# Patient Record
Sex: Male | Born: 1960
Health system: Southern US, Community
[De-identification: ages and names within clinical notes are randomized; demographics above are authoritative.]

## PROBLEM LIST (undated history)

## (undated) DIAGNOSIS — F32A Depression, unspecified: Secondary | ICD-10-CM

## (undated) DIAGNOSIS — K449 Diaphragmatic hernia without obstruction or gangrene: Secondary | ICD-10-CM

## (undated) DIAGNOSIS — N200 Calculus of kidney: Secondary | ICD-10-CM

## (undated) DIAGNOSIS — Z9989 Dependence on other enabling machines and devices: Secondary | ICD-10-CM

## (undated) DIAGNOSIS — K5790 Diverticulosis of intestine, part unspecified, without perforation or abscess without bleeding: Secondary | ICD-10-CM

## (undated) DIAGNOSIS — K219 Gastro-esophageal reflux disease without esophagitis: Secondary | ICD-10-CM

## (undated) DIAGNOSIS — D500 Iron deficiency anemia secondary to blood loss (chronic): Secondary | ICD-10-CM

## (undated) DIAGNOSIS — T753XXA Motion sickness, initial encounter: Secondary | ICD-10-CM

## (undated) DIAGNOSIS — Z973 Presence of spectacles and contact lenses: Secondary | ICD-10-CM

## (undated) DIAGNOSIS — Z860100 Personal history of colon polyps, unspecified: Secondary | ICD-10-CM

## (undated) DIAGNOSIS — K648 Other hemorrhoids: Secondary | ICD-10-CM

## (undated) DIAGNOSIS — G4733 Obstructive sleep apnea (adult) (pediatric): Secondary | ICD-10-CM

## (undated) DIAGNOSIS — F411 Generalized anxiety disorder: Secondary | ICD-10-CM

## (undated) DIAGNOSIS — I10 Essential (primary) hypertension: Secondary | ICD-10-CM

## (undated) DIAGNOSIS — I2510 Atherosclerotic heart disease of native coronary artery without angina pectoris: Secondary | ICD-10-CM

## (undated) DIAGNOSIS — IMO0002 Reserved for concepts with insufficient information to code with codable children: Secondary | ICD-10-CM

## (undated) DIAGNOSIS — N529 Male erectile dysfunction, unspecified: Secondary | ICD-10-CM

## (undated) DIAGNOSIS — E119 Type 2 diabetes mellitus without complications: Secondary | ICD-10-CM

## (undated) DIAGNOSIS — K2270 Barrett's esophagus without dysplasia: Secondary | ICD-10-CM

## (undated) DIAGNOSIS — E785 Hyperlipidemia, unspecified: Secondary | ICD-10-CM

## (undated) DIAGNOSIS — M1990 Unspecified osteoarthritis, unspecified site: Secondary | ICD-10-CM

## (undated) DIAGNOSIS — F329 Major depressive disorder, single episode, unspecified: Secondary | ICD-10-CM

## (undated) HISTORY — DX: Iron deficiency anemia secondary to blood loss (chronic): D50.0

## (undated) HISTORY — DX: Other hemorrhoids: K64.8

## (undated) HISTORY — DX: Depression, unspecified: F32.A

## (undated) HISTORY — DX: Barrett's esophagus without dysplasia: K22.70

## (undated) HISTORY — DX: Reserved for concepts with insufficient information to code with codable children: IMO0002

## (undated) HISTORY — DX: Essential (primary) hypertension: I10

## (undated) HISTORY — DX: Diverticulosis of intestine, part unspecified, without perforation or abscess without bleeding: K57.90

## (undated) HISTORY — PX: CARDIAC CATHETERIZATION: SHX172

## (undated) HISTORY — DX: Obstructive sleep apnea (adult) (pediatric): G47.33

## (undated) HISTORY — DX: Dependence on other enabling machines and devices: Z99.89

## (undated) HISTORY — PX: WISDOM TOOTH EXTRACTION: SHX21

## (undated) HISTORY — DX: Generalized anxiety disorder: F41.1

## (undated) HISTORY — DX: Diaphragmatic hernia without obstruction or gangrene: K44.9

## (undated) HISTORY — DX: Major depressive disorder, single episode, unspecified: F32.9

## (undated) HISTORY — DX: Personal history of colon polyps, unspecified: Z86.0100

## (undated) HISTORY — DX: Calculus of kidney: N20.0

## (undated) HISTORY — DX: Personal history of colonic polyps: Z86.010

## (undated) HISTORY — DX: Male erectile dysfunction, unspecified: N52.9

## (undated) HISTORY — DX: Unspecified osteoarthritis, unspecified site: M19.90

---

## 1995-09-16 HISTORY — PX: OTHER SURGICAL HISTORY: SHX169

## 2005-06-12 ENCOUNTER — Ambulatory Visit: Payer: Self-pay

## 2007-09-16 HISTORY — PX: COLONOSCOPY W/ POLYPECTOMY: SHX1380

## 2007-09-16 LAB — HM COLONOSCOPY

## 2008-10-05 ENCOUNTER — Ambulatory Visit: Payer: Self-pay | Admitting: Gastroenterology

## 2008-10-27 ENCOUNTER — Ambulatory Visit: Payer: Self-pay | Admitting: Family Medicine

## 2009-08-02 ENCOUNTER — Ambulatory Visit: Payer: Self-pay | Admitting: Family Medicine

## 2009-09-15 HISTORY — PX: OTHER SURGICAL HISTORY: SHX169

## 2009-11-20 ENCOUNTER — Ambulatory Visit: Payer: Self-pay | Admitting: Family Medicine

## 2010-05-06 ENCOUNTER — Ambulatory Visit: Payer: Self-pay

## 2010-05-17 ENCOUNTER — Ambulatory Visit: Payer: Self-pay | Admitting: Unknown Physician Specialty

## 2010-05-24 ENCOUNTER — Ambulatory Visit: Payer: Self-pay | Admitting: Unknown Physician Specialty

## 2010-08-05 ENCOUNTER — Ambulatory Visit: Payer: Self-pay

## 2011-09-16 LAB — HM DIABETES EYE EXAM

## 2011-12-30 ENCOUNTER — Other Ambulatory Visit: Payer: Self-pay | Admitting: Cardiovascular Disease

## 2012-01-01 ENCOUNTER — Other Ambulatory Visit: Payer: Self-pay | Admitting: Cardiovascular Disease

## 2012-01-01 ENCOUNTER — Encounter (HOSPITAL_COMMUNITY): Payer: Self-pay | Admitting: Pharmacy Technician

## 2012-01-02 ENCOUNTER — Inpatient Hospital Stay (HOSPITAL_COMMUNITY): Payer: BC Managed Care – PPO

## 2012-01-02 ENCOUNTER — Other Ambulatory Visit: Payer: Self-pay

## 2012-01-02 ENCOUNTER — Encounter (HOSPITAL_COMMUNITY): Payer: BC Managed Care – PPO

## 2012-01-02 ENCOUNTER — Encounter (HOSPITAL_COMMUNITY): Payer: Self-pay | Admitting: Cardiology

## 2012-01-02 ENCOUNTER — Encounter (HOSPITAL_COMMUNITY)
Admission: RE | Disposition: A | Discharge status: Home / Self Care | Payer: Self-pay | Source: Ambulatory Visit | Attending: Thoracic Surgery (Cardiothoracic Vascular Surgery)

## 2012-01-02 ENCOUNTER — Inpatient Hospital Stay (HOSPITAL_COMMUNITY)
Admission: RE | Admit: 2012-01-02 | Discharge: 2012-01-09 | DRG: 107 | Disposition: A | Discharge status: Home / Self Care | Payer: BC Managed Care – PPO | Source: Ambulatory Visit | Attending: Thoracic Surgery (Cardiothoracic Vascular Surgery) | Admitting: Thoracic Surgery (Cardiothoracic Vascular Surgery)

## 2012-01-02 DIAGNOSIS — Z87891 Personal history of nicotine dependence: Secondary | ICD-10-CM

## 2012-01-02 DIAGNOSIS — I200 Unstable angina: Secondary | ICD-10-CM | POA: Diagnosis present

## 2012-01-02 DIAGNOSIS — IMO0001 Reserved for inherently not codable concepts without codable children: Secondary | ICD-10-CM | POA: Diagnosis present

## 2012-01-02 DIAGNOSIS — I2510 Atherosclerotic heart disease of native coronary artery without angina pectoris: Secondary | ICD-10-CM

## 2012-01-02 DIAGNOSIS — D62 Acute posthemorrhagic anemia: Secondary | ICD-10-CM | POA: Diagnosis not present

## 2012-01-02 DIAGNOSIS — J9819 Other pulmonary collapse: Secondary | ICD-10-CM | POA: Diagnosis present

## 2012-01-02 DIAGNOSIS — E785 Hyperlipidemia, unspecified: Secondary | ICD-10-CM | POA: Diagnosis present

## 2012-01-02 DIAGNOSIS — Z01810 Encounter for preprocedural cardiovascular examination: Secondary | ICD-10-CM

## 2012-01-02 DIAGNOSIS — Z951 Presence of aortocoronary bypass graft: Secondary | ICD-10-CM

## 2012-01-02 DIAGNOSIS — E119 Type 2 diabetes mellitus without complications: Secondary | ICD-10-CM | POA: Diagnosis present

## 2012-01-02 DIAGNOSIS — R9439 Abnormal result of other cardiovascular function study: Secondary | ICD-10-CM | POA: Diagnosis present

## 2012-01-02 HISTORY — DX: Hyperlipidemia, unspecified: E78.5

## 2012-01-02 HISTORY — DX: Gastro-esophageal reflux disease without esophagitis: K21.9

## 2012-01-02 HISTORY — PX: LEFT HEART CATHETERIZATION WITH CORONARY ANGIOGRAM: SHX5451

## 2012-01-02 HISTORY — DX: Type 2 diabetes mellitus without complications: E11.9

## 2012-01-02 HISTORY — DX: Atherosclerotic heart disease of native coronary artery without angina pectoris: I25.10

## 2012-01-02 LAB — PULMONARY FUNCTION TEST

## 2012-01-02 LAB — HEPARIN LEVEL (UNFRACTIONATED): Heparin Unfractionated: 0.11 IU/mL — ABNORMAL LOW (ref 0.30–0.70)

## 2012-01-02 LAB — CBC
Hemoglobin: 11.7 g/dL — ABNORMAL LOW (ref 13.0–17.0)
RBC: 4.95 MIL/uL (ref 4.22–5.81)

## 2012-01-02 LAB — BASIC METABOLIC PANEL
CO2: 25 mEq/L (ref 19–32)
Chloride: 101 mEq/L (ref 96–112)
Glucose, Bld: 134 mg/dL — ABNORMAL HIGH (ref 70–99)
Potassium: 4.5 mEq/L (ref 3.5–5.1)
Sodium: 138 mEq/L (ref 135–145)

## 2012-01-02 LAB — GLUCOSE, CAPILLARY: Glucose-Capillary: 121 mg/dL — ABNORMAL HIGH (ref 70–99)

## 2012-01-02 LAB — PROTIME-INR: INR: 0.98 (ref 0.00–1.49)

## 2012-01-02 SURGERY — LEFT HEART CATHETERIZATION WITH CORONARY ANGIOGRAM
Anesthesia: LOCAL

## 2012-01-02 MED ORDER — PANTOPRAZOLE SODIUM 40 MG PO TBEC
40.0000 mg | DELAYED_RELEASE_TABLET | Freq: Every day | ORAL | Status: DC
  Administered 2012-01-02 – 2012-01-04 (×3): 40 mg via ORAL
  Filled 2012-01-02 (×2): qty 1

## 2012-01-02 MED ORDER — VENLAFAXINE HCL ER 150 MG PO CP24
150.0000 mg | ORAL_CAPSULE | Freq: Every day | ORAL | Status: DC
  Administered 2012-01-02 – 2012-01-04 (×3): 150 mg via ORAL
  Filled 2012-01-02 (×4): qty 1

## 2012-01-02 MED ORDER — DIAZEPAM 5 MG PO TABS
5.0000 mg | ORAL_TABLET | ORAL | Status: AC
  Administered 2012-01-02: 5 mg via ORAL
  Filled 2012-01-02: qty 1

## 2012-01-02 MED ORDER — ASPIRIN EC 325 MG PO TBEC
325.0000 mg | DELAYED_RELEASE_TABLET | Freq: Every day | ORAL | Status: DC
  Administered 2012-01-02 – 2012-01-04 (×3): 325 mg via ORAL
  Filled 2012-01-02 (×3): qty 1

## 2012-01-02 MED ORDER — FENTANYL CITRATE 0.05 MG/ML IJ SOLN
INTRAMUSCULAR | Status: AC
  Filled 2012-01-02: qty 2

## 2012-01-02 MED ORDER — ATORVASTATIN CALCIUM 40 MG PO TABS
40.0000 mg | ORAL_TABLET | Freq: Every day | ORAL | Status: DC
  Filled 2012-01-02: qty 1

## 2012-01-02 MED ORDER — ISOSORBIDE MONONITRATE ER 30 MG PO TB24
30.0000 mg | ORAL_TABLET | Freq: Every day | ORAL | Status: DC
  Administered 2012-01-02: 30 mg via ORAL
  Filled 2012-01-02 (×2): qty 1

## 2012-01-02 MED ORDER — HEPARIN (PORCINE) IN NACL 2-0.9 UNIT/ML-% IJ SOLN
INTRAMUSCULAR | Status: AC
  Filled 2012-01-02: qty 2000

## 2012-01-02 MED ORDER — CLONAZEPAM 1 MG PO TABS
1.0000 mg | ORAL_TABLET | Freq: Two times a day (BID) | ORAL | Status: DC
  Administered 2012-01-02 – 2012-01-04 (×6): 1 mg via ORAL
  Filled 2012-01-02 (×6): qty 1

## 2012-01-02 MED ORDER — LIDOCAINE HCL (PF) 1 % IJ SOLN
INTRAMUSCULAR | Status: AC
  Filled 2012-01-02: qty 30

## 2012-01-02 MED ORDER — ACETAMINOPHEN 325 MG PO TABS: 650.0000 mg | ORAL_TABLET | ORAL | Status: DC | PRN

## 2012-01-02 MED ORDER — METFORMIN HCL 500 MG PO TABS: 1000.0000 mg | ORAL_TABLET | Freq: Two times a day (BID) | ORAL | Status: DC

## 2012-01-02 MED ORDER — ROSUVASTATIN CALCIUM 20 MG PO TABS
20.0000 mg | ORAL_TABLET | Freq: Every day | ORAL | Status: DC
  Administered 2012-01-02: 20 mg via ORAL
  Filled 2012-01-02 (×2): qty 1

## 2012-01-02 MED ORDER — ALBUTEROL SULFATE (5 MG/ML) 0.5% IN NEBU
2.5000 mg | INHALATION_SOLUTION | Freq: Once | RESPIRATORY_TRACT | Status: AC
  Administered 2012-01-02: 2.5 mg via RESPIRATORY_TRACT

## 2012-01-02 MED ORDER — METOPROLOL SUCCINATE ER 25 MG PO TB24
25.0000 mg | ORAL_TABLET | Freq: Every day | ORAL | Status: DC
  Administered 2012-01-02: 25 mg via ORAL
  Filled 2012-01-02 (×2): qty 1

## 2012-01-02 MED ORDER — MIDAZOLAM HCL 2 MG/2ML IJ SOLN
INTRAMUSCULAR | Status: AC
  Filled 2012-01-02: qty 2

## 2012-01-02 MED ORDER — ASPIRIN 81 MG PO CHEW
324.0000 mg | CHEWABLE_TABLET | ORAL | Status: AC
  Administered 2012-01-02: 324 mg via ORAL
  Filled 2012-01-02: qty 4

## 2012-01-02 MED ORDER — INSULIN ASPART 100 UNIT/ML ~~LOC~~ SOLN
0.0000 [IU] | Freq: Three times a day (TID) | SUBCUTANEOUS | Status: DC
Start: 2012-01-02 — End: 2012-01-05
  Administered 2012-01-03: 1 [IU] via SUBCUTANEOUS

## 2012-01-02 MED ORDER — SODIUM CHLORIDE 0.9 % IV SOLN: INTRAVENOUS | Status: DC

## 2012-01-02 MED ORDER — ONDANSETRON HCL 4 MG/2ML IJ SOLN: 4.0000 mg | Freq: Four times a day (QID) | INTRAMUSCULAR | Status: DC | PRN

## 2012-01-02 MED ORDER — METOPROLOL TARTRATE 25 MG PO TABS: 25.0000 mg | ORAL_TABLET | Freq: Two times a day (BID) | ORAL | Status: DC

## 2012-01-02 MED ORDER — HEPARIN (PORCINE) IN NACL 100-0.45 UNIT/ML-% IJ SOLN
1400.0000 [IU]/h | INTRAMUSCULAR | Status: AC
  Administered 2012-01-02: 1000 [IU]/h via INTRAVENOUS
  Administered 2012-01-03: 1300 [IU]/h via INTRAVENOUS
  Administered 2012-01-03 – 2012-01-04 (×2): 1400 [IU]/h via INTRAVENOUS
  Filled 2012-01-02 (×4): qty 250

## 2012-01-02 MED ORDER — SODIUM CHLORIDE 0.9 % IV SOLN: 250.0000 mL | INTRAVENOUS | Status: DC | PRN

## 2012-01-02 MED ORDER — SODIUM CHLORIDE 0.9 % IJ SOLN: 3.0000 mL | INTRAMUSCULAR | Status: DC | PRN

## 2012-01-02 MED ORDER — SODIUM CHLORIDE 0.9 % IJ SOLN: 3.0000 mL | Freq: Two times a day (BID) | INTRAMUSCULAR | Status: DC

## 2012-01-02 MED ORDER — NITROGLYCERIN 0.2 MG/ML ON CALL CATH LAB
INTRAVENOUS | Status: AC
  Filled 2012-01-02: qty 1

## 2012-01-02 MED ORDER — SODIUM CHLORIDE 0.9 % IV SOLN
INTRAVENOUS | Status: DC
  Administered 2012-01-02: 08:00:00 via INTRAVENOUS

## 2012-01-02 MED ORDER — VITAMIN D3 25 MCG (1000 UNIT) PO TABS
1000.0000 [IU] | ORAL_TABLET | Freq: Every day | ORAL | Status: DC
  Administered 2012-01-02 – 2012-01-04 (×3): 1000 [IU] via ORAL
  Filled 2012-01-02 (×4): qty 1

## 2012-01-02 MED ORDER — GEMFIBROZIL 600 MG PO TABS
600.0000 mg | ORAL_TABLET | Freq: Two times a day (BID) | ORAL | Status: DC
  Administered 2012-01-02 – 2012-01-03 (×2): 600 mg via ORAL
  Filled 2012-01-02 (×4): qty 1

## 2012-01-02 NOTE — Cardiovascular Report (Signed)
NAMEMarland Kitchen  Joseph Terry, Joseph Terry NO.:  [id number]  MEDICAL RECORD NO.:  [id number]  LOCATION:  3708                         FACILITY:  [facility]  PHYSICIAN:  Nicki Guadalajara, M.D.     DATE OF BIRTH:  05/12/1961  DATE OF PROCEDURE: DATE OF DISCHARGE:                           CARDIAC CATHETERIZATION   INDICATIONS:  Joseph Terry is a 51 year old gentleman who has a long-standing tobacco history.  He had recently developed exertional neck discomfort.  He underwent a nuclear perfusion study, which was abnormal with the development of 3-4 mm of ST-segment depression in inferior ischemia.  He has been evaluated by Dr. Alanda Amass and scheduled for cardiac catheterization today.  PROCEDURE:  After premedication with Versed 2 mg plus fentanyl 50 mcg, the patient was prepped and draped in usual fashion.  His right femoral artery is punctured anteriorly and a 5-French sheath was inserted. Diagnostic cardiac catheterization was done utilizing 5-French Judkins 4 left and right coronary catheters.  The right catheter was used for selective angiography into the left subclavian internal mammary artery since the patient will need CABG surgery.  A 5-French pigtail catheter was used for RAO ventriculography as well as distal aortography. Hemostasis was obtained by direct manual pressure.  He tolerated the procedure well.  HEMODYNAMIC DATA:  Central aortic pressure 115/60, left ventricle pressure 115/6.  ANGIOGRAPHIC DATA:  The coronary arteries were extensively calcified.  The left main coronary artery had 30% distal narrowing and trifurcated into an LAD, a small intermediate vessel and left circumflex system.  The ostium of the LAD had tubular 70% stenosis arising from the left main.  After the small first diagonal vessel, there was 70% narrowing followed by an aneurysmal dilatation of the LAD proximal to the 2nd diagonal vessel.  The 2nd diagonal vessel had 95% proximal stenosis  and the LAD was aneurysmal just after this diagonal takeoff in the region of the septal perforating artery.  There was 60-70% narrowing in the LAD beyond the 2nd diagonal vessel as well as 60% narrowing after the 3rd diagonal vessel in the mid LAD.  The distal LAD wraps around the apex.  The intermediate vessel had 80% near ostial stenosis.  There was a small caliber.  The circumflex vessel had tubular 50-60% proximal stenosis.  There was 50% stenosis in the OM-1 vessel.  The right coronary artery was a large caliber dominant vessel that was calcified.  There was 95% proximal stenosis on the proximal bend.  There was then diffuse 80% stenosis in the proximal to mid segment followed 90% mid stenosis.  There was 80-90% acute marginal stenosis proximally followed by 50% distal RCA stenosis before the distal branches.  The left subclavian internal mammary artery were normal and suitable for CABG surgery.  Left ventriculography revealed left ventricular hypertrophy with normal systolic function without focal segmental wall motion abnormalities.  Distal aortography revealed patent renal arteries without significant aortoiliac disease.  IMPRESSION: 1. Mild left ventricular hypertrophy with normal systolic function     without focal segmental wall motion abnormality and ejection     fraction of 55%. 2. Significant coronary calcification with severe multivessel CAD with     30% distal left main  stenosis, 70% tubular ostial LAD stenosis with     aneurysmal dilatation of the LAD proximal and distal to the 2nd     diagonal vessel with 70% stenoses, the 2nd diagonal vessel had 95%     stenosis.  The mid LAD had 60-70% stenosis, and after the 3rd     diagonal branch, had another diffuse area of 60% stenosis.  Next,     80% ostial ramus intermedius stenosis.  Next, 50-60% proximal     circumflex stenosis with 50% OM-1 stenosis.  Next, 95% proximal     right coronary artery stenosis with  diffuse 80% proximal to mid     stenosis followed by 90% mid stenosis with 80-90% stenosis in the     origin of acute marginal PDA-like branch followed by 50% distal RCA     stenosis.  Next, normal aortoiliac system.  RECOMMENDATION:  CABG revascularization surgery.          ______________________________ Nicki Guadalajara, M.D.     TK/MEDQ  D:  01/02/2012  T:  01/02/2012  Job:  [id number]  cc:   Gerlene Burdock A. Alanda Amass, M.D. Nilda Simmer, M.D.

## 2012-01-02 NOTE — Consult Note (Signed)
CARDIOTHORACIC SURGERY CONSULTATION REPORT  PCP is SMITH,KRISTI, MD, MD Referring Provider is [name], MD Primary Cardiologist is Susa Griffins, MD   Reason for consultation:  Severe 3-vessel CAD  HPI:  Patient is a 51 year old divorced white male from [place] with no previous history of coronary artery disease but risk factors notable for history of type 2 diabetes mellitus, hyperlipidemia, and long-standing tobacco abuse. The patient states that over the last 2 or 3 years he's noticed some mild progression of fatigue and decreased exercise tolerance. Last summer during hot weather he noted that he would get some vague discomfort in the left side of his neck when he was out mowing the yard. He also has noted some very mild exertional shortness of breath. He's never had any significant chest pain. He discussed these findings with his primary care physician and was referred for a stress test and cardiology evaluation. He saw Dr. Alanda Amass in the office and underwent nuclear stress test that was felt to be high risk for ischemia. The patient was brought in for elective cardiac catheterization today by Dr. Tresa Endo. This reveals severe three-vessel coronary artery disease with normal left ventricular function. The patient was admitted to the hospital following catheterization and her thoracic surgical consultation was requested.  Past Medical History  Diagnosis Date  . Unstable angina 01/02/2012  . Cardiovascular stress test abnormal 01/02/2012  . CAD (coronary artery disease), severe multivessel CAD surgical consult for CABG 01/02/2012  . Type II diabetes mellitus   . Hyperlipidemia   . Tobacco abuse     Past Surgical History  Procedure Date  . Shoulder surgery     No family history on file.  Social History History  Substance Use Topics  . Smoking status: Former Smoker -- 1.5 packs/day for 35 years    Types: Cigarettes    Quit date: 11/14/2011  . Smokeless  tobacco: Not on file  . Alcohol Use: Not on file    Prior to Admission medications   Medication Sig Start Date End Date Taking? Authorizing Provider  cholecalciferol (VITAMIN D) 1000 UNITS tablet Take 1,000 Units by mouth daily.   Yes Historical Provider, MD  clonazePAM (KLONOPIN) 1 MG tablet Take 1 mg by mouth 2 (two) times daily.   Yes Historical Provider, MD  esomeprazole (NEXIUM) 40 MG capsule Take 40 mg by mouth daily before breakfast.   Yes Historical Provider, MD  gemfibrozil (LOPID) 600 MG tablet Take 600 mg by mouth 2 (two) times daily before a meal.   Yes Historical Provider, MD  metFORMIN (GLUCOPHAGE) 1000 MG tablet Take 1,000 mg by mouth 2 (two) times daily with a meal.   Yes Historical Provider, MD  pravastatin (PRAVACHOL) 40 MG tablet Take 40 mg by mouth daily.   Yes Historical Provider, MD  venlafaxine XR (EFFEXOR-XR) 150 MG 24 hr capsule Take 150 mg by mouth daily.   Yes Historical Provider, MD  metoprolol succinate (TOPROL-XL) 25 MG 24 hr tablet Take 25 mg by mouth daily.    Historical Provider, MD    Current Facility-Administered Medications  Medication Dose Route Frequency Provider Last Rate Last Dose  . 0.9 %  sodium chloride infusion   Intravenous Continuous [name], MD 150 mL/hr at 01/02/12 1045    . acetaminophen (TYLENOL) tablet 650 mg  650 mg Oral Q4H PRN [name], MD      . albuterol (PROVENTIL) (5 MG/ML) 0.5% nebulizer solution 2.5 mg  2.5  mg Nebulization Once Purcell Nails, MD   2.5 mg at 01/02/12 1538  . aspirin chewable tablet 324 mg  324 mg Oral Pre-Cath [name], MD   324 mg at 01/02/12 0824  . aspirin EC tablet 325 mg  325 mg Oral Daily [name], MD   325 mg at 01/02/12 1130  . cholecalciferol (VITAMIN D) tablet 1,000 Units  1,000 Units Oral Daily [name], MD   1,000 Units at 01/02/12 1130  . clonazePAM (KLONOPIN) tablet 1 mg  1 mg Oral BID [name], MD   1 mg at 01/02/12 1130  . diazepam (VALIUM) tablet 5 mg  5 mg Oral  On Call [name], MD   5 mg at 01/02/12 0824  . fentaNYL (SUBLIMAZE) 0.05 MG/ML injection           . gemfibrozil (LOPID) tablet 600 mg  600 mg Oral BID AC [name], MD   600 mg at 01/02/12 1704  . heparin 2-0.9 UNIT/ML-% infusion           . heparin ADULT infusion 100 units/mL (25000 units/250 mL)  1,000 Units/hr Intravenous Continuous Ann Held, PHARMD 10 mL/hr at 01/02/12 1704 1,000 Units/hr at 01/02/12 1704  . insulin aspart (novoLOG) injection 0-9 Units  0-9 Units Subcutaneous TID WC Nada Boozer, NP      . isosorbide mononitrate (IMDUR) 24 hr tablet 30 mg  30 mg Oral Daily [name], MD   30 mg at 01/02/12 1130  . lidocaine (XYLOCAINE) 1 % injection           . metoprolol succinate (TOPROL-XL) 24 hr tablet 25 mg  25 mg Oral Daily [name], MD   25 mg at 01/02/12 1130  . midazolam (VERSED) 2 MG/2ML injection           . nitroGLYCERIN (NTG ON-CALL) 0.2 mg/mL injection           . ondansetron (ZOFRAN) injection 4 mg  4 mg Intravenous Q6H PRN [name], MD      . pantoprazole (PROTONIX) EC tablet 40 mg  40 mg Oral Q1200 [name], MD   40 mg at 01/02/12 1130  . rosuvastatin (CRESTOR) tablet 20 mg  20 mg Oral q1800 [name], MD   20 mg at 01/02/12 1704  . venlafaxine XR (EFFEXOR-XR) 24 hr capsule 150 mg  150 mg Oral Daily [name], MD   150 mg at 01/02/12 1130  . DISCONTD: 0.9 %  sodium chloride infusion  250 mL Intravenous PRN [name], MD      . DISCONTD: 0.9 %  sodium chloride infusion   Intravenous Continuous [name], MD 75 mL/hr at 01/02/12 1610    . DISCONTD: atorvastatin (LIPITOR) tablet 40 mg  40 mg Oral q1800 [name], MD      . DISCONTD: metFORMIN (GLUCOPHAGE) tablet 1,000 mg  1,000 mg Oral BID WC [name], MD      . DISCONTD: metoprolol tartrate (LOPRESSOR) tablet 25 mg  25 mg Oral BID [name], MD      . DISCONTD: sodium chloride 0.9 % injection 3 mL  3 mL Intravenous Q12H [name], MD      .  DISCONTD: sodium chloride 0.9 % injection 3 mL  3 mL Intravenous PRN [name], MD        Allergies  Allergen Reactions  . Tramadol  Rash    Review of Systems:  General:  normal appetite, decreased energy   Respiratory:  no cough, no wheezing, no hemoptysis, no pain with inspiration or cough, no shortness of breath   Cardiac:   no chest pain or tightness, + mild exertional SOB, no resting SOB, no PND, no orthopnea, no LE edema, no palpitations, no syncope  GI:   no difficulty swallowing, chronic symptoms of reflux controlled with Nexium, no hematochezia, no hematemesis, no melena, no constipation, no diarrhea   GU:   no dysuria, no urgency, no frequency   Musculoskeletal: no arthritis, no arthralgia other than mild shoulder pain  Vascular:  no pain suggestive of claudication   Neuro:   no symptoms suggestive of TIA's, no seizures, no headaches, no peripheral neuropathy   Endocrine:  Negative, doesn't check CBG's at home  HEENT:  no loose teeth or painful teeth,  no recent vision changes  Psych:   no anxiety, no depression    Physical Exam:   BP 126/56  Pulse 61  Temp(Src) 96.9 F (36.1 C) (Oral)  Resp 20  Ht 5\' 11"  (1.803 m)  Wt 79.833 kg (176 lb)  BMI 24.55 kg/m2  SpO2 96%  General:  thin  well-appearing  HEENT:  Unremarkable   Neck:   no JVD, no bruits, no adenopathy   Chest:   clear to auscultation, symmetrical breath sounds, no wheezes, no rhonchi   CV:   RRR, no murmur   Abdomen:  soft, non-tender, no masses   Extremities:  warm, well-perfused, pulses palpable  Rectal/GU  Deferred  Neuro:   Grossly non-focal and symmetrical throughout  Skin:   Clean and dry, no rashes, no breakdown  Diagnostic Tests:  Cardiac catheterization performed today by Dr. Nicholaus Bloom is reviewed. There is severe three-vessel coronary artery disease with preserved left ventricular function. Specifically, there is diffuse coronary artery disease with visible plaque and calcification throughout  most of the epicardial coronary arteries. There is 30-40% distal stenosis of the left main coronary artery. There 60-70% ostial stenosis of left InterStim coronary artery with 70% proximal stenosis and 60-70% stenosis of the midportion of the vessel. There is 90% proximal stenosis of a medium to large size second diagonal branch. There is 50-60% tubular long segment stenosis of proximal left circumflex coronary artery with 50-70% proximal stenosis of a medium-sized bifurcating first obtuse marginal branch. There is 80% proximal stenosis of a medium-sized ramus intermediate branch. There is right dominant coronary circulation. There is diffuse 95% proximal stenosis of the right coronary artery with long segment 8% stenosis of the midportion of this vessel. Left ventricular function appears preserved with ejection fraction estimated 55% and no significant wall motion abnormalities.   Impression:  Severe three-vessel coronary artery disease with preserved left ventricular function. The patient has mild symptoms of exertional fatigue and shortness of breath. He has had some exercise-induced discomfort along the left side of his neck in the past. He reports that he had some mild chest discomfort yesterday when he was thinking about having the cath today but otherwise she's never had any chest pain. He has diffuse coronary artery disease throughout all of his epicardial coronary vessels. He has long-standing tobacco abuse and type 2 diabetes mellitus, but overall the patient appears to be a good candidate for surgical revascularization. I agree that he would best be treated with coronary artery bypass grafting.    Plan:  I've discussed the indications, risks, and potential benefits of coronary artery bypass grafting with  the patient and his family this evening. Alternative treatment strategies been discussed. The patient understands and accepts all potential associated risks of surgery including but not limited  to risk of death, stroke, myocardial infarction, congestive heart failure, respiratory failure, renal failure, bleeding requiring blood transfusion, arrhythmia, infection, and late recurrence of coronary artery disease. We discussed how important it will remain for him to continue to abstain from tobacco use. We also discussed how important will be for him to bring his diabetes and hyperlipidemia under tight long-term control. All of his questions been addressed. At this point the next open available slot for elective coronary artery bypass grafting in the operating room would be next Wednesday by one of my partners. We will continue to follow along and make an effort to schedule surgery as soon as practical. If the patient is to be discharged from the hospital between now and the time of surgery please contact us as soon as practical.    Salvatore Decent. Cornelius Moras, MD 01/02/2012 6:44 PM

## 2012-01-02 NOTE — H&P (Signed)
  Updated H&P: See complete dictated notes by Dr. Alanda Amass from 12/10/11 and 12/30/11.  Pt now admitted for cath following an abnormal nuclear perfusion scan suggesting inferior ischemia.  No change in PEx.  Labs reviewed. Cath and possible PCI discussed with pt and family.  Plan this am.

## 2012-01-02 NOTE — Progress Notes (Addendum)
Pre-op Cardiac Surgery  Carotid Findings:  Bilaterally no significant ICA stenosis with antegrade vertebral flow.   Upper Extremity Right Left  Brachial Pressures 105 102  Radial Waveforms Tri Tri  Ulnar Waveforms Tri Tri  Palmar Arch (Allen's Test) Normal Normal with radial compression, >50% with ulnar compression     Lower  Extremity Right Left  Dorsalis Pedis 92, Bi 117, Bi  Anterior Tibial    Posterior Tibial 122, Bi 123, Bi  Ankle/Brachial Indices 1.16 1.17    Findings:  Bilateral normal ABIs  Farrel Demark, RDMS

## 2012-01-02 NOTE — Progress Notes (Signed)
ANTICOAGULATION CONSULT NOTE - Initial Consult  Pharmacy Consult for Heparin Indication: Multivessel CAD awaiting CABG  Allergies  Allergen Reactions  . Tramadol Rash    Patient Measurements: Height: 5\' 11"  (180.3 cm) Weight: 176 lb (79.833 kg) IBW/kg (Calculated) : 75.3  Heparin Dosing Weight: 79.8 kg  Vital Signs: Temp: 96.9 F (36.1 C) (04/19 0723) Temp src: Oral (04/19 0723) BP: 135/79 mmHg (04/19 1110) Pulse Rate: 53  (04/19 1110)  Labs:  Basename 01/02/12 0740  HGB 11.7*  HCT 36.4*  PLT 407*  APTT --  LABPROT 13.2  INR 0.98  HEPARINUNFRC --  CREATININE 0.63  CKTOTAL --  CKMB --  TROPONINI --   Estimated Creatinine Clearance: 115 ml/min (by C-G formula based on Cr of 0.63).  Medical History: No past medical history on file.  Assessment: 51 y.o. M found to have severe multivessel CAD in cath procedure today and is now to start heparin 6 hours post sheath removal for anticoagulation while awaiting CABG plans. Sheath was removed at 1020 -- no bleeding or hematoma noted at that time. Heparin dosing wt: 80 kg, Hgb/Hct/Plt ok, SCr: 0.63, CrCl~90-100 ml/min.   Goal of Therapy:  Heparin level 0.3-0.7 units/ml   Plan:  1. No heparin bolus 2. Initiate heparin drip at rate of 1000 units/hr (10 ml/hr) at 1630 today 3. Will continue to monitor for any signs/symptoms of bleeding and will follow up with heparin level in 6 hours   Georgina Pillion, PharmD, BCPS Clinical Pharmacist Pager: [phone] 01/02/2012 11:19 AM

## 2012-01-02 NOTE — Progress Notes (Signed)
ANTICOAGULATION CONSULT NOTE   Pharmacy Consult for Heparin Indication: Multivessel CAD awaiting CABG  Allergies  Allergen Reactions  . Tramadol Rash    Patient Measurements: Height: 5\' 11"  (180.3 cm) Weight: 176 lb (79.833 kg) IBW/kg (Calculated) : 75.3  Heparin Dosing Weight: 79.8 kg  Vital Signs: Temp: 97.7 F (36.5 C) (04/19 2100) Temp src: Oral (04/19 2100) BP: 106/51 mmHg (04/19 2100) Pulse Rate: 61  (04/19 2100)  Labs:  Basename 01/02/12 2248 01/02/12 0740  HGB -- 11.7*  HCT -- 36.4*  PLT -- 407*  APTT -- --  LABPROT -- 13.2  INR -- 0.98  HEPARINUNFRC 0.11* --  CREATININE -- 0.63  CKTOTAL -- --  CKMB -- --  TROPONINI -- --   Estimated Creatinine Clearance: 115 ml/min (by C-G formula based on Cr of 0.63).  Assessment: 51 y.o. Male with severe multivessel CAD, awaiting CABG, for Heparin.   Goal of Therapy:  Heparin level 0.3-0.7 units/ml   Plan:  Increase Heparin 1300 units/hr Follow-up am labs.  01/02/2012 11:59 PM

## 2012-01-02 NOTE — Progress Notes (Signed)
PFT completed at bedside. Unconfirmed results in Progress Notes of [facility].

## 2012-01-02 NOTE — CV Procedure (Signed)
Cardiac Catherization  Joseph Terry, 51 y.o., male  Full note dictated;  See diagram in chart.  DICTATION # [id number], [id number]  Severe multivessel CAD; Normal LV Fxn Normal Aortoiliac system  Rec:  CABG  Will admit for anticoagulation until CABG.   [name], MD, [facility] 01/02/2012 9:56 AM

## 2012-01-02 NOTE — Progress Notes (Signed)
PHARMACIST - PHYSICIAN COMMUNICATION DR:  Tresa Endo CONCERNING:  METFORMIN SAFE ADMINISTRATION POLICY  RECOMMENDATION: Metformin has been placed on DISCONTINUE (rejected order) STATUS and should be reordered only after any of the conditions below are ruled out.  Current safety recommendations include avoiding metformin for a minimum of 48 hours after the patient's exposure to intravenous contrast media.  DESCRIPTION:  The Pharmacy Committee has adopted a policy that restricts the use of metformin in hospitalized patients until all the contraindications to administration have been ruled out. Specific contraindications are: []  Serum creatinine ? 1.5 for males []  Serum creatinine ? 1.4 for females []  Shock, acute MI, sepsis, hypoxemia, dehydration [x]  Planned administration of intravenous iodinated contrast media []  Heart Failure patients with low EF []  Acute or chronic metabolic acidosis (including DKA)     The patient received Omnipaque with cath procedure on 4/19 -- you may consider re-ordering after the patient is 48 hours post cath procedure.   Thanks! Georgina Pillion, PharmD, BCPS Clinical Pharmacist Pager: [phone] 01/02/2012 11:33 AM

## 2012-01-03 ENCOUNTER — Inpatient Hospital Stay (HOSPITAL_COMMUNITY): Payer: BC Managed Care – PPO

## 2012-01-03 DIAGNOSIS — I2510 Atherosclerotic heart disease of native coronary artery without angina pectoris: Secondary | ICD-10-CM

## 2012-01-03 DIAGNOSIS — Z01810 Encounter for preprocedural cardiovascular examination: Secondary | ICD-10-CM

## 2012-01-03 LAB — BLOOD GAS, ARTERIAL
Drawn by: 35135
pCO2 arterial: 39.1 mmHg (ref 35.0–45.0)
pH, Arterial: 7.413 (ref 7.350–7.450)
pO2, Arterial: 84.9 mmHg (ref 80.0–100.0)

## 2012-01-03 LAB — CBC
HCT: 34 % — ABNORMAL LOW (ref 39.0–52.0)
Hemoglobin: 10.8 g/dL — ABNORMAL LOW (ref 13.0–17.0)
MCH: 23.8 pg — ABNORMAL LOW (ref 26.0–34.0)
MCHC: 31.8 g/dL (ref 30.0–36.0)
MCV: 75.1 fL — ABNORMAL LOW (ref 78.0–100.0)
Platelets: 361 10*3/uL (ref 150–400)
RBC: 4.53 MIL/uL (ref 4.22–5.81)
RDW: 16.5 % — ABNORMAL HIGH (ref 11.5–15.5)
WBC: 7.7 10*3/uL (ref 4.0–10.5)

## 2012-01-03 LAB — URINALYSIS, ROUTINE W REFLEX MICROSCOPIC
Bilirubin Urine: NEGATIVE
Glucose, UA: NEGATIVE mg/dL
Hgb urine dipstick: NEGATIVE
Ketones, ur: NEGATIVE mg/dL
Leukocytes, UA: NEGATIVE
Nitrite: NEGATIVE
Protein, ur: NEGATIVE mg/dL
Specific Gravity, Urine: 1.01 (ref 1.005–1.030)
Urobilinogen, UA: 0.2 mg/dL (ref 0.0–1.0)
pH: 6.5 (ref 5.0–8.0)

## 2012-01-03 LAB — LIPID PANEL
Cholesterol: 149 mg/dL (ref 0–200)
HDL: 35 mg/dL — ABNORMAL LOW (ref >39)
LDL Cholesterol: 93 mg/dL (ref 0–99)
Total CHOL/HDL Ratio: 4.3 RATIO
Triglycerides: 106 mg/dL (ref <150)
VLDL: 21 mg/dL (ref 0–40)

## 2012-01-03 LAB — COMPREHENSIVE METABOLIC PANEL
ALT: 19 U/L (ref 0–53)
AST: 22 U/L (ref 0–37)
Albumin: 3.9 g/dL (ref 3.5–5.2)
Alkaline Phosphatase: 110 U/L (ref 39–117)
BUN: 11 mg/dL (ref 6–23)
CO2: 26 mEq/L (ref 19–32)
Calcium: 9.3 mg/dL (ref 8.4–10.5)
Chloride: 104 mEq/L (ref 96–112)
Creatinine, Ser: 0.71 mg/dL (ref 0.50–1.35)
GFR calc Af Amer: >90 mL/min (ref >90)
GFR calc non Af Amer: >90 mL/min (ref >90)
Glucose, Bld: 121 mg/dL — ABNORMAL HIGH (ref 70–99)
Potassium: 4.3 mEq/L (ref 3.5–5.1)
Sodium: 139 mEq/L (ref 135–145)
Total Bilirubin: 0.2 mg/dL — ABNORMAL LOW (ref 0.3–1.2)
Total Protein: 6.8 g/dL (ref 6.0–8.3)

## 2012-01-03 LAB — HEMOGLOBIN A1C
Hgb A1c MFr Bld: 6.4 % — ABNORMAL HIGH (ref <5.7)
Mean Plasma Glucose: 137 mg/dL — ABNORMAL HIGH (ref <117)

## 2012-01-03 LAB — GLUCOSE, CAPILLARY: Glucose-Capillary: 103 mg/dL — ABNORMAL HIGH (ref 70–99)

## 2012-01-03 LAB — PROTIME-INR: INR: 1.04 (ref 0.00–1.49)

## 2012-01-03 LAB — HEPARIN LEVEL (UNFRACTIONATED): Heparin Unfractionated: 0.32 IU/mL (ref 0.30–0.70)

## 2012-01-03 MED ORDER — CHLORHEXIDINE GLUCONATE 4 % EX LIQD
60.0000 mL | Freq: Once | CUTANEOUS | Status: AC
  Administered 2012-01-04: 4 via TOPICAL
  Filled 2012-01-03: qty 60

## 2012-01-03 MED ORDER — TEMAZEPAM 15 MG PO CAPS: 15.0000 mg | ORAL_CAPSULE | Freq: Once | ORAL | Status: AC | PRN

## 2012-01-03 MED ORDER — ROSUVASTATIN CALCIUM 40 MG PO TABS
40.0000 mg | ORAL_TABLET | Freq: Every day | ORAL | Status: DC
  Administered 2012-01-03 – 2012-01-04 (×2): 40 mg via ORAL
  Filled 2012-01-03 (×3): qty 1

## 2012-01-03 MED ORDER — BISACODYL 5 MG PO TBEC
5.0000 mg | DELAYED_RELEASE_TABLET | Freq: Once | ORAL | Status: AC
  Administered 2012-01-04: 5 mg via ORAL
  Filled 2012-01-03: qty 1

## 2012-01-03 MED ORDER — CHLORHEXIDINE GLUCONATE 4 % EX LIQD
60.0000 mL | Freq: Once | CUTANEOUS | Status: AC
  Administered 2012-01-05: 4 via TOPICAL
  Filled 2012-01-03: qty 60

## 2012-01-03 MED ORDER — ISOSORBIDE MONONITRATE ER 60 MG PO TB24
60.0000 mg | ORAL_TABLET | Freq: Every day | ORAL | Status: DC
  Administered 2012-01-03 – 2012-01-04 (×2): 60 mg via ORAL
  Filled 2012-01-03 (×3): qty 1

## 2012-01-03 MED ORDER — METOPROLOL TARTRATE 12.5 MG HALF TABLET
12.5000 mg | ORAL_TABLET | Freq: Once | ORAL | Status: AC
  Administered 2012-01-05: 12.5 mg via ORAL
  Filled 2012-01-03: qty 1

## 2012-01-03 MED ORDER — CHLORHEXIDINE GLUCONATE 4 % EX LIQD
60.0000 mL | Freq: Once | CUTANEOUS | Status: AC
  Administered 2012-01-05: 4 via TOPICAL

## 2012-01-03 MED ORDER — METOPROLOL SUCCINATE ER 50 MG PO TB24
50.0000 mg | ORAL_TABLET | Freq: Every day | ORAL | Status: DC
  Administered 2012-01-03 – 2012-01-04 (×2): 50 mg via ORAL
  Filled 2012-01-03 (×3): qty 1

## 2012-01-03 NOTE — Progress Notes (Signed)
   CARDIOTHORACIC SURGERY PROGRESS NOTE  1 Day Post-Op  S/P Procedure(s) (LRB): LEFT HEART CATHETERIZATION WITH CORONARY ANGIOGRAM (N/A)  Subjective: No chest pain.  Feels well.  Objective: Vital signs in last 24 hours: Temp:  [97.5 F (36.4 C)-97.7 F (36.5 C)] 97.5 F (36.4 C) (04/20 1610) Pulse Rate:  [50-65] 56  (04/20 1041) Cardiac Rhythm:  [-] Sinus bradycardia (04/20 1000) Resp:  [18] 18  (04/20 0637) BP: (101-142)/(43-72) 115/61 mmHg (04/20 1041) SpO2:  [98 %-99 %] 99 % (04/20 9604)  Physical Exam:  Rhythm:   sinus  Breath sounds: clear  Heart sounds:  RRR  Incisions:  n/a  Abdomen:  soft  Extremities:  warm   Intake/Output from previous day: 04/19 0701 - 04/20 0700 In: -  Out: 400 [Urine:400] Intake/Output this shift: Total I/O In: -  Out: 500 [Urine:500]  Lab Results:  [facility] 01/03/12 0550 01/02/12 0740  WBC 7.7 6.9  HGB 10.8* 11.7*  HCT 34.0* 36.4*  PLT 361 407*   BMET:  Basename 01/03/12 0550 01/02/12 0740  NA 139 138  K 4.3 4.5  CL 104 101  CO2 26 25  GLUCOSE 121* 134*  BUN 11 10  CREATININE 0.71 0.63  CALCIUM 9.3 10.7*    CBG (last 3)   Basename 01/03/12 0739 01/02/12 2048 01/02/12 1652  GLUCAP 120* 94 121*   PT/INR:   Basename 01/03/12 0550  LABPROT 13.8  INR 1.04    CXR:  *RADIOLOGY REPORT*  Clinical Data: Preoperative respiratory films.  CHEST - 2 VIEW  Comparison: None.  Findings: The lungs are clear. Heart size is normal. No  pneumothorax or pleural fluid.  IMPRESSION:  No acute disease.  Original Report Authenticated By: Bernadene Bell. Maricela Curet, M.D.   Assessment/Plan: S/P Procedure(s) (LRB): LEFT HEART CATHETERIZATION WITH CORONARY ANGIOGRAM (N/A)  Clinically stable  We tentatively plan CABG on Monday if schedule permits.  , H 01/03/2012 11:29 AM

## 2012-01-03 NOTE — Progress Notes (Signed)
[facility] Progress Note  Subjective:  No chest pain   Objective:   Vital Signs in the last 24 hours: Temp:  [97.5 F (36.4 C)-97.7 F (36.5 C)] 97.5 F (36.4 C) (04/20 0637) Pulse Rate:  [49-73] 50  (04/20 0637) Resp:  [18] 18  (04/20 0637) BP: (101-142)/(43-79) 101/57 mmHg (04/20 0637) SpO2:  [98 %-99 %] 99 % (04/20 0637)  Intake/Output from previous day: 04/19 0701 - 04/20 0700 In: -  Out: 400 [Urine:400]  Scheduled:   . albuterol  2.5 mg Nebulization Once  . aspirin EC  325 mg Oral Daily  . cholecalciferol  1,000 Units Oral Daily  . clonazePAM  1 mg Oral BID  . fentaNYL      . gemfibrozil  600 mg Oral BID AC  . heparin      . insulin aspart  0-9 Units Subcutaneous TID WC  . isosorbide mononitrate  30 mg Oral Daily  . lidocaine      . metoprolol succinate  25 mg Oral Daily  . midazolam      . nitroGLYCERIN      . pantoprazole  40 mg Oral Q1200  . rosuvastatin  20 mg Oral q1800  . venlafaxine XR  150 mg Oral Daily  . DISCONTD: atorvastatin  40 mg Oral q1800  . DISCONTD: metFORMIN  1,000 mg Oral BID WC  . DISCONTD: metoprolol tartrate  25 mg Oral BID  . DISCONTD: sodium chloride  3 mL Intravenous Q12H    Physical Exam:   General appearance: alert and no distress Neck: no carotid bruit and no JVD Lungs: clear but BS decreased at bases Heart: S1, S2 normal Abdomen: soft, non-tender; bowel sounds normal; no masses,  no organomegaly Extremities: no edema, redness or tenderness in the calves or thighs   Rate: 75  Rhythm: normal sinus rhythm  Lab Results:    Basename 01/03/12 0550 01/02/12 0740  NA 139 138  K 4.3 4.5  CL 104 101  CO2 26 25  GLUCOSE 121* 134*  BUN 11 10  CREATININE 0.71 0.63   No results found for this basename: TROPONINI:2,CK,MB:2 in the last 72 hours Hepatic Function Panel  Basename 01/03/12 0550  PROT 6.8  ALBUMIN 3.9  AST 22  ALT 19  ALKPHOS 110  BILITOT 0.2*  BILIDIR --  IBILI --     Basename 01/03/12 0550  INR 1.04    Lipid Panel     Component Value Date/Time   CHOL 149 01/03/2012 0550   TRIG 106 01/03/2012 0550   HDL 35* 01/03/2012 0550   CHOLHDL 4.3 01/03/2012 0550   VLDL 21 01/03/2012 0550   LDLCALC 93 01/03/2012 0550     Imaging:  Dg Chest 2 View  01/03/2012  *RADIOLOGY REPORT*  Clinical Data: Preoperative respiratory films.  CHEST - 2 VIEW  Comparison: None.  Findings: The lungs are clear.  Heart size is normal.  No pneumothorax or pleural fluid.  IMPRESSION: No acute disease.  Original Report Authenticated By: Bernadene Bell. Maricela Curet, M.D.      Assessment/Plan:   Principal Problem:  *Unstable angina Active Problems:  Cardiovascular stress test abnormal  CAD (coronary artery disease), severe multivessel CAD surgical consult for CABG  No chest pain. Appreciate Dr. Orvan July evaluation. Heparin was started secondary to severe CAD, in patient who does not experience sig chest pain in anticipation for early CABG. Hopefully surgical date can be moved forward. Will keep in hospital today at minimum.  Otherwise,  question lovenox as outpt awaiting surgery. Will increase anti-ischemic meds.    [name], MD, [facility] 01/03/2012, 8:44 AM

## 2012-01-03 NOTE — Progress Notes (Addendum)
ANTICOAGULATION CONSULT NOTE - Follow Up Consult  Pharmacy Consult for Heparin Indication: Severe 3V CAD awaiting CABG  Allergies  Allergen Reactions  . Tramadol Rash    Patient Measurements: Height: 5\' 11"  (180.3 cm) Weight: 176 lb (79.833 kg) IBW/kg (Calculated) : 75.3  Heparin Dosing Weight: 79.8 kg  Labs:  Basename 01/03/12 0550 01/02/12 2248 01/02/12 0740  HGB 10.8* -- 11.7*  HCT 34.0* -- 36.4*  PLT 361 -- 407*  APTT -- -- --  LABPROT 13.8 -- 13.2  INR 1.04 -- 0.98  HEPARINUNFRC 0.32 0.11* --  CREATININE 0.71 -- 0.63  CKTOTAL -- -- --  CKMB -- -- --  TROPONINI -- -- --   Estimated Creatinine Clearance: 115 ml/min (by C-G formula based on Cr of 0.71).   Medications:  Infusions:    . sodium chloride 150 mL/hr at 01/02/12 1045  . heparin 1,300 Units/hr (01/03/12 0016)  . DISCONTD: sodium chloride 75 mL/hr at 01/02/12 4098    Assessment: 51 y.o. M on heparin for severe 3V CAD awaiting CABG currently scheduled for Wednesday, April 24th (may move the surgery up if possible). Heparin level this morning is therapeutic (HL 0.32, goal of 0.3-0.7). Hgb/Hct/Plt slight drop. No s/sx of bleeding noted. Heparin currently infusing at the appropriate rate.    It is noted that if the surgery cannot be moved up, then the patient may be transitioned to lovenox for use as anticoagulation as an outpatient until Wednesday. We will continue to follow up these plans for transitioning.   Goal of Therapy:  Heparin level 0.3-0.7 units/ml   Plan:  1. Continue heparin at current rate of 1300 units/hr (13 ml/hr) 2. Will continue to monitor for any signs/symptoms of bleeding and will follow up with heparin level in 6 hours from this morning's level to confirm therapeutic.  Georgina Pillion, PharmD, BCPS Clinical Pharmacist Pager: [phone] 01/03/2012 9:45 AM     Addendum:  Repeat heparin level this afternoon remains therapeutic however on lower end of range (HL 0.3 << 0.32, goal of  0.3-0.7). No s/sx of bleeding noted.  Plan: 1. Increase heparin drip rate to 1400 units/hr (14 ml/hr) 2. Will continue to monitor for any signs/symptoms of bleeding and will follow up with heparin level in the a.m.   Georgina Pillion, PharmD, BCPS Clinical Pharmacist Pager: [phone] 01/03/2012 1:12 PM

## 2012-01-04 LAB — BASIC METABOLIC PANEL
BUN: 11 mg/dL (ref 6–23)
CO2: 24 mEq/L (ref 19–32)
Calcium: 9.6 mg/dL (ref 8.4–10.5)
Creatinine, Ser: 0.73 mg/dL (ref 0.50–1.35)

## 2012-01-04 LAB — BLOOD GAS, ARTERIAL
Acid-base deficit: 0.1 mmol/L (ref 0.0–2.0)
Bicarbonate: 24 mEq/L (ref 20.0–24.0)
TCO2: 25.2 mmol/L (ref 0–100)
pCO2 arterial: 39.2 mmHg (ref 35.0–45.0)

## 2012-01-04 LAB — HEPARIN LEVEL (UNFRACTIONATED): Heparin Unfractionated: 0.52 IU/mL (ref 0.30–0.70)

## 2012-01-04 LAB — CBC
Hemoglobin: 10.4 g/dL — ABNORMAL LOW (ref 13.0–17.0)
MCH: 23.6 pg — ABNORMAL LOW (ref 26.0–34.0)
MCV: 74.5 fL — ABNORMAL LOW (ref 78.0–100.0)
RBC: 4.4 MIL/uL (ref 4.22–5.81)
WBC: 9 10*3/uL (ref 4.0–10.5)

## 2012-01-04 LAB — GLUCOSE, CAPILLARY
Glucose-Capillary: 120 mg/dL — ABNORMAL HIGH (ref 70–99)
Glucose-Capillary: 94 mg/dL (ref 70–99)
Glucose-Capillary: 83 mg/dL (ref 70–99)
Glucose-Capillary: 117 mg/dL — ABNORMAL HIGH (ref 70–99)

## 2012-01-04 MED ORDER — NITROGLYCERIN IN D5W 200-5 MCG/ML-% IV SOLN
2.0000 ug/min | INTRAVENOUS | Status: AC
  Administered 2012-01-05: 16 ug/min via INTRAVENOUS
  Filled 2012-01-04: qty 250

## 2012-01-04 MED ORDER — MAGNESIUM SULFATE 50 % IJ SOLN
40.0000 meq | INTRAMUSCULAR | Status: DC
  Filled 2012-01-04: qty 10

## 2012-01-04 MED ORDER — ALPRAZOLAM 0.25 MG PO TABS: 0.2500 mg | ORAL_TABLET | Freq: Three times a day (TID) | ORAL | Status: DC | PRN

## 2012-01-04 MED ORDER — TRANEXAMIC ACID (OHS) PUMP PRIME SOLUTION
2.0000 mg/kg | INTRAVENOUS | Status: DC
  Filled 2012-01-04: qty 1.6

## 2012-01-04 MED ORDER — SODIUM CHLORIDE 0.9 % IV SOLN
INTRAVENOUS | Status: AC
  Administered 2012-01-05: .7 [IU]/h via INTRAVENOUS
  Filled 2012-01-04: qty 1

## 2012-01-04 MED ORDER — POTASSIUM CHLORIDE 2 MEQ/ML IV SOLN
80.0000 meq | INTRAVENOUS | Status: DC
  Filled 2012-01-04: qty 40

## 2012-01-04 MED ORDER — VANCOMYCIN HCL 1000 MG IV SOLR
1250.0000 mg | INTRAVENOUS | Status: AC
  Administered 2012-01-05: 1250 mg via INTRAVENOUS
  Filled 2012-01-04: qty 1250

## 2012-01-04 MED ORDER — DEXMEDETOMIDINE HCL 100 MCG/ML IV SOLN
0.1000 ug/kg/h | INTRAVENOUS | Status: DC
  Filled 2012-01-04: qty 4

## 2012-01-04 MED ORDER — SODIUM BICARBONATE 8.4 % IV SOLN
INTRAVENOUS | Status: AC
  Administered 2012-01-05: 09:00:00
  Filled 2012-01-04 (×3): qty 2.5

## 2012-01-04 MED ORDER — DEXTROSE 5 % IV SOLN
1.5000 g | INTRAVENOUS | Status: AC
  Administered 2012-01-05: .75 g via INTRAVENOUS
  Administered 2012-01-05: 1.5 g via INTRAVENOUS
  Filled 2012-01-04: qty 1.5

## 2012-01-04 MED ORDER — SODIUM CHLORIDE 0.9 % IV SOLN
1.5000 mg/kg/h | INTRAVENOUS | Status: AC
  Administered 2012-01-05: 1.5 mg/kg/h via INTRAVENOUS
  Filled 2012-01-04 (×2): qty 25

## 2012-01-04 MED ORDER — TRANEXAMIC ACID (OHS) BOLUS VIA INFUSION
15.0000 mg/kg | INTRAVENOUS | Status: AC
  Administered 2012-01-05: 1197 mg via INTRAVENOUS
  Filled 2012-01-04 (×3): qty 1197

## 2012-01-04 MED ORDER — EPINEPHRINE HCL 1 MG/ML IJ SOLN
0.5000 ug/min | INTRAMUSCULAR | Status: DC
  Filled 2012-01-04: qty 4

## 2012-01-04 MED ORDER — PHENYLEPHRINE HCL 10 MG/ML IJ SOLN
30.0000 ug/min | INTRAVENOUS | Status: DC
  Filled 2012-01-04: qty 2

## 2012-01-04 MED ORDER — DEXTROSE 5 % IV SOLN
750.0000 mg | INTRAVENOUS | Status: DC
  Filled 2012-01-04: qty 750

## 2012-01-04 MED ORDER — DOPAMINE-DEXTROSE 3.2-5 MG/ML-% IV SOLN
2.0000 ug/kg/min | INTRAVENOUS | Status: DC
  Filled 2012-01-04: qty 250

## 2012-01-04 NOTE — Progress Notes (Signed)
   CARDIOTHORACIC SURGERY PROGRESS NOTE  2 Days Post-Op  S/P Procedure(s) (LRB): LEFT HEART CATHETERIZATION WITH CORONARY ANGIOGRAM (N/A)  Subjective: No chest pain.  Feels well.  Objective: Vital signs in last 24 hours: Temp:  [98 F (36.7 C)-98.7 F (37.1 C)] 98 F (36.7 C) (04/21 0500) Pulse Rate:  [52-62] 62  (04/21 1032) Cardiac Rhythm:  [-] Normal sinus rhythm (04/20 2200) Resp:  [18-20] 18  (04/21 0500) BP: (100-130)/(53-68) 130/68 mmHg (04/21 1032) SpO2:  [97 %-98 %] 98 % (04/21 0500)  Physical Exam:  Rhythm:   sinus  Breath sounds: clear  Heart sounds:  RRR  Incisions:  n/a  Abdomen:  soft  Extremities:  warm   Intake/Output from previous day: 04/20 0701 - 04/21 0700 In: -  Out: 500 [Urine:500] Intake/Output this shift:    Lab Results:  Basename 01/04/12 0545 01/03/12 0550  WBC 9.0 7.7  HGB 10.4* 10.8*  HCT 32.8* 34.0*  PLT 334 361   BMET:  Basename 01/04/12 0545 01/03/12 0550  NA 141 139  K 4.2 4.3  CL 105 104  CO2 24 26  GLUCOSE 124* 121*  BUN 11 11  CREATININE 0.73 0.71  CALCIUM 9.6 9.3    CBG (last 3)   Basename 01/04/12 0742 01/03/12 2121 01/03/12 1705  GLUCAP 120* 102* 103*   PT/INR:   Basename 01/03/12 0550  LABPROT 13.8  INR 1.04    CXR:  N/A  Assessment/Plan: S/P Procedure(s) (LRB): LEFT HEART CATHETERIZATION WITH CORONARY ANGIOGRAM (N/A)  Clinically stable For OR in am All questions answered  , H 01/04/2012 10:45 AM

## 2012-01-04 NOTE — Progress Notes (Signed)
[facility]  Subjective: No CP, SOB, Orthopnea  Objective: Vital signs in last 24 hours: Temp:  [98 F (36.7 C)-98.7 F (37.1 C)] 98 F (36.7 C) (04/21 0500) Pulse Rate:  [52-57] 52  (04/21 0500) Resp:  [18-20] 18  (04/21 0500) BP: (100-117)/(53-61) 116/60 mmHg (04/21 0500) SpO2:  [97 %-98 %] 98 % (04/21 0500) Last BM Date: 01/03/12  Intake/Output from previous day: 04/20 0701 - 04/21 0700 In: -  Out: 500 [Urine:500] Intake/Output this shift:    Medications Current Facility-Administered Medications  Medication Dose Route Frequency Provider Last Rate Last Dose  . acetaminophen (TYLENOL) tablet 650 mg  650 mg Oral Q4H PRN [name], MD      . aspirin EC tablet 325 mg  325 mg Oral Daily [name], MD   325 mg at 01/03/12 1041  . bisacodyl (DULCOLAX) EC tablet 5 mg  5 mg Oral Once Purcell Nails, MD      . chlorhexidine (HIBICLENS) 4 % liquid 4 application  60 mL Topical Once Purcell Nails, MD      . chlorhexidine (HIBICLENS) 4 % liquid 4 application  60 mL Topical Once Purcell Nails, MD      . chlorhexidine (HIBICLENS) 4 % liquid 4 application  60 mL Topical Once Purcell Nails, MD      . cholecalciferol (VITAMIN D) tablet 1,000 Units  1,000 Units Oral Daily [name], MD   1,000 Units at 01/03/12 1040  . clonazePAM (KLONOPIN) tablet 1 mg  1 mg Oral BID [name], MD   1 mg at 01/03/12 2203  . heparin ADULT infusion 100 units/mL (25000 units/250 mL)  1,400 Units/hr Intravenous Continuous Ann Held, PHARMD 14 mL/hr at 01/04/12 0610 1,400 Units/hr at 01/04/12 0610  . insulin aspart (novoLOG) injection 0-9 Units  0-9 Units Subcutaneous TID WC Nada Boozer, NP   1 Units at 01/03/12 1323  . isosorbide mononitrate (IMDUR) 24 hr tablet 60 mg  60 mg Oral Daily [name], MD   60 mg at 01/03/12 1041  . metoprolol succinate (TOPROL-XL) 24 hr tablet 50 mg  50 mg Oral Daily [name], MD   50 mg at 01/03/12 1041  .  metoprolol tartrate (LOPRESSOR) tablet 12.5 mg  12.5 mg Oral Once Purcell Nails, MD      . ondansetron [facility]) injection 4 mg  4 mg Intravenous Q6H PRN [name], MD      . pantoprazole (PROTONIX) EC tablet 40 mg  40 mg Oral Q1200 [name], MD   40 mg at 01/03/12 1321  . rosuvastatin (CRESTOR) tablet 40 mg  40 mg Oral q1800 [name], MD   40 mg at 01/03/12 1751  . temazepam (RESTORIL) capsule 15 mg  15 mg Oral Once PRN Purcell Nails, MD      . venlafaxine XR (EFFEXOR-XR) 24 hr capsule 150 mg  150 mg Oral Daily [name], MD   150 mg at 01/03/12 1040  . DISCONTD: 0.9 %  sodium chloride infusion   Intravenous Continuous [name], MD 150 mL/hr at 01/02/12 1045    . DISCONTD: gemfibrozil (LOPID) tablet 600 mg  600 mg Oral BID AC [name], MD   600 mg at 01/03/12 4098  . DISCONTD: isosorbide mononitrate (IMDUR) 24 hr tablet 30 mg  30 mg Oral Daily [name], MD   30 mg at 01/02/12  1130  . DISCONTD: metoprolol succinate (TOPROL-XL) 24 hr tablet 25 mg  25 mg Oral Daily [name], MD   25 mg at 01/02/12 1130  . DISCONTD: rosuvastatin (CRESTOR) tablet 20 mg  20 mg Oral q1800 [name], MD   20 mg at 01/02/12 1704    PE: General appearance: alert, cooperative and no distress Lungs: clear to auscultation bilaterally Heart: regular rate and rhythm, S1, S2 normal, no murmur, click, rub or gallop R groin site stable Extremities: No LEE Pulses: Radials 2+ and symmetric  Lab Results:   Basename 01/04/12 0545 01/03/12 0550 01/02/12 0740  WBC 9.0 7.7 6.9  HGB 10.4* 10.8* 11.7*  HCT 32.8* 34.0* 36.4*  PLT 334 361 407*   BMET  Basename 01/04/12 0545 01/03/12 0550 01/02/12 0740  NA 141 139 138  K 4.2 4.3 4.5  CL 105 104 101  CO2 24 26 25   GLUCOSE 124* 121* 134*  BUN 11 11 10   CREATININE 0.73 0.71 0.63  CALCIUM 9.6 9.3 10.7*   PT/INR  Basename 01/03/12 0550 01/02/12 0740  LABPROT 13.8 13.2  INR 1.04 0.98   Cholesterol  Basename 01/03/12  0550  CHOL 149   Cardiac Enzymes  Studies/Results: 01/03/12, CHEST - 2 VIEW  Comparison: None.  Findings: The lungs are clear. Heart size is normal. No  pneumothorax or pleural fluid.  IMPRESSION:  No acute disease.    Assessment/Plan  Principal Problem:  *Unstable angina Active Problems:  Cardiovascular stress test abnormal  CAD (coronary artery disease), severe multivessel CAD surgical consult for CABG Bradycardia  Plan:  Severe three vessel CAD.  Plan for CABG on Monday.  HR in the 50's yesterday.  70's now.  Continue to monitor. BP stable and controlled.   LOS: 2 days    HAGER,BRYAN W 01/04/2012 8:24 AM   Patient seen and examined. Agree with assessment and plan.  Remains pain free on heparin, for CABG tomorrow. Mild nitrate induced headache. Appreciate Dr. Barry Dienes moving pt up on schedule for tomorrow.   [name], MD, [facility] 01/04/2012 8:48 AM

## 2012-01-04 NOTE — Progress Notes (Addendum)
ANTICOAGULATION CONSULT NOTE - Follow Up Consult  Pharmacy Consult for Heparin Indication: Severe 3V CAD awaiting CABG  Allergies  Allergen Reactions  . Tramadol Rash    Patient Measurements: Height: 5\' 11"  (180.3 cm) Weight: 176 lb (79.833 kg) IBW/kg (Calculated) : 75.3  Heparin Dosing Weight: 79.8 kg  Labs:  Basename 01/04/12 0545 01/03/12 1210 01/03/12 0550 01/02/12 0740  HGB 10.4* -- 10.8* --  HCT 32.8* -- 34.0* 36.4*  PLT 334 -- 361 407*  APTT -- -- -- --  LABPROT -- -- 13.8 13.2  INR -- -- 1.04 0.98  HEPARINUNFRC 0.52 0.30 0.32 --  CREATININE 0.73 -- 0.71 0.63  CKTOTAL -- -- -- --  CKMB -- -- -- --  TROPONINI -- -- -- --   Estimated Creatinine Clearance: 115 ml/min (by C-G formula based on Cr of 0.73).   Medications:  Infusions:     . heparin 1,400 Units/hr (01/04/12 0610)  . DISCONTD: sodium chloride 150 mL/hr at 01/02/12 1045    Assessment: 51 y.o. M on heparin for severe 3V CAD awaiting CABG on Monday, April 22nd. Heparin level this morning is therapeutic (HL 0.52, goal of 0.3-0.7). Hgb/Hct/Plt slight drop. No s/sx of bleeding noted. It is noted that the heparin drip is to stop at midnight tonight in preparation for CABG tomorrow morning.   Goal of Therapy:  Heparin level 0.3-0.7 units/ml   Plan:  1. Continue heparin at current rate of 1400 units/hr (14 ml/hr) with a stop time entered for 2400.  2. Will continue to monitor for s/sx of bleeding while on drip and will d/c heparin labs.  Georgina Pillion, PharmD, BCPS Clinical Pharmacist Pager: [phone] 01/04/2012 11:48 AM

## 2012-01-05 ENCOUNTER — Inpatient Hospital Stay (HOSPITAL_COMMUNITY): Payer: BC Managed Care – PPO

## 2012-01-05 ENCOUNTER — Encounter (HOSPITAL_COMMUNITY)
Admission: RE | Disposition: A | Discharge status: Home / Self Care | Payer: Self-pay | Source: Ambulatory Visit | Attending: Thoracic Surgery (Cardiothoracic Vascular Surgery)

## 2012-01-05 ENCOUNTER — Encounter (HOSPITAL_COMMUNITY): Payer: Self-pay | Admitting: Thoracic Surgery (Cardiothoracic Vascular Surgery)

## 2012-01-05 ENCOUNTER — Inpatient Hospital Stay (HOSPITAL_COMMUNITY): Payer: BC Managed Care – PPO | Admitting: Anesthesiology

## 2012-01-05 ENCOUNTER — Encounter (HOSPITAL_COMMUNITY): Payer: Self-pay | Admitting: Anesthesiology

## 2012-01-05 DIAGNOSIS — I2510 Atherosclerotic heart disease of native coronary artery without angina pectoris: Secondary | ICD-10-CM

## 2012-01-05 DIAGNOSIS — Z951 Presence of aortocoronary bypass graft: Secondary | ICD-10-CM

## 2012-01-05 HISTORY — PX: CORONARY ARTERY BYPASS GRAFT: SHX141

## 2012-01-05 LAB — POCT I-STAT 4, (NA,K, GLUC, HGB,HCT)
Glucose, Bld: 169 mg/dL — ABNORMAL HIGH (ref 70–99)
Glucose, Bld: 131 mg/dL — ABNORMAL HIGH (ref 70–99)
HCT: 29 % — ABNORMAL LOW (ref 39.0–52.0)
HCT: 26 % — ABNORMAL LOW (ref 39.0–52.0)
HCT: 26 % — ABNORMAL LOW (ref 39.0–52.0)
HCT: 23 % — ABNORMAL LOW (ref 39.0–52.0)
Hemoglobin: 9.9 g/dL — ABNORMAL LOW (ref 13.0–17.0)
Hemoglobin: 8.8 g/dL — ABNORMAL LOW (ref 13.0–17.0)
Hemoglobin: 7.8 g/dL — ABNORMAL LOW (ref 13.0–17.0)
Potassium: 4.2 mEq/L (ref 3.5–5.1)
Potassium: 4.3 mEq/L (ref 3.5–5.1)
Potassium: 5.2 mEq/L — ABNORMAL HIGH (ref 3.5–5.1)
Potassium: 4.2 mEq/L (ref 3.5–5.1)
Potassium: 3.8 mEq/L (ref 3.5–5.1)
Sodium: 139 mEq/L (ref 135–145)
Sodium: 134 mEq/L — ABNORMAL LOW (ref 135–145)
Sodium: 140 mEq/L (ref 135–145)
Sodium: 143 mEq/L (ref 135–145)

## 2012-01-05 LAB — SURGICAL PCR SCREEN
MRSA, PCR: NEGATIVE
Staphylococcus aureus: NEGATIVE

## 2012-01-05 LAB — POCT I-STAT, CHEM 8
BUN: 7 mg/dL (ref 6–23)
Calcium, Ion: 1.18 mmol/L (ref 1.12–1.32)
HCT: 29 % — ABNORMAL LOW (ref 39.0–52.0)
Hemoglobin: 9.9 g/dL — ABNORMAL LOW (ref 13.0–17.0)
Sodium: 140 mEq/L (ref 135–145)
TCO2: 22 mmol/L (ref 0–100)

## 2012-01-05 LAB — CBC
HCT: 21.6 % — ABNORMAL LOW (ref 39.0–52.0)
HCT: 28.3 % — ABNORMAL LOW (ref 39.0–52.0)
Hemoglobin: 6.9 g/dL — CL (ref 13.0–17.0)
MCH: 23.5 pg — ABNORMAL LOW (ref 26.0–34.0)
MCHC: 31.8 g/dL (ref 30.0–36.0)
MCV: 75.8 fL — ABNORMAL LOW (ref 78.0–100.0)
MCV: 73.7 fL — ABNORMAL LOW (ref 78.0–100.0)
Platelets: 337 10*3/uL (ref 150–400)
Platelets: 210 10*3/uL (ref 150–400)
RBC: 4.55 MIL/uL (ref 4.22–5.81)
RBC: 2.93 MIL/uL — ABNORMAL LOW (ref 4.22–5.81)
RDW: 16.5 % — ABNORMAL HIGH (ref 11.5–15.5)
RDW: 17.1 % — ABNORMAL HIGH (ref 11.5–15.5)
WBC: 7 10*3/uL (ref 4.0–10.5)
WBC: 12.7 10*3/uL — ABNORMAL HIGH (ref 4.0–10.5)

## 2012-01-05 LAB — POCT I-STAT 3, ART BLOOD GAS (G3+)
Acid-base deficit: 4 mmol/L — ABNORMAL HIGH (ref 0.0–2.0)
Acid-base deficit: 2 mmol/L (ref 0.0–2.0)
Acid-base deficit: 3 mmol/L — ABNORMAL HIGH (ref 0.0–2.0)
Acid-base deficit: 3 mmol/L — ABNORMAL HIGH (ref 0.0–2.0)
Bicarbonate: 23.8 mEq/L (ref 20.0–24.0)
O2 Saturation: 100 %
O2 Saturation: 99 %
O2 Saturation: 99 %
O2 Saturation: 96 %
Patient temperature: 35.3
Patient temperature: 36.7
Patient temperature: 37
TCO2: 25 mmol/L (ref 0–100)
TCO2: 25 mmol/L (ref 0–100)
TCO2: 25 mmol/L (ref 0–100)
pCO2 arterial: 43.7 mmHg (ref 35.0–45.0)
pCO2 arterial: 39.6 mmHg (ref 35.0–45.0)
pCO2 arterial: 41.4 mmHg (ref 35.0–45.0)
pH, Arterial: 7.379 (ref 7.350–7.450)
pH, Arterial: 7.325 — ABNORMAL LOW (ref 7.350–7.450)
pO2, Arterial: 324 mmHg — ABNORMAL HIGH (ref 80.0–100.0)
pO2, Arterial: 117 mmHg — ABNORMAL HIGH (ref 80.0–100.0)

## 2012-01-05 LAB — GLUCOSE, CAPILLARY
Glucose-Capillary: 101 mg/dL — ABNORMAL HIGH (ref 70–99)
Glucose-Capillary: 83 mg/dL (ref 70–99)
Glucose-Capillary: 128 mg/dL — ABNORMAL HIGH (ref 70–99)
Glucose-Capillary: 158 mg/dL — ABNORMAL HIGH (ref 70–99)

## 2012-01-05 LAB — CREATININE, SERUM: Creatinine, Ser: 0.67 mg/dL (ref 0.50–1.35)

## 2012-01-05 LAB — PREPARE RBC (CROSSMATCH)

## 2012-01-05 LAB — HEMOGLOBIN AND HEMATOCRIT, BLOOD
HCT: 24.1 % — ABNORMAL LOW (ref 39.0–52.0)
Hemoglobin: 7.7 g/dL — ABNORMAL LOW (ref 13.0–17.0)

## 2012-01-05 LAB — PLATELET COUNT: Platelets: 248 10*3/uL (ref 150–400)

## 2012-01-05 LAB — PROTIME-INR
INR: 1.42 (ref 0.00–1.49)
Prothrombin Time: 17.6 seconds — ABNORMAL HIGH (ref 11.6–15.2)

## 2012-01-05 LAB — MAGNESIUM: Magnesium: 2.5 mg/dL (ref 1.5–2.5)

## 2012-01-05 LAB — APTT: aPTT: 55 seconds — ABNORMAL HIGH (ref 24–37)

## 2012-01-05 SURGERY — CORONARY ARTERY BYPASS GRAFTING (CABG)
Anesthesia: General | Site: Chest | Wound class: Clean

## 2012-01-05 MED ORDER — MORPHINE SULFATE 2 MG/ML IJ SOLN
1.0000 mg | INTRAMUSCULAR | Status: AC | PRN
  Administered 2012-01-05 (×3): 2 mg via INTRAVENOUS
  Administered 2012-01-05 (×2): 1 mg via INTRAVENOUS
  Filled 2012-01-05 (×4): qty 1

## 2012-01-05 MED ORDER — SIMVASTATIN 20 MG PO TABS: 20.0000 mg | ORAL_TABLET | Freq: Every day | ORAL | Status: DC

## 2012-01-05 MED ORDER — ONDANSETRON HCL 4 MG/2ML IJ SOLN
4.0000 mg | Freq: Four times a day (QID) | INTRAMUSCULAR | Status: DC | PRN
  Administered 2012-01-05 – 2012-01-08 (×5): 4 mg via INTRAVENOUS
  Filled 2012-01-05 (×6): qty 2

## 2012-01-05 MED ORDER — SODIUM CHLORIDE 0.9 % IJ SOLN
3.0000 mL | Freq: Two times a day (BID) | INTRAMUSCULAR | Status: DC
  Administered 2012-01-06 (×2): 3 mL via INTRAVENOUS

## 2012-01-05 MED ORDER — ROCURONIUM BROMIDE 100 MG/10ML IV SOLN
INTRAVENOUS | Status: DC | PRN
  Administered 2012-01-05: 55 mg via INTRAVENOUS
  Administered 2012-01-05: 45 mg via INTRAVENOUS

## 2012-01-05 MED ORDER — METOPROLOL TARTRATE 25 MG/10 ML ORAL SUSPENSION
12.5000 mg | Freq: Two times a day (BID) | ORAL | Status: DC
  Filled 2012-01-05 (×5): qty 5

## 2012-01-05 MED ORDER — HETASTARCH-ELECTROLYTES 6 % IV SOLN
INTRAVENOUS | Status: DC | PRN
  Administered 2012-01-05: 12:00:00 via INTRAVENOUS

## 2012-01-05 MED ORDER — MAGNESIUM SULFATE 40 MG/ML IJ SOLN
4.0000 g | Freq: Once | INTRAMUSCULAR | Status: AC
  Administered 2012-01-05: 4 g via INTRAVENOUS

## 2012-01-05 MED ORDER — KETOROLAC TROMETHAMINE 30 MG/ML IJ SOLN
30.0000 mg | Freq: Once | INTRAMUSCULAR | Status: AC | PRN
  Administered 2012-01-05: 30 mg via INTRAVENOUS
  Filled 2012-01-05: qty 1

## 2012-01-05 MED ORDER — ALBUMIN HUMAN 5 % IV SOLN
INTRAVENOUS | Status: DC | PRN
  Administered 2012-01-05: 12:00:00 via INTRAVENOUS

## 2012-01-05 MED ORDER — BISACODYL 10 MG RE SUPP: 10.0000 mg | Freq: Every day | RECTAL | Status: DC

## 2012-01-05 MED ORDER — DOCUSATE SODIUM 100 MG PO CAPS
200.0000 mg | ORAL_CAPSULE | Freq: Every day | ORAL | Status: DC
  Administered 2012-01-06 – 2012-01-09 (×4): 200 mg via ORAL
  Filled 2012-01-05 (×4): qty 2

## 2012-01-05 MED ORDER — SODIUM CHLORIDE 0.9 % IV SOLN
INTRAVENOUS | Status: DC
  Filled 2012-01-05: qty 1

## 2012-01-05 MED ORDER — PHENYLEPHRINE HCL 10 MG/ML IJ SOLN
10.0000 mg | INTRAVENOUS | Status: DC | PRN
  Administered 2012-01-05: 50 ug/min via INTRAVENOUS

## 2012-01-05 MED ORDER — MAGNESIUM SULFATE 40 MG/ML IJ SOLN
INTRAMUSCULAR | Status: AC
  Filled 2012-01-05: qty 100

## 2012-01-05 MED ORDER — VANCOMYCIN HCL IN DEXTROSE 1-5 GM/200ML-% IV SOLN
1000.0000 mg | Freq: Once | INTRAVENOUS | Status: AC
Start: 2012-01-05 — End: 2012-01-05
  Administered 2012-01-05: 1000 mg via INTRAVENOUS
  Filled 2012-01-05: qty 200

## 2012-01-05 MED ORDER — PROTAMINE SULFATE 10 MG/ML IV SOLN
INTRAVENOUS | Status: DC | PRN
  Administered 2012-01-05: 300 mg via INTRAVENOUS

## 2012-01-05 MED ORDER — ACETAMINOPHEN 160 MG/5ML PO SOLN: 975.0000 mg | Freq: Four times a day (QID) | ORAL | Status: DC

## 2012-01-05 MED ORDER — ALBUMIN HUMAN 5 % IV SOLN
250.0000 mL | INTRAVENOUS | Status: AC | PRN
  Administered 2012-01-06 (×2): 250 mL via INTRAVENOUS

## 2012-01-05 MED ORDER — LACTATED RINGERS IV SOLN
INTRAVENOUS | Status: DC
  Administered 2012-01-05: 20 mL/h via INTRAVENOUS

## 2012-01-05 MED ORDER — POTASSIUM CHLORIDE 10 MEQ/50ML IV SOLN
10.0000 meq | Freq: Once | INTRAVENOUS | Status: AC
  Administered 2012-01-05: 10 meq via INTRAVENOUS

## 2012-01-05 MED ORDER — MIDAZOLAM HCL 5 MG/5ML IJ SOLN
INTRAMUSCULAR | Status: DC | PRN
  Administered 2012-01-05: 1 mg via INTRAVENOUS
  Administered 2012-01-05 (×2): 2 mg via INTRAVENOUS
  Administered 2012-01-05: 8 mg via INTRAVENOUS
  Administered 2012-01-05: 3 mg via INTRAVENOUS

## 2012-01-05 MED ORDER — INSULIN ASPART 100 UNIT/ML ~~LOC~~ SOLN
0.0000 [IU] | SUBCUTANEOUS | Status: AC
  Administered 2012-01-05 – 2012-01-06 (×3): 2 [IU] via SUBCUTANEOUS

## 2012-01-05 MED ORDER — CALCIUM CHLORIDE 10 % IV SOLN
1.0000 g | Freq: Once | INTRAVENOUS | Status: AC | PRN
  Filled 2012-01-05: qty 10

## 2012-01-05 MED ORDER — ACETAMINOPHEN 500 MG PO TABS
1000.0000 mg | ORAL_TABLET | Freq: Four times a day (QID) | ORAL | Status: DC
  Administered 2012-01-06 – 2012-01-09 (×14): 1000 mg via ORAL
  Filled 2012-01-05 (×17): qty 2

## 2012-01-05 MED ORDER — LACTATED RINGERS IV SOLN
INTRAVENOUS | Status: DC | PRN
  Administered 2012-01-05: 06:00:00 via INTRAVENOUS

## 2012-01-05 MED ORDER — LIDOCAINE HCL (CARDIAC) 20 MG/ML IV SOLN
INTRAVENOUS | Status: DC | PRN
  Administered 2012-01-05: 80 mg via INTRAVENOUS

## 2012-01-05 MED ORDER — METOPROLOL TARTRATE 12.5 MG HALF TABLET
12.5000 mg | ORAL_TABLET | Freq: Two times a day (BID) | ORAL | Status: DC
  Administered 2012-01-05 – 2012-01-07 (×5): 12.5 mg via ORAL
  Filled 2012-01-05 (×7): qty 1

## 2012-01-05 MED ORDER — FENTANYL CITRATE 0.05 MG/ML IJ SOLN
INTRAMUSCULAR | Status: DC | PRN
  Administered 2012-01-05 (×2): 50 ug via INTRAVENOUS
  Administered 2012-01-05: 100 ug via INTRAVENOUS
  Administered 2012-01-05: 250 ug via INTRAVENOUS
  Administered 2012-01-05: 1000 ug via INTRAVENOUS
  Administered 2012-01-05: 50 ug via INTRAVENOUS

## 2012-01-05 MED ORDER — SODIUM CHLORIDE 0.9 % IJ SOLN
OROMUCOSAL | Status: DC | PRN
  Administered 2012-01-05 (×3): via TOPICAL

## 2012-01-05 MED ORDER — GEMFIBROZIL 600 MG PO TABS
600.0000 mg | ORAL_TABLET | Freq: Two times a day (BID) | ORAL | Status: DC
  Administered 2012-01-06 – 2012-01-07 (×2): 600 mg via ORAL
  Filled 2012-01-05 (×6): qty 1

## 2012-01-05 MED ORDER — POTASSIUM CHLORIDE 10 MEQ/50ML IV SOLN
10.0000 meq | INTRAVENOUS | Status: AC
  Administered 2012-01-05 (×3): 10 meq via INTRAVENOUS

## 2012-01-05 MED ORDER — PROPOFOL 10 MG/ML IV EMUL
INTRAVENOUS | Status: DC | PRN
  Administered 2012-01-05: 140 mg via INTRAVENOUS

## 2012-01-05 MED ORDER — 0.9 % SODIUM CHLORIDE (POUR BTL) OPTIME
TOPICAL | Status: DC | PRN
  Administered 2012-01-05: 5000 mL

## 2012-01-05 MED ORDER — SODIUM CHLORIDE 0.9 % IJ SOLN: 3.0000 mL | INTRAMUSCULAR | Status: DC | PRN

## 2012-01-05 MED ORDER — BISACODYL 5 MG PO TBEC
10.0000 mg | DELAYED_RELEASE_TABLET | Freq: Every day | ORAL | Status: DC
  Administered 2012-01-06: 10 mg via ORAL
  Filled 2012-01-05: qty 2

## 2012-01-05 MED ORDER — DEXTROSE 5 % IV SOLN
1.5000 g | Freq: Two times a day (BID) | INTRAVENOUS | Status: DC
  Administered 2012-01-05 – 2012-01-06 (×3): 1.5 g via INTRAVENOUS
  Filled 2012-01-05 (×4): qty 1.5

## 2012-01-05 MED ORDER — ATORVASTATIN CALCIUM 10 MG PO TABS
10.0000 mg | ORAL_TABLET | Freq: Every day | ORAL | Status: DC
  Administered 2012-01-05 – 2012-01-08 (×4): 10 mg via ORAL
  Filled 2012-01-05 (×5): qty 1

## 2012-01-05 MED ORDER — PHENYLEPHRINE HCL 10 MG/ML IJ SOLN
0.0000 ug/min | INTRAMUSCULAR | Status: DC
  Filled 2012-01-05 (×2): qty 2

## 2012-01-05 MED ORDER — ACETAMINOPHEN 650 MG RE SUPP: 650.0000 mg | RECTAL | Status: AC

## 2012-01-05 MED ORDER — HEPARIN SODIUM (PORCINE) 1000 UNIT/ML IJ SOLN
INTRAMUSCULAR | Status: DC | PRN
  Administered 2012-01-05: 3000 [IU] via INTRAVENOUS
  Administered 2012-01-05: 32000 [IU] via INTRAVENOUS

## 2012-01-05 MED ORDER — FAMOTIDINE IN NACL 20-0.9 MG/50ML-% IV SOLN
20.0000 mg | Freq: Two times a day (BID) | INTRAVENOUS | Status: DC
  Administered 2012-01-05: 20 mg via INTRAVENOUS

## 2012-01-05 MED ORDER — SODIUM CHLORIDE 0.9 % IV SOLN: 250.0000 mL | INTRAVENOUS | Status: DC

## 2012-01-05 MED ORDER — INSULIN REGULAR BOLUS VIA INFUSION
0.0000 [IU] | Freq: Three times a day (TID) | INTRAVENOUS | Status: DC
  Administered 2012-01-05: 3.6 [IU] via INTRAVENOUS
  Filled 2012-01-05: qty 10

## 2012-01-05 MED ORDER — SODIUM CHLORIDE 0.45 % IV SOLN
INTRAVENOUS | Status: DC
  Administered 2012-01-05: 20 mL/h via INTRAVENOUS

## 2012-01-05 MED ORDER — SODIUM CHLORIDE 0.9 % IV SOLN
200.0000 ug | INTRAVENOUS | Status: DC | PRN
  Administered 2012-01-05: 0.2 ug/kg/h via INTRAVENOUS

## 2012-01-05 MED ORDER — SODIUM CHLORIDE 0.9 % IV SOLN: INTRAVENOUS | Status: DC

## 2012-01-05 MED ORDER — SODIUM CHLORIDE 0.9 % IV SOLN
0.1000 ug/kg/h | INTRAVENOUS | Status: DC
  Filled 2012-01-05 (×3): qty 2

## 2012-01-05 MED ORDER — METOPROLOL TARTRATE 1 MG/ML IV SOLN: 2.5000 mg | INTRAVENOUS | Status: DC | PRN

## 2012-01-05 MED ORDER — OXYCODONE HCL 5 MG PO TABS
5.0000 mg | ORAL_TABLET | ORAL | Status: DC | PRN
  Administered 2012-01-05 (×2): 5 mg via ORAL
  Administered 2012-01-06 – 2012-01-08 (×4): 10 mg via ORAL
  Filled 2012-01-05 (×3): qty 2
  Filled 2012-01-05: qty 1
  Filled 2012-01-05: qty 2
  Filled 2012-01-05: qty 1

## 2012-01-05 MED ORDER — MIDAZOLAM HCL 2 MG/2ML IJ SOLN: 2.0000 mg | INTRAMUSCULAR | Status: DC | PRN

## 2012-01-05 MED ORDER — PANTOPRAZOLE SODIUM 40 MG PO TBEC
40.0000 mg | DELAYED_RELEASE_TABLET | Freq: Every day | ORAL | Status: DC
  Administered 2012-01-06 – 2012-01-09 (×4): 40 mg via ORAL
  Filled 2012-01-05 (×3): qty 1

## 2012-01-05 MED ORDER — NITROGLYCERIN IN D5W 200-5 MCG/ML-% IV SOLN: 0.0000 ug/min | INTRAVENOUS | Status: DC

## 2012-01-05 MED ORDER — ASPIRIN 81 MG PO CHEW: 324.0000 mg | CHEWABLE_TABLET | Freq: Every day | ORAL | Status: DC

## 2012-01-05 MED ORDER — ACETAMINOPHEN 160 MG/5ML PO SOLN
650.0000 mg | ORAL | Status: AC
  Administered 2012-01-05: 650 mg

## 2012-01-05 MED ORDER — MORPHINE SULFATE 4 MG/ML IJ SOLN
2.0000 mg | INTRAMUSCULAR | Status: DC | PRN
  Administered 2012-01-05 – 2012-01-07 (×6): 4 mg via INTRAVENOUS
  Filled 2012-01-05 (×6): qty 1

## 2012-01-05 MED ORDER — ASPIRIN EC 325 MG PO TBEC
325.0000 mg | DELAYED_RELEASE_TABLET | Freq: Every day | ORAL | Status: DC
  Administered 2012-01-06 – 2012-01-09 (×4): 325 mg via ORAL
  Filled 2012-01-05 (×4): qty 1

## 2012-01-05 MED ORDER — VECURONIUM BROMIDE 10 MG IV SOLR
INTRAVENOUS | Status: DC | PRN
  Administered 2012-01-05: 4 mg via INTRAVENOUS
  Administered 2012-01-05: 6 mg via INTRAVENOUS

## 2012-01-05 MED ORDER — INSULIN ASPART 100 UNIT/ML ~~LOC~~ SOLN: 0.0000 [IU] | SUBCUTANEOUS | Status: DC

## 2012-01-05 SURGICAL SUPPLY — 115 items
ADAPTER CARDIO PERF ANTE/RETRO (ADAPTER) IMPLANT
APPLIER CLIP 9.375 MED OPEN (MISCELLANEOUS)
APPLIER CLIP 9.375 SM OPEN (CLIP)
ATTRACTOMAT 16X20 MAGNETIC DRP (DRAPES) ×2 IMPLANT
BAG DECANTER FOR FLEXI CONT (MISCELLANEOUS) ×2 IMPLANT
BANDAGE ELASTIC 4 VELCRO ST LF (GAUZE/BANDAGES/DRESSINGS) ×2 IMPLANT
BANDAGE ELASTIC 6 VELCRO ST LF (GAUZE/BANDAGES/DRESSINGS) ×2 IMPLANT
BANDAGE GAUZE ELAST BULKY 4 IN (GAUZE/BANDAGES/DRESSINGS) ×2 IMPLANT
BASKET HEART (ORDER IN 25'S) (MISCELLANEOUS) ×1
BASKET HEART (ORDER IN 25S) (MISCELLANEOUS) ×1 IMPLANT
BENZOIN TINCTURE PRP APPL 2/3 (GAUZE/BANDAGES/DRESSINGS) IMPLANT
BLADE STERNUM SYSTEM 6 (BLADE) ×2 IMPLANT
BLADE SURG ROTATE 9660 (MISCELLANEOUS) IMPLANT
BUR CROSS CUT FISSURE 1.6 (BURR) ×2 IMPLANT
CANISTER SUCTION 2500CC (MISCELLANEOUS) ×2 IMPLANT
CANNULA GUNDRY RCSP 15FR (MISCELLANEOUS) IMPLANT
CATH CPB KIT OWEN (MISCELLANEOUS) ×2 IMPLANT
CATH ROBINSON RED A/P 18FR (CATHETERS) ×4 IMPLANT
CATH THORACIC 28FR (CATHETERS) IMPLANT
CATH THORACIC 28FR RT ANG (CATHETERS) IMPLANT
CATH THORACIC 36FR (CATHETERS) ×2 IMPLANT
CATH THORACIC 36FR RT ANG (CATHETERS) ×4 IMPLANT
CLIP APPLIE 9.375 MED OPEN (MISCELLANEOUS) IMPLANT
CLIP APPLIE 9.375 SM OPEN (CLIP) IMPLANT
CLIP FOGARTY SPRING 6M (CLIP) IMPLANT
CLIP RETRACTION 3.0MM CORONARY (MISCELLANEOUS) ×2 IMPLANT
CLIP TI MEDIUM 24 (CLIP) IMPLANT
CLIP TI WIDE RED SMALL 24 (CLIP) IMPLANT
CLOTH BEACON ORANGE TIMEOUT ST (SAFETY) ×2 IMPLANT
CONN Y 3/8X3/8X3/8  BEN (MISCELLANEOUS)
CONN Y 3/8X3/8X3/8 BEN (MISCELLANEOUS) IMPLANT
COVER SURGICAL LIGHT HANDLE (MISCELLANEOUS) ×4 IMPLANT
CRADLE DONUT ADULT HEAD (MISCELLANEOUS) ×2 IMPLANT
DRAIN CHANNEL 32F RND 10.7 FF (WOUND CARE) ×2 IMPLANT
DRAPE CARDIOVASCULAR INCISE (DRAPES) ×1
DRAPE SLUSH/WARMER DISC (DRAPES) ×2 IMPLANT
DRAPE SRG 135X102X78XABS (DRAPES) ×1 IMPLANT
DRSG COVADERM 4X14 (GAUZE/BANDAGES/DRESSINGS) ×2 IMPLANT
ELECT REM PT RETURN 9FT ADLT (ELECTROSURGICAL) ×4
ELECTRODE REM PT RTRN 9FT ADLT (ELECTROSURGICAL) ×2 IMPLANT
GLOVE BIO SURGEON STRL SZ 6 (GLOVE) ×8 IMPLANT
GLOVE BIO SURGEON STRL SZ 6.5 (GLOVE) IMPLANT
GLOVE BIO SURGEON STRL SZ7 (GLOVE) ×4 IMPLANT
GLOVE BIO SURGEON STRL SZ7.5 (GLOVE) IMPLANT
GLOVE BIOGEL PI IND STRL 6 (GLOVE) IMPLANT
GLOVE BIOGEL PI IND STRL 6.5 (GLOVE) IMPLANT
GLOVE BIOGEL PI IND STRL 7.0 (GLOVE) ×8 IMPLANT
GLOVE BIOGEL PI INDICATOR 6 (GLOVE)
GLOVE BIOGEL PI INDICATOR 6.5 (GLOVE)
GLOVE BIOGEL PI INDICATOR 7.0 (GLOVE) ×8
GLOVE EUDERMIC 7 POWDERFREE (GLOVE) IMPLANT
GLOVE ORTHO TXT STRL SZ7.5 (GLOVE) ×4 IMPLANT
GOWN STRL NON-REIN LRG LVL3 (GOWN DISPOSABLE) ×8 IMPLANT
HEMOSTAT POWDER SURGIFOAM 1G (HEMOSTASIS) ×6 IMPLANT
INSERT FOGARTY 61MM (MISCELLANEOUS) IMPLANT
INSERT FOGARTY XLG (MISCELLANEOUS) ×2 IMPLANT
KIT BASIN OR (CUSTOM PROCEDURE TRAY) ×2 IMPLANT
KIT ROOM TURNOVER OR (KITS) ×2 IMPLANT
KIT SUCTION CATH 14FR (SUCTIONS) ×2 IMPLANT
KIT VASOVIEW W/TROCAR VH 2000 (KITS) ×2 IMPLANT
LEAD PACING MYOCARDI (MISCELLANEOUS) ×2 IMPLANT
MARKER GRAFT CORONARY BYPASS (MISCELLANEOUS) ×6 IMPLANT
NS IRRIG 1000ML POUR BTL (IV SOLUTION) ×10 IMPLANT
PACK OPEN HEART (CUSTOM PROCEDURE TRAY) ×2 IMPLANT
PAD ARMBOARD 7.5X6 YLW CONV (MISCELLANEOUS) ×2 IMPLANT
PENCIL BUTTON HOLSTER BLD 10FT (ELECTRODE) ×2 IMPLANT
PUNCH AORTIC ROTATE 4.0MM (MISCELLANEOUS) IMPLANT
PUNCH AORTIC ROTATE 4.5MM 8IN (MISCELLANEOUS) IMPLANT
PUNCH AORTIC ROTATE 5MM 8IN (MISCELLANEOUS) IMPLANT
SET CARDIOPLEGIA MPS 5001102 (MISCELLANEOUS) ×2 IMPLANT
SOLUTION ANTI FOG 6CC (MISCELLANEOUS) ×2 IMPLANT
SPONGE GAUZE 4X4 12PLY (GAUZE/BANDAGES/DRESSINGS) ×4 IMPLANT
SPONGE LAP 18X18 X RAY DECT (DISPOSABLE) IMPLANT
SPONGE LAP 4X18 X RAY DECT (DISPOSABLE) IMPLANT
SUT BONE WAX W31G (SUTURE) ×2 IMPLANT
SUT ETHIBOND X763 2 0 SH 1 (SUTURE) ×4 IMPLANT
SUT MNCRL AB 3-0 PS2 18 (SUTURE) ×4 IMPLANT
SUT MNCRL AB 4-0 PS2 18 (SUTURE) ×2 IMPLANT
SUT PDS AB 1 CTX 36 (SUTURE) ×4 IMPLANT
SUT PROLENE 2 0 SH DA (SUTURE) IMPLANT
SUT PROLENE 3 0 SH DA (SUTURE) ×2 IMPLANT
SUT PROLENE 3 0 SH1 36 (SUTURE) IMPLANT
SUT PROLENE 4 0 RB 1 (SUTURE)
SUT PROLENE 4 0 SH DA (SUTURE) IMPLANT
SUT PROLENE 4-0 RB1 .5 CRCL 36 (SUTURE) IMPLANT
SUT PROLENE 5 0 C 1 36 (SUTURE) IMPLANT
SUT PROLENE 6 0 C 1 30 (SUTURE) ×4 IMPLANT
SUT PROLENE 7.0 RB 3 (SUTURE) ×18 IMPLANT
SUT PROLENE 8 0 BV175 6 (SUTURE) ×14 IMPLANT
SUT PROLENE BLUE 7 0 (SUTURE) ×4 IMPLANT
SUT PROLENE POLY MONO (SUTURE) IMPLANT
SUT SILK  1 MH (SUTURE) ×1
SUT SILK 1 MH (SUTURE) ×1 IMPLANT
SUT STEEL 6MS V (SUTURE) IMPLANT
SUT STEEL STERNAL CCS#1 18IN (SUTURE) ×2 IMPLANT
SUT STEEL SZ 6 DBL 3X14 BALL (SUTURE) ×4 IMPLANT
SUT VIC AB 1 CTX 36 (SUTURE)
SUT VIC AB 1 CTX36XBRD ANBCTR (SUTURE) IMPLANT
SUT VIC AB 2-0 CT1 27 (SUTURE) ×1
SUT VIC AB 2-0 CT1 TAPERPNT 27 (SUTURE) ×1 IMPLANT
SUT VIC AB 2-0 CTX 27 (SUTURE) IMPLANT
SUT VIC AB 3-0 SH 27 (SUTURE)
SUT VIC AB 3-0 SH 27X BRD (SUTURE) IMPLANT
SUT VIC AB 3-0 X1 27 (SUTURE) IMPLANT
SUT VICRYL 4-0 PS2 18IN ABS (SUTURE) IMPLANT
SUTURE E-PAK OPEN HEART (SUTURE) ×2 IMPLANT
SYSTEM SAHARA CHEST DRAIN ATS (WOUND CARE) ×2 IMPLANT
TAPE CLOTH SURG 4X10 WHT LF (GAUZE/BANDAGES/DRESSINGS) ×4 IMPLANT
TOWEL OR 17X24 6PK STRL BLUE (TOWEL DISPOSABLE) ×4 IMPLANT
TOWEL OR 17X26 10 PK STRL BLUE (TOWEL DISPOSABLE) ×4 IMPLANT
TRAY FOLEY IC TEMP SENS 14FR (CATHETERS) ×2 IMPLANT
TUBE SUCT INTRACARD DLP 20F (MISCELLANEOUS) ×2 IMPLANT
TUBING INSUFFLATION 10FT LAP (TUBING) ×2 IMPLANT
UNDERPAD 30X30 INCONTINENT (UNDERPADS AND DIAPERS) ×2 IMPLANT
WATER STERILE IRR 1000ML POUR (IV SOLUTION) ×4 IMPLANT

## 2012-01-05 NOTE — Progress Notes (Signed)
   CARE MANAGEMENT NOTE 01/05/2012  Patient:  Joseph Terry, Joseph Terry   Account Number:  [id number]  Date Initiated:  01/05/2012  Documentation initiated by:  ,  Subjective/Objective Assessment:   PT S/P CABGX 5 ON 01/05/12.  PTA, PT INDEPENDENT, LIVES ALONE.     Action/Plan:   PT'S SON AND DAUGHTER IN LAW HAVE ARRIVED FROM OUT OF TOWN AND WILL PROVIDE CARE FOR PT AT DISCHARGE.  WILL FOLLOW FOR HOME NEEDS.   Anticipated DC Date:  01/10/2012   Anticipated DC Plan:  HOME W HOME HEALTH SERVICES      DC Planning Services  CM consult      Choice offered to / List presented to:             Status of service:  In process, will continue to follow Medicare Important Message given?   (If response is "NO", the following Medicare IM given date fields will be blank) Date Medicare IM given:   Date Additional Medicare IM given:    Discharge Disposition:    Per UR Regulation:    If discussed at Long Length of Stay Meetings, dates discussed:    Comments:    Jerrell Belfast, RN, BSN Phone #[phone]

## 2012-01-05 NOTE — Progress Notes (Signed)
UR Completed. Simmons,  F [phone]  

## 2012-01-05 NOTE — Op Note (Signed)
Intraoperative transesophageal echocardiography note:   [name] is a 51 year old male with severe three vessel coronary artery disease and preserved left ventricular function who is scheduled to undergo coronary artery bypass grafting by Dr. Cornelius Moras. Intraoperative transesophageal echocardiography was indicated to assess for any valvular pathology and to serve as a monitor for intraoperative volume status.   The patient was brought to the operating room at [facility] and general anesthesia was induced without difficulty. Following  tracheal intubation and oral gastric suctioning, the transesophageal echocardiography probe was inserted into the esophagus without difficulty.   Impression:Pre-bypass findings:  1.  Aortic valve: the aortic valve was trial leaflet and opened normally. The leaflets were not calcified or sclerotic. There was no aortic insufficiency.  2.  Mitral valve:The  mitral leaflets coapted normally without prolapsing or flail segments. There was trace mitral insufficiency. There was mild to moderate mitral annular calcification involving the base of the posterior leaflet and there was calcification noted in the tip of the anterior lateral papillary muscle.  3.  Left ventricle:  There was vigorous contractility in all segments of the left ventricle that were damaged. Left untreated ejection fraction was estimated 60%. There was mild to moderate left ventricular hypertrophy which was concentric. Left ventricularr end diastolic diameter measured 4.2 cm and left ventricular wall thickness measured 1.2 cm.  4.  The right ventricle: there was normal appearing right ventricular function with normal right ventricular size. There was good contractility of the right ventricularr free wall.  5.  Tricuspid valve: the tricuspid valve appeared structurally normal and there was trace tricuspid insufficiency.  6.  Inter-atrial septum: the inter-atrial septum was intact without  evidence of patent foramen ovale or atriall septal defect by color Doppler or bubble study.  7.  Left atrium: there was no thrombus noted in the left atrium or left atrial appendage.  8.  Ascending aorta: the ascending  aorta was non-aneurysmal and there was mild thickening of the aortic wall but no obvious atheromatous disease noted.  9.  Descending aorta: there was grade one atheromatous disease in the wall of the descending aorta. The descending aorta measured 2.6 cm in diameter.   Post bypass findings:  1.  Aortic valve: the aortic valve appeared normal and was unchanged from the pre-bypass study . 2.  Mitral valve: there was trace mitral insufficiency.  3.  Left ventricle: the left ventricular ejection fraction was  Estimated at 60% and was unchanged from the pre-bypass study.   4.  Right ventricle: there was normal appearing right ventricular function.  5.  Tricuspid valve: there was trace tricuspid insufficiency which was unchanged from the pre-bypass study.   Kipp Brood, MD

## 2012-01-05 NOTE — Procedures (Signed)
Extubation Procedure Note  Patient Details:   Name: Liberato Stansbery DOB: [birth year] MRN: [id number]   Airway Documentation:   Pt extubated to 4L [place] at 1710. No stridor at extubation. NIF -22, VC 1.0L HR 79, BBS equal and dim. SAt95%, BP 141/74. Pt able to vocalize.  Evaluation  O2 sats: stable throughout and currently acceptable Complications: No apparent complications Patient did tolerate procedure well. Bilateral Breath Sounds: Diminished     Christie Beckers 01/05/2012, 5:17 PM

## 2012-01-05 NOTE — Brief Op Note (Signed)
01/02/2012 - 01/05/2012  10:46 AM  PATIENT:  Joseph Terry  51 y.o. male  PRE-OPERATIVE DIAGNOSIS:  coronary artery disease  POST-OPERATIVE DIAGNOSIS:  coronary artery disease  PROCEDURE:  Procedure(s) (LRB):  CORONARY ARTERY BYPASS GRAFTING x5 LIMA to LAD, SVG to Diagonal 2, SVG to OM1, and Sequential SVG to Distal RCA and PD  Endoscopic Saphenous Vein Harvest Right Leg  SURGEON:    Purcell Nails, MD  ASSISTANTS:  Lowella Dandy, PA-C  ANESTHESIA:   Kipp Brood, MD  CROSSCLAMP TIME:   30' CARDIOPULMONARY BYPASS TIME: 101'  FINDINGS:  Normal LF function  Good quality LIMA and SVG conduit for grafting  Diffuse coronary artery disease with small, diffusely diseased target vessels  COMPLICATIONS: none  PATIENT DISPOSITION:   TO SICU IN STABLE CONDITION  , H 01/05/2012 12:05 PM

## 2012-01-05 NOTE — Anesthesia Postprocedure Evaluation (Signed)
  Anesthesia Post-op Note  Patient: Joseph Terry  Procedure(s) Performed: Procedure(s) (LRB): CORONARY ARTERY BYPASS GRAFTING (CABG) (N/A)  Patient Location: SICU  Anesthesia Type: General  Level of Consciousness: sedated and Patient remains intubated per anesthesia plan  Airway and Oxygen Therapy: Patient remains intubated per anesthesia plan and Patient placed on Ventilator (see vital sign flow sheet for setting)  Post-op Pain: none  Post-op Assessment: Post-op Vital signs reviewed and Patient's Cardiovascular Status Stable  Post-op Vital Signs: stable  Complications: No apparent anesthesia complications

## 2012-01-05 NOTE — Progress Notes (Signed)
Patient ID: Joseph Terry, male   DOB: 09-02-1961, 51 y.o.   MRN: [id number]                   [address]            [place] [address]          [phone]     Day of Surgery Procedure(s) (LRB): CORONARY ARTERY BYPASS GRAFTING (CABG) (N/A)  Total Length of Stay:  LOS: 3 days  BP 112/69  Pulse 84  Temp(Src) 99 F (37.2 C) (Core (Comment))  Resp 23  Ht 5\' 11"  (1.803 m)  Wt 176 lb (79.833 kg)  BMI 24.55 kg/m2  SpO2 95%     . sodium chloride 20 mL/hr at 01/05/12 2000  . sodium chloride    . sodium chloride    . dexmedetomidine (PRECEDEX) IV infusion 0.1 mcg/kg/hr (01/05/12 2000)  . heparin 1,400 Units/hr (01/04/12 0610)  . lactated ringers 20 mL/hr at 01/05/12 2000  . nitroGLYCERIN 30 mcg/min (01/05/12 2000)  . phenylephrine (NEO-SYNEPHRINE) Adult infusion    . DISCONTD: insulin (NOVOLIN-R) infusion Stopped (01/05/12 1553)     Lab Results  Component Value Date   WBC 11.7* 01/05/2012   HGB 9.9* 01/05/2012   HCT 29.0* 01/05/2012   PLT 210 01/05/2012   GLUCOSE 159* 01/05/2012   CHOL 149 01/03/2012   TRIG 106 01/03/2012   HDL 35* 01/03/2012   LDLCALC 93 01/03/2012   ALT 19 01/03/2012   AST 22 01/03/2012   NA 140 01/05/2012   K 4.9 01/05/2012   CL 106 01/05/2012   CREATININE 0.80 01/05/2012   BUN 7 01/05/2012   CO2 24 01/04/2012   INR 1.42 01/05/2012   HGBA1C 6.4* 01/03/2012   Stable post op today Not bleeding, got two units of prbc's now  hgb 9.9  Delight Ovens MD  Beeper [phone] Office [phone] 01/05/2012 8:57 PM

## 2012-01-05 NOTE — Anesthesia Procedure Notes (Signed)
Procedures Procedures: Right IJ Theone Murdoch Catheter Insertion: [id number]: The patient was identified and consent obtained.  TO was performed, and full barrier precautions were used.  The skin was anesthetized with lidocaine-4cc plain with 25g needle.  Once the vein was located with the 22 ga. needle using ultrasound guidance , the wire was inserted into the vein.  The wire location was confirmed with ultrasound.  The tissue was dilated and the 8.5 [place] cordis catheter was carefully inserted. Afterwards Theone Murdoch catheter was inserted. PA catheter at 45cm.  The patient tolerated the procedure well.   CE

## 2012-01-05 NOTE — Transfer of Care (Signed)
Immediate Anesthesia Transfer of Care Note  Patient: Joseph Terry  Procedure(s) Performed: Procedure(s) (LRB): CORONARY ARTERY BYPASS GRAFTING (CABG) (N/A)  Patient Location: PACU  Anesthesia Type: General  Level of Consciousness: sedated  Airway & Oxygen Therapy: Patient remains intubated per anesthesia plan  Post-op Assessment: Post -op Vital signs reviewed and stable  Post vital signs: Reviewed and stable  Complications: No apparent anesthesia complications

## 2012-01-05 NOTE — Addendum Note (Signed)
Addendum  created 01/05/12 2241 by Kipp Brood, MD   Modules edited:Notes Section

## 2012-01-05 NOTE — Progress Notes (Signed)
  Echocardiogram Echocardiogram Transesophageal has been performed.  Joseph Terry 01/05/2012, 8:33 AM

## 2012-01-05 NOTE — Anesthesia Preprocedure Evaluation (Addendum)
Anesthesia Evaluation  Patient identified by MRN, date of birth, ID band Patient awake    Reviewed: Allergy & Precautions, H&P , NPO status , Patient's Chart, lab work & pertinent test results, reviewed documented beta blocker date and time   Airway Mallampati: II TM Distance: <3 FB Neck ROM: Full  Mouth opening: Limited Mouth Opening  Dental  (+) Teeth Intact and Dental Advisory Given   Pulmonary          Cardiovascular + angina + CAD     Neuro/Psych    GI/Hepatic   Endo/Other  Diabetes mellitus-  Renal/GU      Musculoskeletal   Abdominal   Peds  Hematology   Anesthesia Other Findings   Reproductive/Obstetrics                           Anesthesia Physical Anesthesia Plan  ASA: III  Anesthesia Plan: General   Post-op Pain Management:    Induction: Intravenous  Airway Management Planned: Oral ETT  Additional Equipment:   Intra-op Plan:   Post-operative Plan:   Informed Consent: I have reviewed the patients History and Physical, chart, labs and discussed the procedure including the risks, benefits and alternatives for the proposed anesthesia with the patient or authorized representative who has indicated his/her understanding and acceptance.     Plan Discussed with: CRNA and Surgeon  Anesthesia Plan Comments:         Anesthesia Quick Evaluation

## 2012-01-05 NOTE — Op Note (Signed)
CARDIOTHORACIC SURGERY OPERATIVE NOTE  Date of Procedure: 01/05/2012  Preoperative Diagnosis: Severe 3-vessel Coronary Artery Disease  Postoperative Diagnosis: Same  Procedure:   Coronary Artery Bypass Grafting x 5  Left Internal Mammary Artery to Distal Left Anterior Descending Coronary Artery  Saphenous Vein Graft to the Distal Right Coronary Artery with Sequential Saphenous Vein Graft to the Posterior Descending Coronary Artery  Saphenous Vein Graft to Obtuse Marginal Branch of Left Circumflex Coronary Artery  Sapheonous Vein Graft to Second Diagonal Branch Coronary Artery  Endoscopic Vein Harvest from Right Thigh and Lower Leg  Surgeon: Salvatore Decent. Cornelius Moras, MD  Assistant: Lowella Dandy, PA-C  Anesthesia: Kipp Brood, MD  Operative Findings:  Normal left ventricular systolic function  Good quality left internal mammary artery conduit  Good quality saphenous vein conduit  Diffusely diseased and relatively small target vessels for grafting     BRIEF CLINICAL NOTE AND INDICATIONS FOR SURGERY  Patient is a 51 year old divorced white male from [place] with no previous history of coronary artery disease but risk factors notable for history of type 2 diabetes mellitus, hyperlipidemia, and long-standing tobacco abuse. The patient states that over the last 2 or 3 years he's noticed some mild progression of fatigue and decreased exercise tolerance. Last summer during hot weather he noted that he would get some vague discomfort in the left side of his neck when he was out mowing the yard. He also has noted some very mild exertional shortness of breath. He's never had any significant chest pain. He discussed these findings with his primary care physician and was referred for a stress test and cardiology evaluation. He saw Dr. Alanda Amass in the office and underwent nuclear stress test that was felt to be high risk for ischemia. The patient was brought in for elective cardiac  catheterization by Dr. Tresa Endo. This revealed severe three-vessel coronary artery disease with normal left ventricular function. The patient was admitted to the hospital following catheterization and thoracic surgical consultation was requested.  The patient has been seen in consultation and counseled at length regarding the indications, risks and potential benefits of surgery.  All questions have been answered, and the patient provides full informed consent for the operation as described.     DETAILS OF THE OPERATIVE PROCEDURE  The patient is brought to the operating room on the above mentioned date and central monitoring was established by the anesthesia team including placement of Swan-Ganz catheter and radial arterial line. The patient is placed in the supine position on the operating table.  Intravenous antibiotics are administered. General endotracheal anesthesia is induced uneventfully. A Foley catheter is placed.  Baseline transesophageal echocardiogram was performed.  Findings were notable for normal LV size and function.  The patient's chest, abdomen, both groins, and both lower extremities are prepared and draped in a sterile manner. A time out procedure is performed.  A median sternotomy incision was performed and the left internal mammary artery is dissected from the chest wall and prepared for bypass grafting. The left internal mammary artery is notably good quality conduit. Simultaneously, saphenous vein is obtained from the patient's right thigh and leg using endoscopic vein harvest technique. The saphenous vein is notably good quality conduit. After removal of the saphenous vein, the small surgical incisions in the lower extremity are closed with absorbable suture. Following systemic heparinization, the left internal mammary artery was transected distally noted to have excellent flow.  The pericardium is opened. The ascending aorta is in appearance. The ascending aorta and the right  atrium are cannulated for cardioplegia bypass.  Adequate heparinization is verified.     The entire pre-bypass portion of the operation was notable for stable hemodynamics.  Cardiopulmonary bypass was begun and the surface of the heart is inspected. Distal target vessels are selected for coronary artery bypass grafting. A cardioplegia cannula is placed in the ascending aorta.  A temperature probe was placed in the interventricular septum.  The patient is allowed to cool passively to [facility] systemic temperature.  The aortic cross clamp is applied and cold blood cardioplegia is delivered initially in an antegrade fashion through the aortic root.  Iced saline slush is applied for topical hypothermia.  The initial cardioplegic arrest is rapid with early diastolic arrest.  Repeat doses of cardioplegia are administered intermittently throughout the entire cross clamp portion of the operation through the aortic root and through subsequently placed vein grafts in order to maintain completely flat electrocardiogram and septal myocardial temperature below 15C.  Myocardial protection was felt to be excellent.  The following distal coronary artery bypass grafts were performed:   The distal right coronary artery was grafted using a reversed saphenous vein graft in an side-to-side fashion.  At the site of distal anastomosis the target vessel was only fair quality due to diffuse plaque proximally and distally.  It measured approximately 1.8 mm in diameter.  The posterior descending branch of the right coronary artery was grafted using a sequential saphenous vein graft in an end-to-side fashion off of the vein placed to the distal right coronary artery.  At the site of distal anastomosis the target vessel was poor quality and measured approximately 1.0 mm in diameter.  Both the posterior descending coronary artery and the right posterolateral branch were small and very diffusely diseased.  The obtuse marginal branch  of the left circumflex coronary artery was grafted using a reversed saphenous vein graft in an end-to-side fashion.  At the site of distal anastomosis the target vessel was fair quality and measured approximately 1.5 mm in diameter.  The second diagonal l branch of the left anterior descending coronary artery was grafted using a reversed saphenous vein graft in an end-to-side fashion.  At the site of distal anastomosis the target vessel was fair quality and measured approximately 1.2 mm in diameter.  The distal left anterior coronary artery was grafted with the left internal mammary artery in an end-to-side fashion.  At the site of distal anastomosis the target vessel was fair to good quality and measured approximately 1.5 mm in diameter.   All proximal vein graft anastomoses were placed directly to the ascending aorta prior to removal of the aortic cross clamp.  The septal myocardial temperature rose rapidly after reperfusion of the left internal mammary artery graft.  The aortic cross clamp was removed after a total cross clamp time of 84 minutes.  All proximal and distal coronary anastomoses were inspected for hemostasis and appropriate graft orientation. Epicardial pacing wires are fixed to the right ventricular outflow tract and to the right atrial appendage. The patient is rewarmed to 37C temperature. The patient is weaned and disconnected from cardiopulmonary bypass.  The patient's rhythm at separation from bypass was normal sinus.  The patient was weaned from cardioplegic bypass without any inotropic support. Total cardiopulmonary bypass time for the operation was 101 minutes.  Followup transesophageal echocardiogram performed after separation from bypass revealed no changes from the preoperative exam.  The aortic and venous cannula were removed uneventfully. Protamine was administered to reverse the anticoagulation. The mediastinum and  pleural space were inspected for hemostasis and irrigated  with saline solution. The mediastinum and both pleural spaces were drained using 4 chest tubes placed through separate stab incisions inferiorly.  The soft tissues anterior to the aorta were reapproximated loosely. The sternum is closed with double strength sternal wire. The soft tissues anterior to the sternum were closed in multiple layers and the skin is closed with a running subcuticular skin closure.   The post-bypass portion of the operation was notable for stable rhythm and hemodynamics.   No blood products were administered during the operation.  The patient tolerated the procedure well and is transported to the surgical intensive care in stable condition. There are no intraoperative complications. All sponge instrument and needle counts are verified correct at completion of the operation.    Salvatore Decent. Cornelius Moras MD 01/05/2012 12:26 PM

## 2012-01-05 NOTE — OR Nursing (Signed)
2300 called - 15 minute call

## 2012-01-06 ENCOUNTER — Inpatient Hospital Stay (HOSPITAL_COMMUNITY): Payer: BC Managed Care – PPO

## 2012-01-06 ENCOUNTER — Encounter (HOSPITAL_COMMUNITY): Payer: Self-pay | Admitting: Cardiology

## 2012-01-06 DIAGNOSIS — Z87891 Personal history of nicotine dependence: Secondary | ICD-10-CM

## 2012-01-06 DIAGNOSIS — IMO0001 Reserved for inherently not codable concepts without codable children: Secondary | ICD-10-CM | POA: Diagnosis present

## 2012-01-06 DIAGNOSIS — E785 Hyperlipidemia, unspecified: Secondary | ICD-10-CM | POA: Diagnosis present

## 2012-01-06 LAB — CBC
MCH: 25.2 pg — ABNORMAL LOW (ref 26.0–34.0)
MCH: 24.9 pg — ABNORMAL LOW (ref 26.0–34.0)
MCHC: 32.6 g/dL (ref 30.0–36.0)
MCHC: 32.1 g/dL (ref 30.0–36.0)
MCV: 77.3 fL — ABNORMAL LOW (ref 78.0–100.0)
Platelets: 194 10*3/uL (ref 150–400)
Platelets: 199 10*3/uL (ref 150–400)
RBC: 3.53 MIL/uL — ABNORMAL LOW (ref 4.22–5.81)
RDW: 17.5 % — ABNORMAL HIGH (ref 11.5–15.5)

## 2012-01-06 LAB — POCT I-STAT, CHEM 8
BUN: 12 mg/dL (ref 6–23)
Calcium, Ion: 1.16 mmol/L (ref 1.12–1.32)
Chloride: 99 meq/L (ref 96–112)
Creatinine, Ser: 0.8 mg/dL (ref 0.50–1.35)
Glucose, Bld: 107 mg/dL — ABNORMAL HIGH (ref 70–99)
HCT: 26 % — ABNORMAL LOW (ref 39.0–52.0)
Hemoglobin: 8.8 g/dL — ABNORMAL LOW (ref 13.0–17.0)
Potassium: 4 meq/L (ref 3.5–5.1)
Sodium: 136 meq/L (ref 135–145)
TCO2: 25 mmol/L (ref 0–100)

## 2012-01-06 LAB — BASIC METABOLIC PANEL
CO2: 24 mEq/L (ref 19–32)
Calcium: 7.7 mg/dL — ABNORMAL LOW (ref 8.4–10.5)
Creatinine, Ser: 0.82 mg/dL (ref 0.50–1.35)
GFR calc non Af Amer: >90 mL/min (ref >90)
Glucose, Bld: 120 mg/dL — ABNORMAL HIGH (ref 70–99)

## 2012-01-06 LAB — GLUCOSE, CAPILLARY
Glucose-Capillary: 123 mg/dL — ABNORMAL HIGH (ref 70–99)
Glucose-Capillary: 102 mg/dL — ABNORMAL HIGH (ref 70–99)
Glucose-Capillary: 138 mg/dL — ABNORMAL HIGH (ref 70–99)
Glucose-Capillary: 112 mg/dL — ABNORMAL HIGH (ref 70–99)

## 2012-01-06 LAB — TYPE AND SCREEN
ABO/RH(D): O POS
Antibody Screen: NEGATIVE
Unit division: 0

## 2012-01-06 LAB — CREATININE, SERUM
Creatinine, Ser: 0.8 mg/dL (ref 0.50–1.35)
GFR calc non Af Amer: >90 mL/min (ref >90)

## 2012-01-06 LAB — MAGNESIUM
Magnesium: 2.3 mg/dL (ref 1.5–2.5)
Magnesium: 2.3 mg/dL (ref 1.5–2.5)

## 2012-01-06 MED ORDER — ALBUTEROL SULFATE (5 MG/ML) 0.5% IN NEBU
2.5000 mg | INHALATION_SOLUTION | RESPIRATORY_TRACT | Status: DC | PRN
  Administered 2012-01-06 – 2012-01-08 (×2): 2.5 mg via RESPIRATORY_TRACT
  Filled 2012-01-06: qty 0.5

## 2012-01-06 MED ORDER — FUROSEMIDE 10 MG/ML IJ SOLN
20.0000 mg | Freq: Four times a day (QID) | INTRAMUSCULAR | Status: AC
  Administered 2012-01-06 (×3): 20 mg via INTRAVENOUS
  Filled 2012-01-06 (×3): qty 2

## 2012-01-06 MED ORDER — MECLIZINE HCL 25 MG PO TABS
25.0000 mg | ORAL_TABLET | Freq: Two times a day (BID) | ORAL | Status: DC | PRN
  Filled 2012-01-06: qty 1

## 2012-01-06 MED ORDER — INSULIN ASPART 100 UNIT/ML ~~LOC~~ SOLN
0.0000 [IU] | SUBCUTANEOUS | Status: DC
  Administered 2012-01-06 – 2012-01-07 (×4): 2 [IU] via SUBCUTANEOUS

## 2012-01-06 MED ORDER — KETOROLAC TROMETHAMINE 15 MG/ML IJ SOLN
15.0000 mg | Freq: Four times a day (QID) | INTRAMUSCULAR | Status: AC
  Administered 2012-01-06 – 2012-01-07 (×5): 15 mg via INTRAVENOUS
  Filled 2012-01-06 (×5): qty 1

## 2012-01-06 MED ORDER — MIDAZOLAM HCL 2 MG/2ML IJ SOLN
2.0000 mg | Freq: Once | INTRAMUSCULAR | Status: AC
Start: 2012-01-06 — End: 2012-01-06
  Administered 2012-01-06: 2 mg via INTRAVENOUS
  Filled 2012-01-06: qty 2

## 2012-01-06 MED ORDER — VENLAFAXINE HCL ER 150 MG PO CP24
150.0000 mg | ORAL_CAPSULE | Freq: Every day | ORAL | Status: DC
  Administered 2012-01-07 – 2012-01-09 (×3): 150 mg via ORAL
  Filled 2012-01-06 (×5): qty 1

## 2012-01-06 MED ORDER — INSULIN GLARGINE 100 UNIT/ML ~~LOC~~ SOLN
20.0000 [IU] | Freq: Every day | SUBCUTANEOUS | Status: DC
  Administered 2012-01-06 – 2012-01-07 (×2): 20 [IU] via SUBCUTANEOUS

## 2012-01-06 MED ORDER — CLONAZEPAM 1 MG PO TABS
1.0000 mg | ORAL_TABLET | Freq: Two times a day (BID) | ORAL | Status: DC | PRN
  Administered 2012-01-06: 1 mg via ORAL
  Filled 2012-01-06: qty 1

## 2012-01-06 MED ORDER — ALBUTEROL SULFATE (5 MG/ML) 0.5% IN NEBU
INHALATION_SOLUTION | RESPIRATORY_TRACT | Status: AC
  Filled 2012-01-06: qty 0.5

## 2012-01-06 MED FILL — Magnesium Sulfate Inj 50%: INTRAMUSCULAR | Qty: 10 | Status: AC

## 2012-01-06 MED FILL — Dexmedetomidine HCl IV Soln 200 MCG/2ML: INTRAVENOUS | Qty: 2 | Status: AC

## 2012-01-06 MED FILL — Potassium Chloride Inj 2 mEq/ML: INTRAVENOUS | Qty: 40 | Status: AC

## 2012-01-06 NOTE — Progress Notes (Addendum)
Subjective:  Awake and alert up in chair  Objective:  Vital Signs in the last 24 hours: Temp:  [97 F (36.1 C)-99.7 F (37.6 C)] 98.2 F (36.8 C) (04/23 1114) Pulse Rate:  [71-90] 80  (04/23 1200) Resp:  [11-33] 30  (04/23 1200) BP: (87-141)/(55-79) 120/65 mmHg (04/23 1200) SpO2:  [89 %-100 %] 91 % (04/23 1200) Arterial Line BP: (92-153)/(49-73) 140/68 mmHg (04/23 1100) FiO2 (%):  [40 %-50 %] 50 % (04/23 0800)  Intake/Output from previous day:  Intake/Output Summary (Last 24 hours) at 01/06/12 1428 Last data filed at 01/06/12 1200  Gross per 24 hour  Intake 2962.07 ml  Output   2360 ml  Net 602.07 ml    Physical Exam: General appearance: alert, cooperative and no distress Lungs: decreased breath sounds Heart: regular rate and rhythm   Rate: 75  Rhythm: normal sinus rhythm  Lab Results:  Basename 01/06/12 0400 01/05/12 1915 01/05/12 1900  WBC 10.4 -- 11.7*  HGB 8.9* 9.9* --  PLT 194 -- 210    Basename 01/06/12 0400 01/05/12 1915 01/04/12 0545  NA 134* 140 --  K 4.6 4.9 --  CL 103 106 --  CO2 24 -- 24  GLUCOSE 120* 159* --  BUN 10 7 --  CREATININE 0.82 0.80 --   No results found for this basename: TROPONINI:2,CK,MB:2 in the last 72 hours Hepatic Function Panel No results found for this basename: PROT,ALBUMIN,AST,ALT,ALKPHOS,BILITOT,BILIDIR,IBILI in the last 72 hours No results found for this basename: CHOL in the last 72 hours  Basename 01/05/12 1245  INR 1.42    Imaging: Imaging results have been reviewed  Cardiac Studies:  Assessment/Plan:   Principal Problem:  *Unstable angina Active Problems:  Cardiovascular stress test abnormal  CAD at cath, Nl LVF, severe multivessel CAD surgical consult for CABG  S/P CABG x 5  Dyslipidemia  Diabetes mellitus  Smoking history, quit 2weeks before admssion  Plan- will follow with you  Corine Shelter PA-C 01/06/2012, 2:28 PM  Looks stable POD #1 s/p CABG Will follow along. On statin & fibrate for  HLD Gentle diuresis. Titrate BB as BP tolerates. C/o dizziness that sounds like positional Vertigo -- will order Mecilzine   Marykay Lex, M.D., M.S. [facility] [address] [place], [place]  [address]  [phone] Pager # [phone]  01/06/2012 7:26 PM

## 2012-01-06 NOTE — Progress Notes (Signed)
   CARDIOTHORACIC SURGERY PROGRESS NOTE   R1 Day Post-Op Procedure(s) (LRB): CORONARY ARTERY BYPASS GRAFTING (CABG) (N/A)  Subjective: Uncomfortable this morning with soreness in chest and nausea.   Objective: Vital signs: BP Readings from Last 1 Encounters:  01/06/12 98/64   Pulse Readings from Last 1 Encounters:  01/06/12 80   Resp Readings from Last 1 Encounters:  01/06/12 29   Temp Readings from Last 1 Encounters:  01/06/12 99.3 F (37.4 C)     Hemodynamics: PAP: (21-53)/(8-26) 46/19 mmHg CO:  [4.1 L/min-7.3 L/min] 4.1 L/min CI:  [2.1 L/min/m2-3.7 L/min/m2] 2.1 L/min/m2  Physical Exam:  Rhythm:   sinus  Breath sounds: Clear but not taking deep breaths  Heart sounds:  RRR  Incisions:  Dressings dry  Abdomen:  Soft, non distended, non tender  Extremities:  Warm, well perfused   Intake/Output from previous day: 04/22 0701 - 04/23 0700 In: 4608.6 [P.O.:290; I.V.:1276.1; Blood:1112.5; NG/GT:30; IV Piggyback:1900] Out: 4015 [Urine:2325; Emesis/NG output:100; Blood:600; Chest Tube:990] Intake/Output this shift:    Lab Results:  Basename 01/06/12 0400 01/05/12 1915 01/05/12 1900  WBC 10.4 -- 11.7*  HGB 8.9* 9.9* --  HCT 27.3* 29.0* --  PLT 194 -- 210   BMET:  Basename 01/06/12 0400 01/05/12 1915 01/04/12 0545  NA 134* 140 --  K 4.6 4.9 --  CL 103 106 --  CO2 24 -- 24  GLUCOSE 120* 159* --  BUN 10 7 --  CREATININE 0.82 0.80 --  CALCIUM 7.7* -- 9.6    CBG (last 3)   Basename 01/06/12 0423 01/06/12 0022 01/05/12 2111  GLUCAP 115* 131* 158*   ABG    Component Value Date/Time   PHART 7.345* 01/05/2012 1911   HCO3 22.6 01/05/2012 1911   TCO2 22 01/05/2012 1915   ACIDBASEDEF 3.0* 01/05/2012 1911   O2SAT 96.0 01/05/2012 1911   CXR: Clear with mild bibasilar atelectasis  Assessment/Plan: S/P Procedure(s) (LRB): CORONARY ARTERY BYPASS GRAFTING (CABG) (N/A)  Doing well POD1 Expected post op acute blood loss anemia, mild, stable Expected post op  volume excess, mild Post op atelectasis Type II diabetes mellitus, glycemic control excellent   Mobilize  D/C tubes and lines  Gentle diuresis  Possibly transfer step down once pain controlled and breathing better   , H 01/06/2012 7:41 AM

## 2012-01-06 NOTE — Progress Notes (Signed)
[facility] BRIEF SICU PROGRESS NOTE  1 Day Post-Op  S/P Procedure(s) (LRB): CORONARY ARTERY BYPASS GRAFTING (CABG) (N/A)   Stable day.  Feels better although still with intermittent nausea. NSR with stable BP O2 sats 91-97% on 6 L/min UOP adequate Labs okay  Plan: Continue routine postop  OWEN,CLARENCE H 01/06/2012 5:45 PM

## 2012-01-07 ENCOUNTER — Encounter (HOSPITAL_COMMUNITY)
Admission: RE | Disposition: A | Discharge status: Home / Self Care | Payer: Self-pay | Source: Ambulatory Visit | Attending: Thoracic Surgery (Cardiothoracic Vascular Surgery)

## 2012-01-07 ENCOUNTER — Inpatient Hospital Stay (HOSPITAL_COMMUNITY): Payer: BC Managed Care – PPO

## 2012-01-07 LAB — GLUCOSE, CAPILLARY

## 2012-01-07 LAB — BASIC METABOLIC PANEL
BUN: 12 mg/dL (ref 6–23)
Creatinine, Ser: 0.73 mg/dL (ref 0.50–1.35)
GFR calc Af Amer: >90 mL/min (ref >90)
GFR calc non Af Amer: >90 mL/min (ref >90)
Glucose, Bld: 158 mg/dL — ABNORMAL HIGH (ref 70–99)

## 2012-01-07 LAB — CBC
MCH: 25.4 pg — ABNORMAL LOW (ref 26.0–34.0)
MCHC: 32.5 g/dL (ref 30.0–36.0)
RDW: 18.1 % — ABNORMAL HIGH (ref 11.5–15.5)

## 2012-01-07 SURGERY — CORONARY ARTERY BYPASS GRAFTING (CABG)
Anesthesia: General | Site: Chest

## 2012-01-07 MED ORDER — SODIUM CHLORIDE 0.9 % IV SOLN: 250.0000 mL | INTRAVENOUS | Status: DC | PRN

## 2012-01-07 MED ORDER — SODIUM CHLORIDE 0.9 % IJ SOLN: 3.0000 mL | INTRAMUSCULAR | Status: DC | PRN

## 2012-01-07 MED ORDER — POTASSIUM CHLORIDE CRYS ER 20 MEQ PO TBCR
20.0000 meq | EXTENDED_RELEASE_TABLET | Freq: Every day | ORAL | Status: DC
  Administered 2012-01-07 – 2012-01-09 (×3): 20 meq via ORAL
  Filled 2012-01-07 (×3): qty 1

## 2012-01-07 MED ORDER — TRAMADOL HCL 50 MG PO TABS: 50.0000 mg | ORAL_TABLET | ORAL | Status: DC | PRN

## 2012-01-07 MED ORDER — INSULIN ASPART 100 UNIT/ML ~~LOC~~ SOLN: 0.0000 [IU] | Freq: Three times a day (TID) | SUBCUTANEOUS | Status: DC

## 2012-01-07 MED ORDER — SODIUM CHLORIDE 0.9 % IJ SOLN
3.0000 mL | Freq: Two times a day (BID) | INTRAMUSCULAR | Status: DC
Start: 2012-01-07 — End: 2012-01-09
  Administered 2012-01-07 – 2012-01-09 (×5): 3 mL via INTRAVENOUS

## 2012-01-07 MED ORDER — MOVING RIGHT ALONG BOOK
Freq: Once | Status: AC
  Administered 2012-01-07: 1
  Filled 2012-01-07: qty 1

## 2012-01-07 MED ORDER — MOXIFLOXACIN HCL 400 MG PO TABS
400.0000 mg | ORAL_TABLET | Freq: Every day | ORAL | Status: DC
  Administered 2012-01-07 – 2012-01-08 (×2): 400 mg via ORAL
  Filled 2012-01-07 (×3): qty 1

## 2012-01-07 MED ORDER — GUAIFENESIN ER 600 MG PO TB12
600.0000 mg | ORAL_TABLET | Freq: Two times a day (BID) | ORAL | Status: DC
  Administered 2012-01-07 – 2012-01-09 (×5): 600 mg via ORAL
  Filled 2012-01-07 (×6): qty 1

## 2012-01-07 MED ORDER — FUROSEMIDE 40 MG PO TABS
40.0000 mg | ORAL_TABLET | Freq: Every day | ORAL | Status: DC
  Administered 2012-01-07 – 2012-01-09 (×3): 40 mg via ORAL
  Filled 2012-01-07 (×3): qty 1

## 2012-01-07 MED FILL — Lidocaine HCl IV Inj 20 MG/ML: INTRAVENOUS | Qty: 5 | Status: AC

## 2012-01-07 MED FILL — Heparin Sodium (Porcine) Inj 1000 Unit/ML: INTRAMUSCULAR | Qty: 20 | Status: AC

## 2012-01-07 MED FILL — Sodium Bicarbonate IV Soln 8.4%: INTRAVENOUS | Qty: 50 | Status: AC

## 2012-01-07 MED FILL — Mannitol IV Soln 20%: INTRAVENOUS | Qty: 500 | Status: AC

## 2012-01-07 MED FILL — Electrolyte-R (PH 7.4) Solution: INTRAVENOUS | Qty: 3000 | Status: AC

## 2012-01-07 MED FILL — Sodium Chloride IV Soln 0.9%: INTRAVENOUS | Qty: 1000 | Status: AC

## 2012-01-07 MED FILL — Sodium Chloride Irrigation Soln 0.9%: Qty: 3000 | Status: AC

## 2012-01-07 MED FILL — Heparin Sodium (Porcine) Inj 1000 Unit/ML: INTRAMUSCULAR | Qty: 30 | Status: AC

## 2012-01-07 NOTE — Progress Notes (Signed)
   CARDIOTHORACIC SURGERY PROGRESS NOTE   R2 Days Post-Op Procedure(s) (LRB): CORONARY ARTERY BYPASS GRAFTING (CABG) (N/A)  Subjective: Feels better.  Productive cough.  Breathing improved.  Objective: Vital signs: BP Readings from Last 1 Encounters:  01/07/12 131/67   Pulse Readings from Last 1 Encounters:  01/07/12 78   Resp Readings from Last 1 Encounters:  01/07/12 22   Temp Readings from Last 1 Encounters:  01/07/12 98.2 F (36.8 C) Oral    Hemodynamics:    Physical Exam:  Rhythm:   sinus  Breath sounds: Scattered rhonchi  Heart sounds:  RRR  Incisions:  Clean and dry  Abdomen:  soft  Extremities:  warm   Intake/Output from previous day: 04/23 0701 - 04/24 0700 In: 853.2 [P.O.:300; I.V.:441.2; IV Piggyback:112] Out: 1170 [Urine:1010; Chest Tube:160] Intake/Output this shift: Total I/O In: 20 [I.V.:20] Out: 75 [Urine:75]  Lab Results:  Basename 01/07/12 0410 01/06/12 1714 01/06/12 1711  WBC 10.1 -- 10.7*  HGB 8.6* 8.8* --  HCT 26.5* 26.0* --  PLT 216 -- 199   BMET:  Basename 01/07/12 0410 01/06/12 1714 01/06/12 0400  NA 134* 136 --  K 4.2 4.0 --  CL 100 99 --  CO2 26 -- 24  GLUCOSE 158* 107* --  BUN 12 12 --  CREATININE 0.73 0.80 --  CALCIUM 7.6* -- 7.7*    CBG (last 3)   Basename 01/07/12 0728 01/07/12 0352 01/06/12 2348  GLUCAP 115* 136* 124*   ABG    Component Value Date/Time   PHART 7.345* 01/05/2012 1911   HCO3 22.6 01/05/2012 1911   TCO2 25 01/06/2012 1714   ACIDBASEDEF 3.0* 01/05/2012 1911   O2SAT 96.0 01/05/2012 1911   CXR: Improved RLL atelectasis  Assessment/Plan: S/P Procedure(s) (LRB): CORONARY ARTERY BYPASS GRAFTING (CABG) (N/A)  Doing well POD 2 Expected post op acute blood loss anemia, mild, stable Expected post op volume excess, mild Type II diabetes mellitus, excellent glycemic control  Longstanding tobacco abuse with tracheobronchitis Post op atelectasis   Mobilize  Gentle diuresis  Incentive  spirometry  Add mucinex for cough  Avelox for tracheobronchitis  Transfer step down   , H 01/07/2012 9:06 AM

## 2012-01-07 NOTE — Progress Notes (Signed)
Subjective:  Up in chair, coughing last night  Objective:  Vital Signs in the last 24 hours: Temp:  [98.2 F (36.8 C)-98.9 F (37.2 C)] 98.2 F (36.8 C) (04/24 0803) Pulse Rate:  [65-88] 78  (04/24 0800) Resp:  [22-31] 22  (04/24 0800) BP: (106-134)/(53-71) 131/67 mmHg (04/24 0800) SpO2:  [88 %-99 %] 96 % (04/24 0903) Arterial Line BP: (117-140)/(60-68) 140/68 mmHg (04/23 1100) FiO2 (%):  [0 %-50 %] 0 % (04/24 0903) Weight:  [80.8 kg (178 lb 2.1 oz)] 80.8 kg (178 lb 2.1 oz) (04/24 0600)  Intake/Output from previous day:  Intake/Output Summary (Last 24 hours) at 01/07/12 0914 Last data filed at 01/07/12 0800  Gross per 24 hour  Intake    802 ml  Output   1040 ml  Net   -238 ml    Physical Exam: General appearance: alert, cooperative and no distress Lungs: decreased breath sounds Rt base Heart: regular rate and rhythm   Rate: 77  Rhythm: normal sinus rhythm  Lab Results:  Basename 01/07/12 0410 01/06/12 1714 01/06/12 1711  WBC 10.1 -- 10.7*  HGB 8.6* 8.8* --  PLT 216 -- 199    Basename 01/07/12 0410 01/06/12 1714 01/06/12 0400  NA 134* 136 --  K 4.2 4.0 --  CL 100 99 --  CO2 26 -- 24  GLUCOSE 158* 107* --  BUN 12 12 --  CREATININE 0.73 0.80 --   No results found for this basename: TROPONINI:2,CK,MB:2 in the last 72 hours Hepatic Function Panel No results found for this basename: PROT,ALBUMIN,AST,ALT,ALKPHOS,BILITOT,BILIDIR,IBILI in the last 72 hours No results found for this basename: CHOL in the last 72 hours  Basename 01/05/12 1245  INR 1.42    Imaging: Imaging results have been reviewed  Cardiac Studies:  Assessment/Plan:   Principal Problem:  *S/P CABG x 5 Active Problems:  Unstable angina  Cardiovascular stress test abnormal  CAD at cath, Nl LVF, severe multivessel CAD surgical consult for CABG  Dyslipidemia  Diabetes mellitus  Smoking history, quit 2weeks before admssion   Plan- will follow. Will hold Lopid for now, (see pharmacy  note) consider starting Tricor as an OP but I doubt his triglycerides need to be treated in the immediate post-op period.  Corine Shelter PA-C 01/07/2012, 9:14 AM  I have seen & examined Joseph Terry today & reviewed the chart, agree with Mr. Wynelle Link findings, exam & receommendations. - d/c fibrate for now is fine.  Convert to Tricor / Triliptix as OP. -  Titrate BB as BP & HR tolerate  He is eagerly awaiting transfer out of ICU to tele --- hard to sleep.  Continue pulmonary toilet & gentle diuresis.  Marykay Lex, M.D., M.S. [facility] [address] [place], [place]  [address]  [phone] Pager # [phone]  01/07/2012 2:51 PM

## 2012-01-07 NOTE — Plan of Care (Signed)
Problem: Phase III Progression Outcomes Goal: Time patient transferred to PCTU/Telemetry POD Outcome: Completed/Met Date Met:  01/07/12 1600

## 2012-01-07 NOTE — Progress Notes (Signed)
Received in report that pt lower right calf incision had "saturated a 4x4 gauze and that had been changed at 1850".  Upon assessment the site is clean and dry. Applied steri strips, 2x2, and 4x4 to incision. Pt denies any complaints. Right leg is warm, pulses +2, no signs of hematoma, and pt denies any pain. Instructed pt signs to call for. Will monitor closely.

## 2012-01-08 ENCOUNTER — Inpatient Hospital Stay (HOSPITAL_COMMUNITY): Payer: BC Managed Care – PPO

## 2012-01-08 LAB — GLUCOSE, CAPILLARY: Glucose-Capillary: 89 mg/dL (ref 70–99)

## 2012-01-08 LAB — CBC
HCT: 25.6 % — ABNORMAL LOW (ref 39.0–52.0)
Hemoglobin: 8.3 g/dL — ABNORMAL LOW (ref 13.0–17.0)
MCHC: 32.4 g/dL (ref 30.0–36.0)
MCV: 77.3 fL — ABNORMAL LOW (ref 78.0–100.0)
WBC: 9.8 10*3/uL (ref 4.0–10.5)

## 2012-01-08 LAB — BASIC METABOLIC PANEL
BUN: 9 mg/dL (ref 6–23)
Chloride: 98 mEq/L (ref 96–112)
Creatinine, Ser: 0.65 mg/dL (ref 0.50–1.35)
Glucose, Bld: 118 mg/dL — ABNORMAL HIGH (ref 70–99)
Potassium: 3.7 mEq/L (ref 3.5–5.1)

## 2012-01-08 MED ORDER — METOPROLOL TARTRATE 25 MG PO TABS
25.0000 mg | ORAL_TABLET | Freq: Two times a day (BID) | ORAL | Status: DC
  Administered 2012-01-08 – 2012-01-09 (×3): 25 mg via ORAL
  Filled 2012-01-08 (×4): qty 1

## 2012-01-08 MED ORDER — POTASSIUM CHLORIDE CRYS ER 20 MEQ PO TBCR: 20.0000 meq | EXTENDED_RELEASE_TABLET | Freq: Every day | ORAL | Status: DC

## 2012-01-08 MED ORDER — POTASSIUM CHLORIDE CRYS ER 20 MEQ PO TBCR
40.0000 meq | EXTENDED_RELEASE_TABLET | Freq: Once | ORAL | Status: AC
  Administered 2012-01-08: 40 meq via ORAL
  Filled 2012-01-08: qty 2

## 2012-01-08 MED ORDER — METOPROLOL TARTRATE 25 MG PO TABS: 25.0000 mg | ORAL_TABLET | Freq: Two times a day (BID) | ORAL | Status: DC

## 2012-01-08 MED ORDER — ASPIRIN 325 MG PO TBEC: 325.0000 mg | DELAYED_RELEASE_TABLET | Freq: Every day | ORAL | Status: DC

## 2012-01-08 MED ORDER — MOXIFLOXACIN HCL 400 MG PO TABS: 400.0000 mg | ORAL_TABLET | Freq: Every day | ORAL | Status: DC

## 2012-01-08 MED ORDER — FUROSEMIDE 40 MG PO TABS: 40.0000 mg | ORAL_TABLET | Freq: Every day | ORAL | Status: DC

## 2012-01-08 MED ORDER — CENTRUM PO TABS: 1.0000 | ORAL_TABLET | Freq: Every day | ORAL | Status: AC

## 2012-01-08 MED ORDER — ATORVASTATIN CALCIUM 10 MG PO TABS: 10.0000 mg | ORAL_TABLET | Freq: Every day | ORAL | Status: DC

## 2012-01-08 MED ORDER — METFORMIN HCL 500 MG PO TABS
1000.0000 mg | ORAL_TABLET | Freq: Two times a day (BID) | ORAL | Status: DC
  Administered 2012-01-08 – 2012-01-09 (×3): 1000 mg via ORAL
  Filled 2012-01-08 (×6): qty 2

## 2012-01-08 MED ORDER — OXYCODONE HCL 5 MG PO TABS: 5.0000 mg | ORAL_TABLET | ORAL | Status: DC | PRN

## 2012-01-08 NOTE — Progress Notes (Signed)
UR Completed.  ,  [name] [phone] 01/08/2012  

## 2012-01-08 NOTE — Progress Notes (Signed)
CARDIAC REHAB PHASE I   PRE:  Rate/Rhythm: 71SR  BP:  Supine:   Sitting: 126/63  Standing:    SaO2: 98%2L, 95%RA  MODE:  Ambulation: 350 ft   POST:  Rate/Rhythem: 104ST  BP:  Supine:   Sitting: 142/67  Standing:    SaO2: 91-92%RA hall and room.  Left off of oxygen and notified RN 1130-1200 Pt walked 350 ft on RA with rolling walker and asst x1. Checked sats in hall when pt stopped once to rest. Sats at 91-92%RA. Pt did not want to go farther due to feeling worn out. Wanted to go to bed. Lunch in room. Encouraged pt to eat sitting before going back to bed. Left off oxygen. Notified RN. Set up for lunch with pt in recliner. Family in room. Encouraged IS.  Duanne Limerick

## 2012-01-08 NOTE — Discharge Instructions (Signed)
Endoscopic Saphenous Vein Harvesting Care After Refer to this sheet in the next few weeks. These instructions provide you with information on caring for yourself after your procedure. Your caregiver may also give you more specific instructions. Your treatment has been planned according to current medical practices, but problems sometimes occur. Call your caregiver if you have any problems or questions after your procedure. HOME CARE INSTRUCTIONS Medicine  Take whatever pain medicine your surgeon prescribes. Follow the directions carefully. Do not take over-the-counter pain medicine unless your surgeon says it is okay. Some pain medicine can cause bleeding problems for several weeks after surgery.   Follow your surgeon's instructions about driving. You will probably not be permitted to drive after heart surgery.   Take any medicines your surgeon prescribes. Any medicines you took before your heart surgery should be checked with your caregiver before you start taking them again.  Wound care  Ask your surgeon how long you should keep wearing your elastic bandage or stocking.   Check the area around your surgical cuts (incisions) whenever your bandages (dressings) are changed. Look for any redness or swelling.   You will need to return to have the stitches (sutures) or staples taken out. Ask your surgeon when to do that.   Ask your surgeon when you can shower or bathe.  Activity  Try to keep your legs raised when you are sitting.   Do any exercises your caregivers have given you. These may include deep breathing exercises, coughing, walking, or other exercises.  SEEK MEDICAL CARE IF:  You have any questions about your medicines.   You have more leg pain, especially if your pain medicine stops working.   New or growing bruises develop on your leg.   Your leg swells, feels tight, or becomes red.   You have numbness in your leg.  SEEK IMMEDIATE MEDICAL CARE IF:  Your pain gets much  worse.   Blood or fluid leaks from any of the incisions.   Your incisions become warm, swollen, or red.   You have chest pain.   You have trouble breathing.   You have a fever.   You have more pain near your leg incision.  MAKE SURE YOU: Understand these instructions. Coronary Artery Bypass Grafting Care After Refer to this sheet in the next few weeks. These instructions provide you with information on caring for yourself after your procedure. Your caregiver may also give you more specific instructions. Your treatment has been planned according to current medical practices, but problems sometimes occur. Call your caregiver if you have any problems or questions after your procedure.  Recovery from open heart surgery will be different for everyone. Some people feel well after 3 or 4 weeks, while for others it takes longer. After heart surgery, it may be normal to:  Not have an appetite, feel nauseated by the smell of food, or only want to eat a small amount.   Be constipated because of changes in your diet, activity, and medicines. Eat foods high in fiber. Add fresh fruits and vegetables to your diet. Stool softeners may be helpful.   Feel sad or unhappy. You may be frustrated or cranky. You may have good days and bad days. Do not give up. Talk to your caregiver if you do not feel better.   Feel weakness and fatigue. You many need physical therapy or cardiac rehabilitation to get your strength back.   Develop an irregular heartbeat called atrial fibrillation. Symptoms of atrial fibrillation are a  fast, irregular heartbeat or feelings of fluttery heartbeats, shortness of breath, low blood pressure, and dizziness. If these symptoms develop, see your caregiver right away.  MEDICATION  Have a list of all the medicines you will be taking when you leave the hospital. For every medicine, know the following:   Name.   Exact dose.   Time of day to be taken.   How often it should be taken.     Why you are taking it.   Ask which medicines should or should not be taken together. If you take more than one heart medicine, ask if it is okay to take them together. Some heart medicines should not be taken at the same time because they may lower your blood pressure too much.   Narcotic pain medicine can cause constipation. Eat fresh fruits and vegetables. Add fiber to your diet. Stool softener medicine may help relieve constipation.   Keep a copy of your medicines with you at all times.   Do not add or stop taking any medicine until you check with your caregiver.   Medicines can have side effects. Call your caregiver who prescribed the medicine if you:   Start throwing up, have diarrhea, or have stomach pain.   Feel dizzy or lightheaded when you stand up.   Feel your heart is skipping beats or is beating too fast or too slow.   Develop a rash.   Notice unusual bruising or bleeding.  HOME CARE INSTRUCTIONS  After heart surgery, it is important to learn how to take your pulse. Have your caregiver show you how to take your pulse.   Use your incentive spirometer. Ask your caregiver how long after surgery you need to use it.  Care of your chest incision  Tell your caregiver right away if you notice clicking in your chest (sternum).   Support your chest with a pillow or your arms when you take deep breaths and cough.   Follow your caregiver's instructions about when you can bathe or swim.   Protect your incision from sunlight during the first year to keep the scar from getting dark.   Tell your caregiver if you notice:   Increased tenderness of your incision.   Increased redness or swelling around your incision.   Drainage or pus from your incision.  Care of your leg incision(s)  Avoid crossing your legs.   Avoid sitting for long periods of time. Change positions every half hour.   Elevate your leg(s) when you are sitting.   Check your leg(s) daily for swelling.  Check the incisions for redness or drainage.   Wear your elastic stockings as told by your caregiver. Take them off at bedtime.  Diet  Diet is very important to heart health.   Eat plenty of fresh fruits and vegetables. Meats should be lean cut. Avoid canned, processed, and fried foods.   Talk to a dietician. They can teach you how to make healthy food and drink choices.  Weight  Weigh yourself every day. This is important because it helps to know if you are retaining fluid that may make your heart and lungs work harder.   Use the same scale each time.   Weigh yourself every morning at the same time. You should do this after you go to the bathroom, but before you eat breakfast.   Your weight will be more accurate if you do not wear any clothes.   Record your weight.   Tell your caregiver if you  have gained 2 pounds or more overnight.  Activity Stop any activity at once if you have chest pain, shortness of breath, irregular heartbeats, or dizziness. Get help right away if you have any of these symptoms.  Bathing.  Avoid soaking in a bath or hot tub until your incisions are healed.   Rest. You need a balance of rest and activity.   Exercise. Exercise per your caregiver's advice. You may need physical therapy or cardiac rehabilitation to help strengthen your muscles and build your endurance.   Climbing stairs. Unless your caregiver tells you not to climb stairs, go up stairs slowly and rest if you tire. Do not pull yourself up by the handrail.   Driving a car. Follow your caregiver's advice on when you may drive. You may ride as a passenger at any time. When traveling for long periods of time in a car, get out of the car and walk around for a few minutes every 2 hours.   Lifting. Avoid lifting, pushing, or pulling anything heavier than 10 pounds for 6 weeks after surgery or as told by your caregiver.   Returning to work. Check with your caregiver. People heal at different rates.  Most people will be able to go back to work 6 to 12 weeks after surgery.   Sexual activity. You may resume sexual relations as told by your caregiver.  SEEK MEDICAL CARE IF:  Any of your incisions are red, painful, or have any type of drainage coming from them.   You have an oral temperature above 102 F (38.9 C).   You have ankle or leg swelling.   You have pain in your legs.   You have weight gain of 2 or more pounds a day.   You feel dizzy or lightheaded when you stand up.  SEEK IMMEDIATE MEDICAL CARE IF:  You have angina or chest pain that goes to your jaw or arms. Call your local emergency services right away.   You have shortness of breath at rest or with activity.   You have a fast or irregular heartbeat (arrhythmia).   There is a "clicking" in your sternum when you move.   You have numbness or weakness in your arms or legs.  MAKE SURE YOU:  Understand these instructions.   Will watch your condition.   Will get help right away if you are not doing well or get worse.  Document Released: 03/21/2005 Document Revised: 08/21/2011 Document Reviewed: 11/06/2010  [facility] Patient Information 2012 [place], [place].  Will watch your condition.   Will get help right away if you are not doing well or get worse.  Document Released: 05/14/2011 Document Revised: 08/21/2011 Document Reviewed: 05/14/2011 [facility] Patient Information 2012 [place], [place].

## 2012-01-08 NOTE — Progress Notes (Signed)
Pt not feeling well/nauseated, requested to wait for awhile to try and walk.  Will defer to night RN

## 2012-01-08 NOTE — Progress Notes (Signed)
[facility]  Subjective: Chest a little sore. Breathing ok.  Objective: Vital signs in last 24 hours: Temp:  [98.1 F (36.7 C)-98.9 F (37.2 C)] 98.6 F (37 C) (04/25 0549) Pulse Rate:  [71-93] 93  (04/25 0549) Resp:  [19-30] 20  (04/25 0549) BP: (117-128)/(58-75) 126/75 mmHg (04/25 0549) SpO2:  [92 %-99 %] 95 % (04/25 0549) Weight:  [76.9 kg (169 lb 8.5 oz)] 76.9 kg (169 lb 8.5 oz) (04/25 0549) Last BM Date:  (Pre op)  Intake/Output from previous day: 04/24 0701 - 04/25 0700 In: 360 [P.O.:220; I.V.:140] Out: 150 [Urine:150] Intake/Output this shift:    Medications Current Facility-Administered Medications  Medication Dose Route Frequency Provider Last Rate Last Dose  . 0.9 %  sodium chloride infusion  250 mL Intravenous PRN Purcell Nails, MD      . acetaminophen (TYLENOL) tablet 1,000 mg  1,000 mg Oral Q6H Purcell Nails, MD   1,000 mg at 01/08/12 0548  . albuterol (PROVENTIL) (5 MG/ML) 0.5% nebulizer solution 2.5 mg  2.5 mg Nebulization Q4H PRN Purcell Nails, MD   2.5 mg at 01/08/12 0237  . aspirin EC tablet 325 mg  325 mg Oral Daily Purcell Nails, MD   325 mg at 01/07/12 1100  . atorvastatin (LIPITOR) tablet 10 mg  10 mg Oral q1800 Purcell Nails, MD   10 mg at 01/07/12 1723  . clonazePAM (KLONOPIN) tablet 1 mg  1 mg Oral BID PRN Purcell Nails, MD   1 mg at 01/06/12 1221  . docusate sodium (COLACE) capsule 200 mg  200 mg Oral Daily Purcell Nails, MD   200 mg at 01/07/12 1100  . furosemide (LASIX) tablet 40 mg  40 mg Oral Daily Purcell Nails, MD   40 mg at 01/07/12 1100  . guaiFENesin (MUCINEX) 12 hr tablet 600 mg  600 mg Oral BID Purcell Nails, MD   600 mg at 01/07/12 2126  . meclizine (ANTIVERT) tablet 25 mg  25 mg Oral BID PRN Marykay Lex, MD      . metFORMIN (GLUCOPHAGE) tablet 1,000 mg  1,000 mg Oral BID WC Erin R Barrett, PA      . metoprolol tartrate (LOPRESSOR) tablet 25 mg  25 mg Oral BID Purcell Nails, MD      .  moxifloxacin (AVELOX) tablet 400 mg  400 mg Oral q1800 Purcell Nails, MD   400 mg at 01/07/12 1723  . ondansetron (ZOFRAN) injection 4 mg  4 mg Intravenous Q6H PRN Purcell Nails, MD   4 mg at 01/07/12 2129  . oxyCODONE (Oxy IR/ROXICODONE) immediate release tablet 5-10 mg  5-10 mg Oral Q3H PRN Purcell Nails, MD   10 mg at 01/08/12 0548  . pantoprazole (PROTONIX) EC tablet 40 mg  40 mg Oral Q1200 Purcell Nails, MD   40 mg at 01/07/12 1100  . potassium chloride SA (K-DUR,KLOR-CON) CR tablet 20 mEq  20 mEq Oral Daily Purcell Nails, MD   20 mEq at 01/07/12 1100  . potassium chloride SA (K-DUR,KLOR-CON) CR tablet 40 mEq  40 mEq Oral Once Purcell Nails, MD      . sodium chloride 0.9 % injection 3 mL  3 mL Intravenous Q12H Purcell Nails, MD   3 mL at 01/07/12 2127  . sodium chloride 0.9 % injection 3 mL  3 mL Intravenous PRN Purcell Nails, MD      .  venlafaxine XR (EFFEXOR-XR) 24 hr capsule 150 mg  150 mg Oral Q breakfast Purcell Nails, MD   150 mg at 01/08/12 0751  . DISCONTD: insulin aspart (novoLOG) injection 0-24 Units  0-24 Units Subcutaneous TID AC & HS Purcell Nails, MD      . DISCONTD: insulin glargine (LANTUS) injection 20 Units  20 Units Subcutaneous Daily Purcell Nails, MD   20 Units at 01/07/12 1100  . DISCONTD: metoprolol tartrate (LOPRESSOR) tablet 12.5 mg  12.5 mg Oral BID Purcell Nails, MD   12.5 mg at 01/07/12 2125    PE: General appearance: alert, cooperative and no distress Lungs: Decreased BS bilaterally. Heart: regular rate and rhythm, S1, S2 normal, no murmur, click, rub or gallop Extremities: Trace right LEE Pulses: Radials 2+ and symmetric  Lab Results:   Basename 01/08/12 0635 01/07/12 0410 01/06/12 1714 01/06/12 1711  WBC 9.8 10.1 -- 10.7*  HGB 8.3* 8.6* 8.8* --  HCT 25.6* 26.5* 26.0* --  PLT 244 216 -- 199   BMET  Basename 01/08/12 0635 01/07/12 0410 01/06/12 1714 01/06/12 0400  NA 134* 134* 136 --  K 3.7 4.2 4.0 --  CL 98 100 99 --  CO2  26 26 -- 24  GLUCOSE 118* 158* 107* --  BUN 9 12 12  --  CREATININE 0.65 0.73 0.80 --  CALCIUM 8.5 7.6* -- 7.7*   PT/INR  Basename 01/05/12 1245  LABPROT 17.6*  INR 1.42    Studies/Results: CHEST - 2 VIEW  Comparison: 01/07/2012 and earlier.  Findings: Stable sequelae of CABG. Right IJ introducer sheath  removed. Continued low lung volumes. Small to moderate bilateral  pleural effusions. No pneumothorax. No pulmonary edema. Patchy  perihilar opacity on the right. Epicardial pacer wires remain in  place. Stable visualized osseous structures.  IMPRESSION:  1. Right IJ introducer sheath removed.  2. Small moderate bilateral pleural effusions with atelectasis.  Assessment/Plan  Principal Problem:  *S/P CABG x 5 Active Problems:  Unstable angina  Cardiovascular stress test abnormal  CAD at cath, Nl LVF, severe multivessel CAD surgical consult for CABG  Dyslipidemia  Diabetes mellitus  Smoking history, quit 2weeks before admssion  Plan: POD # 3,  CABG x 5(LIMA to LAD, SVG to Distal RCA w/sequential SVG to PDA, SVG to OM and Second diag).  Small to Mode bilat pleural eff.  BP controlled and stable: 113/71 - 131/67.  Ambulating well. ASA/Lipitor/ Lasix/lopressor/potassium.      LOS: 6 days    , W 01/08/2012 9:42 AM

## 2012-01-08 NOTE — Progress Notes (Signed)
Pt. Seen and examined. Agree with the NP/PA-C note as written.  Doing well s/p multivessel CABG. Diuresing. Still with small to moderate pleural effusions. Sinus rhythm.  Chrystie Nose, MD, [facility] Attending Cardiologist [facility]

## 2012-01-08 NOTE — Progress Notes (Addendum)
3 Days Post-Op Procedure(s) (LRB): CORONARY ARTERY BYPASS GRAFTING (CABG) (N/A) Subjective:  Joseph Terry states he is doing better this morning.  He continues to cough, but states the Mucinex has helped.  No bowel movement yet  Objective: Vital signs in last 24 hours: Temp:  [98.1 F (36.7 C)-98.9 F (37.2 C)] 98.6 F (37 C) (04/25 0549) Pulse Rate:  [71-93] 93  (04/25 0549) Cardiac Rhythm:  [-] Normal sinus rhythm (04/24 1930) Resp:  [19-30] 20  (04/25 0549) BP: (113-131)/(58-75) 126/75 mmHg (04/25 0549) SpO2:  [92 %-100 %] 95 % (04/25 0549) Weight:  [169 lb 8.5 oz (76.9 kg)] 169 lb 8.5 oz (76.9 kg) (04/25 0549)  Intake/Output from previous day: 04/24 0701 - 04/25 0700 In: 360 [P.O.:220; I.V.:140] Out: 150 [Urine:150]  General appearance: alert, cooperative and no distress Heart: regular rate and rhythm Lungs: clear to auscultation bilaterally Abdomen: soft, non-tender; bowel sounds normal; no masses,  no organomegaly Extremities: edema trace Wound: clean and dry  Lab Results:  [facility] 01/08/12 0635 01/07/12 0410  WBC 9.8 10.1  HGB 8.3* 8.6*  HCT 25.6* 26.5*  PLT 244 216   BMET:  Basename 01/07/12 0410 01/06/12 1714 01/06/12 0400  NA 134* 136 --  K 4.2 4.0 --  CL 100 99 --  CO2 26 -- 24  GLUCOSE 158* 107* --  BUN 12 12 --  CREATININE 0.73 0.80 --  CALCIUM 7.6* -- 7.7*    PT/INR:  Basename 01/05/12 1245  LABPROT 17.6*  INR 1.42   ABG    Component Value Date/Time   PHART 7.345* 01/05/2012 1911   HCO3 22.6 01/05/2012 1911   TCO2 25 01/06/2012 1714   ACIDBASEDEF 3.0* 01/05/2012 1911   O2SAT 96.0 01/05/2012 1911   CBG (last 3)   Basename 01/08/12 0556 01/07/12 2056 01/07/12 1639  GLUCAP 93 78 106*    Assessment/Plan: S/P Procedure(s) (LRB): CORONARY ARTERY BYPASS GRAFTING (CABG) (N/A)  2. CV- NSR, rate in the 90s, blood pressure controlled, will continue Metoprolol, will D/C EPW 3. Resp- patient weaned to 3L of oxygen, will continue to wean as  tolerated, productive cough improved with Mucinex, aggressive IS 4. Tracheobronchitis- will continue Avelox, WBC WNL 5. Acute post operative anemia- stable, will follow 6. DM- CBGs well controlled, creatinine WNL, will d/c insulin and re-start home Metformin 7. Dispo- patient is doing well, will continue to wean off oxygen as tolerated, D/C EPW this morning, if patient continues to improve likely d/c in the next 24-48 hours   LOS: 6 days    BARRETT, ERIN 01/08/2012   I have seen and examined the patient and agree with the assessment and plan as outlined.  Increase metoprolol.  Supplement K+  , H 01/08/2012 8:39 AM

## 2012-01-08 NOTE — Progress Notes (Signed)
Walks about 550 ft using rolling walker on O2 at 2 LPM/[place],tolerated well,O2 saturation post ambulation is 93%.  RN

## 2012-01-08 NOTE — Discharge Summary (Signed)
[address]            [place] [address]          [phone]      Joseph Terry [birth year] 51 y.o. [id number]  01/02/2012   Purcell Nails, MD  Chest pain CAD coronary artery disease  HPI: At time of consultation Patient is a 51 year old divorced white male from [place] with no previous history of coronary artery disease but risk factors notable for history of type 2 diabetes mellitus, hyperlipidemia, and long-standing tobacco abuse. The patient states that over the last 2 or 3 years he's noticed some mild progression of fatigue and decreased exercise tolerance. Last summer during hot weather he noted that he would get some vague discomfort in the left side of his neck when he was out mowing the yard. He also has noted some very mild exertional shortness of breath. He's never had any significant chest pain. He discussed these findings with his primary care physician and was referred for a stress test and cardiology evaluation. He saw Dr. Alanda Amass in the office and underwent nuclear stress test that was felt to be high risk for ischemia. The patient was brought in for elective cardiac catheterization today by Dr. Tresa Endo. This reveals severe three-vessel coronary artery disease with normal left ventricular function. The patient was admitted to the hospital following catheterization and a thoracic surgical consultation was requested.   No family history on file.  Social History  History   Substance Use Topics   .  Smoking status:  Former Smoker -- 1.5 packs/day for 35 years     Types:  Cigarettes     Quit date:  11/14/2011   .  Smokeless tobacco:  Not on file   .  Alcohol Use:  Not on file    Prior to Admission medications   Medication  Sig  Start Date  End Date  Taking?  Authorizing Provider   cholecalciferol (VITAMIN D) 1000 UNITS tablet  Take 1,000 Units by mouth daily.    Yes  Historical Provider, MD   clonazePAM (KLONOPIN) 1 MG tablet  Take  1 mg by mouth 2 (two) times daily.    Yes  Historical Provider, MD   esomeprazole (NEXIUM) 40 MG capsule  Take 40 mg by mouth daily before breakfast.    Yes  Historical Provider, MD   gemfibrozil (LOPID) 600 MG tablet  Take 600 mg by mouth 2 (two) times daily before a meal.    Yes  Historical Provider, MD   metFORMIN (GLUCOPHAGE) 1000 MG tablet  Take 1,000 mg by mouth 2 (two) times daily with a meal.    Yes  Historical Provider, MD   pravastatin (PRAVACHOL) 40 MG tablet  Take 40 mg by mouth daily.    Yes  Historical Provider, MD   venlafaxine XR (EFFEXOR-XR) 150 MG 24 hr capsule  Take 150 mg by mouth daily.    Yes  Historical Provider, MD   metoprolol succinate (TOPROL-XL) 25 MG 24 hr tablet  Take 25 mg by mouth daily.     Historical Provider, MD    Review of Systems: At time of consultation General: normal appetite, decreased energy  Respiratory: no cough, no wheezing, no hemoptysis, no pain with inspiration or cough, no shortness of breath  Cardiac: no chest pain or tightness, + mild exertional SOB, no resting SOB, no PND, no orthopnea,  no LE edema, no palpitations, no syncope  GI: no difficulty swallowing, chronic symptoms of reflux controlled with Nexium, no hematochezia, no hematemesis, no melena, no constipation, no diarrhea  GU: no dysuria, no urgency, no frequency  Musculoskeletal: no arthritis, no arthralgia other than mild shoulder pain  Vascular: no pain suggestive of claudication  Neuro: no symptoms suggestive of TIA's, no seizures, no headaches, no peripheral neuropathy  Endocrine: Negative, doesn't check CBG's at home  HEENT: no loose teeth or painful teeth, no recent vision changes  Psych: no anxiety, no depression  Physical Exam: At time of consultation BP 126/56  Pulse 61  Temp(Src) 96.9 F (36.1 C) (Oral)  Resp 20  Ht 5\' 11"  (1.803 m)  Wt 79.833 kg (176 lb)  BMI 24.55 kg/m2  SpO2 96%  General: thin well-appearing  HEENT: Unremarkable  Neck: no JVD, no bruits, no  adenopathy  Chest: clear to auscultation, symmetrical breath sounds, no wheezes, no rhonchi  CV: RRR, no murmur  Abdomen: soft, non-tender, no masses  Extremities: warm, well-perfused, pulses palpable  Rectal/GU Deferred  Neuro: Grossly non-focal and symmetrical throughout  Skin: Clean and dry, no rashes, no breakdown    Hospital Course: The patient was admitted for cardiac catheterization which was done on 01/02/2012 by Dr. Tresa Endo. The following results were noted per Dr. Cornelius Moras. Cardiac catheterization performed today by Dr. Nicholaus Bloom is reviewed. There is severe three-vessel coronary artery disease with preserved left ventricular function. Specifically, there is diffuse coronary artery disease with visible plaque and calcification throughout most of the epicardial coronary arteries. There is 30-40% distal stenosis of the left main coronary artery. There 60-70% ostial stenosis of left InterStim coronary artery with 70% proximal stenosis and 60-70% stenosis of the midportion of the vessel. There is 90% proximal stenosis of a medium to large size second diagonal branch. There is 50-60% tubular long segment stenosis of proximal left circumflex coronary artery with 50-70% proximal stenosis of a medium-sized bifurcating first obtuse marginal branch. There is 80% proximal stenosis of a medium-sized ramus intermediate branch. There is right dominant coronary circulation. There is diffuse 95% proximal stenosis of the right coronary artery with long segment 8% stenosis of the midportion of this vessel. Left ventricular function appears preserved with ejection fraction estimated 55% and no significant wall motion abnormalities.    these findings cardiothoracic surgical consultation was obtained with Dr. Tressie Stalker. Dr. Cornelius Moras evaluated the patient and his studies and agreed with recommendations to proceed with surgical coronary artery revascularization. He remained clinically stable and on 01/05/2012 was taken the  operating room where he underwent the following procedure:  Date of Procedure: 01/05/2012  Preoperative Diagnosis: Severe 3-vessel Coronary Artery Disease  Postoperative Diagnosis: Same  Procedure:  Coronary Artery Bypass Grafting x 5  Left Internal Mammary Artery to Distal Left Anterior Descending Coronary Artery  Saphenous Vein Graft to the Distal Right Coronary Artery with Sequential Saphenous Vein Graft to the Posterior Descending Coronary Artery  Saphenous Vein Graft to Obtuse Marginal Branch of Left Circumflex Coronary Artery  Sapheonous Vein Graft to Second Diagonal Branch Coronary Artery  Endoscopic Vein Harvest from Right Thigh and Lower Leg Surgeon: Salvatore Decent. Cornelius Moras, MD  Assistant: Lowella Dandy, PA-C  Anesthesia: Kipp Brood, MD  Operative Findings:  Normal left ventricular systolic function  Good quality left internal mammary artery conduit  Good quality saphenous vein conduit  Diffusely diseased and relatively small target vessels for grafting Patient tolerated the procedure well and was transported to the surgical intensive care  unit in stable condition. There were no intraoperative complications. Postoperative hospital course: He did have a acute blood loss anemia requiring transfusion. Studies have stabilized. He was weaned from the ventilator without difficulty. He did have some initial postoperative nausea which resolved over time using standard treatments. All routine lines, monitors, and drainage devices have been discontinued in the standard fashion. He has had no significant cardiac dysrhythmias. He does have some acute bronchitis and has been treated with Mucinex as well as a course of Avelox. He is gradually improving with his pulmonary toilet and oxygen has been weaned. Incisions are healing well evidence of infection. He is tolerating gradually increasing activities using standard protocols. He does have a moderate volume overload but is responding well to diuretics  which will be continued for a short-term as an outpatient. Blood sugars have been well controlled. He was treated with usual protocols in then converted to his oral metformin dosing. Overall his status is felt to be tentatively stable for discharge in the next 24-48 hours pending further ongoing recovery and reevaluation.    Basename 01/08/12 0635 01/07/12 0410  NA 134* 134*  K 3.7 4.2  CL 98 100  CO2 26 26  GLUCOSE 118* 158*  BUN 9 12  CALCIUM 8.5 7.6*    Basename 01/08/12 0635 01/07/12 0410  WBC 9.8 10.1  HGB 8.3* 8.6*  HCT 25.6* 26.5*  PLT 244 216    Basename 01/05/12 1245  INR 1.42     Discharge Instructions:  The patient is discharged to home with extensive instructions on wound care and progressive ambulation.  They are instructed not to drive or perform any heavy lifting until returning to see the physician in his office.  Discharge Diagnosis:  Chest pain CAD coronary artery disease Acute blood loss anemia-expected Postoperative volume overload-expected Secondary Diagnosis: Patient Active Problem List  Diagnoses  . Unstable angina  . Cardiovascular stress test abnormal  . CAD at cath, Nl LVF, severe multivessel CAD surgical consult for CABG  . S/P CABG x 5  . Dyslipidemia  . Diabetes mellitus  . Smoking history, quit 2weeks before admssion   Past Medical History  Diagnosis Date  . Unstable angina 01/02/2012  . Cardiovascular stress test abnormal 01/02/2012  . CAD (coronary artery disease), severe multivessel CAD surgical consult for CABG 01/02/2012  . Type II diabetes mellitus   . Hyperlipidemia   . Tobacco abuse   . S/P CABG x 5 01/05/2012    LIMA to LAD, SVG to D2, SVG to OM, sequential SVG to RCA and PDA, EVH via right thigh and leg  . GERD (gastroesophageal reflux disease)        Kyler, [facility] Medication Instructions ZOX:[id number]   Printed on:01/08/12 0954  Medication Information                    metFORMIN (GLUCOPHAGE) 1000 MG  tablet Take 1,000 mg by mouth 2 (two) times daily with a meal.           clonazePAM (KLONOPIN) 1 MG tablet Take 1 mg by mouth 2 (two) times daily.           venlafaxine XR (EFFEXOR-XR) 150 MG 24 hr capsule Take 150 mg by mouth daily.           esomeprazole (NEXIUM) 40 MG capsule Take 40 mg by mouth daily before breakfast.           cholecalciferol (VITAMIN D) 1000 UNITS tablet Take 1,000 Units  by mouth daily.           aspirin EC 325 MG EC tablet Take 1 tablet (325 mg total) by mouth daily.           atorvastatin (LIPITOR) 10 MG tablet Take 1 tablet (10 mg total) by mouth daily at 6 PM.           furosemide (LASIX) 40 MG tablet Take 1 tablet (40 mg total) by mouth daily.           metoprolol tartrate (LOPRESSOR) 25 MG tablet Take 1 tablet (25 mg total) by mouth 2 (two) times daily.           moxifloxacin (AVELOX) 400 MG tablet Take 1 tablet (400 mg total) by mouth daily at 6 PM.           Multiple Vitamins-Minerals (CENTRUM) tablet Take 1 tablet by mouth daily.           oxyCODONE (OXY IR/ROXICODONE) 5 MG immediate release tablet Take 1-2 tablets (5-10 mg total) by mouth every 4 (four) hours as needed.           potassium chloride SA (K-DUR,KLOR-CON) 20 MEQ tablet Take 1 tablet (20 mEq total) by mouth daily.             Disposition: For discharge home  Patient's condition is Good  Gershon Crane, PA-C 01/08/2012  9:54 AM

## 2012-01-09 LAB — HEMOGLOBIN AND HEMATOCRIT, BLOOD: Hemoglobin: 8.9 g/dL — ABNORMAL LOW (ref 13.0–17.0)

## 2012-01-09 MED ORDER — FUROSEMIDE 40 MG PO TABS: 40.0000 mg | ORAL_TABLET | Freq: Every day | ORAL | Status: DC

## 2012-01-09 MED ORDER — GEMFIBROZIL 600 MG PO TABS: 600.0000 mg | ORAL_TABLET | Freq: Two times a day (BID) | ORAL | Status: DC

## 2012-01-09 MED ORDER — POTASSIUM CHLORIDE CRYS ER 20 MEQ PO TBCR: 20.0000 meq | EXTENDED_RELEASE_TABLET | Freq: Every day | ORAL | Status: DC

## 2012-01-09 MED ORDER — MOXIFLOXACIN HCL 400 MG PO TABS: 400.0000 mg | ORAL_TABLET | Freq: Every day | ORAL | Status: AC

## 2012-01-09 NOTE — Progress Notes (Signed)
[facility] DAILY PROGRESS NOTE  NAME:  Joseph Terry   MRN: [id number] DOB:  December 01, 1960   ADMIT DATE: 01/02/2012   Patient Description   51 y.o. male with PMH below: He presented for cathetrization on 4/19 for abnormal Myoview --> MV Dz with CT Sgx consult --> CABG 4/22 --> slow but steady progression.  Has had bouts of "dizziness / vertigo " & coughing thought to be trachebronchitis Rx'd with Abx & pulmonary toilet.  Has been ambulating without much difficulty.   Past Medical History  Diagnosis Date  . Unstable angina 01/02/2012  . Cardiovascular stress test abnormal 01/02/2012  . CAD (coronary artery disease), severe multivessel CAD surgical consult for CABG 01/02/2012  . Type II diabetes mellitus   . Hyperlipidemia   . Tobacco abuse   . S/P CABG x 5 01/05/2012    LIMA to LAD, SVG to D2, SVG to OM, sequential SVG to RCA and PDA, EVH via right thigh and leg  . GERD (gastroesophageal reflux disease)    Length of Stay:  LOS: 7 days   Subjective:   Today Joseph Terry is feeling better - still had some dizzy spells with nausea yesterday but not as bad; coughing has improved, but remains on 2LO2.  Objective:  Temp:  [98.1 F (36.7 C)-99 F (37.2 C)] 98.1 F (36.7 C) (04/26 0622) Pulse Rate:  [76-91] 86  (04/26 0622) Resp:  [18-20] 19  (04/26 0622) BP: (102-129)/(63-79) 129/79 mmHg (04/26 0622) SpO2:  [90 %-93 %] 92 % (04/26 0622) Weight:  [76.8 kg (169 lb 5 oz)] 76.8 kg (169 lb 5 oz) (04/26 0622) Weight change: -0.1 kg (-3.5 oz) Physical Exam: General appearance: alert, cooperative, appears stated age, no distress and A&Ox3. Pleasant mood & affect Neck: no adenopathy, no carotid bruit, no JVD, supple, symmetrical, trachea midline, thyroid not enlarged, symmetric, no tenderness/mass/nodules and RIJ line site intact. Lungs: upper lung field sounds are normal & clear, diminished sounds in bases; but tubular "upper-airway" sounds noted in mid to lowe lung  fields; no wheezes, rales or rhonchi & normal work of breathingl Heart: regular rate and rhythm, S1, S2 normal, no murmur, click, rub or gallop Abdomen: soft, non-tender; bowel sounds normal; no masses,  no organomegaly Extremities: extremities normal, atraumatic, no cyanosis or edema Neurologic: Grossly normal  Intake/Output from previous day: 04/25 0701 - 04/26 0700 In: 243 [P.O.:240; I.V.:3] Out: 300 [Urine:300]  Intake/Output Summary (Last 24 hours) at 01/09/12 0806 Last data filed at 01/08/12 2000  Gross per 24 hour  Intake    123 ml  Output    300 ml  Net   -177 ml    Pertinent Lab Results: Cr 0.65; glucose 118; H/H stable to improved -- 8.9/27.4  Imaging: no new images  MAR Reviewed: ASA/Statin, low dose BB; metformin for DM  Mucinex & Avelox  Assessment/Plan:  Principal Problem:  *S/P CABG x 5 Active Problems:  Unstable angina  Cardiovascular stress test abnormal  CAD at cath, Nl LVF, severe multivessel CAD surgical consult for CABG  Dyslipidemia  Diabetes mellitus  Smoking history, quit 2weeks before admssion  POD #4 s/p CABG - doing well.  Breathing has improved.  No arrhythmias. On Abx for concern over tracheobronchitis -- still with some atelectasis & remains on O2 -- lower lung sounds are tubular but no rhonci. BP stable on increased BB dose - consider low dose ACE-I (Lisinopril ~2.5 mg) addition prior to discharge if not being discharged today.  On Lipitor for dyslipidemia,but Lopid help while inpatient -- would preferably be converted to Tricor or Triliptix as OP.  He will f/u with Dr. Alanda Amass after discharge. Needs continued smoking cessation counseling prior to discharge & hopefully with a plan to quit completely. This was discussed briefly (~3-5 min).    Time Spent Directly with Patient:  15 minutes   , W, M.D., M.S. [facility] [address] [place], [place]   [address]  [phone]  01/09/2012 8:06 AM

## 2012-01-09 NOTE — Progress Notes (Signed)
CARDIAC REHAB PHASE I   PRE:  Rate/Rhythm: 92SR  BP:  Supine:   Sitting: 132/96  Standing:    SaO2: 98%1L  MODE:  Ambulation: 750 ft   POST:  Rate/Rhythem: 109ST  BP:  Supine:   Sitting: 140/65  Standing:    SaO2: 91-93%RA hall,91%RA room 1135-1152 Pt walked 750 ft on RA with handheld asst.  Gait steady. Tolerated well. Did well off oxygen. Left off oxygen. Sitting on side of bed.  Duanne Limerick

## 2012-01-09 NOTE — Discharge Summary (Signed)
I agree with the above discharge summary and plan for follow-up.  , H  

## 2012-01-09 NOTE — Progress Notes (Signed)
EPW's removed per protocol with no arrythmias or complications. Pt tolerated procedure well. Bedrest for ~1-2hrs. Pain meds given pre-procedure. Will continue to monitor.  

## 2012-01-09 NOTE — Progress Notes (Signed)
Cardiac Rehab 1020-1045 Pt on bedrest from pacing wire removal. Education done. Permission given to refer to [facility] Phase 2. Will return to walk and evaluate sats after bedrest up. DunlapRN

## 2012-01-09 NOTE — Progress Notes (Signed)
Discharge instructions and prescriptions given to pt. Pt verbalized understanding. D/ced IV, tolerated well. No bleeding. Ready for discharge

## 2012-01-09 NOTE — Progress Notes (Signed)
[address]            [place] [address]          [phone]     4 Days Post-Op  Procedure(s) (LRB): CORONARY ARTERY BYPASS GRAFTING (CABG) (N/A) Subjective: feels ok, still on 2l O2, mild nausea  Objective  Telemetry SR  Temp:  [98.1 F (36.7 C)-99 F (37.2 C)] 98.1 F (36.7 C) (04/26 0622) Pulse Rate:  [76-91] 86  (04/26 0622) Resp:  [18-20] 19  (04/26 0622) BP: (102-129)/(63-79) 129/79 mmHg (04/26 0622) SpO2:  [90 %-93 %] 92 % (04/26 0622) Weight:  [169 lb 5 oz (76.8 kg)] 169 lb 5 oz (76.8 kg) (04/26 0622)   Intake/Output Summary (Last 24 hours) at 01/09/12 0757 Last data filed at 01/08/12 2000  Gross per 24 hour  Intake    123 ml  Output    300 ml  Net   -177 ml       General appearance: alert, cooperative and no distress Heart: regular rate and rhythm and S1, S2 normal Lungs: coarse upper airways, improves with cough Abdomen: soft, nontender, + BS Extremities: no edema Wound: incisions healing well   Lab Results:  Basename 01/08/12 0635 01/07/12 0410 01/06/12 1711  NA 134* 134* --  K 3.7 4.2 --  CL 98 100 --  CO2 26 26 --  GLUCOSE 118* 158* --  BUN 9 12 --  CREATININE 0.65 0.73 --  CALCIUM 8.5 7.6* --  MG -- -- 2.3  PHOS -- -- --   No results found for this basename: AST:2,ALT:2,ALKPHOS:2,BILITOT:2,PROT:2,ALBUMIN:2 in the last 72 hours No results found for this basename: LIPASE:2,AMYLASE:2 in the last 72 hours  Basename 01/09/12 0525 01/08/12 0635 01/07/12 0410  WBC -- 9.8 10.1  NEUTROABS -- -- --  HGB 8.9* 8.3* --  HCT 27.4* 25.6* --  MCV -- 77.3* 78.4  PLT -- 244 216   No results found for this basename: CKTOTAL:4,CKMB:4,TROPONINI:4 in the last 72 hours No components found with this basename: POCBNP:3 No results found for this basename: DDIMER in the last 72 hours No results found for this basename: HGBA1C in the last 72 hours No results found for this basename: CHOL,HDL,LDLCALC,TRIG,CHOLHDL in the  last 72 hours No results found for this basename: TSH,T4TOTAL,FREET3,T3FREE,THYROIDAB in the last 72 hours No results found for this basename: VITAMINB12,FOLATE,FERRITIN,TIBC,IRON,RETICCTPCT in the last 72 hours  Medications: Scheduled    . acetaminophen  1,000 mg Oral Q6H  . aspirin EC  325 mg Oral Daily  . atorvastatin  10 mg Oral q1800  . docusate sodium  200 mg Oral Daily  . furosemide  40 mg Oral Daily  . guaiFENesin  600 mg Oral BID  . metFORMIN  1,000 mg Oral BID WC  . metoprolol tartrate  25 mg Oral BID  . moxifloxacin  400 mg Oral q1800  . pantoprazole  40 mg Oral Q1200  . potassium chloride  20 mEq Oral Daily  . potassium chloride  40 mEq Oral Once  . sodium chloride  3 mL Intravenous Q12H  . venlafaxine XR  150 mg Oral Q breakfast  . DISCONTD: insulin aspart  0-24 Units Subcutaneous TID AC & HS  . DISCONTD: insulin glargine  20 Units Subcutaneous Daily  . DISCONTD: metoprolol tartrate  12.5 mg Oral BID     Radiology/Studies:  Dg Chest 2 View  01/08/2012  *RADIOLOGY  REPORT*  Clinical Data: 51 year old male with shortness of breath, atelectasis, unstable angina, diabetes.  CHEST - 2 VIEW  Comparison: 01/07/2012 and earlier.  Findings: Stable sequelae of CABG. Right IJ introducer sheath removed.  Continued low lung volumes.  Small to moderate bilateral pleural effusions.  No pneumothorax.  No pulmonary edema.  Patchy perihilar opacity on the right.  Epicardial pacer wires remain in place. Stable visualized osseous structures.    IMPRESSION: 1.  Right IJ introducer sheath removed. 2.  Small moderate bilateral pleural effusions with atelectasis.  Original Report Authenticated By: Harley Hallmark, M.D.    INR: Will add last result for INR, ABG once components are confirmed Will add last 4 CBG results once components are confirmed  Assessment/Plan: S/P Procedure(s) (LRB): CORONARY ARTERY BYPASS GRAFTING (CABG) (N/A)  1. Doing well, will d/c epw's this am and attempt to wean  off O2. Could possibly be discharged later today  LOS: 7 days    , E 4/26/20137:57 AM

## 2012-01-13 ENCOUNTER — Encounter: Payer: Self-pay | Admitting: Thoracic Surgery (Cardiothoracic Vascular Surgery)

## 2012-01-28 ENCOUNTER — Other Ambulatory Visit: Payer: Self-pay | Admitting: Thoracic Surgery (Cardiothoracic Vascular Surgery)

## 2012-01-28 DIAGNOSIS — I2510 Atherosclerotic heart disease of native coronary artery without angina pectoris: Secondary | ICD-10-CM

## 2012-01-29 ENCOUNTER — Other Ambulatory Visit: Payer: Self-pay | Admitting: *Deleted

## 2012-01-29 DIAGNOSIS — G8918 Other acute postprocedural pain: Secondary | ICD-10-CM

## 2012-01-29 MED ORDER — HYDROCODONE-ACETAMINOPHEN 7.5-500 MG PO TABS: 1.0000 | ORAL_TABLET | Freq: Four times a day (QID) | ORAL | Status: AC | PRN

## 2012-02-02 ENCOUNTER — Encounter: Payer: Self-pay | Admitting: Thoracic Surgery (Cardiothoracic Vascular Surgery)

## 2012-02-02 ENCOUNTER — Ambulatory Visit (INDEPENDENT_AMBULATORY_CARE_PROVIDER_SITE_OTHER): Payer: Self-pay | Admitting: Thoracic Surgery (Cardiothoracic Vascular Surgery)

## 2012-02-02 ENCOUNTER — Ambulatory Visit
Admission: RE | Admit: 2012-02-02 | Discharge: 2012-02-02 | Disposition: A | Discharge status: Home / Self Care | Payer: BC Managed Care – PPO | Source: Ambulatory Visit | Attending: Thoracic Surgery (Cardiothoracic Vascular Surgery) | Admitting: Thoracic Surgery (Cardiothoracic Vascular Surgery)

## 2012-02-02 VITALS — BP 132/80 | HR 78 | Resp 16 | Ht 71.0 in | Wt 163.5 lb

## 2012-02-02 DIAGNOSIS — I2510 Atherosclerotic heart disease of native coronary artery without angina pectoris: Secondary | ICD-10-CM

## 2012-02-02 DIAGNOSIS — Z951 Presence of aortocoronary bypass graft: Secondary | ICD-10-CM

## 2012-02-02 DIAGNOSIS — Z09 Encounter for follow-up examination after completed treatment for conditions other than malignant neoplasm: Secondary | ICD-10-CM

## 2012-02-02 NOTE — Progress Notes (Signed)
[address]            [place] [address]          [phone]     CARDIOTHORACIC SURGERY OFFICE NOTE  Referring Provider is Governor Rooks, MD PCP is Nilda Simmer, MD, MD   HPI:  Patient returns for routine followup status post coronary artery bypass grafting x5 on 01/05/2012. His postoperative recovery has been completely uncomplicated. Since hospital discharge she has continued to do very well. He has not yet return to see Dr. Alanda Amass in followup, although he plans to see him later this month. Set to be level has continued to increase. He is walking every day he reports feeling quite well. He has no shortness of breath. He is only mild residual soreness in his chest and he is no longer taking any pain relievers. He artery start driving an automobile on his own and he has not had any problems. He has not been checking his blood sugars. Overall he feels quite well and has no complaints.   Current Outpatient Prescriptions  Medication Sig Dispense Refill  . aspirin EC 325 MG EC tablet Take 1 tablet (325 mg total) by mouth daily.  30 tablet    . atorvastatin (LIPITOR) 10 MG tablet Take 1 tablet (10 mg total) by mouth daily at 6 PM.  30 tablet  1  . cholecalciferol (VITAMIN D) 1000 UNITS tablet Take 1,000 Units by mouth daily.      . clonazePAM (KLONOPIN) 1 MG tablet Take 1 mg by mouth 2 (two) times daily.      Marland Kitchen esomeprazole (NEXIUM) 40 MG capsule Take 40 mg by mouth daily before breakfast.      . HYDROcodone-acetaminophen (LORTAB 7.5) 7.5-500 MG per tablet Take 1 tablet by mouth every 6 (six) hours as needed for pain (may take one or two tablets every 4-6 hrs prn).  40 tablet  0  . metFORMIN (GLUCOPHAGE) 1000 MG tablet Take 1,000 mg by mouth 2 (two) times daily with a meal.      . metoprolol tartrate (LOPRESSOR) 25 MG tablet Take 1 tablet (25 mg total) by mouth 2 (two) times daily.  60 tablet  1  . Multiple Vitamins-Minerals (CENTRUM) tablet Take  1 tablet by mouth daily.      Marland Kitchen oxyCODONE (OXY IR/ROXICODONE) 5 MG immediate release tablet Take 1-2 tablets (5-10 mg total) by mouth every 4 (four) hours as needed.  50 tablet  0  . venlafaxine XR (EFFEXOR-XR) 150 MG 24 hr capsule Take 150 mg by mouth daily.          Physical Exam:   BP 132/80  Pulse 78  Resp 16  Ht 5\' 11"  (1.803 m)  Wt 163 lb 8 oz (74.163 kg)  BMI 22.80 kg/m2  SpO2 97%  General:  Well-appearing  Chest:   Clear to auscultation with symmetrical breath sounds  CV:   Regular rate and rhythm without murmur  Incisions:  Clean and dry and healing very nicely, sternum is stable  Abdomen:  Soft and nontender  Extremities:  Warm and well-perfused with no edema  Diagnostic Tests:  *RADIOLOGY REPORT*  Clinical Data: Status post CABG  CHEST - 2 VIEW  Comparison: 01/08/2012  Findings: Cardiomediastinal silhouette is stable. Status post CABG  again noted. Mild elevation of the right hemidiaphragm. There is  trace left pleural effusion with left basilar atelectasis.  No  acute infiltrate or pulmonary edema.  IMPRESSION:  Status post CABG. Trace left pleural effusion with left basilar  atelectasis. No acute infiltrate or pulmonary edema.  Original Report Authenticated By: Natasha Mead, M.D.    Impression:  Patient is doing very well status post coronary artery bypass grafting x5.   Plan:  I've encouraged patient to continue to gradually increase his physical activity as tolerated with his only limitations remaining that he refrain from heavy lifting or strenuous use of his arms or shoulders for least another 2 months. I think it is fine for him to resume driving an automobile. I've strongly encouraged him to enroll in the cardiac rehabilitation program. We have discussed a plan for him to return to work. I think he could return to work on a limited basis later this month once he has been seen by Dr. Alanda Amass. It will be 2 months before he may return to unrestricted  physical activity. I've also reminded him how important it will remain for him to keep his diabetes and hyperlipidemia under strict control. All of his questions been addressed. In the future he will call and return to see [place] as needed.   Salvatore Decent. Cornelius Moras, MD 02/02/2012 3:08 PM

## 2012-02-02 NOTE — Patient Instructions (Signed)
The patient has been instructed that they may return driving an automobile as long as they are no longer requiring oral narcotic pain relievers during the daytime.  They have been advised to start driving short distances during the daylight and gradually increase from there as they feel comfortable.  The patient has been reminded to continue to avoid any heavy lifting or strenuous use of arms or shoulders for at least a total of three months from the time of surgery.  The patient has been encouraged to enroll in the cardiac rehab program.

## 2012-03-03 LAB — LIPID PANEL: HDL: 44 mg/dL (ref 35–70)

## 2012-03-05 ENCOUNTER — Ambulatory Visit (INDEPENDENT_AMBULATORY_CARE_PROVIDER_SITE_OTHER): Payer: BC Managed Care – PPO | Admitting: Cardiovascular Disease

## 2012-03-05 ENCOUNTER — Encounter: Payer: Self-pay | Admitting: Cardiovascular Disease

## 2012-03-05 VITALS — BP 142/70 | HR 87 | Ht 71.0 in | Wt 168.0 lb

## 2012-03-05 DIAGNOSIS — D509 Iron deficiency anemia, unspecified: Secondary | ICD-10-CM

## 2012-03-05 DIAGNOSIS — E1169 Type 2 diabetes mellitus with other specified complication: Secondary | ICD-10-CM | POA: Insufficient documentation

## 2012-03-05 DIAGNOSIS — Z951 Presence of aortocoronary bypass graft: Secondary | ICD-10-CM

## 2012-03-05 DIAGNOSIS — I10 Essential (primary) hypertension: Secondary | ICD-10-CM

## 2012-03-05 DIAGNOSIS — I25810 Atherosclerosis of coronary artery bypass graft(s) without angina pectoris: Secondary | ICD-10-CM

## 2012-03-05 DIAGNOSIS — I152 Hypertension secondary to endocrine disorders: Secondary | ICD-10-CM | POA: Insufficient documentation

## 2012-03-05 DIAGNOSIS — E785 Hyperlipidemia, unspecified: Secondary | ICD-10-CM | POA: Insufficient documentation

## 2012-03-05 NOTE — Assessment & Plan Note (Signed)
The patient has iron deficiency anemia. His hemoglobin was 10.5 with low MCV and low iron and ferritin. He is currently getting workup for this. Some of this might be post surgical blood loss is taking longer time to improve. He probably would benefit from iron supplements. If it does not improve, he might need GI workup.

## 2012-03-05 NOTE — Progress Notes (Signed)
HPI  This is a 51 year old male who is here today to establish cardiovascular care. He is transferring from [facility] cardiology. He saw Dr. Alanda Amass there. He was evaluated in April of this year for chest pain and dyspnea. He had an abnormal nuclear stress test. Cardiac catheterization showed significant three-vessel coronary artery disease with normal ejection fraction. He underwent CABG x5 by Dr. Cornelius Moras without complications. He has chronic medical conditions that include type 2 diabetes, hypertension, hyperlipidemia and previous tobacco use. He did well early on but started feeling tired and fatigue recently. He was diagnosed with iron deficiency anemia. He is following with Dr. Katrinka Blazing about that. He continues to have discomfort at the surgical site but that seems to be gradually improving. He has not started attending cardiac rehabilitation. 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 He is still on 10 pounds weight lifting limitation.  Allergies  Allergen Reactions  . Tramadol Rash     Current Outpatient Prescriptions on File Prior to Visit  Medication Sig Dispense Refill  . aspirin EC 325 MG EC tablet Take 1 tablet (325 mg total) by mouth daily.  30 tablet    . atorvastatin (LIPITOR) 10 MG tablet Take 1 tablet (10 mg total) by mouth daily at 6 PM.  30 tablet  1  . cholecalciferol (VITAMIN D) 1000 UNITS tablet Take 1,000 Units by mouth daily.      . clonazePAM (KLONOPIN) 1 MG tablet Take 1 mg by mouth 2 (two) times daily.      Marland Kitchen esomeprazole (NEXIUM) 40 MG capsule Take 40 mg by mouth daily before breakfast.      . metFORMIN (GLUCOPHAGE) 1000 MG tablet Take 1,000 mg by mouth 2 (two) times daily with a meal.      . metoprolol tartrate (LOPRESSOR) 25 MG tablet Take 1 tablet (25 mg total) by mouth 2 (two) times daily.  60 tablet  1  . Multiple Vitamins-Minerals (CENTRUM) tablet Take 1 tablet by mouth daily.      Marland Kitchen oxyCODONE (OXY IR/ROXICODONE) 5 MG immediate  release tablet Take 1-2 tablets (5-10 mg total) by mouth every 4 (four) hours as needed.  50 tablet  0  . venlafaxine XR (EFFEXOR-XR) 150 MG 24 hr capsule Take 150 mg by mouth daily.         Past Medical History  Diagnosis Date  . Unstable angina 01/02/2012  . Cardiovascular stress test abnormal 01/02/2012  . Type II diabetes mellitus   . Tobacco abuse   . S/P CABG x 5 01/05/2012    LIMA to LAD, SVG to D2, SVG to OM, sequential SVG to RCA and PDA, EVH via right thigh and leg  . GERD (gastroesophageal reflux disease)   . CAD (coronary artery disease), severe multivessel CAD surgical consult for CABG 01/02/2012    3 vessel CAD with normal EF. s/p CABG x 5  . Hypertension   . Hyperlipidemia      Past Surgical History  Procedure Date  . Shoulder surgery 2011    lt rotator cuff  . Cardiac catheterization 4 /19/ 2013    [place]   . Coronary artery bypass graft 01/05/2012    Procedure: CORONARY ARTERY BYPASS GRAFTING (CABG);  Surgeon: Purcell Nails, MD;  Location: [facility] OR;  Service: Open Heart Surgery;  Laterality: N/A;  coronary artery bypass graft times five using left internal mammary artery and right leg greater saphenous vein harvested endoscopically      Family History  Problem Relation Age of Onset  . Heart attack Mother   . Hypertension Mother   . Hyperlipidemia Mother   . Hypertension Father   . Heart disease Father   . Hyperlipidemia Father   . Hypertension Sister      History   Social History  . Marital Status: Single    Spouse Name: N/A    Number of Children: N/A  . Years of Education: N/A   Occupational History  . Not on file.   Social History Main Topics  . Smoking status: Former Smoker -- 1.5 packs/day for 35 years    Types: Cigarettes    Quit date: 11/14/2011  . Smokeless tobacco: Not on file  . Alcohol Use: Yes     Drinks a 12 pack on Saturday  . Drug Use: No  . Sexually Active: Not on file   Other Topics Concern  . Not on file   Social  History Narrative  . No narrative on file     ROS Constitutional: Negative for fever, chills, diaphoresis, activity change, appetite change and fatigue.  HENT: Negative for hearing loss, nosebleeds, congestion, sore throat, facial swelling, drooling, trouble swallowing, neck pain, voice change, sinus pressure and tinnitus.  Eyes: Negative for photophobia, pain, discharge and visual disturbance.  Respiratory: Negative for apnea, cough, chest tightness, shortness of breath and wheezing.  Cardiovascular: Negative for chest pain, palpitations and leg swelling.  Gastrointestinal: Negative for nausea, vomiting, abdominal pain, diarrhea, constipation, blood in stool and abdominal distention.  Genitourinary: Negative for dysuria, urgency, frequency, hematuria and decreased urine volume.  Musculoskeletal: Negative for myalgias, back pain, joint swelling, arthralgias and gait problem.  Skin: Negative for color change, pallor, rash and wound.  Neurological: Negative for dizziness, tremors, seizures, syncope, speech difficulty, weakness, light-headedness, numbness and headaches.  Psychiatric/Behavioral: Negative for suicidal ideas, hallucinations, behavioral problems and agitation. The patient is not nervous/anxious.     PHYSICAL EXAM   BP 142/70  Pulse 87  Ht 5\' 11"  (1.803 m)  Wt 168 lb (76.204 kg)  BMI 23.43 kg/m2   EKG:   ASSESSMENT AND PLAN

## 2012-03-05 NOTE — Assessment & Plan Note (Signed)
The patient seems to be progressing reasonably well. He has no clear symptoms of angina. He has nonspecific complaints of fatigue. He is substernal discomfort at the surgical site seems to be gradually improving. If this continues to be an issue, we should extend his restriction on no lifting of more than 10 pounds for a longer duration. He will let us know if this is still an issue by next month when that restriction is due to expire.  I advised him to start cardiac rehabilitation.

## 2012-03-05 NOTE — Assessment & Plan Note (Signed)
Continue treatment with atorvastatin. I recommend a target LDL of less than 70. 

## 2012-03-05 NOTE — Assessment & Plan Note (Signed)
His blood pressure is mildly elevated, continue to monitor and consider adding an ACE inhibitor given that he is diabetic.

## 2012-03-05 NOTE — Patient Instructions (Addendum)
Continue same medications.  Start cardiac rehab once ready.  Follow up in 3 months.

## 2012-03-15 ENCOUNTER — Other Ambulatory Visit: Payer: Self-pay | Admitting: *Deleted

## 2012-03-15 MED ORDER — METOPROLOL TARTRATE 25 MG PO TABS: 25.0000 mg | ORAL_TABLET | Freq: Two times a day (BID) | ORAL | Status: DC

## 2012-03-29 ENCOUNTER — Telehealth: Payer: Self-pay | Admitting: Cardiovascular Disease

## 2012-03-29 NOTE — Telephone Encounter (Signed)
Pt says he has resumed full activity at his Job after CABG in April.  His job requires him to crawl around on concrete as well as a good amt of climbing.  He did this last Friday 7/12. Since then he has been very sore at sternal incision and unable to move without having sternal pain.  He wonders if his light duty restrictions should be extended.  His job will allow for this.  After reviewing Dr. Jari Sportsman last office note I explained to pt we could extend his light duty restrictions and will leave note for him at [facility] for him to pick up.  Understanding verb.

## 2012-03-29 NOTE — Telephone Encounter (Signed)
Patient stopped by office with papers to be filled out for his work.  He noted last week he think he over did it at work and it having increased sternum pain.  Per Dr. Jari Sportsman last office note: He is substernal discomfort at the surgical site seems to be gradually improving. If this continues to be an issue, we should extend his restriction on no lifting of more than 10 pounds for a longer duration. He will let us know if this is still an issue by next month when that restriction is due to expire.  Does he need an appointment to be seen?

## 2012-05-18 ENCOUNTER — Other Ambulatory Visit: Payer: Self-pay | Admitting: Family Medicine

## 2012-05-27 ENCOUNTER — Encounter: Payer: Self-pay | Admitting: *Deleted

## 2012-05-31 ENCOUNTER — Ambulatory Visit (INDEPENDENT_AMBULATORY_CARE_PROVIDER_SITE_OTHER): Payer: BC Managed Care – PPO | Admitting: Family Medicine

## 2012-05-31 ENCOUNTER — Encounter: Payer: Self-pay | Admitting: Family Medicine

## 2012-05-31 VITALS — BP 137/88 | HR 85 | Temp 98.4°F | Resp 16 | Ht 70.0 in | Wt 172.0 lb

## 2012-05-31 DIAGNOSIS — E782 Mixed hyperlipidemia: Secondary | ICD-10-CM

## 2012-05-31 DIAGNOSIS — D509 Iron deficiency anemia, unspecified: Secondary | ICD-10-CM

## 2012-05-31 DIAGNOSIS — E559 Vitamin D deficiency, unspecified: Secondary | ICD-10-CM

## 2012-05-31 DIAGNOSIS — F418 Other specified anxiety disorders: Secondary | ICD-10-CM

## 2012-05-31 DIAGNOSIS — E119 Type 2 diabetes mellitus without complications: Secondary | ICD-10-CM

## 2012-05-31 DIAGNOSIS — E7800 Pure hypercholesterolemia, unspecified: Secondary | ICD-10-CM

## 2012-05-31 DIAGNOSIS — I2510 Atherosclerotic heart disease of native coronary artery without angina pectoris: Secondary | ICD-10-CM

## 2012-05-31 LAB — IRON AND TIBC
Iron: 112 ug/dL (ref 42–165)
UIBC: 246 ug/dL (ref 125–400)

## 2012-05-31 LAB — COMPREHENSIVE METABOLIC PANEL
AST: 24 U/L (ref 0–37)
Albumin: 4.5 g/dL (ref 3.5–5.2)
Alkaline Phosphatase: 106 U/L (ref 39–117)
Potassium: 4.5 mEq/L (ref 3.5–5.3)
Sodium: 137 mEq/L (ref 135–145)
Total Protein: 7.1 g/dL (ref 6.0–8.3)

## 2012-05-31 LAB — CBC WITH DIFFERENTIAL/PLATELET
Basophils Absolute: 0.1 10*3/uL (ref 0.0–0.1)
HCT: 41.1 % (ref 39.0–52.0)
Lymphocytes Relative: 25 % (ref 12–46)
Monocytes Absolute: 0.4 10*3/uL (ref 0.1–1.0)
Neutro Abs: 4.4 10*3/uL (ref 1.7–7.7)
Platelets: 355 10*3/uL (ref 150–400)
RBC: 5.38 MIL/uL (ref 4.22–5.81)
RDW: 19.5 % — ABNORMAL HIGH (ref 11.5–15.5)
WBC: 7 10*3/uL (ref 4.0–10.5)

## 2012-05-31 LAB — LIPID PANEL

## 2012-05-31 MED ORDER — METFORMIN HCL 1000 MG PO TABS: 1000.0000 mg | ORAL_TABLET | Freq: Two times a day (BID) | ORAL | Status: DC

## 2012-05-31 NOTE — Progress Notes (Signed)
Reviewed and agree.

## 2012-05-31 NOTE — Progress Notes (Signed)
Subjective:    Patient ID: Joseph Terry, male    DOB: 1960-10-10, 51 y.o.   MRN: [id number]  HPIThis 51 y.o. male presents for evaluation of the following:  1.  Coronary Artery Disease:  Three month follow-up on CAD.   Follow up with Arida on 06/10/12.  Has knot along incision line.  No recurrent chest pain, jaw pain, SOB.  Suffering with persistent fatigue.  Able to play with granddaughter over the weekend with no symptoms.   Working early hours at work; no energy to exercise. No tobacco 12/2011.  S/p Chantix.  2.  DMII: Three month follow-up on DMII.  Last HgbA1c of 6.5 in 02/2012. No changes in management made at last visit; good tolerance of symptoms; good symptom control.   Not checking sugars; not interested in flu vaccine.  Pneumovax 2006.  Refuses flu vaccines.  Monofilament in 11/2011 normal.  Denies polyuria, polydipsia, weight changes. +fatigue.  3.  Hyperlipidemia: Three month follow-up on hyperlipidemia.  No change in management made at last visit.     Fasting; due for labs.  Denies chest pain, palpitations, shortness of breath, leg swelling.    4.  Anxiety:  Having temper problems for a few months; has settled down; feels due to fatigue.  Unable to multitask and now struggling; minimal patience in general.  Emotionally better now with less anger. Denies SI.  Compliance with medications; good tolerance to medications; good symptom control.    5.  Anemia Iron Deficiency: three month follow-up. Management at last visit included starting OTC ferrous sulfate 325mg  one daily.   Stool cards negative at last visit.  Last EGD and colonoscopy 2009.    Taking ferrous sulfate once daily.  Also taking B complex bid.  No further dizziness.  Denies abdominal pain, nausea, vomiting, bloody or melanotic stools.  No change in bowel habits.  GERD under good control with Nexium.  6.  Vitamin D deficiency: increased vitamin D to bid after last visit.     Review of Systems  Constitutional: Positive for  fatigue. Negative for chills, activity change and appetite change.  Respiratory: Negative for cough, chest tightness, shortness of breath and wheezing.   Cardiovascular: Negative for chest pain, palpitations and leg swelling.  Gastrointestinal: Negative for nausea, vomiting, abdominal pain, diarrhea, constipation, blood in stool and rectal pain.  Neurological: Negative for dizziness, syncope, weakness, light-headedness, numbness and headaches.  Psychiatric/Behavioral: Positive for dysphoric mood and agitation. Negative for suicidal ideas, disturbed wake/sleep cycle and self-injury. The patient is not nervous/anxious.         Past Medical History  Diagnosis Date  . Unstable angina 01/02/2012  . Cardiovascular stress test abnormal 01/02/2012  . Type II diabetes mellitus   . Tobacco abuse   . S/P CABG x 5 01/05/2012    LIMA to LAD, SVG to D2, SVG to OM, sequential SVG to RCA and PDA, EVH via right thigh and leg  . GERD (gastroesophageal reflux disease)   . Hypertension   . Hyperlipidemia   . CAD (coronary artery disease), severe multivessel CAD surgical consult for CABG 01/02/2012    3 vessel CAD with normal EF. s/p CABG x 5: LIMA to LAD, SVG to distal RCA with sequential SVG to PDA, SVG to OM, and SVG to second diagonal  . Unspecified vitamin D deficiency   . Anemia   . Communicable disease   . Cervicalgia   . Jaw pain   . Anxiety state, unspecified   . Impotence of organic origin   .  Internal hemorrhoids without mention of complication   . Personal history of colonic polyps   . Diaphragmatic hernia without mention of obstruction or gangrene     Past Surgical History  Procedure Date  . Rotator cuff curgery 2011    lt rotator cuff  . Cardiac catheterization 4 /19/ 2013    [place]   . Coronary artery bypass graft 01/05/2012    Procedure: CORONARY ARTERY BYPASS GRAFTING (CABG);  Surgeon: Purcell Nails, MD;  Location: [facility] OR;  Service: Open Heart Surgery;  Laterality: N/A;   coronary artery bypass graft times five using left internal mammary artery and right leg greater saphenous vein harvested endoscopically   . Shoulder surgery 1997    bilateral    Prior to Admission medications   Medication Sig Start Date End Date Taking? Authorizing Provider  aspirin EC 325 MG EC tablet Take 1 tablet (325 mg total) by mouth daily. 01/08/12  Yes Rowe Clack, PA  atorvastatin (LIPITOR) 10 MG tablet Take 1 tablet (10 mg total) by mouth daily at 6 PM. 01/08/12 01/07/13 Yes Rowe Clack, PA  cholecalciferol (VITAMIN D) 1000 UNITS tablet Take 1,000 Units by mouth daily.   Yes Historical Provider, MD  clonazePAM (KLONOPIN) 1 MG tablet Take 1 mg by mouth 2 (two) times daily.   Yes Historical Provider, MD  esomeprazole (NEXIUM) 40 MG capsule Take 40 mg by mouth daily before breakfast.   Yes Historical Provider, MD  metFORMIN (GLUCOPHAGE) 1000 MG tablet Take 1 tablet (1,000 mg total) by mouth 2 (two) times daily with a meal. 05/31/12  Yes Ethelda Chick, MD  metoprolol tartrate (LOPRESSOR) 25 MG tablet Take 1 tablet (25 mg total) by mouth 2 (two) times daily. 03/15/12 03/15/13 Yes Iran Ouch, MD  Multiple Vitamins-Minerals (CENTRUM) tablet Take 1 tablet by mouth daily. 01/08/12 01/07/13 Yes Wayne E Gold, PA  venlafaxine XR (EFFEXOR-XR) 150 MG 24 hr capsule Take 150 mg by mouth daily.   Yes Historical Provider, MD  vitamin B-12 (CYANOCOBALAMIN) 1000 MCG tablet Take 1,000 mcg by mouth 2 (two) times daily.   Yes Historical Provider, MD  oxyCODONE (OXY IR/ROXICODONE) 5 MG immediate release tablet Take 1-2 tablets (5-10 mg total) by mouth every 4 (four) hours as needed. 01/08/12   Rowe Clack, PA    Allergies  Allergen Reactions  . Penicillins Itching    tingling  . Tramadol Rash    History   Social History  . Marital Status: Single    Spouse Name: N/A    Number of Children: 1  . Years of Education: N/A   Occupational History  . [employer]     x 30 yrs   Social History  Main Topics  . Smoking status: Former Smoker -- 1.5 packs/day for 35 years    Types: Cigarettes    Quit date: 11/14/2011  . Smokeless tobacco: Not on file   Comment: quit 2 weeks prior to CABG 12/2011  . Alcohol Use: Yes     Drinks a 12 pack on Saturday  . Drug Use: No  . Sexually Active: Not on file   Other Topics Concern  . Not on file   Social History Narrative   Lives alone. Sexual History: 20; Last HIV testing 1996 with divorce; has has 20 partners since divorce. One son and a granddaughter 75 mo old. Exercise: Light, yard work; playing with son's puppy. No formal exercise. Works 65 - 70 hrs. Some weeks. Guns in the home  not stored in locked cabinet. Smoke alarm in home, Always wears seatbelts.    Family History  Problem Relation Age of Onset  . Heart attack Mother     defribillator in place  . Hypertension Mother   . Hyperlipidemia Mother   . Hypertension Father   . Heart disease Father   . Hyperlipidemia Father   . Cancer Father 18     prostate    implants  . Hypertension Sister     Objective:   Physical Exam  Nursing note and vitals reviewed. Constitutional: He is oriented to person, place, and time. He appears well-developed and well-nourished. No distress.  Eyes: Conjunctivae normal and EOM are normal. Pupils are equal, round, and reactive to light.  Neck: Normal range of motion. Neck supple. No thyromegaly present.  Cardiovascular: Normal rate, regular rhythm, normal heart sounds and intact distal pulses.   No murmur heard. Pulmonary/Chest: Effort normal and breath sounds normal. No respiratory distress. He has no wheezes. He has no rales.  Abdominal: Soft. Bowel sounds are normal. He exhibits no distension. There is no tenderness. There is no rebound and no guarding.  Lymphadenopathy:    He has no cervical adenopathy.  Neurological: He is alert and oriented to person, place, and time. No cranial nerve deficit.  Skin: Skin is warm and dry. He is not diaphoretic.        WELL HEALED INCISION MIDLINE CHEST. +SMALL PALPABLE NODULE UPPER INCISION/SCAR CONSISTENT WITH STERNOTOMY WIRING.  Psychiatric: He has a normal mood and affect. His behavior is normal. Judgment and thought content normal.       Assessment & Plan:   1. Type II or unspecified type diabetes mellitus without mention of complication, not stated as uncontrolled  POCT glycosylated hemoglobin (Hb A1C), metFORMIN (GLUCOPHAGE) 1000 MG tablet  2. Mixed hyperlipidemia  Lipid panel  3. Iron deficiency anemia, unspecified  CBC with Differential, CK, Comprehensive metabolic panel, Iron and TIBC  4. Unspecified vitamin D deficiency  Vitamin D 25 hydroxy  5.  Coronary Artery Disease 6. Depression with Anxiety  1.  DMII:  Controlled; no change in medications; obtain labs ;continue current medications.  Follow-up in three months.  Declined flu vaccine. 2.  Hyperlipidemia:  Controlled; goal LDL<70 due to CAD; obtain labs; continue current medication. 3.  Iron deficiency anemia:  Persistent; tolerating OTC ferrous sulfate 325mg  once daily; stool cards negative in 02/2012. 4.  Vitamin D deficiency:  Uncontrolled; tolerating higher dose of medication without side effects; no change in medication; obtain labs. 5.  Coronary Artery Disease:  Stable; asymptomatic other than persistent fatigue; follow-up with Arida in two weeks. 6.  Depression with Anxiety:  Worsening over past three months with worsening anger; improvement recently.  No change in management at this time.

## 2012-06-01 ENCOUNTER — Encounter: Payer: Self-pay | Admitting: Family Medicine

## 2012-06-01 LAB — VITAMIN D 25 HYDROXY (VIT D DEFICIENCY, FRACTURES): Vit D, 25-Hydroxy: 37 ng/mL (ref 30–89)

## 2012-06-10 ENCOUNTER — Ambulatory Visit (INDEPENDENT_AMBULATORY_CARE_PROVIDER_SITE_OTHER): Payer: BC Managed Care – PPO | Admitting: Cardiovascular Disease

## 2012-06-10 ENCOUNTER — Encounter: Payer: Self-pay | Admitting: Cardiovascular Disease

## 2012-06-10 VITALS — BP 132/80 | HR 75 | Ht 71.0 in | Wt 179.8 lb

## 2012-06-10 DIAGNOSIS — R0600 Dyspnea, unspecified: Secondary | ICD-10-CM

## 2012-06-10 DIAGNOSIS — Z951 Presence of aortocoronary bypass graft: Secondary | ICD-10-CM

## 2012-06-10 DIAGNOSIS — R0609 Other forms of dyspnea: Secondary | ICD-10-CM

## 2012-06-10 DIAGNOSIS — E785 Hyperlipidemia, unspecified: Secondary | ICD-10-CM

## 2012-06-10 DIAGNOSIS — I10 Essential (primary) hypertension: Secondary | ICD-10-CM

## 2012-06-10 MED ORDER — ATORVASTATIN CALCIUM 40 MG PO TABS: 40.0000 mg | ORAL_TABLET | Freq: Every day | ORAL | Status: DC

## 2012-06-10 NOTE — Progress Notes (Signed)
HPI  This is a 51 year old male who is here today for a followup visit.   He was evaluated at [facility] cardiology in April of this year for chest pain and dyspnea. He had an abnormal nuclear stress test. Cardiac catheterization showed significant three-vessel coronary artery disease with normal ejection fraction. He underwent CABG x5 by Dr. Cornelius Moras without complications. He has chronic medical conditions that include type 2 diabetes, hypertension, hyperlipidemia and previous tobacco use. He did well early on but started feeling tired and fatigue recently. He was diagnosed with iron deficiency anemia. He was placed on supplement and had significant improvement. He was evaluated by his primary care physician Dr. Katrinka Blazing recently. Followup labs showed resolution of  anemia with a hemoglobin of 13. He continues to complain of fatigue with mild dyspnea but no chest tightness. He has chronic discomfort at the surgical site. His recent lipid profile showed elevated triglyceride of 400. He has been eating more carbohydrates recently. He was started on fish oil and thousand milligrams twice daily by Dr. Katrinka Blazing. 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   Allergies  Allergen Reactions  . Penicillins Itching    tingling  . Tramadol Rash     Current Outpatient Prescriptions on File Prior to Visit  Medication Sig Dispense Refill  . aspirin EC 325 MG EC tablet Take 1 tablet (325 mg total) by mouth daily.  30 tablet    . cholecalciferol (VITAMIN D) 1000 UNITS tablet Take 1,000 Units by mouth daily.      . clonazePAM (KLONOPIN) 1 MG tablet Take 1 mg by mouth 2 (two) times daily.      Marland Kitchen esomeprazole (NEXIUM) 40 MG capsule Take 40 mg by mouth daily before breakfast.      . metFORMIN (GLUCOPHAGE) 1000 MG tablet Take 1 tablet (1,000 mg total) by mouth 2 (two) times daily with a meal.  60 tablet  11  . metoprolol tartrate (LOPRESSOR) 25 MG tablet Take 1 tablet (25 mg total) by mouth 2 (two)  times daily.  60 tablet  6  . Multiple Vitamins-Minerals (CENTRUM) tablet Take 1 tablet by mouth daily.      Marland Kitchen venlafaxine XR (EFFEXOR-XR) 150 MG 24 hr capsule Take 150 mg by mouth daily.      . vitamin B-12 (CYANOCOBALAMIN) 1000 MCG tablet Take 1,000 mcg by mouth 2 (two) times daily.         Past Medical History  Diagnosis Date  . Unstable angina 01/02/2012  . Cardiovascular stress test abnormal 01/02/2012  . Type II diabetes mellitus   . Tobacco abuse   . S/P CABG x 5 01/05/2012    LIMA to LAD, SVG to D2, SVG to OM, sequential SVG to RCA and PDA, EVH via right thigh and leg  . GERD (gastroesophageal reflux disease)   . Hypertension   . Hyperlipidemia   . CAD (coronary artery disease), severe multivessel CAD surgical consult for CABG 01/02/2012    3 vessel CAD with normal EF. s/p CABG x 5: LIMA to LAD, SVG to distal RCA with sequential SVG to PDA, SVG to OM, and SVG to second diagonal  . Unspecified vitamin D deficiency   . Anemia   . Communicable disease   . Cervicalgia   . Jaw pain   . Anxiety state, unspecified   . Impotence of organic origin   . Internal hemorrhoids without mention of complication   . Personal history of colonic polyps   .  Diaphragmatic hernia without mention of obstruction or gangrene      Past Surgical History  Procedure Date  . Rotator cuff curgery 2011    lt rotator cuff  . Cardiac catheterization 4 /19/ 2013    [facility]   . Coronary artery bypass graft 01/05/2012    Procedure: CORONARY ARTERY BYPASS GRAFTING (CABG);  Surgeon: Purcell Nails, MD;  Location: [facility] OR;  Service: Open Heart Surgery;  Laterality: N/A;  coronary artery bypass graft times five using left internal mammary artery and right leg greater saphenous vein harvested endoscopically   . Shoulder surgery 1997    bilateral     Family History  Problem Relation Age of Onset  . Heart attack Mother     defribillator in place  . Hypertension Mother   . Hyperlipidemia Mother   .  Hypertension Father   . Heart disease Father   . Hyperlipidemia Father   . Cancer Father 11     prostate    implants  . Hypertension Sister      History   Social History  . Marital Status: Single    Spouse Name: N/A    Number of Children: 1  . Years of Education: N/A   Occupational History  . [employer]     x 30 yrs   Social History Main Topics  . Smoking status: Former Smoker -- 1.5 packs/day for 35 years    Types: Cigarettes    Quit date: 11/14/2011  . Smokeless tobacco: Not on file   Comment: quit 2 weeks prior to CABG 12/2011  . Alcohol Use: Yes     Drinks a 12 pack on Saturday  . Drug Use: No  . Sexually Active: Not on file   Other Topics Concern  . Not on file   Social History Narrative   Lives alone. Sexual History: 20; Last HIV testing 1996 with divorce; has has 20 partners since divorce. One son and a granddaughter 87 mo old. Exercise: Light, yard work; playing with son's puppy. No formal exercise. Works 65 - 70 hrs. Some weeks. Guns in the home not stored in locked cabinet. Smoke alarm in home, Always wears seatbelts.     PHYSICAL EXAM   BP 132/80  Pulse 75  Ht 5\' 11"  (1.803 m)  Wt 179 lb 12 oz (81.534 kg)  BMI 25.07 kg/m2 Constitutional: He is oriented to person, place, and time. He appears well-developed and well-nourished. No distress.  HENT: No nasal discharge.  Head: Normocephalic and atraumatic.  Eyes: Pupils are equal and round. Right eye exhibits no discharge. Left eye exhibits no discharge.  Neck: Normal range of motion. Neck supple. No JVD present. No thyromegaly present.  Cardiovascular: Normal rate, regular rhythm, normal heart sounds and. Exam reveals no gallop and no friction rub. No murmur heard.  Pulmonary/Chest: Effort normal and breath sounds normal. No stridor. No respiratory distress. He has no wheezes. He has no rales. He exhibits no tenderness.  Abdominal: Soft. Bowel sounds are normal. He exhibits no distension. There is no  tenderness. There is no rebound and no guarding.  Musculoskeletal: Normal range of motion. He exhibits no edema and no tenderness.  Neurological: He is alert and oriented to person, place, and time. Coordination normal.  Skin: Skin is warm and dry. No rash noted. He is not diaphoretic. No erythema. No pallor.  Psychiatric: He has a normal mood and affect. His behavior is normal. Judgment and thought content normal.  EKG: Sinus  Rhythm  -With rate variation  cv = 10. WITHIN NORMAL LIMITS   ASSESSMENT AND PLAN

## 2012-06-10 NOTE — Patient Instructions (Addendum)
Increase Atorvastatin to 40 mg daily.  Fasting labs in 6 weeks.   Your physician has requested that you have an exercise tolerance test. For further information please visit [email or web address] Please also follow instruction sheet, as given.  Hold Metoprolol the night before and morning of stress test.   Follow up in 6 months.

## 2012-06-10 NOTE — Assessment & Plan Note (Signed)
His blood pressure is well controlled. Continue current medications. 

## 2012-06-10 NOTE — Assessment & Plan Note (Signed)
Lab Results  Component Value Date   CHOL 148 05/31/2012   HDL 38* 05/31/2012   LDLCALC Comment:   Not calculated due to Triglyceride >400. Suggest ordering Direct LDL (Unit Code: 45409).   Total Cholesterol/HDL Ratio:CHD Risk                        Coronary Heart Disease Risk Table                                        Men       Women          1/2 Average Risk              3.4        3.3              Average Risk              5.0        4.4           2X Average Risk              9.6        7.1           3X Average Risk             23.4       11.0 Use the calculated Patient Ratio above and the CHD Risk table  to determine the patient's CHD Risk. ATP III Classification (LDL):       < 100        mg/dL         Optimal      811 - 129     mg/dL         Near or Above Optimal      130 - 159     mg/dL         Borderline High      160 - 189     mg/dL         High       > 914        mg/dL         Very High   7/82/9562   TRIG 419* 05/31/2012   CHOLHDL 3.9 05/31/2012   I discussed with the patient the importance of lifestyle changes, decreasing carbohydrates as well as regular exercise. He was already started on fish oil. I will also increase the dose of atorvastatin 40 mg once daily given that he has established history of coronary artery disease. I will repeat fasting lipid and liver profile in 6 weeks.

## 2012-06-10 NOTE — Assessment & Plan Note (Signed)
Overall he seems to be doing well. However, he continues to complain of fatigue and mild dyspnea in spite of correcting his anemia. There might be an element of physical deconditioning. I recommend evaluation with a treadmill stress test before recommending an exercise program.

## 2012-06-11 ENCOUNTER — Telehealth: Payer: Self-pay

## 2012-06-11 NOTE — Telephone Encounter (Signed)
I would see no reason but I would have the patient check with cards to make sure they do not recommend.

## 2012-06-11 NOTE — Telephone Encounter (Signed)
Misty Stanley from dental works states that pt is scheduled to have a dental cleaning on Monday but has recently had heart surgery. She would like to know if pt would need to have antibiotic pre med before dental procedures are done. Best# [phone] Misty Stanley)

## 2012-06-14 NOTE — Telephone Encounter (Signed)
I called to advise cardiology would need to advise on ABX prior to dental procedure. The cardiologist is Dr Cornelius Moras.

## 2012-06-16 ENCOUNTER — Encounter: Payer: Self-pay | Admitting: Family Medicine

## 2012-07-05 ENCOUNTER — Encounter: Payer: Self-pay | Admitting: Cardiovascular Disease

## 2012-07-05 ENCOUNTER — Ambulatory Visit (INDEPENDENT_AMBULATORY_CARE_PROVIDER_SITE_OTHER): Payer: BC Managed Care – PPO | Admitting: Cardiovascular Disease

## 2012-07-05 VITALS — BP 122/74 | HR 96 | Ht 71.0 in | Wt 177.2 lb

## 2012-07-05 DIAGNOSIS — R0600 Dyspnea, unspecified: Secondary | ICD-10-CM

## 2012-07-05 DIAGNOSIS — R0989 Other specified symptoms and signs involving the circulatory and respiratory systems: Secondary | ICD-10-CM

## 2012-07-05 DIAGNOSIS — R0609 Other forms of dyspnea: Secondary | ICD-10-CM

## 2012-07-05 NOTE — Procedures (Signed)
    Treadmill Stress test  Indication: Dyspnea and fatigue with known history of coronary artery disease.  Baseline Data:  Resting EKG shows NSR with rate of 88 bpm, nonspecific ST changes. Resting blood pressure of 122/74 mm Hg Stand bruce protocal was used.  Exercise Data:  Patient exercised for 4 min 23 sec,  Peak heart rate of 160 bpm.  This was 95 % of the maximum predicted heart rate. No symptoms of chest pain or lightheadedness were reported at peak stress or in recovery.  Peak Blood pressure recorded was 184/72 Maximal work level: 7 METs.  Heart rate at 3 minutes in recovery was 122 bpm. BP response: Normal HR response: Accelerated.  EKG with Exercise: Sinus tachycardia with 0.5 mm horizontal ST depression in the inferior as well as V5 and V6.  FINAL IMPRESSION: Normal exercise stress test. Borderline but not diagnostic EKG changes  for ischemia. Poor exercise tolerance.  Recommendation: There is evidence of deconditioned heart rate response to exercise. The EKG changes are not diagnostic for ischemia. The patient had no chest pain during the test. I advised him to start an exercise program with a target heart rate between 125-140.

## 2012-07-05 NOTE — Patient Instructions (Addendum)
Your stress test is normal.  Start exercising at least 4 times a week with a target heart rate of 125-140  Follow up in 3 months.

## 2012-07-26 ENCOUNTER — Ambulatory Visit (INDEPENDENT_AMBULATORY_CARE_PROVIDER_SITE_OTHER): Payer: BC Managed Care – PPO

## 2012-07-26 DIAGNOSIS — E785 Hyperlipidemia, unspecified: Secondary | ICD-10-CM

## 2012-07-27 LAB — LIPID PANEL
Cholesterol, Total: 138 mg/dL (ref 100–199)
Triglycerides: 204 mg/dL — ABNORMAL HIGH (ref 0–149)
VLDL Cholesterol Cal: 41 mg/dL — ABNORMAL HIGH (ref 5–40)

## 2012-07-27 LAB — HEPATIC FUNCTION PANEL
ALT: 58 IU/L — ABNORMAL HIGH (ref 0–44)
AST: 37 IU/L (ref 0–40)
Albumin: 4.5 g/dL (ref 3.5–5.5)
Alkaline Phosphatase: 103 IU/L (ref 39–117)
Total Bilirubin: 0.3 mg/dL (ref 0.0–1.2)

## 2012-08-30 ENCOUNTER — Encounter: Payer: Self-pay | Admitting: Family Medicine

## 2012-08-30 ENCOUNTER — Ambulatory Visit (INDEPENDENT_AMBULATORY_CARE_PROVIDER_SITE_OTHER): Payer: BC Managed Care – PPO | Admitting: Family Medicine

## 2012-08-30 VITALS — BP 160/82 | HR 69 | Temp 98.7°F | Resp 16 | Ht 70.0 in | Wt 176.2 lb

## 2012-08-30 DIAGNOSIS — I25709 Atherosclerosis of coronary artery bypass graft(s), unspecified, with unspecified angina pectoris: Secondary | ICD-10-CM | POA: Insufficient documentation

## 2012-08-30 DIAGNOSIS — I10 Essential (primary) hypertension: Secondary | ICD-10-CM

## 2012-08-30 DIAGNOSIS — E119 Type 2 diabetes mellitus without complications: Secondary | ICD-10-CM

## 2012-08-30 DIAGNOSIS — F418 Other specified anxiety disorders: Secondary | ICD-10-CM

## 2012-08-30 DIAGNOSIS — D509 Iron deficiency anemia, unspecified: Secondary | ICD-10-CM

## 2012-08-30 DIAGNOSIS — I2510 Atherosclerotic heart disease of native coronary artery without angina pectoris: Secondary | ICD-10-CM

## 2012-08-30 DIAGNOSIS — E7800 Pure hypercholesterolemia, unspecified: Secondary | ICD-10-CM

## 2012-08-30 DIAGNOSIS — E785 Hyperlipidemia, unspecified: Secondary | ICD-10-CM

## 2012-08-30 LAB — CBC WITH DIFFERENTIAL/PLATELET
Basophils Absolute: 0.1 10*3/uL (ref 0.0–0.1)
Basophils Relative: 1 % (ref 0–1)
Eosinophils Absolute: 0.7 10*3/uL (ref 0.0–0.7)
MCH: 27.2 pg (ref 26.0–34.0)
MCHC: 34.1 g/dL (ref 30.0–36.0)
Neutro Abs: 4.7 10*3/uL (ref 1.7–7.7)
Neutrophils Relative %: 57 % (ref 43–77)
Platelets: 353 10*3/uL (ref 150–400)

## 2012-08-30 LAB — LIPID PANEL
Cholesterol: 128 mg/dL (ref 0–200)
HDL: 36 mg/dL — ABNORMAL LOW (ref >39)
Triglycerides: 214 mg/dL — ABNORMAL HIGH (ref <150)

## 2012-08-30 LAB — COMPREHENSIVE METABOLIC PANEL
ALT: 50 U/L (ref 0–53)
AST: 29 U/L (ref 0–37)
Alkaline Phosphatase: 95 U/L (ref 39–117)
Creat: 0.75 mg/dL (ref 0.50–1.35)
Total Bilirubin: 0.4 mg/dL (ref 0.3–1.2)

## 2012-08-30 LAB — IRON AND TIBC
%SAT: 19 % — ABNORMAL LOW (ref 20–55)
Iron: 72 ug/dL (ref 42–165)
TIBC: 381 ug/dL (ref 215–435)
UIBC: 309 ug/dL (ref 125–400)

## 2012-08-30 LAB — HEMOGLOBIN A1C: Hgb A1c MFr Bld: 7 % — ABNORMAL HIGH (ref <5.7)

## 2012-08-30 LAB — FERRITIN: Ferritin: 20 ng/mL — ABNORMAL LOW (ref 22–322)

## 2012-08-30 LAB — CK: Total CK: 28 U/L (ref 7–232)

## 2012-08-30 NOTE — Assessment & Plan Note (Signed)
Controlled; no change in management; obtain labs. 

## 2012-08-30 NOTE — Assessment & Plan Note (Signed)
Uncontrolled today; recent readings normal; if still elevated at cardiology visit in upcoming month, will warrant medication adjustments.  Obtain labs.

## 2012-08-30 NOTE — Patient Instructions (Addendum)
1. Type II or unspecified type diabetes mellitus without mention of complication, not stated as uncontrolled  CBC with Differential, Hemoglobin A1c  2. Essential hypertension, benign  CK, Comprehensive metabolic panel  3. Pure hypercholesterolemia  Lipid panel  4. Iron deficiency anemia, unspecified  Iron and TIBC, Ferritin  5. Coronary artery disease

## 2012-08-30 NOTE — Assessment & Plan Note (Signed)
Stable; s/p recent cardiolite by cardiology.  Has started exercise program at home.  Upcoming cardiology appointment.

## 2012-08-30 NOTE — Assessment & Plan Note (Signed)
Worsening since recent CABG.  Slowly improving. Counseling provided during visit.  No changes to management today.

## 2012-08-30 NOTE — Progress Notes (Signed)
[address]   [place], [place]  [address]   [phone]  Subjective:    Patient ID: Joseph Terry, male    DOB: [birth year], 51 y.o.   MRN: [id number]  HPIThis 52 y.o. male presents for three month follow-up:  1. Anemia:  Three month follow-up; Hgb at last visit of 13.0.  Stopped iron after three months; finished iron supplement recently.  No bloody or melanotic stools.  Was taking Vitamin B, D, and iron at bedtime and MVI.  Now taking Centrum only.    2.  DMII:  Three month follow-up; no changes to management made at last visit; HgbA1c of 6.2 in 05/2012. Reports good tolerance of medication; good compliance with medication; good symptom control.  Denies polyuria, polydipsia.  3.  Hyperlipidemia:  Three month follow-up; triglycerides elevated at 401 in 05/2012;  Added Fish Oil after last visit; taking 4000 mg daily 2  1000mg  bid.  Dr. Kirke Corin increased Lipitor from 10mg  to 40mg  at recent visit; repeat FLP by Arida with triglycerides of 208.  Denies headaches, dizziness, focal weakness, paresthesias.    4.  CAD:  Follow-up with Arida in 05/2012; s/p stress test due to persistent fatigue, dyspnea ; lasted less than when had acute onset of jaw pain; only lasted four minutes with recent stress testing; felt anemia likley contributing; recommended exercising with goal heart rate of 120 for 15 minutes daily.  Has started exercises some days; usually exercising four days per week.  Job is physically demanding also.  Follow-up with cardiology in three weeks from now.  Still following closely; follwed every three months.    5.  Anxiety and depression:  Still having low feelings; usually upon awakening.  No triggering event; since CABG; has one per week.  One day per week.  Much better than with onset.  Compliance with medication; good tolerance to medication; good symptom control.  Definitely had worsening mood after CABG.     Review of Systems  Constitutional: Positive for fatigue. Negative for fever,  chills and diaphoresis.  Respiratory: Negative for cough, shortness of breath and wheezing.   Cardiovascular: Negative for chest pain, palpitations and leg swelling.  Gastrointestinal: Negative for nausea, vomiting, abdominal pain, diarrhea, constipation, blood in stool, abdominal distention, anal bleeding and rectal pain.  Neurological: Negative for dizziness, syncope, facial asymmetry, speech difficulty, weakness, light-headedness, numbness and headaches.  Psychiatric/Behavioral: Positive for dysphoric mood. Negative for suicidal ideas and self-injury. The patient is not nervous/anxious.         Past Medical History  Diagnosis Date  . Unstable angina 01/02/2012  . Cardiovascular stress test abnormal 01/02/2012  . Type II diabetes mellitus   . Tobacco abuse   . S/P CABG x 5 01/05/2012    LIMA to LAD, SVG to D2, SVG to OM, sequential SVG to RCA and PDA, EVH via right thigh and leg  . GERD (gastroesophageal reflux disease)   . Hypertension   . Hyperlipidemia   . CAD (coronary artery disease), severe multivessel CAD surgical consult for CABG 01/02/2012    3 vessel CAD with normal EF. s/p CABG x 5: LIMA to LAD, SVG to distal RCA with sequential SVG to PDA, SVG to OM, and SVG to second diagonal  . Unspecified vitamin D deficiency   . Anemia   . Communicable disease   . Cervicalgia   . Jaw pain   . Anxiety state, unspecified   . Impotence of organic origin   . Internal hemorrhoids without mention of complication   .  Personal history of colonic polyps   . Diaphragmatic hernia without mention of obstruction or gangrene     Past Surgical History  Procedure Date  . Rotator cuff curgery 2011    lt rotator cuff  . Cardiac catheterization 4 /19/ 2013    [place]   . Coronary artery bypass graft 01/05/2012    Procedure: CORONARY ARTERY BYPASS GRAFTING (CABG);  Surgeon: Purcell Nails, MD;  Location: [facility] OR;  Service: Open Heart Surgery;  Laterality: N/A;  coronary artery bypass graft  times five using left internal mammary artery and right leg greater saphenous vein harvested endoscopically   . Shoulder surgery 1997    bilateral    Prior to Admission medications   Medication Sig Start Date End Date Taking? Authorizing Provider  aspirin EC 325 MG EC tablet Take 1 tablet (325 mg total) by mouth daily. 01/08/12  Yes Rowe Clack, PA  atorvastatin (LIPITOR) 40 MG tablet Take 1 tablet (40 mg total) by mouth daily. 06/10/12  Yes Iran Ouch, MD  cholecalciferol (VITAMIN D) 1000 UNITS tablet Take 1,000 Units by mouth daily.   Yes Historical Provider, MD  clonazePAM (KLONOPIN) 1 MG tablet Take 1 mg by mouth 2 (two) times daily.   Yes Historical Provider, MD  esomeprazole (NEXIUM) 40 MG capsule Take 40 mg by mouth daily before breakfast.   Yes Historical Provider, MD  metFORMIN (GLUCOPHAGE) 1000 MG tablet Take 1 tablet (1,000 mg total) by mouth 2 (two) times daily with a meal. 05/31/12  Yes Ethelda Chick, MD  metoprolol tartrate (LOPRESSOR) 25 MG tablet Take 1 tablet (25 mg total) by mouth 2 (two) times daily. 03/15/12 03/15/13 Yes Iran Ouch, MD  Multiple Vitamins-Minerals (CENTRUM) tablet Take 1 tablet by mouth daily. 01/08/12 01/07/13 Yes Wayne E Gold, PA  Omega-3 Fatty Acids (FISH OIL) 1000 MG CAPS Take by mouth 2 (two) times daily.   Yes Historical Provider, MD  venlafaxine XR (EFFEXOR-XR) 150 MG 24 hr capsule Take 150 mg by mouth daily.   Yes Historical Provider, MD  vitamin B-12 (CYANOCOBALAMIN) 1000 MCG tablet Take 1,000 mcg by mouth 2 (two) times daily.   Yes Historical Provider, MD    Allergies  Allergen Reactions  . Penicillins Itching    tingling  . Tramadol Rash    History   Social History  . Marital Status: Single    Spouse Name: N/A    Number of Children: 1  . Years of Education: N/A   Occupational History  . [employer]     x 30 yrs   Social History Main Topics  . Smoking status: Former Smoker -- 1.5 packs/day for 35 years    Types:  Cigarettes    Quit date: 11/14/2011  . Smokeless tobacco: Not on file     Comment: quit 2 weeks prior to CABG 12/2011  . Alcohol Use: Yes     Comment: Drinks a 12 pack on Saturday  . Drug Use: No  . Sexually Active: Not on file   Other Topics Concern  . Not on file   Social History Narrative   Lives alone. Sexual History: 20; Last HIV testing 1996 with divorce; has has 20 partners since divorce. One son and a granddaughter 35 mo old. Exercise: Light, yard work; playing with son's puppy. No formal exercise. Works 65 - 70 hrs. Some weeks. Guns in the home not stored in locked cabinet. Smoke alarm in home, Always wears seatbelts.    Family History  Problem Relation Age of Onset  . Heart attack Mother     defribillator in place  . Hypertension Mother   . Hyperlipidemia Mother   . Hypertension Father   . Heart disease Father   . Hyperlipidemia Father   . Cancer Father 46     prostate    implants  . Hypertension Sister     Objective:   Physical Exam  Nursing note and vitals reviewed. Constitutional: He is oriented to person, place, and time. He appears well-developed and well-nourished. No distress.  HENT:  Mouth/Throat: Oropharynx is clear and moist.  Eyes: Conjunctivae normal are normal. Pupils are equal, round, and reactive to light.  Neck: Normal range of motion. Neck supple. No thyromegaly present.  Cardiovascular: Normal rate, regular rhythm, normal heart sounds and intact distal pulses.  Exam reveals no gallop and no friction rub.   No murmur heard. Pulmonary/Chest: Effort normal and breath sounds normal. He has no wheezes. He has no rales.  Abdominal: Soft. Bowel sounds are normal. He exhibits no mass. There is no tenderness. There is no rebound and no guarding.  Lymphadenopathy:    He has no cervical adenopathy.  Neurological: He is alert and oriented to person, place, and time. No cranial nerve deficit. He exhibits normal muscle tone. Coordination normal.  Skin: He is  not diaphoretic.  Psychiatric: He has a normal mood and affect. His behavior is normal. Judgment and thought content normal.       Assessment & Plan:   1. Type II or unspecified type diabetes mellitus without mention of complication, not stated as uncontrolled  CBC with Differential, Hemoglobin A1c  2. Essential hypertension, benign  CK, Comprehensive metabolic panel  3. Pure hypercholesterolemia  Lipid panel  4. Iron deficiency anemia, unspecified  Iron and TIBC, Ferritin  5. Coronary artery disease

## 2012-08-30 NOTE — Assessment & Plan Note (Signed)
Controlled moderately at recent cardiology visit; repeat today.  No change in management.

## 2012-08-30 NOTE — Assessment & Plan Note (Signed)
Improved/resolved.  Repeat labs today; completed three month course of iron supplementation.

## 2012-08-31 ENCOUNTER — Telehealth: Payer: Self-pay

## 2012-08-31 MED ORDER — FERROUS SULFATE 325 (65 FE) MG PO TABS: 325.0000 mg | ORAL_TABLET | Freq: Every day | ORAL | Status: DC

## 2012-09-12 ENCOUNTER — Telehealth: Payer: Self-pay | Admitting: Radiology

## 2012-09-12 NOTE — Telephone Encounter (Signed)
Received fax from [facility] please advise on refill of Clonazepam 1mg  tablet

## 2012-09-16 ENCOUNTER — Telehealth: Payer: Self-pay

## 2012-09-16 MED ORDER — CLONAZEPAM 1 MG PO TABS: 1.0000 mg | ORAL_TABLET | Freq: Three times a day (TID) | ORAL | Status: DC | PRN

## 2012-09-16 NOTE — Telephone Encounter (Signed)
Patient is requesting xanax refill - [facility] in [place]  [phone]

## 2012-09-16 NOTE — Telephone Encounter (Signed)
Called patient, I do not see this med. He states it is Klonopin 1mg , please advise. I have pended this rx.

## 2012-09-16 NOTE — Telephone Encounter (Signed)
Rx approved and printed for faxing. Please advise pt.  KMS

## 2012-09-16 NOTE — Telephone Encounter (Signed)
Rx was sent to pharmacy and Revonda Standard called the pt to notify.

## 2012-09-17 NOTE — Telephone Encounter (Signed)
Refill approved and faxed on 09/16/12.  KMS

## 2012-10-07 ENCOUNTER — Ambulatory Visit: Payer: BC Managed Care – PPO | Admitting: Cardiovascular Disease

## 2012-10-08 ENCOUNTER — Other Ambulatory Visit: Payer: Self-pay | Admitting: Family Medicine

## 2012-10-14 ENCOUNTER — Ambulatory Visit: Payer: Self-pay | Admitting: Cardiovascular Disease

## 2012-10-14 ENCOUNTER — Ambulatory Visit (INDEPENDENT_AMBULATORY_CARE_PROVIDER_SITE_OTHER): Payer: BC Managed Care – PPO | Admitting: Cardiovascular Disease

## 2012-10-14 ENCOUNTER — Encounter: Payer: Self-pay | Admitting: Cardiovascular Disease

## 2012-10-14 VITALS — BP 138/78 | HR 81 | Ht 71.0 in | Wt 183.0 lb

## 2012-10-14 DIAGNOSIS — I2510 Atherosclerotic heart disease of native coronary artery without angina pectoris: Secondary | ICD-10-CM

## 2012-10-14 DIAGNOSIS — E785 Hyperlipidemia, unspecified: Secondary | ICD-10-CM

## 2012-10-14 DIAGNOSIS — I10 Essential (primary) hypertension: Secondary | ICD-10-CM

## 2012-10-14 NOTE — Progress Notes (Signed)
HPI  This is a 52 year old male who is here today for a followup visit regarding coronary artery disease status post CABG x5 in April 2013.  He has chronic medical conditions that include type 2 diabetes, hypertension, hyperlipidemia and previous tobacco use. He had anemia few months after the surgery which resolved completely after treatment with iron supplementation. He had a treadmill stress test done during last visit due to fatigue and dyspnea. There was no convincing evidence of ischemia. He was noted to be significantly deconditioned. I asked him to increase his physical activities. Overall, he has been doing much better. He denies any chest tightness or dyspnea. He gets occasional superficial chest pain at the incision site if he carries heavy objects.  Allergies  Allergen Reactions  . Penicillins Itching    tingling  . Tramadol Rash     Current Outpatient Prescriptions on File Prior to Visit  Medication Sig Dispense Refill  . aspirin EC 325 MG EC tablet Take 1 tablet (325 mg total) by mouth daily.  30 tablet    . atorvastatin (LIPITOR) 40 MG tablet Take 1 tablet (40 mg total) by mouth daily.  30 tablet  11  . cholecalciferol (VITAMIN D) 1000 UNITS tablet Take 1,000 Units by mouth daily.      . clonazePAM (KLONOPIN) 1 MG tablet Take 1 mg by mouth 2 (two) times daily as needed.      Marland Kitchen esomeprazole (NEXIUM) 40 MG capsule Take 40 mg by mouth daily before breakfast.      . ferrous sulfate 325 (65 FE) MG tablet Take 1 tablet (325 mg total) by mouth daily with breakfast.  30 tablet  2  . metFORMIN (GLUCOPHAGE) 1000 MG tablet Take 1 tablet (1,000 mg total) by mouth 2 (two) times daily with a meal.  60 tablet  11  . metoprolol tartrate (LOPRESSOR) 25 MG tablet TAKE 1 TABLET (25 MG TOTAL) BY MOUTH 2 (TWO) TIMES DAILY.  60 tablet  1  . Multiple Vitamins-Minerals (CENTRUM) tablet Take 1 tablet by mouth daily.      . Omega-3 Fatty Acids (FISH OIL) 1000 MG CAPS Take by mouth 2 (two) times  daily.      Marland Kitchen venlafaxine XR (EFFEXOR-XR) 150 MG 24 hr capsule Take 150 mg by mouth daily.      . vitamin B-12 (CYANOCOBALAMIN) 1000 MCG tablet Take 1,000 mcg by mouth daily.          Past Medical History  Diagnosis Date  . Unstable angina 01/02/2012  . Cardiovascular stress test abnormal 01/02/2012  . Type II diabetes mellitus   . Tobacco abuse   . S/P CABG x 5 01/05/2012    LIMA to LAD, SVG to D2, SVG to OM, sequential SVG to RCA and PDA, EVH via right thigh and leg  . GERD (gastroesophageal reflux disease)   . Hypertension   . Hyperlipidemia   . CAD (coronary artery disease), severe multivessel CAD surgical consult for CABG 01/02/2012    3 vessel CAD with normal EF. s/p CABG x 5: LIMA to LAD, SVG to distal RCA with sequential SVG to PDA, SVG to OM, and SVG to second diagonal  . Unspecified vitamin D deficiency   . Anemia   . Communicable disease   . Cervicalgia   . Jaw pain   . Anxiety state, unspecified   . Impotence of organic origin   . Internal hemorrhoids without mention of complication   . Personal history of colonic polyps   .  Diaphragmatic hernia without mention of obstruction or gangrene      Past Surgical History  Procedure Date  . Rotator cuff curgery 2011    lt rotator cuff  . Cardiac catheterization 4 /19/ 2013    [place]   . Coronary artery bypass graft 01/05/2012    Procedure: CORONARY ARTERY BYPASS GRAFTING (CABG);  Surgeon: Purcell Nails, MD;  Location: [facility] OR;  Service: Open Heart Surgery;  Laterality: N/A;  coronary artery bypass graft times five using left internal mammary artery and right leg greater saphenous vein harvested endoscopically   . Shoulder surgery 1997    bilateral     Family History  Problem Relation Age of Onset  . Heart attack Mother     defribillator in place  . Hypertension Mother   . Hyperlipidemia Mother   . Hypertension Father   . Heart disease Father   . Hyperlipidemia Father   . Cancer Father 73     prostate     implants  . Hypertension Sister      History   Social History  . Marital Status: Single    Spouse Name: N/A    Number of Children: 1  . Years of Education: N/A   Occupational History  . [employer]     x 30 yrs   Social History Main Topics  . Smoking status: Former Smoker -- 1.5 packs/day for 35 years    Types: Cigarettes    Quit date: 11/14/2011  . Smokeless tobacco: Not on file     Comment: quit 2 weeks prior to CABG 12/2011  . Alcohol Use: Yes     Comment: Drinks a 12 pack on Saturday  . Drug Use: No  . Sexually Active: Not on file   Other Topics Concern  . Not on file   Social History Narrative   Lives alone. Sexual History: 20; Last HIV testing 1996 with divorce; has has 20 partners since divorce. One son and a granddaughter 30 mo old. Exercise: Light, yard work; playing with son's puppy. No formal exercise. Works 65 - 70 hrs. Some weeks. Guns in the home not stored in locked cabinet. Smoke alarm in home, Always wears seatbelts.     PHYSICAL EXAM   BP 138/78  Pulse 81  Ht 5\' 11"  (1.803 m)  Wt 183 lb (83.008 kg)  BMI 25.52 kg/m2 Constitutional: He is oriented to person, place, and time. He appears well-developed and well-nourished. No distress.  HENT: No nasal discharge.  Head: Normocephalic and atraumatic.  Eyes: Pupils are equal and round. Right eye exhibits no discharge. Left eye exhibits no discharge.  Neck: Normal range of motion. Neck supple. No JVD present. No thyromegaly present.  Cardiovascular: Normal rate, regular rhythm, normal heart sounds and. Exam reveals no gallop and no friction rub. No murmur heard.  Pulmonary/Chest: Effort normal and breath sounds normal. No stridor. No respiratory distress. He has no wheezes. He has no rales. He exhibits no tenderness.  Abdominal: Soft. Bowel sounds are normal. He exhibits no distension. There is no tenderness. There is no rebound and no guarding.  Musculoskeletal: Normal range of motion. He exhibits no  edema and no tenderness.  Neurological: He is alert and oriented to person, place, and time. Coordination normal.  Skin: Skin is warm and dry. No rash noted. He is not diaphoretic. No erythema. No pallor.  Psychiatric: He has a normal mood and affect. His behavior is normal. Judgment and thought content normal.  EKG: Sinus  Rhythm  WITHIN NORMAL LIMITS   ASSESSMENT AND PLAN

## 2012-10-14 NOTE — Assessment & Plan Note (Signed)
Lab Results  Component Value Date   CHOL 128 08/30/2012   HDL 36* 08/30/2012   LDLCALC 49 08/30/2012   TRIG 214* 08/30/2012   CHOLHDL 3.6 08/30/2012   This is significantly better from previous lipid profile which showed a triglyceride about 400. Continue lifelong treatment with a high intensity statin.

## 2012-10-14 NOTE — Patient Instructions (Addendum)
Continue same medications.  Follow up in 6 months.  

## 2012-10-14 NOTE — Assessment & Plan Note (Signed)
He is doing very well at this time with no symptoms suggestive of angina. Continue medical therapy.

## 2012-11-29 ENCOUNTER — Encounter: Payer: Self-pay | Admitting: Family Medicine

## 2012-11-29 ENCOUNTER — Ambulatory Visit (INDEPENDENT_AMBULATORY_CARE_PROVIDER_SITE_OTHER): Payer: BC Managed Care – PPO | Admitting: Family Medicine

## 2012-11-29 VITALS — BP 148/82 | HR 54 | Temp 98.0°F | Resp 16 | Ht 69.75 in | Wt 175.6 lb

## 2012-11-29 DIAGNOSIS — F341 Dysthymic disorder: Secondary | ICD-10-CM

## 2012-11-29 DIAGNOSIS — E785 Hyperlipidemia, unspecified: Secondary | ICD-10-CM

## 2012-11-29 DIAGNOSIS — F418 Other specified anxiety disorders: Secondary | ICD-10-CM

## 2012-11-29 DIAGNOSIS — I2510 Atherosclerotic heart disease of native coronary artery without angina pectoris: Secondary | ICD-10-CM

## 2012-11-29 DIAGNOSIS — E7800 Pure hypercholesterolemia, unspecified: Secondary | ICD-10-CM

## 2012-11-29 DIAGNOSIS — E119 Type 2 diabetes mellitus without complications: Secondary | ICD-10-CM

## 2012-11-29 LAB — CBC WITH DIFFERENTIAL/PLATELET
Hemoglobin: 14 g/dL (ref 13.0–17.0)
Lymphocytes Relative: 23 % (ref 12–46)
Lymphs Abs: 1.9 10*3/uL (ref 0.7–4.0)
Monocytes Relative: 5 % (ref 3–12)
Neutro Abs: 5.2 10*3/uL (ref 1.7–7.7)
Neutrophils Relative %: 63 % (ref 43–77)
Platelets: 331 10*3/uL (ref 150–400)
RBC: 5.21 MIL/uL (ref 4.22–5.81)
WBC: 8.2 10*3/uL (ref 4.0–10.5)

## 2012-11-29 LAB — COMPREHENSIVE METABOLIC PANEL
Alkaline Phosphatase: 92 U/L (ref 39–117)
BUN: 13 mg/dL (ref 6–23)
Creat: 0.79 mg/dL (ref 0.50–1.35)
Glucose, Bld: 125 mg/dL — ABNORMAL HIGH (ref 70–99)
Sodium: 135 mEq/L (ref 135–145)
Total Bilirubin: 0.4 mg/dL (ref 0.3–1.2)

## 2012-11-29 LAB — LIPID PANEL
Cholesterol: 149 mg/dL (ref 0–200)
HDL: 37 mg/dL — ABNORMAL LOW (ref >39)
Triglycerides: 204 mg/dL — ABNORMAL HIGH (ref <150)

## 2012-11-29 LAB — CK: Total CK: 23 U/L (ref 7–232)

## 2012-11-29 NOTE — Assessment & Plan Note (Signed)
Improved at last visit; repeat labs; continue current medications.

## 2012-11-29 NOTE — Assessment & Plan Note (Signed)
Stable/asymptomatic; follow-up with Arida in 09/2012; next appointment in 03/2013.

## 2012-11-29 NOTE — Patient Instructions (Addendum)
Type II or unspecified type diabetes mellitus without mention of complication, not stated as uncontrolled - Plan: Hemoglobin A1c, Comprehensive metabolic panel  Pure hypercholesterolemia - Plan: CBC with Differential, CK, Lipid panel  Depression with anxiety  Coronary artery disease

## 2012-11-29 NOTE — Progress Notes (Signed)
[address]   [place], [place]  [address]   [phone]  Subjective:    Patient ID: Joseph Terry, male    DOB: 1961-08-12, 52 y.o.   MRN: [id number]  HPI This 52 y.o. male presents for three month follow-up and evaluation of the following:  1.  Anxiety and depression: three month follow-up; no changes to management made at last visit.  Mood has really changed over past six months.  Has become really chilled out; no longer has a short temper.   Lack of motivation lately.  No fatigue.  No loss of temper. Less active at work due to bad weather; not sure how much winter is affecting mood.  No excessive sadness.  Has lost spunk and get up and go.  No hypersomnolence.  Non-restorative sleep has developed for past several months.  Going to bed early started before CAD/CABG.  Taking Clonazepam one bid.  In past would take a third pill of Clonazepam periodically; would take third Clonazepam once per week.  Takes Clonazepam at bedtime.  No previous sleep study.  No SI/HI.    2.  Non-restorative sleep:  Onset in past six months; goes to bed 7:00-9:00 pm; wakes up at 6:00am.  Not exercising regularly; plans to start exercise.  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  No issues with insomnia but does not feel that is sleeping well.   3.  DMII: three month follow-up; no changes to management made at last visit.  Reports compliance with medication; good tolerance to medication; good symptom control.  Does not check blood sugars; no low sugars.  Denies polydipsia or polyuria.  4.  Hyperlipidemia:  Three month follow-up; triglycerides improved at last visit with addition of fish oil. Not exercising.  Reports good tolerance to medication; good symptom control; good compliance with medication.  5. CAD: three month follow-up.  S/p recent cardiology follow-up with Arida; no changes to management other than visits spread out to every six months. Denies CP/palp/SOB/leg swelling/neck  or jaw pain.  Not exercising.    Review of Systems  Constitutional: Negative for fever, chills, diaphoresis and fatigue.  Eyes: Negative for visual disturbance.  Respiratory: Negative for cough, shortness of breath and wheezing.   Cardiovascular: Negative for chest pain, palpitations and leg swelling.  Gastrointestinal: Negative for nausea, vomiting, abdominal pain, diarrhea and constipation.  Endocrine: Negative for cold intolerance, heat intolerance, polydipsia, polyphagia and polyuria.  Neurological: Negative for dizziness, seizures, syncope, facial asymmetry, speech difficulty, weakness, light-headedness, numbness and headaches.  Psychiatric/Behavioral: Positive for sleep disturbance. Negative for suicidal ideas, behavioral problems, self-injury, dysphoric mood, decreased concentration and agitation. The patient is not nervous/anxious.         Past Medical History  Diagnosis Date  . Unstable angina 01/02/2012  . Cardiovascular stress test abnormal 01/02/2012  . Type II diabetes mellitus   . Tobacco abuse   . S/P CABG x 5 01/05/2012    LIMA to LAD, SVG to D2, SVG to OM, sequential SVG to RCA and PDA, EVH via right thigh and leg  . GERD (gastroesophageal reflux disease)   . Hypertension   . Hyperlipidemia   . CAD (coronary artery disease), severe multivessel CAD surgical consult for CABG 01/02/2012    3 vessel CAD with normal EF. s/p CABG x 5: LIMA to LAD, SVG to distal RCA with sequential SVG to PDA, SVG to OM, and SVG to second diagonal  . Unspecified vitamin D deficiency   .  Anemia   . Communicable disease   . Cervicalgia   . Jaw pain   . Anxiety state, unspecified   . Impotence of organic origin   . Internal hemorrhoids without mention of complication   . Personal history of colonic polyps   . Diaphragmatic hernia without mention of obstruction or gangrene   . Depression     Past Surgical History  Procedure Laterality Date  . Rotator cuff curgery  2011    lt rotator  cuff  . Cardiac catheterization  4 /19/ 2013    [place]   . Coronary artery bypass graft  01/05/2012    Procedure: CORONARY ARTERY BYPASS GRAFTING (CABG);  Surgeon: Purcell Nails, MD;  Location: [facility] OR;  Service: Open Heart Surgery;  Laterality: N/A;  coronary artery bypass graft times five using left internal mammary artery and right leg greater saphenous vein harvested endoscopically   . Shoulder surgery  1997    bilateral    Prior to Admission medications   Medication Sig Start Date End Date Taking? Authorizing Provider  aspirin EC 325 MG EC tablet Take 1 tablet (325 mg total) by mouth daily. 01/08/12  Yes Wayne E Gold, PA-C  atorvastatin (LIPITOR) 40 MG tablet Take 1 tablet (40 mg total) by mouth daily. 06/10/12  Yes Iran Ouch, MD  cholecalciferol (VITAMIN D) 1000 UNITS tablet Take 1,000 Units by mouth daily.   Yes Historical Provider, MD  clonazePAM (KLONOPIN) 1 MG tablet Take 1 mg by mouth 2 (two) times daily as needed. 09/16/12  Yes Ethelda Chick, MD  esomeprazole (NEXIUM) 40 MG capsule Take 40 mg by mouth daily before breakfast.   Yes Historical Provider, MD  metFORMIN (GLUCOPHAGE) 1000 MG tablet Take 1 tablet (1,000 mg total) by mouth 2 (two) times daily with a meal. 05/31/12  Yes Ethelda Chick, MD  metoprolol tartrate (LOPRESSOR) 25 MG tablet TAKE 1 TABLET (25 MG TOTAL) BY MOUTH 2 (TWO) TIMES DAILY. 10/08/12  Yes Ryan M Dunn, PA-C  Multiple Vitamins-Minerals (CENTRUM) tablet Take 1 tablet by mouth daily. 01/08/12 01/07/13 Yes Wayne E Gold, PA-C  Omega-3 Fatty Acids (FISH OIL) 1000 MG CAPS Take by mouth 2 (two) times daily.   Yes Historical Provider, MD  venlafaxine XR (EFFEXOR-XR) 150 MG 24 hr capsule Take 150 mg by mouth daily.   Yes Historical Provider, MD  vitamin B-12 (CYANOCOBALAMIN) 1000 MCG tablet Take 1,000 mcg by mouth daily.    Yes Historical Provider, MD  ferrous sulfate 325 (65 FE) MG tablet Take 1 tablet (325 mg total) by mouth daily with breakfast. 08/31/12    Ethelda Chick, MD    Allergies  Allergen Reactions  . Penicillins Itching    tingling  . Tramadol Rash    History   Social History  . Marital Status: Single    Spouse Name: N/A    Number of Children: 1  . Years of Education: N/A   Occupational History  . [employer]     x 30 yrs   Social History Main Topics  . Smoking status: Former Smoker -- 1.50 packs/day for 35 years    Types: Cigarettes    Quit date: 11/14/2011  . Smokeless tobacco: Not on file     Comment: quit 2 weeks prior to CABG 12/2011  . Alcohol Use: Yes     Comment: Drinks a 12 pack on Saturday  . Drug Use: No  . Sexually Active: Yes    Birth Control/ Protection: Condom  Other Topics Concern  . Not on file   Social History Narrative   Lives alone. Sexual History: 20; Last HIV testing 1996 with divorce; has has 20 partners since divorce. One son and a granddaughter 77 mo old. Exercise: Light, yard work; playing with son's puppy. No formal exercise. Works 65 - 70 hrs. Some weeks. Guns in the home not stored in locked cabinet. Smoke alarm in home, Always wears seatbelts.    Family History  Problem Relation Age of Onset  . Heart attack Mother     defribillator in place  . Hypertension Mother   . Hyperlipidemia Mother   . Hypertension Father   . Heart disease Father   . Hyperlipidemia Father   . Cancer Father 4     prostate    implants  . Hypertension Sister   . Heart disease Maternal Grandmother   . Heart disease Maternal Grandfather   . Cancer Paternal Grandmother   . Cancer Paternal Grandfather     Objective:   Physical Exam  Nursing note and vitals reviewed. Constitutional: He is oriented to person, place, and time. He appears well-developed and well-nourished. No distress.  HENT:  Head: Normocephalic and atraumatic.  Mouth/Throat: Oropharynx is clear and moist.  Eyes: Conjunctivae are normal. Pupils are equal, round, and reactive to light.  Neck: Normal range of motion. Neck  supple. No JVD present. No thyromegaly present.  Cardiovascular: Normal rate, regular rhythm and normal heart sounds.  Exam reveals no gallop and no friction rub.   No murmur heard. Pulmonary/Chest: Effort normal. He has wheezes in the left middle field.  Abdominal: Soft. Bowel sounds are normal. He exhibits no distension and no mass. There is no tenderness. There is no rebound and no guarding.  Lymphadenopathy:    He has no cervical adenopathy.  Neurological: He is alert and oriented to person, place, and time. No cranial nerve deficit. He exhibits normal muscle tone.  Skin: He is not diaphoretic.  Psychiatric: He has a normal mood and affect. His behavior is normal. Judgment and thought content normal.       Assessment & Plan:  Type II or unspecified type diabetes mellitus without mention of complication, not stated as uncontrolled - Plan: Hemoglobin A1c, Comprehensive metabolic panel  Pure hypercholesterolemia - Plan: CBC with Differential, CK, Lipid panel  Depression with anxiety  Coronary artery disease

## 2012-11-29 NOTE — Assessment & Plan Note (Signed)
Controlled; obtain labs; continue current medications. 

## 2012-11-29 NOTE — Assessment & Plan Note (Signed)
Stable; recent change in mood with lack of motivation but less anger issues and irritability.  Metoprolol may be controlling anxiety symptoms.  Consider decreasing Clonazepam to 1/2 tablet every morning and continue 1 at bedtime.  May not warrant current dose of benzo while taking B blocker.  Pt to try decreasing Clonazepam dose over next three months.  Non-restorative sleep issues new; if persists at next visit, refer for sleep study.

## 2012-12-03 MED ORDER — GLIPIZIDE ER 2.5 MG PO TB24: 2.5000 mg | ORAL_TABLET | Freq: Every day | ORAL | Status: DC

## 2012-12-03 NOTE — Addendum Note (Signed)
Addended by: Ethelda Chick on: 12/03/2012 09:18 AM   Modules accepted: Orders

## 2012-12-15 ENCOUNTER — Other Ambulatory Visit: Payer: Self-pay | Admitting: Physician Assistant

## 2012-12-27 NOTE — Telephone Encounter (Signed)
xxx

## 2013-02-18 ENCOUNTER — Other Ambulatory Visit: Payer: Self-pay | Admitting: Physician Assistant

## 2013-02-21 ENCOUNTER — Other Ambulatory Visit: Payer: Self-pay | Admitting: Physician Assistant

## 2013-02-21 NOTE — Telephone Encounter (Signed)
meds sent

## 2013-02-26 ENCOUNTER — Other Ambulatory Visit: Payer: Self-pay | Admitting: Family Medicine

## 2013-02-28 ENCOUNTER — Encounter: Payer: Self-pay | Admitting: Family Medicine

## 2013-02-28 ENCOUNTER — Telehealth: Payer: Self-pay | Admitting: *Deleted

## 2013-02-28 ENCOUNTER — Ambulatory Visit (INDEPENDENT_AMBULATORY_CARE_PROVIDER_SITE_OTHER): Payer: BC Managed Care – PPO | Admitting: Family Medicine

## 2013-02-28 VITALS — BP 140/80 | HR 84 | Temp 98.4°F | Resp 16 | Ht 70.0 in | Wt 177.4 lb

## 2013-02-28 DIAGNOSIS — R5381 Other malaise: Secondary | ICD-10-CM | POA: Insufficient documentation

## 2013-02-28 DIAGNOSIS — K219 Gastro-esophageal reflux disease without esophagitis: Secondary | ICD-10-CM | POA: Insufficient documentation

## 2013-02-28 DIAGNOSIS — Z0000 Encounter for general adult medical examination without abnormal findings: Secondary | ICD-10-CM

## 2013-02-28 DIAGNOSIS — E119 Type 2 diabetes mellitus without complications: Secondary | ICD-10-CM

## 2013-02-28 DIAGNOSIS — R5383 Other fatigue: Secondary | ICD-10-CM

## 2013-02-28 LAB — POCT URINALYSIS DIPSTICK
Bilirubin, UA: NEGATIVE
Glucose, UA: NEGATIVE
Ketones, UA: NEGATIVE
Leukocytes, UA: NEGATIVE
Nitrite, UA: NEGATIVE

## 2013-02-28 LAB — COMPREHENSIVE METABOLIC PANEL
ALT: 75 U/L — ABNORMAL HIGH (ref 0–53)
AST: 41 U/L — ABNORMAL HIGH (ref 0–37)
Albumin: 4.6 g/dL (ref 3.5–5.2)
BUN: 10 mg/dL (ref 6–23)
CO2: 25 mEq/L (ref 19–32)
Calcium: 9.7 mg/dL (ref 8.4–10.5)
Chloride: 100 mEq/L (ref 96–112)
Creat: 0.77 mg/dL (ref 0.50–1.35)
Potassium: 4.6 mEq/L (ref 3.5–5.3)

## 2013-02-28 LAB — TSH: TSH: 0.978 u[IU]/mL (ref 0.350–4.500)

## 2013-02-28 LAB — CBC WITH DIFFERENTIAL/PLATELET
Eosinophils Absolute: 0.6 10*3/uL (ref 0.0–0.7)
Eosinophils Relative: 9 % — ABNORMAL HIGH (ref 0–5)
HCT: 42.4 % (ref 39.0–52.0)
Hemoglobin: 14.1 g/dL (ref 13.0–17.0)
Lymphocytes Relative: 27 % (ref 12–46)
Lymphs Abs: 1.9 10*3/uL (ref 0.7–4.0)
MCH: 26.9 pg (ref 26.0–34.0)
MCV: 80.8 fL (ref 78.0–100.0)
Monocytes Relative: 4 % (ref 3–12)
RBC: 5.25 MIL/uL (ref 4.22–5.81)
WBC: 7 10*3/uL (ref 4.0–10.5)

## 2013-02-28 LAB — PSA: PSA: 0.37 ng/mL (ref <=4.00)

## 2013-02-28 LAB — HEMOGLOBIN A1C: Hgb A1c MFr Bld: 7.4 % — ABNORMAL HIGH (ref <5.7)

## 2013-02-28 LAB — VITAMIN B12: Vitamin B-12: 1033 pg/mL — ABNORMAL HIGH (ref 211–911)

## 2013-02-28 LAB — LIPID PANEL: HDL: 37 mg/dL — ABNORMAL LOW (ref >39)

## 2013-02-28 LAB — IFOBT (OCCULT BLOOD): IFOBT: NEGATIVE

## 2013-02-28 MED ORDER — METOPROLOL TARTRATE 25 MG PO TABS: 25.0000 mg | ORAL_TABLET | Freq: Two times a day (BID) | ORAL | Status: DC

## 2013-02-28 MED ORDER — VENLAFAXINE HCL ER 150 MG PO CP24: 150.0000 mg | ORAL_CAPSULE | Freq: Every day | ORAL | Status: DC

## 2013-02-28 MED ORDER — PANTOPRAZOLE SODIUM 40 MG PO TBEC: 40.0000 mg | DELAYED_RELEASE_TABLET | Freq: Every day | ORAL | Status: DC

## 2013-02-28 MED ORDER — METFORMIN HCL 1000 MG PO TABS: 1000.0000 mg | ORAL_TABLET | Freq: Two times a day (BID) | ORAL | Status: DC

## 2013-02-28 MED ORDER — CLONAZEPAM 1 MG PO TABS: 1.0000 mg | ORAL_TABLET | Freq: Two times a day (BID) | ORAL | Status: DC | PRN

## 2013-02-28 MED ORDER — ATORVASTATIN CALCIUM 40 MG PO TABS: 40.0000 mg | ORAL_TABLET | Freq: Every day | ORAL | Status: DC

## 2013-02-28 NOTE — Progress Notes (Signed)
[address]   [place], [place]  [address]   [phone]  Subjective:    Patient ID: Joseph Terry, male    DOB: 09/27/1960, 52 y.o.   MRN: [id number]  HPI This 52 y.o. male presents for CPE.  Last physical 01/2012. Colonoscopy 2009.  Repeat in 10 years.  +polyps on last colonoscopy.   TDAP 09/16/2007. Pneumovax 2006. Flu vaccines never. Hepatitis B series. Eye exam 09/2012.  Nice.  No diabetic retinopathy.  No glaucoma or cataracts. Dental exam 08/2012.Marland Kitchen  DMII: added Gluctotrol XL 2.5mg  once daily at last visit.  Usually takes with breakfast.    Fatigue:  Decreased Klonopin to 1/2 tablet every morning and continued Klonopin 1 at qhs.  Excessive sleeping.  Has worsened since last visit.  Becoming scared due to fatigue.  Started paying close attention.  Did not decrease Klonopin to 1/2 tablet every morning.    GERD:  Having terrible heartburn; Nexium not working well at all; thinks that getting up frequently at night is contributing to fatigue/insomnia. Vomiting during the night once per week.   Worsening reflux for six months.  Chronic issue.  Burning in esophagus is every morning.  No melena.  Last endoscopy three years ago [facility].  Nexium costs $73.  No nausea; +constipation; no diarrhea.  No bloody stools.      Review of Systems  Constitutional: Positive for fatigue. Negative for fever, chills, diaphoresis, activity change, appetite change and unexpected weight change.  HENT: Negative for hearing loss, ear pain, nosebleeds, congestion, sore throat, facial swelling, rhinorrhea, sneezing, drooling, mouth sores, trouble swallowing, neck pain, neck stiffness, dental problem, voice change, postnasal drip, sinus pressure, tinnitus and ear discharge.   Eyes: Negative for photophobia, pain, discharge, redness, itching and visual disturbance.  Respiratory: Negative for apnea, cough, choking, chest tightness, shortness of breath, wheezing and stridor.   Cardiovascular: Negative for chest  pain, palpitations and leg swelling.  Gastrointestinal: Positive for vomiting, abdominal pain and constipation. Negative for nausea, diarrhea, blood in stool, abdominal distention, anal bleeding and rectal pain.  Endocrine: Negative for cold intolerance, heat intolerance, polydipsia, polyphagia and polyuria.  Genitourinary: Negative for dysuria, urgency, frequency, hematuria, flank pain, decreased urine volume, discharge, penile swelling, scrotal swelling, enuresis, difficulty urinating, genital sores, penile pain and testicular pain.  Musculoskeletal: Negative for myalgias, back pain, joint swelling, arthralgias and gait problem.  Skin: Negative for color change, pallor, rash and wound.  Allergic/Immunologic: Negative for environmental allergies, food allergies and immunocompromised state.  Neurological: Negative for dizziness, tremors, seizures, syncope, facial asymmetry, speech difficulty, weakness, light-headedness, numbness and headaches.  Hematological: Negative for adenopathy. Does not bruise/bleed easily.  Psychiatric/Behavioral: Positive for sleep disturbance. Negative for suicidal ideas, hallucinations, behavioral problems, confusion, self-injury, dysphoric mood, decreased concentration and agitation. The patient is not nervous/anxious and is not hyperactive.     Past Medical History  Diagnosis Date  . Unstable angina 01/02/2012  . Cardiovascular stress test abnormal 01/02/2012  . Type II diabetes mellitus   . Tobacco abuse   . S/P CABG x 5 01/05/2012    LIMA to LAD, SVG to D2, SVG to OM, sequential SVG to RCA and PDA, EVH via right thigh and leg  . GERD (gastroesophageal reflux disease)   . Hypertension   . Hyperlipidemia   . CAD (coronary artery disease), severe multivessel CAD surgical consult for CABG 01/02/2012    3 vessel CAD with normal EF. s/p CABG x 5: LIMA to LAD, SVG to distal RCA with sequential SVG to  PDA, SVG to OM, and SVG to second diagonal  . Unspecified vitamin D  deficiency   . Anemia   . Communicable disease   . Cervicalgia   . Jaw pain   . Anxiety state, unspecified   . Impotence of organic origin   . Internal hemorrhoids without mention of complication   . Personal history of colonic polyps   . Diaphragmatic hernia without mention of obstruction or gangrene   . Depression     Past Surgical History  Procedure Laterality Date  . Rotator cuff curgery  2011    lt rotator cuff  . Cardiac catheterization  4 /19/ 2013    [place]   . Coronary artery bypass graft  01/05/2012    Procedure: CORONARY ARTERY BYPASS GRAFTING (CABG);  Surgeon: Purcell Nails, MD;  Location: [facility] OR;  Service: Open Heart Surgery;  Laterality: N/A;  coronary artery bypass graft times five using left internal mammary artery and right leg greater saphenous vein harvested endoscopically   . Shoulder surgery  1997    bilateral  . Colonoscopy w/ polypectomy  09/16/2007    colon polyps.  Repeat 5 years. [facility].    Prior to Admission medications   Medication Sig Start Date End Date Taking? Authorizing Provider  aspirin EC 325 MG EC tablet Take 1 tablet (325 mg total) by mouth daily. 01/08/12  Yes Wayne E Gold, PA-C  atorvastatin (LIPITOR) 40 MG tablet Take 1 tablet (40 mg total) by mouth daily. 06/10/12  Yes Iran Ouch, MD  cholecalciferol (VITAMIN D) 1000 UNITS tablet Take 1,000 Units by mouth daily.   Yes Historical Provider, MD  clonazePAM (KLONOPIN) 1 MG tablet Take 1 mg by mouth 2 (two) times daily as needed. 09/16/12  Yes Ethelda Chick, MD  glipiZIDE (GLUCOTROL XL) 2.5 MG 24 hr tablet Take 1 tablet (2.5 mg total) by mouth daily. Take with largest meal of the day. 12/03/12  Yes Ethelda Chick, MD  metFORMIN (GLUCOPHAGE) 1000 MG tablet Take 1 tablet (1,000 mg total) by mouth 2 (two) times daily with a meal. 05/31/12  Yes Ethelda Chick, MD  metoprolol tartrate (LOPRESSOR) 25 MG tablet TAKE 1 TABLET TWICE DAILY 02/18/13  Yes Ethelda Chick, MD  NEXIUM 40 MG capsule TAKE  1 CAPSULE EVERY DAY 02/26/13  Yes Ryan M Dunn, PA-C  Omega-3 Fatty Acids (FISH OIL) 1000 MG CAPS Take by mouth 2 (two) times daily.   Yes Historical Provider, MD  venlafaxine XR (EFFEXOR-XR) 150 MG 24 hr capsule Take 150 mg by mouth daily.   Yes Historical Provider, MD  metoprolol tartrate (LOPRESSOR) 25 MG tablet TAKE 1 TABLET TWICE DAILY 02/21/13   Raymon Mutton Dunn, PA-C  pantoprazole (PROTONIX) 40 MG tablet Take 1 tablet (40 mg total) by mouth daily. 02/28/13   Ethelda Chick, MD  vitamin B-12 (CYANOCOBALAMIN) 1000 MCG tablet Take 1,000 mcg by mouth daily.     Historical Provider, MD    Allergies  Allergen Reactions  . Penicillins Itching    tingling  . Tramadol Rash    History   Social History  . Marital Status: Single    Spouse Name: N/A    Number of Children: 1  . Years of Education: N/A   Occupational History  . [employer]     x 30 yrs   Social History Main Topics  . Smoking status: Former Smoker -- 1.50 packs/day for 35 years    Types: Cigarettes    Quit  date: 11/14/2011  . Smokeless tobacco: Not on file     Comment: quit 2 weeks prior to CABG 12/2011  . Alcohol Use: 0.6 oz/week    1 Cans of beer per week     Comment: Drinks once per month; beer.  . Drug Use: No  . Sexually Active: Yes    Birth Control/ Protection: Condom   Other Topics Concern  . Not on file   Social History Narrative   Marital status: divorced since 1996; dating casually in 2014.      Lives: Lives alone.          Children:  One son and a granddaughter 28 mo old live in [place].       Exercise: Light, yard work; playing with son's puppy formal        Employment:  Works 65 - 70 hrs. Some weeks.       Guns:  Guns in the home not stored in locked cabinet. Smoke alarm in home, Always wears seatbelts.       Sexual History: 18; Last HIV testing 1996 with divorce; has has 20 partners since divorce.              Family History  Problem Relation Age of Onset  . Heart attack Mother      defribillator in place  . Hypertension Mother   . Hyperlipidemia Mother   . Heart disease Mother     CHF with defibrillator;  CAD.  . Diabetes Mother   . Hypertension Father   . Hyperlipidemia Father   . Cancer Father 20     prostate    implants  . COPD Father   . Hypertension Sister   . Heart disease Maternal Grandmother   . Heart disease Maternal Grandfather   . Cancer Paternal Grandmother   . Cancer Paternal Grandfather   . Hypertension Sister        Objective:   Physical Exam  Nursing note and vitals reviewed. Constitutional: He is oriented to person, place, and time. He appears well-developed and well-nourished. No distress.  HENT:  Head: Normocephalic and atraumatic.  Right Ear: External ear normal.  Left Ear: External ear normal.  Nose: Nose normal.  Mouth/Throat: Oropharynx is clear and moist.  Eyes: Conjunctivae and EOM are normal. Pupils are equal, round, and reactive to light.  Neck: Normal range of motion. Neck supple. No thyromegaly present.  Cardiovascular: Normal rate, regular rhythm, normal heart sounds and intact distal pulses.  Exam reveals no gallop and no friction rub.   No murmur heard. Pulmonary/Chest: Effort normal and breath sounds normal. He has no wheezes. He has no rales.  Abdominal: Soft. Bowel sounds are normal. There is no tenderness. There is no rebound and no guarding. Hernia confirmed negative in the right inguinal area and confirmed negative in the left inguinal area.  Genitourinary: Rectum normal, testes normal and penis normal. Prostate is enlarged. Right testis shows no mass, no swelling and no tenderness. Left testis shows no mass, no swelling and no tenderness.  Musculoskeletal: Normal range of motion.  Lymphadenopathy:    He has no cervical adenopathy.       Right: No inguinal adenopathy present.       Left: No inguinal adenopathy present.  Neurological: He is alert and oriented to person, place, and time. He has normal reflexes.    Monofilament intact B.  Skin: Skin is warm and dry. No rash noted. He is not diaphoretic. No erythema.  Psychiatric: He has  a normal mood and affect. His behavior is normal. Judgment and thought content normal.    Results for orders placed in visit on 02/28/13  POCT URINALYSIS DIPSTICK      Result Value Range   Color, UA yellow     Clarity, UA clear     Glucose, UA neg     Bilirubin, UA neg     Ketones, UA neg     Spec Grav, UA 1.015     Blood, UA neg     pH, UA 7.5     Protein, UA neg     Urobilinogen, UA 0.2     Nitrite, UA neg     Leukocytes, UA Negative    IFOBT (OCCULT BLOOD)      Result Value Range   IFOBT Negative         Assessment & Plan:  Routine general medical examination at a health care facility - Plan: CBC with Differential, CK, Comprehensive metabolic panel, Hemoglobin A1c, Lipid panel, POCT urinalysis dipstick, EKG 12-Lead, Microalbumin, urine, TSH, PSA, Vitamin B12, Vitamin D 25 hydroxy, Iron, Folate, IFOBT POC (occult bld, rslt in office), CANCELED: IFOBT POC (occult bld, rslt in office)  Type II or unspecified type diabetes mellitus without mention of complication, not stated as uncontrolled - Plan: metFORMIN (GLUCOPHAGE) 1000 MG tablet  GERD (gastroesophageal reflux disease)  Other malaise and fatigue   Meds ordered this encounter  Medications  . pantoprazole (PROTONIX) 40 MG tablet    Sig: Take 1 tablet (40 mg total) by mouth daily.    Dispense:  30 tablet    Refill:  11  . venlafaxine XR (EFFEXOR-XR) 150 MG 24 hr capsule    Sig: Take 1 capsule (150 mg total) by mouth daily.    Dispense:  30 capsule    Refill:  11  . metoprolol tartrate (LOPRESSOR) 25 MG tablet    Sig: Take 1 tablet (25 mg total) by mouth 2 (two) times daily.    Dispense:  60 tablet    Refill:  11  . clonazePAM (KLONOPIN) 1 MG tablet    Sig: Take 1 tablet (1 mg total) by mouth 2 (two) times daily as needed.    Dispense:  60 tablet    Refill:  5  . metFORMIN (GLUCOPHAGE)  1000 MG tablet    Sig: Take 1 tablet (1,000 mg total) by mouth 2 (two) times daily with a meal.    Dispense:  60 tablet    Refill:  11  . atorvastatin (LIPITOR) 40 MG tablet    Sig: Take 1 tablet (40 mg total) by mouth daily.    Dispense:  30 tablet    Refill:  11

## 2013-02-28 NOTE — Assessment & Plan Note (Signed)
Worsening; switch Nexium to Protonix 40mg  daily per patient request.  If still having GERD issues, add Ranitidine 150mg  qhs.

## 2013-02-28 NOTE — Assessment & Plan Note (Signed)
Uncontrolled; tolerating Glucotrol XL 2.5mg  daily.  Obtain labs.  S/p Monofilament exam in office.

## 2013-02-28 NOTE — Telephone Encounter (Signed)
CALLED PRESCRIPTION CLONAZEAM 1 MG TO [facility] IN [place]  [phone]) @ 2:14 PM TO VOICE-MAIL.

## 2013-02-28 NOTE — Assessment & Plan Note (Signed)
Worsening.  Onset 2013. Pt feels due to insomnia from uncontrolled GERD; treat GERD and reassess.  If persists, consider sleep study. Obtain labs.

## 2013-02-28 NOTE — Progress Notes (Signed)
  Subjective:    Patient ID: Joseph Terry, male    DOB: May 15, 1961, 52 y.o.   MRN: [id number]  HPI    Review of Systems  Constitutional: Positive for fatigue.  HENT: Negative.   Eyes: Negative.   Respiratory: Negative.   Cardiovascular: Negative.   Gastrointestinal: Positive for abdominal pain.  Endocrine: Negative.   Genitourinary: Negative.   Musculoskeletal: Negative.   Allergic/Immunologic: Negative.   Neurological: Negative.   Hematological: Negative.   Psychiatric/Behavioral: Positive for decreased concentration.       Objective:   Physical Exam        Assessment & Plan:

## 2013-02-28 NOTE — Assessment & Plan Note (Signed)
Anticipatory guidance --- exercise, weight maintenance.  Colonoscopy UTD.  Immunizations UTD except for Hepatitis B and influenza.  Obtain labs.

## 2013-03-01 LAB — MICROALBUMIN, URINE: Microalb, Ur: 0.67 mg/dL (ref 0.00–1.89)

## 2013-03-01 MED ORDER — GLIPIZIDE ER 5 MG PO TB24: 5.0000 mg | ORAL_TABLET | Freq: Every day | ORAL | Status: DC

## 2013-03-01 NOTE — Addendum Note (Signed)
Addended by: Ethelda Chick on: 03/01/2013 05:17 PM   Modules accepted: Orders

## 2013-04-14 ENCOUNTER — Ambulatory Visit (INDEPENDENT_AMBULATORY_CARE_PROVIDER_SITE_OTHER): Payer: BC Managed Care – PPO | Admitting: Cardiovascular Disease

## 2013-04-14 ENCOUNTER — Encounter: Payer: Self-pay | Admitting: Cardiovascular Disease

## 2013-04-14 VITALS — BP 134/82 | HR 66 | Ht 70.0 in | Wt 182.8 lb

## 2013-04-14 DIAGNOSIS — I10 Essential (primary) hypertension: Secondary | ICD-10-CM

## 2013-04-14 DIAGNOSIS — E785 Hyperlipidemia, unspecified: Secondary | ICD-10-CM

## 2013-04-14 DIAGNOSIS — I2510 Atherosclerotic heart disease of native coronary artery without angina pectoris: Secondary | ICD-10-CM

## 2013-04-14 NOTE — Assessment & Plan Note (Signed)
Blood pressure is well controlled on current medications. 

## 2013-04-14 NOTE — Progress Notes (Signed)
HPI  This is a 52 year old male who is here today for a followup visit regarding coronary artery disease status post CABG x5 in April 2013.  He has chronic medical conditions that include type 2 diabetes, hypertension, hyperlipidemia and previous tobacco use. He had anemia few months after the surgery which resolved completely after treatment with iron supplementation. He had a treadmill stress test done in October of last year due to fatigue and dyspnea. There was no convincing evidence of ischemia with borderline EKG changes. He was noted to be significantly deconditioned. He has been doing well and denies any chest pain, dyspnea or palpitations. 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   Allergies  Allergen Reactions  . Penicillins Itching    tingling  . Tramadol Rash     Current Outpatient Prescriptions on File Prior to Visit  Medication Sig Dispense Refill  . aspirin EC 325 MG EC tablet Take 1 tablet (325 mg total) by mouth daily.  30 tablet    . atorvastatin (LIPITOR) 40 MG tablet Take 1 tablet (40 mg total) by mouth daily.  30 tablet  11  . clonazePAM (KLONOPIN) 1 MG tablet Take 1 tablet (1 mg total) by mouth 2 (two) times daily as needed.  60 tablet  5  . glipiZIDE (GLUCOTROL XL) 5 MG 24 hr tablet Take 1 tablet (5 mg total) by mouth daily. Take with largest meal of the day.  30 tablet  5  . metFORMIN (GLUCOPHAGE) 1000 MG tablet Take 1 tablet (1,000 mg total) by mouth 2 (two) times daily with a meal.  60 tablet  11  . metoprolol tartrate (LOPRESSOR) 25 MG tablet Take 1 tablet (25 mg total) by mouth 2 (two) times daily.  60 tablet  11  . Omega-3 Fatty Acids (FISH OIL) 1000 MG CAPS Take by mouth 2 (two) times daily.      . pantoprazole (PROTONIX) 40 MG tablet Take 1 tablet (40 mg total) by mouth daily.  30 tablet  11  . venlafaxine XR (EFFEXOR-XR) 150 MG 24 hr capsule Take 1 capsule (150 mg total) by mouth daily.  30 capsule  11    No current facility-administered medications on file prior to visit.     Past Medical History  Diagnosis Date  . Unstable angina 01/02/2012  . Cardiovascular stress test abnormal 01/02/2012  . Type II diabetes mellitus   . Tobacco abuse   . S/P CABG x 5 01/05/2012    LIMA to LAD, SVG to D2, SVG to OM, sequential SVG to RCA and PDA, EVH via right thigh and leg  . GERD (gastroesophageal reflux disease)   . Hypertension   . Hyperlipidemia   . CAD (coronary artery disease), severe multivessel CAD surgical consult for CABG 01/02/2012    3 vessel CAD with normal EF. s/p CABG x 5: LIMA to LAD, SVG to distal RCA with sequential SVG to PDA, SVG to OM, and SVG to second diagonal  . Unspecified vitamin D deficiency   . Anemia   . Communicable disease   . Cervicalgia   . Jaw pain   . Anxiety state, unspecified   . Impotence of organic origin   . Internal hemorrhoids without mention of complication   . Personal history of colonic polyps   . Diaphragmatic hernia without mention of obstruction or gangrene   . Depression      Past Surgical History  Procedure Laterality Date  .  Rotator cuff curgery  2011    lt rotator cuff  . Cardiac catheterization  4 /19/ 2013    [place]   . Coronary artery bypass graft  01/05/2012    Procedure: CORONARY ARTERY BYPASS GRAFTING (CABG);  Surgeon: Purcell Nails, MD;  Location: [facility] OR;  Service: Open Heart Surgery;  Laterality: N/A;  coronary artery bypass graft times five using left internal mammary artery and right leg greater saphenous vein harvested endoscopically   . Shoulder surgery  1997    bilateral  . Colonoscopy w/ polypectomy  09/16/2007    colon polyps.  Repeat 5 years. [facility].     Family History  Problem Relation Age of Onset  . Heart attack Mother     defribillator in place  . Hypertension Mother   . Hyperlipidemia Mother   . Heart disease Mother     CHF with defibrillator;  CAD.  . Diabetes Mother   . Hypertension Father   .  Hyperlipidemia Father   . Cancer Father 11     prostate    implants  . COPD Father   . Hypertension Sister   . Heart disease Maternal Grandmother   . Heart disease Maternal Grandfather   . Cancer Paternal Grandmother   . Cancer Paternal Grandfather   . Hypertension Sister      History   Social History  . Marital Status: Single    Spouse Name: N/A    Number of Children: 1  . Years of Education: N/A   Occupational History  . [employer]     x 30 yrs   Social History Main Topics  . Smoking status: Former Smoker -- 1.50 packs/day for 35 years    Types: Cigarettes    Quit date: 11/14/2011  . Smokeless tobacco: Not on file     Comment: quit 2 weeks prior to CABG 12/2011  . Alcohol Use: 0.6 oz/week    1 Cans of beer per week     Comment: Drinks once per month; beer.  . Drug Use: No  . Sexually Active: Yes    Birth Control/ Protection: Condom   Other Topics Concern  . Not on file   Social History Narrative   Marital status: divorced since 1996; dating casually in 2014.      Lives: Lives alone.          Children:  One son and a granddaughter 16 mo old live in [place].       Exercise: Light, yard work; playing with son's puppy formal        Employment:  Works 65 - 70 hrs. Some weeks.       Guns:  Guns in the home not stored in locked cabinet. Smoke alarm in home, Always wears seatbelts.       Sexual History: 20; Last HIV testing 1996 with divorce; has has 20 partners since divorce.               PHYSICAL EXAM   BP 134/82  Pulse 66  Ht 5\' 10"  (1.778 m)  Wt 182 lb 12 oz (82.895 kg)  BMI 26.22 kg/m2 Constitutional: He is oriented to person, place, and time. He appears well-developed and well-nourished. No distress.  HENT: No nasal discharge.  Head: Normocephalic and atraumatic.  Eyes: Pupils are equal and round. Right eye exhibits no discharge. Left eye exhibits no discharge.  Neck: Normal range of motion. Neck supple. No JVD present. No thyromegaly  present.  Cardiovascular: Normal rate,  regular rhythm, normal heart sounds and. Exam reveals no gallop and no friction rub. No murmur heard.  Pulmonary/Chest: Effort normal and breath sounds normal. No stridor. No respiratory distress. He has no wheezes. He has no rales. He exhibits no tenderness.  Abdominal: Soft. Bowel sounds are normal. He exhibits no distension. There is no tenderness. There is no rebound and no guarding.  Musculoskeletal: Normal range of motion. He exhibits no edema and no tenderness.  Neurological: He is alert and oriented to person, place, and time. Coordination normal.  Skin: Skin is warm and dry. No rash noted. He is not diaphoretic. No erythema. No pallor.  Psychiatric: He has a normal mood and affect. His behavior is normal. Judgment and thought content normal.       EKG: Sinus  Rhythm  WITHIN NORMAL LIMITS   ASSESSMENT AND PLAN

## 2013-04-14 NOTE — Patient Instructions (Addendum)
Your physician has requested that you have an exercise tolerance test. For further information please visit [email or web address] Please also follow instruction sheet, as given.  Do not take Metoprolol the morning of stress test.   Follow up in 6 months.

## 2013-04-14 NOTE — Assessment & Plan Note (Signed)
He is overall doing very well from a cardiac standpoint with no symptoms suggestive of angina, heart failure or arrhythmia. I will schedule him for a treadmill stress test as requested by the DOT.

## 2013-04-14 NOTE — Assessment & Plan Note (Signed)
Lab Results  Component Value Date   CHOL 138 02/28/2013   HDL 37* 02/28/2013   LDLCALC 43 02/28/2013   TRIG 288* 02/28/2013   CHOLHDL 3.7 02/28/2013   Continue treatment with atorvastatin. He needs to cut down on carbohydrates. We can consider fish oil to bring down his triglyceride.

## 2013-04-18 ENCOUNTER — Ambulatory Visit (INDEPENDENT_AMBULATORY_CARE_PROVIDER_SITE_OTHER): Payer: BC Managed Care – PPO | Admitting: Physician Assistant

## 2013-04-18 ENCOUNTER — Encounter: Payer: Self-pay | Admitting: Physician Assistant

## 2013-04-18 DIAGNOSIS — I2510 Atherosclerotic heart disease of native coronary artery without angina pectoris: Secondary | ICD-10-CM

## 2013-04-18 NOTE — Procedures (Signed)
   Treadmill Stress test  Indication: DOT clearance  Baseline Data:  Resting EKG shows NSR with rate of 106 bpm, borderline RBBB, no ST changes Resting blood pressure of 152/80 mm Hg Stand bruce protocal was used.  Exercise Data:  Patient exercised for 4 min 25 sec,  Peak heart rate of 153 bpm.  This was 92% of the maximum predicted heart rate. No symptoms of chest pain or lightheadedness were reported at peak stress or in recovery.  Peak Blood pressure recorded was 200/98 Maximal work level: 7.0 METs.  Heart rate at 3 minutes in recovery was 120 bpm. BP response: appropriate increase HR response: appropriate increase EKG with Exercise: NSR->sinus tachycardia, diffuse upsloping ST changes without significant depressions  FINAL IMPRESSION: Normal exercise stress test. No significant EKG changes concerning for ischemia. Average exercise tolerance.  Recommendation: The patient is an overall low risk for cardiovascular complications.

## 2013-04-18 NOTE — Patient Instructions (Addendum)
Dr. Kirke Corin will interpret your stress test results, and we will contact you with this information.

## 2013-04-18 NOTE — Progress Notes (Deleted)
Patient ID: Joseph Terry, male   DOB: [birth year], 52 y.o.   MRN: [id number] Treadmill Stress test  Indication: DOT clearance  Baseline Data:  Resting EKG shows NSR with rate of 106 bpm, borderline RBBB, no ST changes Resting blood pressure of 152/80 mm Hg Stand bruce protocal was used.  Exercise Data:  Patient exercised for 4 min 25 sec,  Peak heart rate of 153 bpm.  This was 92% of the maximum predicted heart rate. No symptoms of chest pain or lightheadedness were reported at peak stress or in recovery.  Peak Blood pressure recorded was 200/98 Maximal work level: 7.0 METs.  Heart rate at 3 minutes in recovery was 120 bpm. BP response: appropriate increase HR response: appropriate increase EKG with Exercise: NSR->sinus tachycardia, diffuse upsloping ST depressions  FINAL IMPRESSION: Normal exercise stress test. No significant EKG changes concerning for ischemia. *** exercise tolerance.  Recommendation: ***

## 2013-04-20 ENCOUNTER — Telehealth: Payer: Self-pay | Admitting: *Deleted

## 2013-04-20 NOTE — Telephone Encounter (Signed)
Pt aware of stress test results. He is taking Metoprolol 25mg  bid He does need DOT letter for clearance. Mylo Red RN

## 2013-04-20 NOTE — Telephone Encounter (Signed)
Pt would like to pick up letter on 04/21/13 Mylo Red RN

## 2013-04-21 ENCOUNTER — Encounter: Payer: Self-pay | Admitting: *Deleted

## 2013-05-21 ENCOUNTER — Other Ambulatory Visit: Payer: Self-pay | Admitting: Physician Assistant

## 2013-06-13 ENCOUNTER — Ambulatory Visit (INDEPENDENT_AMBULATORY_CARE_PROVIDER_SITE_OTHER): Payer: BC Managed Care – PPO | Admitting: Family Medicine

## 2013-06-13 ENCOUNTER — Encounter: Payer: Self-pay | Admitting: Family Medicine

## 2013-06-13 VITALS — BP 120/80 | HR 73 | Temp 98.2°F | Resp 16 | Ht 70.0 in | Wt 178.4 lb

## 2013-06-13 DIAGNOSIS — E7800 Pure hypercholesterolemia, unspecified: Secondary | ICD-10-CM

## 2013-06-13 DIAGNOSIS — H60542 Acute eczematoid otitis externa, left ear: Secondary | ICD-10-CM

## 2013-06-13 DIAGNOSIS — H60509 Unspecified acute noninfective otitis externa, unspecified ear: Secondary | ICD-10-CM

## 2013-06-13 DIAGNOSIS — F418 Other specified anxiety disorders: Secondary | ICD-10-CM

## 2013-06-13 DIAGNOSIS — K219 Gastro-esophageal reflux disease without esophagitis: Secondary | ICD-10-CM

## 2013-06-13 DIAGNOSIS — F329 Major depressive disorder, single episode, unspecified: Secondary | ICD-10-CM

## 2013-06-13 DIAGNOSIS — E119 Type 2 diabetes mellitus without complications: Secondary | ICD-10-CM

## 2013-06-13 DIAGNOSIS — F3289 Other specified depressive episodes: Secondary | ICD-10-CM

## 2013-06-13 DIAGNOSIS — R5381 Other malaise: Secondary | ICD-10-CM

## 2013-06-13 LAB — COMPREHENSIVE METABOLIC PANEL
ALT: 64 U/L — ABNORMAL HIGH (ref 0–53)
AST: 37 U/L (ref 0–37)
Alkaline Phosphatase: 90 U/L (ref 39–117)
Creat: 0.77 mg/dL (ref 0.50–1.35)
Sodium: 137 mEq/L (ref 135–145)
Total Bilirubin: 0.4 mg/dL (ref 0.3–1.2)
Total Protein: 7.3 g/dL (ref 6.0–8.3)

## 2013-06-13 LAB — CBC
MCH: 26.5 pg (ref 26.0–34.0)
MCV: 80 fL (ref 78.0–100.0)
Platelets: 340 10*3/uL (ref 150–400)
RDW: 15.2 % (ref 11.5–15.5)

## 2013-06-13 LAB — LIPID PANEL
Cholesterol: 154 mg/dL (ref 0–200)
HDL: 35 mg/dL — ABNORMAL LOW (ref >39)
Total CHOL/HDL Ratio: 4.4 Ratio
Triglycerides: 379 mg/dL — ABNORMAL HIGH (ref <150)
VLDL: 76 mg/dL — ABNORMAL HIGH (ref 0–40)

## 2013-06-13 LAB — CK: Total CK: 30 U/L (ref 7–232)

## 2013-06-13 MED ORDER — NEOMYCIN-POLYMYXIN-HC 1 % OT SOLN: 3.0000 [drp] | Freq: Three times a day (TID) | OTIC | Status: DC

## 2013-06-13 MED ORDER — HYDROCORTISONE 2.5 % EX OINT: TOPICAL_OINTMENT | Freq: Two times a day (BID) | CUTANEOUS | Status: DC

## 2013-06-13 NOTE — Progress Notes (Signed)
Subjective:    Patient ID: Joseph Terry, male    DOB: [birth year], 52 y.o.   MRN: [id number]  HPI Good 3 months. Work "alright."   Chest pain once in awhile at CABG incision site. Saw cardiologist recently, good stress test. Seeing cardiologist every 6 months. 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  Problem with "fluid in left ear, like a runny nose." Symptoms for 1 month. Feels sore at the top. Has used neosporin on sore and instilling peroxide. Hearing "decent, not good." Bad every other day. Uses cotton swabs. Hurts with moving head sometimes. No congestion, no runny nose.  No problems with increased Glipizide dose 3 months ago.  Reflux improved. Changed back to Nexium from generic. Not working night shift. Thinks this is helping with symptoms.  No regular exercise.  Review of Systems No SOB, no cough. Anxiety "backed off."   Bowels ok, no black stools, no bloody stools, no nausea, no vomiting. Not super refreshed in morning. Waking up in middle of the night frequently, usually around 3 am. Going to bed between 7-8, gets up at 6. Snores at night. Never has had sleep study. Declines flu shot    Past Medical History  Diagnosis Date  . Unstable angina 01/02/2012  . Cardiovascular stress test abnormal 01/02/2012  . Type II diabetes mellitus   . Tobacco abuse   . S/P CABG x 5 01/05/2012    LIMA to LAD, SVG to D2, SVG to OM, sequential SVG to RCA and PDA, EVH via right thigh and leg  . GERD (gastroesophageal reflux disease)   . Hypertension   . Hyperlipidemia   . CAD (coronary artery disease), severe multivessel CAD surgical consult for CABG 01/02/2012    3 vessel CAD with normal EF. s/p CABG x 5: LIMA to LAD, SVG to distal RCA with sequential SVG to PDA, SVG to OM, and SVG to second diagonal  . Unspecified vitamin D deficiency   . Anemia   . Communicable disease   . Cervicalgia   . Jaw pain   . Anxiety state, unspecified   . Impotence of organic origin   . Internal hemorrhoids without  mention of complication   . Personal history of colonic polyps   . Diaphragmatic hernia without mention of obstruction or gangrene   . Depression   . OSA on CPAP 07/2013    Sleep study 07/2013 +sever OSA; CPAP at 10cm recommended.  . Anemia   . Kidney stone    Past Surgical History  Procedure Laterality Date  . Rotator cuff curgery  2011    lt rotator cuff  . Cardiac catheterization  4 /19/ 2013    [place]   . Coronary artery bypass graft  01/05/2012    Procedure: CORONARY ARTERY BYPASS GRAFTING (CABG);  Surgeon: Purcell Nails, MD;  Location: [facility] OR;  Service: Open Heart Surgery;  Laterality: N/A;  coronary artery bypass graft times five using left internal mammary artery and right leg greater saphenous vein harvested endoscopically   . Shoulder surgery  1997    bilateral  . Colonoscopy w/ polypectomy  09/16/2007    colon polyps.  Repeat 5 years. Iftikhar.  . Polysomnography  07/2013    +severe OSA; CPAP at 10cm pressure recommended.  . Cholecystectomy  11/03/13  . Colonoscopy  11/01/13   Allergies  Allergen Reactions  . Penicillins Itching    tingling  . Tramadol Rash   Current Outpatient Prescriptions on File Prior to Visit  Medication Sig Dispense Refill  .  metFORMIN (GLUCOPHAGE) 1000 MG tablet Take 1 tablet (1,000 mg total) by mouth 2 (two) times daily with a meal.  60 tablet  11  . metoprolol tartrate (LOPRESSOR) 25 MG tablet Take 1 tablet (25 mg total) by mouth 2 (two) times daily.  60 tablet  11  . venlafaxine XR (EFFEXOR-XR) 150 MG 24 hr capsule Take 1 capsule (150 mg total) by mouth daily.  30 capsule  11  . atorvastatin (LIPITOR) 40 MG tablet Take 1 tablet (40 mg total) by mouth daily.  30 tablet  11   No current facility-administered medications on file prior to visit.   History   Social History  . Marital Status: Divorced    Spouse Name: N/A    Number of Children: 1  . Years of Education: N/A   Occupational History  . [employer]     x 30 yrs    Social History Main Topics  . Smoking status: Former Smoker -- 1.50 packs/day for 35 years    Types: Cigarettes    Quit date: 11/14/2011  . Smokeless tobacco: Not on file     Comment: quit 2 weeks prior to CABG 12/2011  . Alcohol Use: 0.6 oz/week    1 Cans of beer per week     Comment: Drinks once per month; beer.  . Drug Use: No  . Sexual Activity: Yes    Birth Control/ Protection: Condom   Other Topics Concern  . Not on file   Social History Narrative   Marital status: divorced since 1996; dating casually in 2014.      Lives: Lives alone.          Children:  One son and a granddaughter 61 mo old live in [place].       Exercise: Light, yard work; playing with son's puppy formal        Employment:  Works 65 - 70 hrs. Some weeks.       Guns:  Guns in the home not stored in locked cabinet. Smoke alarm in home, Always wears seatbelts.       Sexual History: 20; Last HIV testing 1996 with divorce; has has 20 partners since divorce.              Objective:   Physical Exam  Constitutional: He is oriented to person, place, and time. He appears well-developed and well-nourished. No distress.  HENT:  Head: Normocephalic and atraumatic.  Right Ear: External ear normal.  Left Ear: External ear normal.  Nose: Nose normal.  Mouth/Throat: Oropharynx is clear and moist.  Eyes: Conjunctivae and EOM are normal. Pupils are equal, round, and reactive to light.  Neck: Normal range of motion. Neck supple. Carotid bruit is not present. No thyromegaly present.  Cardiovascular: Normal rate, regular rhythm, normal heart sounds and intact distal pulses.  Exam reveals no gallop and no friction rub.   No murmur heard. Pulmonary/Chest: Effort normal and breath sounds normal. He has no wheezes. He has no rales.  Abdominal: Soft. Bowel sounds are normal. He exhibits no distension and no mass. There is no tenderness. There is no rebound and no guarding.  Lymphadenopathy:    He has no cervical  adenopathy.  Neurological: He is alert and oriented to person, place, and time. He has normal reflexes.  Skin: Skin is warm and dry. No rash noted. He is not diaphoretic.  Psychiatric: He has a normal mood and affect. His behavior is normal.   Left ear with flaky canal.  Assessment & Plan:  Type II or unspecified type diabetes mellitus without mention of complication, not stated as uncontrolled - Plan: HM Diabetes Foot Exam, Hemoglobin A1c  Pure hypercholesterolemia - Plan: CBC, Comprehensive metabolic panel, CK, Lipid panel  Depression with anxiety  GERD (gastroesophageal reflux disease)  Other malaise and fatigue - Plan: Ambulatory referral to Sleep Studies  Otitis externa, acute eczematoid, left   1.  DMII: uncontrolled; tolerating Glipizide at current dose.  Obtain labs. Refused flu vaccine. 2.  Hyperlipidemia: moderately controlled; LDL<70 is goal; obtain labs. 3.  Depression with anxiety: stable; no change in therapy. 4.  GERD: improved with switching to Nexium. 5.  Malaise and fatigue: recurrent; obtain labs; refer for sleep study. 6.  Otitis Externa L eczematoid:  New. rx provided.  Meds ordered this encounter  Medications  . esomeprazole (NEXIUM) 40 MG capsule    Sig: Take 40 mg by mouth daily before breakfast.  . DISCONTD: NEOMYCIN-POLYMYXIN-HYDROCORTISONE (CORTISPORIN) 1 % SOLN otic solution    Sig: Place 3 drops into the left ear every 8 (eight) hours.    Dispense:  10 mL    Refill:  0  . hydrocortisone 2.5 % ointment    Sig: Apply topically 2 (two) times daily.    Dispense:  20 g    Refill:  0   Nilda Simmer, M.D.  [facility] [address] [place], [place]  [address] [phone] phone [phone] fax

## 2013-06-19 ENCOUNTER — Other Ambulatory Visit: Payer: Self-pay | Admitting: Physician Assistant

## 2013-06-23 ENCOUNTER — Encounter: Payer: Self-pay | Admitting: Family Medicine

## 2013-07-16 HISTORY — PX: POLYSOMNOGRAPHY: SHX453

## 2013-07-21 ENCOUNTER — Other Ambulatory Visit: Payer: Self-pay

## 2013-07-22 ENCOUNTER — Ambulatory Visit: Payer: Self-pay | Admitting: Family Medicine

## 2013-08-05 ENCOUNTER — Telehealth: Payer: Self-pay

## 2013-08-05 NOTE — Telephone Encounter (Signed)
Dr. Katrinka Blazing  Patient is anxious to hear about the results of his sleep study done two weeks ago.    Please call [phone]

## 2013-08-10 ENCOUNTER — Encounter: Payer: Self-pay | Admitting: Family Medicine

## 2013-08-10 ENCOUNTER — Telehealth: Payer: Self-pay | Admitting: Radiology

## 2013-08-10 NOTE — Telephone Encounter (Signed)
Sent my chart message regarding the patients sleep apnea/ need for CPAP, sent original copy for scanning.

## 2013-08-17 NOTE — Telephone Encounter (Signed)
My chart message was sent. Called him to advise. He did get a copy of the sleep study yesterday and he did get a call from the facility to get him set up for the CPAP

## 2013-09-10 ENCOUNTER — Other Ambulatory Visit: Payer: Self-pay | Admitting: Family Medicine

## 2013-09-11 ENCOUNTER — Other Ambulatory Visit: Payer: Self-pay | Admitting: Family Medicine

## 2013-09-12 ENCOUNTER — Other Ambulatory Visit: Payer: Self-pay | Admitting: Family Medicine

## 2013-09-12 ENCOUNTER — Ambulatory Visit: Payer: BC Managed Care – PPO | Admitting: Family Medicine

## 2013-09-16 NOTE — Telephone Encounter (Signed)
Called in.

## 2013-09-20 ENCOUNTER — Ambulatory Visit (INDEPENDENT_AMBULATORY_CARE_PROVIDER_SITE_OTHER): Payer: BC Managed Care – PPO | Admitting: Family Medicine

## 2013-09-20 ENCOUNTER — Encounter: Payer: Self-pay | Admitting: Family Medicine

## 2013-09-20 ENCOUNTER — Other Ambulatory Visit: Payer: Self-pay | Admitting: Family Medicine

## 2013-09-20 VITALS — BP 128/77 | HR 68 | Temp 98.3°F | Resp 16 | Ht 69.5 in | Wt 184.0 lb

## 2013-09-20 DIAGNOSIS — I2510 Atherosclerotic heart disease of native coronary artery without angina pectoris: Secondary | ICD-10-CM

## 2013-09-20 DIAGNOSIS — I10 Essential (primary) hypertension: Secondary | ICD-10-CM

## 2013-09-20 DIAGNOSIS — Z9989 Dependence on other enabling machines and devices: Secondary | ICD-10-CM

## 2013-09-20 DIAGNOSIS — R945 Abnormal results of liver function studies: Secondary | ICD-10-CM

## 2013-09-20 DIAGNOSIS — E785 Hyperlipidemia, unspecified: Secondary | ICD-10-CM

## 2013-09-20 DIAGNOSIS — R7989 Other specified abnormal findings of blood chemistry: Secondary | ICD-10-CM

## 2013-09-20 DIAGNOSIS — F418 Other specified anxiety disorders: Secondary | ICD-10-CM

## 2013-09-20 DIAGNOSIS — R5381 Other malaise: Secondary | ICD-10-CM

## 2013-09-20 DIAGNOSIS — E782 Mixed hyperlipidemia: Secondary | ICD-10-CM

## 2013-09-20 DIAGNOSIS — G4733 Obstructive sleep apnea (adult) (pediatric): Secondary | ICD-10-CM

## 2013-09-20 DIAGNOSIS — E119 Type 2 diabetes mellitus without complications: Secondary | ICD-10-CM

## 2013-09-20 DIAGNOSIS — R5383 Other fatigue: Secondary | ICD-10-CM

## 2013-09-20 LAB — HEMOGLOBIN A1C
Hgb A1c MFr Bld: 8.9 % — ABNORMAL HIGH (ref <5.7)
Mean Plasma Glucose: 209 mg/dL — ABNORMAL HIGH (ref <117)

## 2013-09-20 LAB — CBC WITH DIFFERENTIAL/PLATELET
Basophils Absolute: 0.1 10*3/uL (ref 0.0–0.1)
Basophils Relative: 1 % (ref 0–1)
EOS ABS: 0.4 10*3/uL (ref 0.0–0.7)
Eosinophils Relative: 10 % — ABNORMAL HIGH (ref 0–5)
HCT: 37.4 % — ABNORMAL LOW (ref 39.0–52.0)
Hemoglobin: 12.4 g/dL — ABNORMAL LOW (ref 13.0–17.0)
Lymphocytes Relative: 28 % (ref 12–46)
Lymphs Abs: 1.3 10*3/uL (ref 0.7–4.0)
MCH: 27 pg (ref 26.0–34.0)
MCHC: 33.2 g/dL (ref 30.0–36.0)
MCV: 81.3 fL (ref 78.0–100.0)
Monocytes Absolute: 0.4 10*3/uL (ref 0.1–1.0)
Monocytes Relative: 9 % (ref 3–12)
Neutro Abs: 2.3 10*3/uL (ref 1.7–7.7)
Neutrophils Relative %: 52 % (ref 43–77)
Platelets: 285 10*3/uL (ref 150–400)
RBC: 4.6 MIL/uL (ref 4.22–5.81)
RDW: 14.7 % (ref 11.5–15.5)
WBC: 4.5 10*3/uL (ref 4.0–10.5)

## 2013-09-20 LAB — LIPID PANEL
Cholesterol: 130 mg/dL (ref 0–200)
HDL: 23 mg/dL — ABNORMAL LOW (ref >39)
LDL CALC: 53 mg/dL (ref 0–99)
Total CHOL/HDL Ratio: 5.7 Ratio
Triglycerides: 270 mg/dL — ABNORMAL HIGH (ref <150)
VLDL: 54 mg/dL — ABNORMAL HIGH (ref 0–40)

## 2013-09-20 LAB — COMPLETE METABOLIC PANEL WITH GFR
ALBUMIN: 3.8 g/dL (ref 3.5–5.2)
ALT: 214 U/L — ABNORMAL HIGH (ref 0–53)
AST: 52 U/L — ABNORMAL HIGH (ref 0–37)
Alkaline Phosphatase: 152 U/L — ABNORMAL HIGH (ref 39–117)
BILIRUBIN TOTAL: 0.5 mg/dL (ref 0.3–1.2)
BUN: 10 mg/dL (ref 6–23)
CALCIUM: 8.7 mg/dL (ref 8.4–10.5)
CO2: 24 mEq/L (ref 19–32)
Chloride: 100 mEq/L (ref 96–112)
Creat: 0.72 mg/dL (ref 0.50–1.35)
GLUCOSE: 335 mg/dL — AB (ref 70–99)
POTASSIUM: 4.2 meq/L (ref 3.5–5.3)
Sodium: 136 mEq/L (ref 135–145)
TOTAL PROTEIN: 6.5 g/dL (ref 6.0–8.3)

## 2013-09-20 NOTE — Progress Notes (Signed)
Subjective:    Patient ID: Joseph Terry, male    DOB: 30-Apr-1961, 53 y.o.   MRN: [id number]  HPI This 53 y.o. male presents for three month follow-up:  1.  DMII:  Last HgbA1c of 8.0.  No changes to management made at last visit.  Reports good compliance with medication; good tolerance to medication; good symptom control. Denies paresthesias of feet or arms; denies polydipsia.  Does not check sugars; declines flu vaccine yearly.  2.  Hyperlipidemia:  Three month follow-up; no changes to management made at last visit; triglycerides remain in the 300s.  Reports good compliance with Lipitor; not taking Fish Oil but plans to restart.    3.  Fatigue: s/p sleep study 07/2013; +OSA and CPAP at 10cm pressure initiated. Cannot tell a difference; no longer waking up with headaches but fatigue not improved.  Has spoken with respiratory therapist; started 08/23/13; has full face mask.  Wearing mask most of the night yet mask is uncomfortable.  Having reaction to mask.  No improved energy during the day.  Less fatigue during the day.  Neck pain is also improved.    4.  Anxiety:    Mother passed in 06/2013 of lung cancer after 2.5 week hospitalization at [facility].  Pt and family not happy with care by hospitalist group.  Coping well with stressors. Feels anxiety and depression well controlled; taking Klonopin bid every morning and qhs.  Has had difficulties sleeping for the past three mornings.  No SI.    5.  CAD:  Three month follow-up; will be due for cardiology follow-up next month.  Denies exertional chest pain, palpitations, SOB, leg swelling, orthopnea.  Not exercising.  6. GI bug: did have a 12 hour GI bug last week with abdominal pain and vomiting; now resolved; mild nausea persistent for 2-3 days.  No abdominal pain at this time; ran fever with vomiting.   Review of Systems  Constitutional: Positive for fatigue. Negative for fever, chills, diaphoresis and unexpected weight change.  Respiratory:  Positive for apnea. Negative for cough, shortness of breath, wheezing and stridor.   Cardiovascular: Negative for chest pain, palpitations and leg swelling.  Gastrointestinal: Negative for nausea, vomiting, abdominal pain and diarrhea.  Endocrine: Negative for cold intolerance, heat intolerance, polydipsia, polyphagia and polyuria.  Neurological: Negative for dizziness, tremors, seizures, syncope, facial asymmetry, speech difficulty, weakness, light-headedness, numbness and headaches.  Psychiatric/Behavioral: Positive for sleep disturbance and dysphoric mood. Negative for suicidal ideas and self-injury. The patient is nervous/anxious.        Past Medical History  Diagnosis Date  . Unstable angina 01/02/2012  . Cardiovascular stress test abnormal 01/02/2012  . Type II diabetes mellitus   . Tobacco abuse   . S/P CABG x 5 01/05/2012    LIMA to LAD, SVG to D2, SVG to OM, sequential SVG to RCA and PDA, EVH via right thigh and leg  . GERD (gastroesophageal reflux disease)   . Hypertension   . Hyperlipidemia   . CAD (coronary artery disease), severe multivessel CAD surgical consult for CABG 01/02/2012    3 vessel CAD with normal EF. s/p CABG x 5: LIMA to LAD, SVG to distal RCA with sequential SVG to PDA, SVG to OM, and SVG to second diagonal  . Unspecified vitamin D deficiency   . Anemia   . Communicable disease   . Cervicalgia   . Jaw pain   . Anxiety state, unspecified   . Impotence of organic origin   . Internal hemorrhoids  without mention of complication   . Personal history of colonic polyps   . Diaphragmatic hernia without mention of obstruction or gangrene   . Depression    Past Surgical History  Procedure Laterality Date  . Rotator cuff curgery  2011    lt rotator cuff  . Cardiac catheterization  4 /19/ 2013    [place]   . Coronary artery bypass graft  01/05/2012    Procedure: CORONARY ARTERY BYPASS GRAFTING (CABG);  Surgeon: Rexene [place], MD;  Location: [facility];  Service:  Open Heart Surgery;  Laterality: N/A;  coronary artery bypass graft times five using left internal mammary artery and right leg greater saphenous vein harvested endoscopically   . Shoulder surgery  1997    bilateral  . Colonoscopy w/ polypectomy  09/16/2007    colon polyps.  Repeat 5 years. [facility].   Allergies  Allergen Reactions  . Penicillins Itching    tingling  . Tramadol Rash   Current Outpatient Prescriptions on File Prior to Visit  Medication Sig Dispense Refill  . aspirin EC 325 MG EC tablet Take 1 tablet (325 mg total) by mouth daily.  30 tablet    . atorvastatin (LIPITOR) 40 MG tablet Take 1 tablet (40 mg total) by mouth daily.  30 tablet  11  . clonazePAM (KLONOPIN) 1 MG tablet TAKE 1 TABLET BY MOUTH TWICE A DAY AS NEEDED  60 tablet  3  . esomeprazole (NEXIUM) 40 MG capsule Take 40 mg by mouth daily before breakfast.      . glipiZIDE (GLUCOTROL XL) 5 MG 24 hr tablet TAKE 1 TABLET BY MOUTH DAILY. TAKE WITH LARGEST MEAL OF THE DAY.  30 tablet  5  . hydrocortisone 2.5 % ointment Apply topically 2 (two) times daily.  20 g  0  . metFORMIN (GLUCOPHAGE) 1000 MG tablet Take 1 tablet (1,000 mg total) by mouth 2 (two) times daily with a meal.  60 tablet  11  . metoprolol tartrate (LOPRESSOR) 25 MG tablet Take 1 tablet (25 mg total) by mouth 2 (two) times daily.  60 tablet  11  . NEXIUM 40 MG capsule TAKE ONE CAPSULE BY MOUTH EVERY DAY  30 capsule  5  . venlafaxine XR (EFFEXOR-XR) 150 MG 24 hr capsule Take 1 capsule (150 mg total) by mouth daily.  30 capsule  11   No current facility-administered medications on file prior to visit.    Objective:   Physical Exam  Nursing note and vitals reviewed. Constitutional: He is oriented to person, place, and time. He appears well-developed and well-nourished. No distress.  HENT:  Head: Normocephalic and atraumatic.  Right Ear: External ear normal.  Left Ear: External ear normal.  Nose: Nose normal.  Mouth/Throat: Oropharynx is clear and  moist.  Eyes: Conjunctivae and EOM are normal. Pupils are equal, round, and reactive to light.  Neck: Normal range of motion. Neck supple. No JVD present. Carotid bruit is not present. No thyromegaly present.  Cardiovascular: Normal rate, regular rhythm, normal heart sounds and intact distal pulses.  Exam reveals no gallop and no friction rub.   No murmur heard. Pulmonary/Chest: Effort normal and breath sounds normal. He has no wheezes. He has no rales.  Abdominal: Soft. Bowel sounds are normal. He exhibits no distension. There is no tenderness. There is no rebound and no guarding.  Lymphadenopathy:    He has no cervical adenopathy.  Neurological: He is alert and oriented to person, place, and time.  Skin: Skin is  warm and dry. No rash noted. He is not diaphoretic.  +calluses B feet.  Psychiatric: He has a normal mood and affect. His behavior is normal. Judgment and thought content normal.       Assessment & Plan:  Type II or unspecified type diabetes mellitus without mention of complication, not stated as uncontrolled - Plan: Hemoglobin A1c  Mixed hyperlipidemia - Plan: CBC with Differential, COMPLETE METABOLIC PANEL WITH GFR, Hemoglobin A1c, Lipid panel  OSA on CPAP  Coronary artery disease  Other malaise and fatigue  Dyslipidemia  Hypertension  Depression with anxiety

## 2013-09-20 NOTE — Assessment & Plan Note (Signed)
Controlled; followed by cardiology every six months; asymptomatic on current medication regimen.

## 2013-09-20 NOTE — Assessment & Plan Note (Signed)
Moderately controlled with Lipitor therapy; to restart fish oil; obtain labs.  Triglycerides have worsened with elevation in glucose levels.  Recommend dietary modification.

## 2013-09-20 NOTE — Assessment & Plan Note (Signed)
New.  Tolerating CPAP moderately well; compliance with nighttime CPAP usage. Has been using CPAP just under one month; follow-up in three months.

## 2013-09-20 NOTE — Assessment & Plan Note (Signed)
Controlled; no change in management; obtain labs.

## 2013-09-20 NOTE — Assessment & Plan Note (Signed)
Moderately controlled; grieving the death of mother in 07/06/2013; coping well.  Counseling provided during visit.  No change in therapy.

## 2013-09-20 NOTE — Assessment & Plan Note (Signed)
Uncontrolled; obtain labs; continue current medications; refused flu vaccine.

## 2013-09-20 NOTE — Assessment & Plan Note (Signed)
Improved over past month with CPAP usage; monitor closely.

## 2013-09-25 ENCOUNTER — Encounter: Payer: Self-pay | Admitting: Family Medicine

## 2013-09-25 NOTE — Addendum Note (Signed)
Addended by: [name] on: 09/25/2013 09:59 AM   Modules accepted: Orders

## 2013-09-26 ENCOUNTER — Encounter: Payer: Self-pay | Admitting: Family Medicine

## 2013-09-26 LAB — IRON AND TIBC
%SAT: 16 % — AB (ref 20–55)
Iron: 53 ug/dL (ref 42–165)
TIBC: 331 ug/dL (ref 215–435)
UIBC: 278 ug/dL (ref 125–400)

## 2013-09-26 LAB — VITAMIN B12: Vitamin B-12: 670 pg/mL (ref 211–911)

## 2013-09-28 ENCOUNTER — Ambulatory Visit
Admission: RE | Admit: 2013-09-28 | Discharge: 2013-09-28 | Disposition: A | Discharge status: Home / Self Care | Payer: BC Managed Care – PPO | Source: Ambulatory Visit | Attending: Family Medicine | Admitting: Family Medicine

## 2013-09-28 DIAGNOSIS — R945 Abnormal results of liver function studies: Principal | ICD-10-CM

## 2013-09-28 DIAGNOSIS — R7989 Other specified abnormal findings of blood chemistry: Secondary | ICD-10-CM

## 2013-09-29 ENCOUNTER — Encounter: Payer: Self-pay | Admitting: Family Medicine

## 2013-10-03 ENCOUNTER — Ambulatory Visit (INDEPENDENT_AMBULATORY_CARE_PROVIDER_SITE_OTHER): Payer: BC Managed Care – PPO | Admitting: Family Medicine

## 2013-10-03 ENCOUNTER — Encounter: Payer: Self-pay | Admitting: Family Medicine

## 2013-10-03 VITALS — BP 130/60 | HR 102 | Temp 97.6°F | Resp 16 | Ht 69.75 in | Wt 183.2 lb

## 2013-10-03 DIAGNOSIS — R7989 Other specified abnormal findings of blood chemistry: Secondary | ICD-10-CM

## 2013-10-03 DIAGNOSIS — R945 Abnormal results of liver function studies: Secondary | ICD-10-CM

## 2013-10-03 DIAGNOSIS — D509 Iron deficiency anemia, unspecified: Secondary | ICD-10-CM

## 2013-10-03 DIAGNOSIS — K921 Melena: Secondary | ICD-10-CM

## 2013-10-03 DIAGNOSIS — M546 Pain in thoracic spine: Secondary | ICD-10-CM

## 2013-10-03 DIAGNOSIS — K8020 Calculus of gallbladder without cholecystitis without obstruction: Secondary | ICD-10-CM

## 2013-10-03 LAB — CBC WITH DIFFERENTIAL/PLATELET
BASOS PCT: 1 % (ref 0–1)
Basophils Absolute: 0.1 10*3/uL (ref 0.0–0.1)
Eosinophils Absolute: 0.3 10*3/uL (ref 0.0–0.7)
Eosinophils Relative: 5 % (ref 0–5)
HCT: 39 % (ref 39.0–52.0)
Hemoglobin: 12.9 g/dL — ABNORMAL LOW (ref 13.0–17.0)
LYMPHS ABS: 2 10*3/uL (ref 0.7–4.0)
Lymphocytes Relative: 29 % (ref 12–46)
MCH: 27.4 pg (ref 26.0–34.0)
MCHC: 33.1 g/dL (ref 30.0–36.0)
MCV: 82.8 fL (ref 78.0–100.0)
MONOS PCT: 7 % (ref 3–12)
Monocytes Absolute: 0.5 10*3/uL (ref 0.1–1.0)
NEUTROS ABS: 3.9 10*3/uL (ref 1.7–7.7)
NEUTROS PCT: 58 % (ref 43–77)
Platelets: 397 10*3/uL (ref 150–400)
RBC: 4.71 MIL/uL (ref 4.22–5.81)
RDW: 14.5 % (ref 11.5–15.5)
WBC: 6.7 10*3/uL (ref 4.0–10.5)

## 2013-10-03 LAB — IFOBT (OCCULT BLOOD): IFOBT: POSITIVE

## 2013-10-03 NOTE — Progress Notes (Signed)
Subjective:    Patient ID: Joseph Terry, male    DOB: 10/29/1960, 53 y.o.   MRN: [id number]  HPI This 53 y.o. male presents for two week evaluation of:  1. Anemia iron deficiency: recurrent issue.  Did notice dark stools with recent GI bug; dark stools lasted for two days.  No bloody stools.  No n/v/d since GI illness; +abdominal pain has persisted.  Colonoscopy UTD in 2009.    2.  Abdominal pain:  Has lower abdominal pain after eating.  Having post-prandial pain on R mid abdomen.  Thought pain was reflux related; s/p abdominal u/s which revealed gallstones.  3. Upper back pain:  Onset two weeks ago; pain upon awakening; will wake up patient.  Pain with movement.  Clears up eventually in the morning.  No medications.  No pain with ROM neck.  +Stiffness.  No radiation into arms.  + B Shoulder blade area.  Feels like spasm. Shoulder pain is separate from RUQ pain.  No SOB or diaphoresis or nausea with upper back pain.  No radiation into arms.  Sleeping in weird positions as adjusting to CPAP.    4. Elevated LFTs: found at visit two weeks ago; no regular alcohol; had drinks twice during the holidays; previous alcohol was six months ago.  +abdominal u/s revealed fatty liver and gallstones. Did suffer with recent abdominal pain, n/v.  No recurrence other than persistent RUQ pain and lower abdominal pain.    Review of Systems  Constitutional: Negative for fever, chills, diaphoresis, activity change, appetite change, fatigue and unexpected weight change.  Respiratory: Negative for shortness of breath.   Gastrointestinal: Positive for abdominal pain. Negative for nausea, vomiting, diarrhea, constipation, blood in stool, abdominal distention, anal bleeding and rectal pain.  Neurological: Positive for headaches. Negative for dizziness, weakness, light-headedness and numbness.  Hematological: Negative for adenopathy. Does not bruise/bleed easily.   Past Medical History  Diagnosis Date  . Unstable  angina 01/02/2012  . Cardiovascular stress test abnormal 01/02/2012  . Type II diabetes mellitus   . Tobacco abuse   . S/P CABG x 5 01/05/2012    LIMA to LAD, SVG to D2, SVG to OM, sequential SVG to RCA and PDA, EVH via right thigh and leg  . GERD (gastroesophageal reflux disease)   . Hypertension   . Hyperlipidemia   . CAD (coronary artery disease), severe multivessel CAD surgical consult for CABG 01/02/2012    3 vessel CAD with normal EF. s/p CABG x 5: LIMA to LAD, SVG to distal RCA with sequential SVG to PDA, SVG to OM, and SVG to second diagonal  . Unspecified vitamin D deficiency   . Anemia   . Communicable disease   . Cervicalgia   . Jaw pain   . Anxiety state, unspecified   . Impotence of organic origin   . Internal hemorrhoids without mention of complication   . Personal history of colonic polyps   . Diaphragmatic hernia without mention of obstruction or gangrene   . Depression   . OSA on CPAP 07/2013    Sleep study 07/2013 +sever OSA; CPAP at 10cm recommended.   Past Surgical History  Procedure Laterality Date  . Rotator cuff curgery  2011    lt rotator cuff  . Cardiac catheterization  4 /19/ 2013    [place]   . Coronary artery bypass graft  01/05/2012    Procedure: CORONARY ARTERY BYPASS GRAFTING (CABG);  Surgeon: Rexene Alberts, MD;  Location: [facility];  Service:  Open Heart Surgery;  Laterality: N/A;  coronary artery bypass graft times five using left internal mammary artery and right leg greater saphenous vein harvested endoscopically   . Shoulder surgery  1997    bilateral  . Colonoscopy w/ polypectomy  09/16/2007    colon polyps.  Repeat 5 years. Iftikhar.  . Polysomnography  07/2013    +severe OSA; CPAP at 10cm pressure recommended.   Allergies  Allergen Reactions  . Penicillins Itching    tingling  . Tramadol Rash   Current Outpatient Prescriptions on File Prior to Visit  Medication Sig Dispense Refill  . aspirin EC 325 MG EC tablet Take 1 tablet (325 mg  total) by mouth daily.  30 tablet    . atorvastatin (LIPITOR) 40 MG tablet Take 1 tablet (40 mg total) by mouth daily.  30 tablet  11  . clonazePAM (KLONOPIN) 1 MG tablet TAKE 1 TABLET BY MOUTH TWICE A DAY AS NEEDED  60 tablet  3  . esomeprazole (NEXIUM) 40 MG capsule Take 40 mg by mouth daily before breakfast.      . glipiZIDE (GLUCOTROL XL) 5 MG 24 hr tablet TAKE 1 TABLET BY MOUTH DAILY. TAKE WITH LARGEST MEAL OF THE DAY.  30 tablet  5  . hydrocortisone 2.5 % ointment Apply topically 2 (two) times daily.  20 g  0  . metFORMIN (GLUCOPHAGE) 1000 MG tablet Take 1 tablet (1,000 mg total) by mouth 2 (two) times daily with a meal.  60 tablet  11  . metoprolol tartrate (LOPRESSOR) 25 MG tablet Take 1 tablet (25 mg total) by mouth 2 (two) times daily.  60 tablet  11  . NEXIUM 40 MG capsule TAKE ONE CAPSULE BY MOUTH EVERY DAY  30 capsule  5  . venlafaxine XR (EFFEXOR-XR) 150 MG 24 hr capsule Take 1 capsule (150 mg total) by mouth daily.  30 capsule  11   No current facility-administered medications on file prior to visit.   History   Social History  . Marital Status: Single    Spouse Name: N/A    Number of Children: 1  . Years of Education: N/A   Occupational History  . [employer]     x 30 yrs   Social History Main Topics  . Smoking status: Former Smoker -- 1.50 packs/day for 35 years    Types: Cigarettes    Quit date: 11/14/2011  . Smokeless tobacco: Not on file     Comment: quit 2 weeks prior to CABG 12/2011  . Alcohol Use: 0.6 oz/week    1 Cans of beer per week     Comment: Drinks once per month; beer.  . Drug Use: No  . Sexual Activity: Yes    Birth Control/ Protection: Condom   Other Topics Concern  . Not on file   Social History Narrative   Marital status: divorced since 1996; dating casually in 2014.      Lives: Lives alone.          Children:  One son and a granddaughter 63 mo old live in [place].       Exercise: Light, yard work; playing with son's puppy formal         Employment:  Works 69 - 70 hrs. Some weeks.       Guns:  Guns in the home not stored in locked cabinet. Smoke alarm in home, Always wears seatbelts.       Sexual History: 61; Last HIV testing 1996 with divorce;  has has 20 partners since divorce.                 Objective:   Physical Exam  Nursing note and vitals reviewed. Constitutional: He appears well-developed and well-nourished. No distress.  HENT:  Head: Normocephalic and atraumatic.  Mouth/Throat: Oropharynx is clear and moist.  Eyes: Conjunctivae are normal. Pupils are equal, round, and reactive to light.  Neck: Normal range of motion. Neck supple. No thyromegaly present.  Cardiovascular: Normal rate, regular rhythm and normal heart sounds.   No murmur heard. Pulmonary/Chest: Effort normal and breath sounds normal. He has no wheezes. He has no rales.  Abdominal: Soft. Bowel sounds are normal. He exhibits no distension and no mass. There is no tenderness. There is no rebound and no guarding.  Genitourinary: Rectum normal. Rectal exam shows no mass and no tenderness.  Musculoskeletal:       Right shoulder: Normal.       Left shoulder: Normal.       Cervical back: Normal. He exhibits normal range of motion, no tenderness, no bony tenderness, no pain and no spasm.       Thoracic back: He exhibits normal range of motion, no tenderness, no bony tenderness, no pain and no spasm.  Lymphadenopathy:    He has no cervical adenopathy.  Skin: Skin is warm and dry. No rash noted. He is not diaphoretic.   Results for orders placed in visit on 10/03/13  IFOBT (OCCULT BLOOD)      Result Value Range   IFOBT Positive         Assessment & Plan:  Iron deficiency anemia, unspecified - Plan: CBC with Differential, IFOBT POC (occult bld, rslt in office)  Elevated LFTs - Plan: COMPLETE METABOLIC PANEL WITH GFR  Cholelithiasis - Plan: Ambulatory referral to General Surgery  Thoracic back pain  Blood in stool   1.  Iron  deficiency anemia:  New.  With recent severe abdominal pain, vomiting, hemoccult + stools.  Repeat CBC today; continue daily iron supplement.  Colonoscopy UTD. 2.   Elevated LFTs:  New. Associated with recent severe abdominal pain, n/v; abdominal u/s reveals cholethiasis; repeat LFTs today.  If remains elevated, add hepatitis panel. 3.  Cholelithiasis:  New.  With recent severe abdominal pain,n/v, elevated LFTs. Refer to general surgery for consultation. 4.  Blood in stool: New.  If anemia worsens, will warrant GI evaluation for EGD and colonoscopy. 5.  Thoracic back pain:  New.  Benign exam today; occurs upon awakening; no exertional component; range of motion worsens suggestive of musculoskeletal etiology.  If worsens, RTC.  No medication due to self resolution.

## 2013-10-04 LAB — COMPLETE METABOLIC PANEL WITH GFR
ALBUMIN: 4.3 g/dL (ref 3.5–5.2)
ALK PHOS: 102 U/L (ref 39–117)
ALT: 35 U/L (ref 0–53)
AST: 23 U/L (ref 0–37)
BUN: 9 mg/dL (ref 6–23)
CO2: 24 meq/L (ref 19–32)
Calcium: 9.2 mg/dL (ref 8.4–10.5)
Chloride: 98 mEq/L (ref 96–112)
Creat: 0.7 mg/dL (ref 0.50–1.35)
GFR, Est African American: >89 mL/min
GFR, Est Non African American: >89 mL/min
Glucose, Bld: 250 mg/dL — ABNORMAL HIGH (ref 70–99)
POTASSIUM: 4 meq/L (ref 3.5–5.3)
SODIUM: 136 meq/L (ref 135–145)
TOTAL PROTEIN: 6.6 g/dL (ref 6.0–8.3)
Total Bilirubin: 0.3 mg/dL (ref 0.3–1.2)

## 2013-10-13 ENCOUNTER — Encounter: Payer: Self-pay | Admitting: Family Medicine

## 2013-10-19 ENCOUNTER — Ambulatory Visit (INDEPENDENT_AMBULATORY_CARE_PROVIDER_SITE_OTHER): Payer: BC Managed Care – PPO | Admitting: General Surgery

## 2013-10-19 ENCOUNTER — Encounter: Payer: Self-pay | Admitting: General Surgery

## 2013-10-19 VITALS — BP 140/82 | HR 72 | Resp 12 | Ht 70.0 in | Wt 187.0 lb

## 2013-10-19 DIAGNOSIS — Z860100 Personal history of colon polyps, unspecified: Secondary | ICD-10-CM | POA: Insufficient documentation

## 2013-10-19 DIAGNOSIS — K8020 Calculus of gallbladder without cholecystitis without obstruction: Secondary | ICD-10-CM

## 2013-10-19 MED ORDER — POLYETHYLENE GLYCOL 3350 17 GM/SCOOP PO POWD: ORAL | Status: DC

## 2013-10-19 NOTE — Patient Instructions (Addendum)
Laparoscopic Cholecystectomy Laparoscopic cholecystectomy is surgery to remove the gallbladder. The gallbladder is located in the upper right part of the abdomen, behind the liver. It is a storage sac for bile produced in the liver. Bile aids in the digestion and absorption of fats. Cholecystectomy is often done for inflammation of the gallbladder (cholecystitis). This condition is usually caused by a buildup of gallstones (cholelithiasis) in your gallbladder. Gallstones can block the flow of bile, resulting in inflammation and pain. In severe cases, emergency surgery may be required. When emergency surgery is not required, you will have time to prepare for the procedure. Laparoscopic surgery is an alternative to open surgery. Laparoscopic surgery has a shorter recovery time. Your common bile duct may also need to be examined during the procedure. If stones are found in the common bile duct, they may be removed. LET [facility] CARE PROVIDER KNOW ABOUT:  Any allergies you have.  All medicines you are taking, including vitamins, herbs, eye drops, creams, and over-the-counter medicines.  Previous problems you or members of your family have had with the use of anesthetics.  Any blood disorders you have.  Previous surgeries you have had.  Medical conditions you have. RISKS AND COMPLICATIONS Generally, this is a safe procedure. However, as with any procedure, complications can occur. Possible complications include:  Infection.  Damage to the common bile duct, nerves, arteries, veins, or other internal organs such as the stomach, liver, or intestines.  Bleeding.  A stone may remain in the common bile duct.  A bile leak from the cyst duct that is clipped when your gallbladder is removed.  The need to convert to open surgery, which requires a larger incision in the abdomen. This may be necessary if your surgeon thinks it is not safe to continue with a laparoscopic procedure. BEFORE THE  PROCEDURE  Ask your health care provider about changing or stopping any regular medicines. You will need to stop taking aspirin or blood thinners at least 5 days prior to surgery.  Do not eat or drink anything after midnight the night before surgery.  Let your health care provider know if you develop a cold or other infectious problem before surgery. PROCEDURE   You will be given medicine to make you sleep through the procedure (general anesthetic). A breathing tube will be placed in your mouth.  When you are asleep, your surgeon will make several small cuts (incisions) in your abdomen.  A thin, lighted tube with a tiny camera on the end (laparoscope) is inserted through one of the small incisions. The camera on the laparoscope sends a picture to a TV screen in the operating room. This gives the surgeon a good view inside your abdomen.  A gas will be pumped into your abdomen. This expands your abdomen so that the surgeon has more room to perform the surgery.  Other tools needed for the procedure are inserted through the other incisions. The gallbladder is removed through one of the incisions.  After the removal of your gallbladder, the incisions will be closed with stitches, staples, or skin glue. AFTER THE PROCEDURE  You will be taken to a recovery area where your progress will be checked often.  You may be allowed to go home the same day if your pain is controlled and you can tolerate liquids. Document Released: 09/01/2005 Document Revised: 06/22/2013 Document Reviewed: 04/13/2013 [facility] Patient Information 2014 [place]. Colonoscopy A colonoscopy is an exam to look at the entire large intestine (colon). This exam can  help find problems such as tumors, polyps, inflammation, and areas of bleeding. The exam takes about 1 hour.  LET [facility] CARE PROVIDER KNOW ABOUT:   Any allergies you have.  All medicines you are taking, including vitamins, herbs, eye drops, creams, and  over-the-counter medicines.  Previous problems you or members of your family have had with the use of anesthetics.  Any blood disorders you have.  Previous surgeries you have had.  Medical conditions you have. RISKS AND COMPLICATIONS  Generally, this is a safe procedure. However, as with any procedure, complications can occur. Possible complications include:  Bleeding.  Tearing or rupture of the colon wall.  Reaction to medicines given during the exam.  Infection (rare). BEFORE THE PROCEDURE   Ask your health care provider about changing or stopping your regular medicines.  You may be prescribed an oral bowel prep. This involves drinking a large amount of medicated liquid, starting the day before your procedure. The liquid will cause you to have multiple loose stools until your stool is almost clear or light green. This cleans out your colon in preparation for the procedure.  Do not eat or drink anything else once you have started the bowel prep, unless your health care provider tells you it is safe to do so.  Arrange for someone to drive you home after the procedure. PROCEDURE   You will be given medicine to help you relax (sedative).  You will lie on your side with your knees bent.  A long, flexible tube with a light and camera on the end (colonoscope) will be inserted through the rectum and into the colon. The camera sends video back to a computer screen as it moves through the colon. The colonoscope also releases carbon dioxide gas to inflate the colon. This helps your health care provider see the area better.  During the exam, your health care provider may take a small tissue sample (biopsy) to be examined under a microscope if any abnormalities are found.  The exam is finished when the entire colon has been viewed. AFTER THE PROCEDURE   Do not drive for 24 hours after the exam.  You may have a small amount of blood in your stool.  You may pass moderate amounts of  gas and have mild abdominal cramping or bloating. This is caused by the gas used to inflate your colon during the exam.  Ask when your test results will be ready and how you will get your results. Make sure you get your test results. Document Released: 08/29/2000 Document Revised: 06/22/2013 Document Reviewed: 05/09/2013 [facility] Patient Information 2014 [place].  Patient to see Dr. Fletcher Anon on 10-27-13 for cardiac clearance.   This patient has been scheduled for a colonoscopy on 11-01-13 at [facility]. Patient has been asked to hold metformin and glipizide day of colonoscopy prep and procedure. He has also been asked to decrease 325 mg aspirin to 81 mg aspirin one week prior.  Patient's surgery has been scheduled for 11-08-13 at [facility]. He will remain on 81 mg aspirin after colonoscopy until after gallbladder surgery is completed.

## 2013-10-19 NOTE — Progress Notes (Signed)
Patient ID: Joseph Terry, male   DOB: 29-Dec-1960, 53 y.o.   MRN: [id number]  Chief Complaint  Patient presents with  . Abdominal Pain    cholecystitis    HPI Joseph Terry is a 53 y.o. male.  Here today for evaluation of abdominal pain/cholecystitis referred by Dr Reginia Forts.  Ultrasound done 09-28-13.  States he has had pain for serveral years but it seems to be worse.   He remembers one episode right after Christmas that caused right side abdominal pain with vomiting that started around 1 pm and didn't go away til the evening time. He does admit to abdominal pain/bloating after he eats he feels more pressure.    HPI  Past Medical History  Diagnosis Date  . Unstable angina 01/02/2012  . Cardiovascular stress test abnormal 01/02/2012  . Type II diabetes mellitus   . Tobacco abuse   . S/P CABG x 5 01/05/2012    LIMA to LAD, SVG to D2, SVG to OM, sequential SVG to RCA and PDA, EVH via right thigh and leg  . GERD (gastroesophageal reflux disease)   . Hypertension   . Hyperlipidemia   . CAD (coronary artery disease), severe multivessel CAD surgical consult for CABG 01/02/2012    3 vessel CAD with normal EF. s/p CABG x 5: LIMA to LAD, SVG to distal RCA with sequential SVG to PDA, SVG to OM, and SVG to second diagonal  . Unspecified vitamin D deficiency   . Anemia   . Communicable disease   . Cervicalgia   . Jaw pain   . Anxiety state, unspecified   . Impotence of organic origin   . Internal hemorrhoids without mention of complication   . Personal history of colonic polyps   . Diaphragmatic hernia without mention of obstruction or gangrene   . Depression   . OSA on CPAP 07/2013    Sleep study 07/2013 +sever OSA; CPAP at 10cm recommended.    Past Surgical History  Procedure Laterality Date  . Rotator cuff curgery  2011    lt rotator cuff  . Cardiac catheterization  4 /19/ 2013    [place]   . Coronary artery bypass graft  01/05/2012    Procedure: CORONARY ARTERY BYPASS  GRAFTING (CABG);  Surgeon: Rexene Alberts, MD;  Location: [facility];  Service: Open Heart Surgery;  Laterality: N/A;  coronary artery bypass graft times five using left internal mammary artery and right leg greater saphenous vein harvested endoscopically   . Shoulder surgery  1997    bilateral  . Colonoscopy w/ polypectomy  09/16/2007    colon polyps.  Repeat 5 years. Iftikhar.  . Polysomnography  07/2013    +severe OSA; CPAP at 10cm pressure recommended.    Family History  Problem Relation Age of Onset  . Heart attack Mother     defribillator in place  . Hypertension Mother   . Hyperlipidemia Mother   . Heart disease Mother     CHF with defibrillator;  CAD.  . Diabetes Mother   . Cancer Mother 73    Lung cancer  . Hypertension Father   . Hyperlipidemia Father   . Cancer Father 10     prostate    implants  . COPD Father   . Hypertension Sister   . Heart disease Maternal Grandmother   . Heart disease Maternal Grandfather   . Cancer Paternal Grandmother   . Cancer Paternal Grandfather   . Hypertension Sister     Social History  History  Substance Use Topics  . Smoking status: Former Smoker -- 1.50 packs/day for 35 years    Types: Cigarettes    Quit date: 11/14/2011  . Smokeless tobacco: Not on file     Comment: quit 2 weeks prior to CABG 12/2011  . Alcohol Use: 0.6 oz/week    1 Cans of beer per week     Comment: Drinks once per month; beer.    Allergies  Allergen Reactions  . Penicillins Itching    tingling  . Tramadol Rash    Current Outpatient Prescriptions  Medication Sig Dispense Refill  . aspirin EC 325 MG EC tablet Take 1 tablet (325 mg total) by mouth daily.  30 tablet    . atorvastatin (LIPITOR) 40 MG tablet Take 1 tablet (40 mg total) by mouth daily.  30 tablet  11  . clonazePAM (KLONOPIN) 1 MG tablet TAKE 1 TABLET BY MOUTH TWICE A DAY AS NEEDED  60 tablet  3  . esomeprazole (NEXIUM) 40 MG capsule Take 40 mg by mouth daily before breakfast.      . ferrous  sulfate 325 (65 FE) MG tablet Take 325 mg by mouth daily with breakfast.      . glipiZIDE (GLUCOTROL XL) 5 MG 24 hr tablet TAKE 1 TABLET BY MOUTH DAILY. TAKE WITH LARGEST MEAL OF THE DAY.  30 tablet  5  . hydrocortisone 2.5 % ointment Apply topically 2 (two) times daily.  20 g  0  . metFORMIN (GLUCOPHAGE) 1000 MG tablet Take 1 tablet (1,000 mg total) by mouth 2 (two) times daily with a meal.  60 tablet  11  . metoprolol tartrate (LOPRESSOR) 25 MG tablet Take 1 tablet (25 mg total) by mouth 2 (two) times daily.  60 tablet  11  . Omega-3 Fatty Acids (FISH OIL) 1200 MG CAPS Take by mouth 2 (two) times daily.      Marland Kitchen venlafaxine XR (EFFEXOR-XR) 150 MG 24 hr capsule Take 1 capsule (150 mg total) by mouth daily.  30 capsule  11  . polyethylene glycol powder (GLYCOLAX/MIRALAX) powder 255 grams one bottle for colonoscopy prep  255 g  0   No current facility-administered medications for this visit.    Review of Systems Review of Systems  Gastrointestinal: Positive for vomiting and abdominal pain.    Blood pressure 140/82, pulse 72, resp. rate 12, height 5\' 10"  (1.778 m), weight 187 lb (84.823 kg).  Physical Exam Physical Exam  Constitutional: He is oriented to person, place, and time. He appears well-developed and well-nourished.  Eyes: Conjunctivae are normal. No scleral icterus.  Neck: Neck supple. No thyromegaly present.  Cardiovascular: Normal rate, regular rhythm and normal heart sounds.   Pulmonary/Chest: Effort normal and breath sounds normal.  Abdominal: Soft. Normal appearance and bowel sounds are normal. There is no hepatosplenomegaly. There is no tenderness. There is negative Murphy's sign. No hernia.  Lymphadenopathy:    He has no cervical adenopathy.  Neurological: He is alert and oriented to person, place, and time. He has normal reflexes.  Skin: Skin is warm and dry.    Data Reviewed Ultrasound showing multiple gallstones.  Assessment    Symptomatic cholelithiasis. Patient  has a history of colon polyps. Last colonoscopy was 2009.      Plan    Colonoscopy discussed with patient. Cholecystectomy surgery discussed with patient.  He does need cardiac clearance pre-procedures.      Patient to see Dr. Fletcher Anon on 10-27-13 at 11 am for cardiac clearance.  This patient has been scheduled for a colonoscopy on 11-01-13 at [facility]. Patient has been asked to hold metformin and glipizide day of colonoscopy prep and procedure. He has also been asked to decrease 325 mg aspirin to 81 mg aspirin one week prior.  Patient's surgery has been scheduled for 11-08-13 at [facility]. He will remain on 81 mg aspirin after colonoscopy until after gallbladder surgery is completed.     , G 10/19/2013, 7:04 PM

## 2013-10-20 ENCOUNTER — Telehealth: Payer: Self-pay | Admitting: *Deleted

## 2013-10-20 NOTE — Telephone Encounter (Signed)
Patient called this morning wanting to move gallbladder surgery from 11-08-13 to 11-04-13. This will be moved according to patient's request.

## 2013-10-27 ENCOUNTER — Ambulatory Visit (INDEPENDENT_AMBULATORY_CARE_PROVIDER_SITE_OTHER): Payer: BC Managed Care – PPO | Admitting: Cardiovascular Disease

## 2013-10-27 ENCOUNTER — Encounter: Payer: Self-pay | Admitting: Cardiovascular Disease

## 2013-10-27 ENCOUNTER — Ambulatory Visit: Payer: Self-pay | Admitting: General Surgery

## 2013-10-27 VITALS — BP 159/98 | HR 93 | Ht 70.0 in | Wt 185.5 lb

## 2013-10-27 DIAGNOSIS — I10 Essential (primary) hypertension: Secondary | ICD-10-CM

## 2013-10-27 DIAGNOSIS — Z01818 Encounter for other preprocedural examination: Secondary | ICD-10-CM

## 2013-10-27 DIAGNOSIS — I2510 Atherosclerotic heart disease of native coronary artery without angina pectoris: Secondary | ICD-10-CM

## 2013-10-27 DIAGNOSIS — E785 Hyperlipidemia, unspecified: Secondary | ICD-10-CM

## 2013-10-27 NOTE — Patient Instructions (Signed)
You are at low risk for surgery from a cardiac standpoint.   Continue same medications.   Your physician wants you to follow-up in: 6 months.  You will receive a reminder letter in the mail two months in advance. If you don't receive a letter, please call our office to schedule the follow-up appointment.

## 2013-10-27 NOTE — Progress Notes (Signed)
HPI  This is a 53 year old male who is here today for a followup visit regarding coronary artery disease status post CABG x5 in April 2013.  He has chronic medical conditions that include type 2 diabetes, hypertension, hyperlipidemia and previous tobacco use. He had anemia few months after the surgery which resolved completely after treatment with iron supplementation. He had a treadmill stress test done in August of 2014 which showed no evidence of ischemia. He had recent abdominal pain as well as mild anemia. He needs to have colonoscopy done as well as a cystectomy. He denies chest pain or dyspnea. He was diagnosed with sleep apnea and currently is using CPAP.  Allergies  Allergen Reactions  . Penicillins Itching    tingling  . Tramadol Rash     Current Outpatient Prescriptions on File Prior to Visit  Medication Sig Dispense Refill  . atorvastatin (LIPITOR) 40 MG tablet Take 1 tablet (40 mg total) by mouth daily.  30 tablet  11  . clonazePAM (KLONOPIN) 1 MG tablet TAKE 1 TABLET BY MOUTH TWICE A DAY AS NEEDED  60 tablet  3  . esomeprazole (NEXIUM) 40 MG capsule Take 40 mg by mouth daily before breakfast.      . ferrous sulfate 325 (65 FE) MG tablet Take 325 mg by mouth daily with breakfast.      . glipiZIDE (GLUCOTROL XL) 5 MG 24 hr tablet TAKE 1 TABLET BY MOUTH DAILY. TAKE WITH LARGEST MEAL OF THE DAY.  30 tablet  5  . hydrocortisone 2.5 % ointment Apply topically 2 (two) times daily.  20 g  0  . metFORMIN (GLUCOPHAGE) 1000 MG tablet Take 1 tablet (1,000 mg total) by mouth 2 (two) times daily with a meal.  60 tablet  11  . metoprolol tartrate (LOPRESSOR) 25 MG tablet Take 1 tablet (25 mg total) by mouth 2 (two) times daily.  60 tablet  11  . polyethylene glycol powder (GLYCOLAX/MIRALAX) powder 255 grams one bottle for colonoscopy prep  255 g  0  . venlafaxine XR (EFFEXOR-XR) 150 MG 24 hr capsule Take 1 capsule (150 mg total) by mouth daily.  30 capsule  11  . Omega-3 Fatty Acids  (FISH OIL) 1200 MG CAPS Take by mouth 2 (two) times daily.       No current facility-administered medications on file prior to visit.     Past Medical History  Diagnosis Date  . Unstable angina 01/02/2012  . Cardiovascular stress test abnormal 01/02/2012  . Type II diabetes mellitus   . Tobacco abuse   . S/P CABG x 5 01/05/2012    LIMA to LAD, SVG to D2, SVG to OM, sequential SVG to RCA and PDA, EVH via right thigh and leg  . GERD (gastroesophageal reflux disease)   . Hypertension   . Hyperlipidemia   . CAD (coronary artery disease), severe multivessel CAD surgical consult for CABG 01/02/2012    3 vessel CAD with normal EF. s/p CABG x 5: LIMA to LAD, SVG to distal RCA with sequential SVG to PDA, SVG to OM, and SVG to second diagonal  . Unspecified vitamin D deficiency   . Anemia   . Communicable disease   . Cervicalgia   . Jaw pain   . Anxiety state, unspecified   . Impotence of organic origin   . Internal hemorrhoids without mention of complication   . Personal history of colonic polyps   . Diaphragmatic hernia without mention of obstruction or gangrene   .  Depression   . OSA on CPAP 07/2013    Sleep study 07/2013 +sever OSA; CPAP at 10cm recommended.  . Anemia   . Kidney stone      Past Surgical History  Procedure Laterality Date  . Rotator cuff curgery  2011    lt rotator cuff  . Cardiac catheterization  4 /19/ 2013    [place]   . Coronary artery bypass graft  01/05/2012    Procedure: CORONARY ARTERY BYPASS GRAFTING (CABG);  Surgeon: Rexene Alberts, MD;  Location: [facility];  Service: Open Heart Surgery;  Laterality: N/A;  coronary artery bypass graft times five using left internal mammary artery and right leg greater saphenous vein harvested endoscopically   . Shoulder surgery  1997    bilateral  . Colonoscopy w/ polypectomy  09/16/2007    colon polyps.  Repeat 5 years. I[facility]ikhar.  . Polysomnography  07/2013    +severe OSA; CPAP at 10cm pressure recommended.      Family History  Problem Relation Age of Onset  . Heart attack Mother     defribillator in place  . Hypertension Mother   . Hyperlipidemia Mother   . Heart disease Mother     CHF with defibrillator;  CAD.  . Diabetes Mother   . Cancer Mother 72    Lung cancer  . Hypertension Father   . Hyperlipidemia Father   . Cancer Father 66     prostate    implants  . COPD Father   . Hypertension Sister   . Heart disease Maternal Grandmother   . Heart disease Maternal Grandfather   . Cancer Paternal Grandmother   . Cancer Paternal Grandfather   . Hypertension Sister      History   Social History  . Marital Status: Divorced    Spouse Name: N/A    Number of Children: 1  . Years of Education: N/A   Occupational History  . [employer]     x 30 yrs   Social History Main Topics  . Smoking status: Former Smoker -- 1.50 packs/day for 35 years    Types: Cigarettes    Quit date: 11/14/2011  . Smokeless tobacco: Not on file     Comment: quit 2 weeks prior to CABG 12/2011  . Alcohol Use: 0.6 oz/week    1 Cans of beer per week     Comment: Drinks once per month; beer.  . Drug Use: No  . Sexual Activity: Yes    Birth Control/ Protection: Condom   Other Topics Concern  . Not on file   Social History Narrative   Marital status: divorced since 1996; dating casually in 2014.      Lives: Lives alone.          Children:  One son and a granddaughter 19 mo old live in [place].       Exercise: Light, yard work; playing with son's puppy formal        Employment:  Works 82 - 70 hrs. Some weeks.       Guns:  Guns in the home not stored in locked cabinet. Smoke alarm in home, Always wears seatbelts.       Sexual History: 42; Last HIV testing 1996 with divorce; has has 59 partners since divorce.               PHYSICAL EXAM   BP 159/98  Pulse 93  Ht 5\' 10"  (1.778 m)  Wt 185 lb 8 oz (84.142  kg)  BMI 26.62 kg/m2 Constitutional: He is oriented to person, place, and time.  He appears well-developed and well-nourished. No distress.  HENT: No nasal discharge.  Head: Normocephalic and atraumatic.  Eyes: Pupils are equal and round. Right eye exhibits no discharge. Left eye exhibits no discharge.  Neck: Normal range of motion. Neck supple. No JVD present. No thyromegaly present.  Cardiovascular: Normal rate, regular rhythm, normal heart sounds and. Exam reveals no gallop and no friction rub. No murmur heard.  Pulmonary/Chest: Effort normal and breath sounds normal. No stridor. No respiratory distress. He has no wheezes. He has no rales. He exhibits no tenderness.  Abdominal: Soft. Bowel sounds are normal. He exhibits no distension. There is no tenderness. There is no rebound and no guarding.  Musculoskeletal: Normal range of motion. He exhibits no edema and no tenderness.  Neurological: He is alert and oriented to person, place, and time. Coordination normal.  Skin: Skin is warm and dry. No rash noted. He is not diaphoretic. No erythema. No pallor.  Psychiatric: He has a normal mood and affect. His behavior is normal. Judgment and thought content normal.       EKG: Sinus  Rhythm  WITHIN NORMAL LIMITS   ASSESSMENT AND PLAN

## 2013-10-28 NOTE — Assessment & Plan Note (Signed)
He is doing well from a cardiac standpoint with no symptoms suggestive of angina. Continue medical therapy. Functional capacity is good and stress test in August of 2014 showed no evidence of ischemia. Thus, he is considered at an overall low risk from a cardiac standpoint for colonoscopy and cholecystectomy. If desired, aspirin can be discontinued 5-7 days before surgery.

## 2013-10-28 NOTE — Assessment & Plan Note (Signed)
Repeat blood pressure manually was 130/80. Continue small dose metoprolol.

## 2013-10-28 NOTE — Assessment & Plan Note (Signed)
Lab Results  Component Value Date   CHOL 130 09/20/2013   HDL 23* 09/20/2013   LDLCALC 53 09/20/2013   TRIG 270* 09/20/2013   CHOLHDL 5.7 09/20/2013   Continue treatment with atorvastatin. His LDL was at target. However, triglyceride was elevated with low HDL. He consumes 4-5 soft drinks a day which is likely contributing to his high triglyceride. I discussed with him the importance of diet and regular exercise in order to improve his other parameters.

## 2013-11-01 ENCOUNTER — Ambulatory Visit: Payer: Self-pay | Admitting: General Surgery

## 2013-11-01 DIAGNOSIS — Z86010 Personal history of colonic polyps: Secondary | ICD-10-CM

## 2013-11-01 DIAGNOSIS — K621 Rectal polyp: Secondary | ICD-10-CM

## 2013-11-01 DIAGNOSIS — K620 Anal polyp: Secondary | ICD-10-CM

## 2013-11-01 DIAGNOSIS — K5730 Diverticulosis of large intestine without perforation or abscess without bleeding: Secondary | ICD-10-CM

## 2013-11-01 HISTORY — PX: COLONOSCOPY: SHX174

## 2013-11-03 ENCOUNTER — Encounter: Payer: Self-pay | Admitting: General Surgery

## 2013-11-03 HISTORY — PX: CHOLECYSTECTOMY: SHX55

## 2013-11-03 LAB — PATHOLOGY REPORT

## 2013-11-04 ENCOUNTER — Ambulatory Visit: Payer: Self-pay | Admitting: General Surgery

## 2013-11-04 ENCOUNTER — Encounter: Payer: Self-pay | Admitting: General Surgery

## 2013-11-04 DIAGNOSIS — K8010 Calculus of gallbladder with chronic cholecystitis without obstruction: Secondary | ICD-10-CM

## 2013-11-07 ENCOUNTER — Encounter: Payer: Self-pay | Admitting: General Surgery

## 2013-11-08 LAB — PATHOLOGY REPORT

## 2013-11-10 ENCOUNTER — Ambulatory Visit: Payer: BC Managed Care – PPO | Admitting: Cardiovascular Disease

## 2013-11-15 ENCOUNTER — Ambulatory Visit (INDEPENDENT_AMBULATORY_CARE_PROVIDER_SITE_OTHER): Payer: BC Managed Care – PPO | Admitting: General Surgery

## 2013-11-15 ENCOUNTER — Encounter: Payer: Self-pay | Admitting: General Surgery

## 2013-11-15 VITALS — BP 127/70 | HR 74 | Resp 12 | Ht 70.0 in | Wt 182.0 lb

## 2013-11-15 DIAGNOSIS — K8020 Calculus of gallbladder without cholecystitis without obstruction: Secondary | ICD-10-CM

## 2013-11-15 DIAGNOSIS — Z860100 Personal history of colon polyps, unspecified: Secondary | ICD-10-CM

## 2013-11-15 NOTE — Progress Notes (Signed)
This is an 53 year old male here today for his post op gallbladder surgery 11/03/13 and colonoscopy on 11/01/13. Patient states he is doing well. Port sites looks clean and healing well.Abdomen is soft and non tender. Lungs are clear. Path showed chronic cholecystitis and cholelithiasis. Colonoscopy -one small hyperplastic polyp Patient to return in 5 year for  colonoscopy.

## 2013-11-17 ENCOUNTER — Encounter: Payer: Self-pay | Admitting: General Surgery

## 2013-12-01 ENCOUNTER — Encounter: Payer: Self-pay | Admitting: *Deleted

## 2013-12-01 DIAGNOSIS — K2270 Barrett's esophagus without dysplasia: Secondary | ICD-10-CM | POA: Insufficient documentation

## 2013-12-05 ENCOUNTER — Encounter: Payer: Self-pay | Admitting: *Deleted

## 2013-12-05 DIAGNOSIS — Z1211 Encounter for screening for malignant neoplasm of colon: Secondary | ICD-10-CM | POA: Insufficient documentation

## 2013-12-08 ENCOUNTER — Other Ambulatory Visit: Payer: Self-pay | Admitting: Physician Assistant

## 2013-12-09 NOTE — Telephone Encounter (Signed)
Dr Tamala Julian, you saw pt for check up in Jan, but no eval for GERD since 05/2013. Do you want to give RFs?

## 2013-12-09 NOTE — Telephone Encounter (Signed)
Rx approved

## 2013-12-25 ENCOUNTER — Other Ambulatory Visit: Payer: Self-pay | Admitting: Family Medicine

## 2013-12-26 ENCOUNTER — Encounter: Payer: Self-pay | Admitting: Family Medicine

## 2013-12-26 ENCOUNTER — Ambulatory Visit (INDEPENDENT_AMBULATORY_CARE_PROVIDER_SITE_OTHER): Payer: BC Managed Care – PPO | Admitting: Family Medicine

## 2013-12-26 VITALS — BP 127/74 | HR 76 | Temp 98.0°F | Resp 16 | Ht 70.0 in | Wt 179.6 lb

## 2013-12-26 DIAGNOSIS — F418 Other specified anxiety disorders: Secondary | ICD-10-CM

## 2013-12-26 DIAGNOSIS — D649 Anemia, unspecified: Secondary | ICD-10-CM | POA: Insufficient documentation

## 2013-12-26 DIAGNOSIS — E785 Hyperlipidemia, unspecified: Secondary | ICD-10-CM

## 2013-12-26 DIAGNOSIS — E119 Type 2 diabetes mellitus without complications: Secondary | ICD-10-CM

## 2013-12-26 DIAGNOSIS — K219 Gastro-esophageal reflux disease without esophagitis: Secondary | ICD-10-CM

## 2013-12-26 DIAGNOSIS — I10 Essential (primary) hypertension: Secondary | ICD-10-CM

## 2013-12-26 DIAGNOSIS — E7800 Pure hypercholesterolemia, unspecified: Secondary | ICD-10-CM

## 2013-12-26 DIAGNOSIS — Z86010 Personal history of colonic polyps: Secondary | ICD-10-CM

## 2013-12-26 DIAGNOSIS — G4733 Obstructive sleep apnea (adult) (pediatric): Secondary | ICD-10-CM

## 2013-12-26 DIAGNOSIS — F341 Dysthymic disorder: Secondary | ICD-10-CM

## 2013-12-26 DIAGNOSIS — Z9989 Dependence on other enabling machines and devices: Secondary | ICD-10-CM

## 2013-12-26 DIAGNOSIS — D509 Iron deficiency anemia, unspecified: Secondary | ICD-10-CM

## 2013-12-26 LAB — HEMOGLOBIN A1C
Hgb A1c MFr Bld: 9.2 % — ABNORMAL HIGH (ref <5.7)
Mean Plasma Glucose: 217 mg/dL — ABNORMAL HIGH (ref <117)

## 2013-12-26 LAB — COMPLETE METABOLIC PANEL WITH GFR
ALBUMIN: 4.4 g/dL (ref 3.5–5.2)
ALK PHOS: 89 U/L (ref 39–117)
ALT: 31 U/L (ref 0–53)
AST: 22 U/L (ref 0–37)
BUN: 9 mg/dL (ref 6–23)
CALCIUM: 9.6 mg/dL (ref 8.4–10.5)
CHLORIDE: 100 meq/L (ref 96–112)
CO2: 24 mEq/L (ref 19–32)
Creat: 0.66 mg/dL (ref 0.50–1.35)
GFR, Est African American: >89 mL/min
GLUCOSE: 157 mg/dL — AB (ref 70–99)
POTASSIUM: 4.4 meq/L (ref 3.5–5.3)
Sodium: 136 mEq/L (ref 135–145)
Total Bilirubin: 0.4 mg/dL (ref 0.2–1.2)
Total Protein: 7 g/dL (ref 6.0–8.3)

## 2013-12-26 LAB — CBC WITH DIFFERENTIAL/PLATELET
BASOS ABS: 0.1 10*3/uL (ref 0.0–0.1)
Basophils Relative: 1 % (ref 0–1)
Eosinophils Absolute: 0.4 10*3/uL (ref 0.0–0.7)
Eosinophils Relative: 7 % — ABNORMAL HIGH (ref 0–5)
HCT: 39.9 % (ref 39.0–52.0)
Hemoglobin: 13.2 g/dL (ref 13.0–17.0)
LYMPHS ABS: 1.7 10*3/uL (ref 0.7–4.0)
LYMPHS PCT: 27 % (ref 12–46)
MCH: 26.5 pg (ref 26.0–34.0)
MCHC: 33.1 g/dL (ref 30.0–36.0)
MCV: 80 fL (ref 78.0–100.0)
Monocytes Absolute: 0.3 10*3/uL (ref 0.1–1.0)
Monocytes Relative: 5 % (ref 3–12)
NEUTROS ABS: 3.7 10*3/uL (ref 1.7–7.7)
Neutrophils Relative %: 60 % (ref 43–77)
PLATELETS: 320 10*3/uL (ref 150–400)
RBC: 4.99 MIL/uL (ref 4.22–5.81)
RDW: 14.9 % (ref 11.5–15.5)
WBC: 6.2 10*3/uL (ref 4.0–10.5)

## 2013-12-26 LAB — LIPID PANEL
Cholesterol: 137 mg/dL (ref 0–200)
HDL: 35 mg/dL — ABNORMAL LOW (ref >39)
LDL CALC: 26 mg/dL (ref 0–99)
Total CHOL/HDL Ratio: 3.9 Ratio
Triglycerides: 378 mg/dL — ABNORMAL HIGH (ref <150)
VLDL: 76 mg/dL — AB (ref 0–40)

## 2013-12-26 MED ORDER — METOPROLOL TARTRATE 25 MG PO TABS: 25.0000 mg | ORAL_TABLET | Freq: Two times a day (BID) | ORAL | Status: DC

## 2013-12-26 NOTE — Addendum Note (Signed)
Addended by: [name] on: 12/26/2013 03:01 PM   Modules accepted: Orders

## 2013-12-26 NOTE — Progress Notes (Signed)
Subjective:    Patient ID: Joseph Terry, male    DOB: [birth year], 53 y.o.   MRN: [id number]  HPI Presents with 3 month diabetes check up.  HPI Comments:  Pt has had his gall bladder removed since he was last seen. He does not report any pain or complications following surgery. Pt was out of work for 8 days due to surgery. Pt does not have to follow-up with physician. He states that he has to pay for his colonoscopy since it was not reported as Wellness to his insurance company.  Pt has lost approximately 8 lbs since last visit.  OSA:  Pt will get CPAP adjusted. He states that he slept until 10:30 on Saturday after babysitting his son's dog the day before. He feels like CPAP is really helping; he did not fall asleep with CPAP in place on Saturday and he needed much more sleep that night.  Depression with anxiety:  Pt reports being well emotionally. He states that things are better with his mother's death. Pt reports good energy level.  Patient reports good compliance with medication, good tolerance to medication, and good symptom control.    DMII: no changes to management made at last visit; Patient reports good compliance with medication, good tolerance to medication, and good symptom control.  Does not check sugars at home.    Hyperlipidemia:  No changes to management made at last visit; Patient reports good compliance with medication, good tolerance to medication, and good symptom control.    Mild hay fever this season.    No cp, SOB or cough. Occasional diarrhea. Pt does not exercise  GERD:  He reports that his heartburn is better since taking Nexium and then a "green pill" that was sent to his pharmacy.   Past Medical History  Diagnosis Date  . Unstable angina 01/02/2012  . Cardiovascular stress test abnormal 01/02/2012  . Type II diabetes mellitus   . S/P CABG x 5 01/05/2012    LIMA to LAD, SVG to D2, SVG to OM, sequential SVG to RCA and PDA, EVH via right thigh and leg  . GERD  (gastroesophageal reflux disease)   . Hypertension   . Hyperlipidemia   . CAD (coronary artery disease), severe multivessel CAD surgical consult for CABG 01/02/2012    3 vessel CAD with normal EF. s/p CABG x 5: LIMA to LAD, SVG to distal RCA with sequential SVG to PDA, SVG to OM, and SVG to second diagonal  . Unspecified vitamin D deficiency   . Anemia   . Communicable disease   . Cervicalgia   . Anxiety state, unspecified   . Impotence of organic origin   . Internal hemorrhoids without mention of complication   . Personal history of colonic polyps   . Diaphragmatic hernia without mention of obstruction or gangrene   . Depression   . OSA on CPAP 07/2013    Sleep study 07/2013 +sever OSA; CPAP at 10cm recommended.  . Anemia   . Kidney stone   . Diverticulosis    Past Surgical History  Procedure Laterality Date  . Rotator cuff curgery  2011    lt rotator cuff  . Cardiac catheterization  4 /19/ 2013    [place]   . Coronary artery bypass graft  01/05/2012    Procedure: CORONARY ARTERY BYPASS GRAFTING (CABG);  Surgeon: Rexene Alberts, MD;  Location: [facility];  Service: Open Heart Surgery;  Laterality: N/A;  coronary artery bypass graft times five using left  internal mammary artery and right leg greater saphenous vein harvested endoscopically   . Shoulder surgery  1997    bilateral  . Colonoscopy w/ polypectomy  09/16/2007    colon polyps.  Repeat 5 years. Iftikhar.  . Polysomnography  07/2013    +severe OSA; CPAP at 10cm pressure recommended.  . Cholecystectomy  11/03/13  . Colonoscopy  11/01/13    multiple polyps; diverticulosis. [facility]. Repeat 5 years.   Allergies  Allergen Reactions  . Penicillins Itching    tingling  . Tramadol Rash   Current Outpatient Prescriptions on File Prior to Visit  Medication Sig Dispense Refill  . aspirin 81 MG tablet Take 325 mg by mouth daily.       Marland Kitchen atorvastatin (LIPITOR) 40 MG tablet Take 1 tablet (40 mg total) by mouth daily.  30 tablet   11  . clonazePAM (KLONOPIN) 1 MG tablet TAKE 1 TABLET BY MOUTH TWICE A DAY AS NEEDED  60 tablet  3  . esomeprazole (NEXIUM) 40 MG capsule Take 40 mg by mouth daily before breakfast.      . ferrous sulfate 325 (65 FE) MG tablet Take 325 mg by mouth daily with breakfast.      . glipiZIDE (GLUCOTROL XL) 5 MG 24 hr tablet TAKE 1 TABLET BY MOUTH DAILY. TAKE WITH LARGEST MEAL OF THE DAY.  30 tablet  5  . hydrocortisone 2.5 % ointment Apply topically 2 (two) times daily.  20 g  0  . metFORMIN (GLUCOPHAGE) 1000 MG tablet Take 1 tablet (1,000 mg total) by mouth 2 (two) times daily with a meal.  60 tablet  11  . NEXIUM 40 MG capsule TAKE ONE CAPSULE BY MOUTH EVERY DAY  30 capsule  11  . Omega-3 Fatty Acids (FISH OIL) 1200 MG CAPS Take by mouth 2 (two) times daily.      Marland Kitchen venlafaxine XR (EFFEXOR-XR) 150 MG 24 hr capsule Take 1 capsule (150 mg total) by mouth daily.  30 capsule  11  . polyethylene glycol powder (GLYCOLAX/MIRALAX) powder 255 grams one bottle for colonoscopy prep  255 g  0   No current facility-administered medications on file prior to visit.     Review of Systems  Constitutional: Negative for fever, chills, diaphoresis and fatigue.  Respiratory: Negative for cough and shortness of breath.   Cardiovascular: Negative for chest pain, palpitations and leg swelling.  Gastrointestinal: Positive for diarrhea (occasional). Negative for nausea, vomiting, abdominal pain, constipation, blood in stool and anal bleeding.  Endocrine: Negative for polydipsia, polyphagia and polyuria.  Psychiatric/Behavioral: Negative for sleep disturbance and dysphoric mood. The patient is not nervous/anxious.        Objective:   Physical Exam  Nursing note and vitals reviewed. Constitutional: He is oriented to person, place, and time. He appears well-developed and well-nourished. No distress.  HENT:  Head: Normocephalic and atraumatic.  Right Ear: External ear normal.  Left Ear: External ear normal.  Nose:  Nose normal.  Mouth/Throat: Oropharynx is clear and moist.  Eyes: Conjunctivae and EOM are normal. Pupils are equal, round, and reactive to light.  Neck: Normal range of motion. Neck supple. Carotid bruit is not present. No thyromegaly present.  Cardiovascular: Normal rate, regular rhythm, normal heart sounds and intact distal pulses.  Exam reveals no gallop and no friction rub.   No murmur heard. Pulmonary/Chest: Effort normal and breath sounds normal. No respiratory distress. He has no wheezes. He has no rales.  Abdominal: Soft. Bowel sounds are normal. He exhibits  no distension and no mass. There is no tenderness. There is no rebound and no guarding.  Well healed incisions abdomen.  Lymphadenopathy:    He has no cervical adenopathy.  Neurological: He is alert and oriented to person, place, and time. No cranial nerve deficit.  Skin: Skin is warm and dry. No rash noted. He is not diaphoretic.  Psychiatric: He has a normal mood and affect. His behavior is normal.      Assessment & Plan:   1. Type II or unspecified type diabetes mellitus without mention of complication, not stated as uncontrolled   2. Essential hypertension, benign   3. Pure hypercholesterolemia   4. Dyslipidemia   5. OSA on CPAP   6. GERD (gastroesophageal reflux disease)   7. Depression with anxiety   8. Hypertension   9. Personal history of colonic polyps   10. Anemia    1.  DMII: moderately controlled; obtain labs; continue current medications. 2.  HTN: controlled; refill of Metoprolol provided. 3.  Dyslipidemia:  Moderately controlled; obtain labs; goal LDL< 70 due to CAD.  No changes to management. 4.  OSA on CPAP: stable; improved energy.  No changes to management. 5.  GERD: improved control; apparent switch in therapy at pharmacy; will clarify switch. 6.  Depression with anxiety: controlled; no changes to management. 7.  S/p cholecystectomy: stable. Recovered well. 8. Anemia: stable; repeat labs today; s/p  colonoscopy by [facility] for hemoccult + stools with acute anemia. 9.  Hemmocult + stools: s/p colonoscopy by [facility]. 10. Colon polyps: s/p repeat colonoscopy with multiple polyps; repeat in 5 years.  Meds ordered this encounter  Medications  . metoprolol tartrate (LOPRESSOR) 25 MG tablet    Sig: Take 1 tablet (25 mg total) by mouth 2 (two) times daily.    Dispense:  60 tablet    Refill:  11   I personally performed the services described in this documentation, which was scribed in my presence.  The recorded information has been reviewed and is accurate.  Reginia Forts, M.D.  [facility] [address] [place], [place]  [address] [phone] phone [phone] fax

## 2013-12-29 ENCOUNTER — Encounter: Payer: Self-pay | Admitting: Family Medicine

## 2013-12-29 MED ORDER — GLIPIZIDE ER 10 MG PO TB24: 10.0000 mg | ORAL_TABLET | Freq: Every day | ORAL | Status: DC

## 2013-12-29 NOTE — Addendum Note (Signed)
Addended by: [name] on: 12/29/2013 10:38 AM   Modules accepted: Orders

## 2014-01-13 ENCOUNTER — Other Ambulatory Visit: Payer: Self-pay | Admitting: Family Medicine

## 2014-01-13 NOTE — Telephone Encounter (Signed)
Please call in refill for Clonazepam as approved.

## 2014-01-16 NOTE — Telephone Encounter (Signed)
faxed

## 2014-01-25 ENCOUNTER — Encounter: Payer: Self-pay | Admitting: Family Medicine

## 2014-03-22 ENCOUNTER — Other Ambulatory Visit: Payer: Self-pay | Admitting: Family Medicine

## 2014-03-23 ENCOUNTER — Other Ambulatory Visit: Payer: Self-pay | Admitting: Family Medicine

## 2014-04-06 ENCOUNTER — Telehealth: Payer: Self-pay | Admitting: *Deleted

## 2014-04-06 DIAGNOSIS — I25810 Atherosclerosis of coronary artery bypass graft(s) without angina pectoris: Secondary | ICD-10-CM

## 2014-04-06 NOTE — Telephone Encounter (Signed)
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

## 2014-04-06 NOTE — Telephone Encounter (Signed)
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 I told him that Dr. Fletcher Anon may order it the day of his visit. He wants it same day for his cdl????

## 2014-04-07 NOTE — Telephone Encounter (Signed)
Schedule GXT and office visit on same day.

## 2014-04-07 NOTE — Telephone Encounter (Signed)
GXT Scheduled  Patient aware

## 2014-04-11 ENCOUNTER — Ambulatory Visit (INDEPENDENT_AMBULATORY_CARE_PROVIDER_SITE_OTHER): Payer: BC Managed Care – PPO | Admitting: Cardiovascular Disease

## 2014-04-11 ENCOUNTER — Ambulatory Visit: Payer: BC Managed Care – PPO | Admitting: Cardiovascular Disease

## 2014-04-11 ENCOUNTER — Encounter: Payer: Self-pay | Admitting: Cardiovascular Disease

## 2014-04-11 VITALS — BP 136/85 | HR 96 | Ht 70.0 in | Wt 185.0 lb

## 2014-04-11 VITALS — Ht 70.0 in | Wt 185.0 lb

## 2014-04-11 DIAGNOSIS — E785 Hyperlipidemia, unspecified: Secondary | ICD-10-CM

## 2014-04-11 DIAGNOSIS — I2510 Atherosclerotic heart disease of native coronary artery without angina pectoris: Secondary | ICD-10-CM

## 2014-04-11 DIAGNOSIS — R0602 Shortness of breath: Secondary | ICD-10-CM

## 2014-04-11 DIAGNOSIS — I25810 Atherosclerosis of coronary artery bypass graft(s) without angina pectoris: Secondary | ICD-10-CM

## 2014-04-11 DIAGNOSIS — I25119 Atherosclerotic heart disease of native coronary artery with unspecified angina pectoris: Secondary | ICD-10-CM

## 2014-04-11 DIAGNOSIS — I10 Essential (primary) hypertension: Secondary | ICD-10-CM

## 2014-04-11 DIAGNOSIS — I209 Angina pectoris, unspecified: Secondary | ICD-10-CM

## 2014-04-11 NOTE — Procedures (Signed)
   Treadmill Stress test  Indication: Coronary artery disease and shortness of breath.  Baseline Data:  Resting EKG shows NSR with rate of 96 bpm, no significant ST or T wave changes. Resting blood pressure of 137/83 mm Hg Stand bruce protocal was used.  Exercise Data:  Patient exercised for 6 min 21 sec,  Peak heart rate of 164 bpm.  This was 98 % of the maximum predicted heart rate. No symptoms of chest pain or lightheadedness were reported at peak stress or in recovery.  Peak Blood pressure recorded was 217/98 Maximal work level: 7.5 METs.  Heart rate at 3 minutes in recovery was 120 bpm. BP response: Hypertensive HR response: Normal  EKG with Exercise: Sinus tachycardia with 1 mm of horizontal ST depression in V3 to V6. These changes resolved after 8 minutes recovery.  FINAL IMPRESSION: Mildly abnormal exercise stress test. However, exercise tolerance seems to be the same as last year. He actually exercised longer this year compared to most recent death. Heart rate was higher and he also had hypertensive response to exercise. Thus, the current EKG changes could be related to that.   Recommendation: Continue medical therapy.

## 2014-04-11 NOTE — Patient Instructions (Signed)
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   Continue same medications.   Your physician wants you to follow-up in: 6 months.  You will receive a reminder letter in the mail two months in advance. If you don't receive a letter, please call our office to schedule the follow-up appointment.

## 2014-04-11 NOTE — Progress Notes (Signed)
HPI  This is a 53 year old male who is here today for a followup visit regarding coronary artery disease status post CABG x5 in April 2013.  He has chronic medical conditions that include type 2 diabetes, sleep apnea, hypertension, hyperlipidemia and previous tobacco use. He had anemia few months after the surgery which resolved completely after treatment with iron supplementation. He has been doing and denies any chest pain or significant shortness of breath. 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  Allergies  Allergen Reactions  . Penicillins Itching    tingling  . Tramadol Rash     Current Outpatient Prescriptions on File Prior to Visit  Medication Sig Dispense Refill  . atorvastatin (LIPITOR) 40 MG tablet TAKE 1 TABLET (40 MG TOTAL) BY MOUTH DAILY.  30 tablet  10  . clonazePAM (KLONOPIN) 1 MG tablet TAKE 1 TABLET BY MOUTH TWICE A DAY AS NEEDED  60 tablet  5  . esomeprazole (NEXIUM) 40 MG capsule Take 40 mg by mouth daily before breakfast.      . glipiZIDE (GLUCOTROL XL) 10 MG 24 hr tablet Take 1 tablet (10 mg total) by mouth daily with supper.  30 tablet  5  . metFORMIN (GLUCOPHAGE) 1000 MG tablet TAKE 1 TABLET (1,000 MG TOTAL) BY MOUTH 2 (TWO) TIMES DAILY WITH A MEAL.  60 tablet  8  . metoprolol tartrate (LOPRESSOR) 25 MG tablet Take 1 tablet (25 mg total) by mouth 2 (two) times daily.  60 tablet  11  . Omega-3 Fatty Acids (FISH OIL) 1200 MG CAPS Take by mouth 2 (two) times daily.      Marland Kitchen venlafaxine XR (EFFEXOR-XR) 150 MG 24 hr capsule TAKE 1 CAPSULE (150 MG TOTAL) BY MOUTH DAILY.  30 capsule  8   No current facility-administered medications on file prior to visit.     Past Medical History  Diagnosis Date  . Unstable angina 01/02/2012  . Cardiovascular stress test abnormal 01/02/2012  . Type II diabetes mellitus   . S/P CABG x 5 01/05/2012    LIMA to LAD, SVG to D2, SVG to OM, sequential SVG to RCA and PDA, EVH via right thigh and leg  .  GERD (gastroesophageal reflux disease)   . Hypertension   . Hyperlipidemia   . CAD (coronary artery disease), severe multivessel CAD surgical consult for CABG 01/02/2012    3 vessel CAD with normal EF. s/p CABG x 5: LIMA to LAD, SVG to distal RCA with sequential SVG to PDA, SVG to OM, and SVG to second diagonal  . Unspecified vitamin D deficiency   . Anemia   . Communicable disease   . Cervicalgia   . Anxiety state, unspecified   . Impotence of organic origin   . Internal hemorrhoids without mention of complication   . Personal history of colonic polyps   . Diaphragmatic hernia without mention of obstruction or gangrene   . Depression   . OSA on CPAP 07/2013    Sleep study 07/2013 +sever OSA; CPAP at 10cm recommended.  . Anemia   . Kidney stone   . Diverticulosis      Past Surgical History  Procedure Laterality Date  . Rotator cuff curgery  2011    lt rotator cuff  . Cardiac catheterization  4 /19/ 2013    [place]   . Coronary artery bypass graft  01/05/2012    Procedure: CORONARY ARTERY BYPASS GRAFTING (CABG);  Surgeon: Rexene Alberts, MD;  Location: [facility] OR;  Service: Open Heart Surgery;  Laterality: N/A;  coronary artery bypass graft times five using left internal mammary artery and right leg greater saphenous vein harvested endoscopically   . Shoulder surgery  1997    bilateral  . Colonoscopy w/ polypectomy  09/16/2007    colon polyps.  Repeat 5 years. Iftikhar.  . Polysomnography  07/2013    +severe OSA; CPAP at 10cm pressure recommended.  . Cholecystectomy  11/03/13  . Colonoscopy  11/01/13    multiple polyps; diverticulosis. Sankar. Repeat 5 years.     Family History  Problem Relation Age of Onset  . Heart attack Mother     defribillator in place  . Hypertension Mother   . Hyperlipidemia Mother   . Heart disease Mother     CHF with defibrillator;  CAD.  . Diabetes Mother   . Cancer Mother 75    Lung cancer  . Hypertension Father   . Hyperlipidemia Father     . Cancer Father 69     prostate    implants  . COPD Father   . Hypertension Sister   . Heart disease Maternal Grandmother   . Heart disease Maternal Grandfather   . Cancer Paternal Grandmother   . Cancer Paternal Grandfather   . Hypertension Sister      History   Social History  . Marital Status: Divorced    Spouse Name: N/A    Number of Children: 1  . Years of Education: N/A   Occupational History  . [employer]     x 30 yrs   Social History Main Topics  . Smoking status: Former Smoker -- 1.50 packs/day for 35 years    Types: Cigarettes    Quit date: 11/14/2011  . Smokeless tobacco: Not on file     Comment: quit 2 weeks prior to CABG 12/2011  . Alcohol Use: 0.6 oz/week    1 Cans of beer per week     Comment: Drinks once per month; beer.  . Drug Use: No  . Sexual Activity: Yes    Birth Control/ Protection: Condom   Other Topics Concern  . Not on file   Social History Narrative   Marital status: divorced since 1996; dating casually in 2014.      Lives: Lives alone.          Children:  One son and a granddaughter 4 mo old live in [place].       Exercise: Light, yard work; playing with son's puppy formal        Employment:  Works 39 - 70 hrs. Some weeks.       Guns:  Guns in the home not stored in locked cabinet. Smoke alarm in home, Always wears seatbelts.       Sexual History: 39; Last HIV testing 1996 with divorce; has has 81 partners since divorce.               PHYSICAL EXAM   BP 136/85  Pulse 96  Ht 5\' 10"  (1.778 m)  Wt 185 lb (83.915 kg)  BMI 26.54 kg/m2 Constitutional: He is oriented to person, place, and time. He appears well-developed and well-nourished. No distress.  HENT: No nasal discharge.  Head: Normocephalic and atraumatic.  Eyes: Pupils are equal and round. Right eye exhibits no discharge. Left eye exhibits no discharge.  Neck: Normal range of motion. Neck supple. No JVD present. No thyromegaly present.  Cardiovascular: Normal  rate, regular rhythm, normal heart sounds  and. Exam reveals no gallop and no friction rub. No murmur heard.  Pulmonary/Chest: Effort normal and breath sounds normal. No stridor. No respiratory distress. He has no wheezes. He has no rales. He exhibits no tenderness.  Abdominal: Soft. Bowel sounds are normal. He exhibits no distension. There is no tenderness. There is no rebound and no guarding.  Musculoskeletal: Normal range of motion. He exhibits no edema and no tenderness.  Neurological: He is alert and oriented to person, place, and time. Coordination normal.  Skin: Skin is warm and dry. No rash noted. He is not diaphoretic. No erythema. No pallor.  Psychiatric: He has a normal mood and affect. His behavior is normal. Judgment and thought content normal.        ASSESSMENT AND PLAN

## 2014-04-13 ENCOUNTER — Encounter: Payer: Self-pay | Admitting: Cardiovascular Disease

## 2014-04-13 NOTE — Assessment & Plan Note (Signed)
Lab Results  Component Value Date   CHOL 137 12/26/2013   HDL 35* 12/26/2013   LDLCALC 26 12/26/2013   TRIG 378* 12/26/2013   CHOLHDL 3.9 12/26/2013   Continue treatment with atorvastatin.

## 2014-04-13 NOTE — Assessment & Plan Note (Signed)
He is overall doing well with no significant change in symptoms. He underwent a treadmill stress test which was mildly positive for ischemia. However, he exercised more than last year with hypertensive response to exercise which likely contributed to EKG changes. It appears that his symptoms and functional capacity are still  unchange from last year.  Continue medical therapy. 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

## 2014-04-13 NOTE — Assessment & Plan Note (Signed)
Blood pressure is reasonably controlled on current medications. 

## 2014-04-21 ENCOUNTER — Ambulatory Visit: Payer: BC Managed Care – PPO | Admitting: Cardiovascular Disease

## 2014-05-15 ENCOUNTER — Ambulatory Visit (INDEPENDENT_AMBULATORY_CARE_PROVIDER_SITE_OTHER): Payer: BC Managed Care – PPO | Admitting: Family Medicine

## 2014-05-15 ENCOUNTER — Encounter: Payer: Self-pay | Admitting: Family Medicine

## 2014-05-15 VITALS — BP 130/78 | HR 70 | Temp 97.6°F | Resp 16 | Ht 70.0 in | Wt 182.0 lb

## 2014-05-15 DIAGNOSIS — H6123 Impacted cerumen, bilateral: Secondary | ICD-10-CM

## 2014-05-15 DIAGNOSIS — I25709 Atherosclerosis of coronary artery bypass graft(s), unspecified, with unspecified angina pectoris: Secondary | ICD-10-CM

## 2014-05-15 DIAGNOSIS — I209 Angina pectoris, unspecified: Secondary | ICD-10-CM

## 2014-05-15 DIAGNOSIS — G4733 Obstructive sleep apnea (adult) (pediatric): Secondary | ICD-10-CM

## 2014-05-15 DIAGNOSIS — Z7251 High risk heterosexual behavior: Secondary | ICD-10-CM

## 2014-05-15 DIAGNOSIS — Z125 Encounter for screening for malignant neoplasm of prostate: Secondary | ICD-10-CM

## 2014-05-15 DIAGNOSIS — Z0000 Encounter for general adult medical examination without abnormal findings: Secondary | ICD-10-CM

## 2014-05-15 DIAGNOSIS — F3289 Other specified depressive episodes: Secondary | ICD-10-CM

## 2014-05-15 DIAGNOSIS — H6120 Impacted cerumen, unspecified ear: Secondary | ICD-10-CM

## 2014-05-15 DIAGNOSIS — E7800 Pure hypercholesterolemia, unspecified: Secondary | ICD-10-CM

## 2014-05-15 DIAGNOSIS — I25810 Atherosclerosis of coronary artery bypass graft(s) without angina pectoris: Secondary | ICD-10-CM

## 2014-05-15 DIAGNOSIS — E119 Type 2 diabetes mellitus without complications: Secondary | ICD-10-CM

## 2014-05-15 DIAGNOSIS — F32A Depression, unspecified: Secondary | ICD-10-CM

## 2014-05-15 DIAGNOSIS — Z9989 Dependence on other enabling machines and devices: Secondary | ICD-10-CM

## 2014-05-15 DIAGNOSIS — F329 Major depressive disorder, single episode, unspecified: Secondary | ICD-10-CM

## 2014-05-15 DIAGNOSIS — K219 Gastro-esophageal reflux disease without esophagitis: Secondary | ICD-10-CM

## 2014-05-15 LAB — LIPID PANEL
Cholesterol: 152 mg/dL (ref 0–200)
HDL: 35 mg/dL — AB (ref >39)
Total CHOL/HDL Ratio: 4.3 Ratio
Triglycerides: 549 mg/dL — ABNORMAL HIGH (ref <150)

## 2014-05-15 LAB — COMPREHENSIVE METABOLIC PANEL
ALBUMIN: 4.9 g/dL (ref 3.5–5.2)
ALK PHOS: 98 U/L (ref 39–117)
ALT: 49 U/L (ref 0–53)
AST: 26 U/L (ref 0–37)
BUN: 10 mg/dL (ref 6–23)
CALCIUM: 10.2 mg/dL (ref 8.4–10.5)
CHLORIDE: 99 meq/L (ref 96–112)
CO2: 27 meq/L (ref 19–32)
Creat: 0.8 mg/dL (ref 0.50–1.35)
GLUCOSE: 178 mg/dL — AB (ref 70–99)
POTASSIUM: 4.6 meq/L (ref 3.5–5.3)
Sodium: 137 mEq/L (ref 135–145)
TOTAL PROTEIN: 7.8 g/dL (ref 6.0–8.3)
Total Bilirubin: 0.4 mg/dL (ref 0.2–1.2)

## 2014-05-15 LAB — HIV ANTIBODY (ROUTINE TESTING W REFLEX): HIV 1&2 Ab, 4th Generation: NONREACTIVE

## 2014-05-15 LAB — CBC
HEMATOCRIT: 42.9 % (ref 39.0–52.0)
HEMOGLOBIN: 14.2 g/dL (ref 13.0–17.0)
MCH: 26 pg (ref 26.0–34.0)
MCHC: 33.1 g/dL (ref 30.0–36.0)
MCV: 78.6 fL (ref 78.0–100.0)
Platelets: 335 10*3/uL (ref 150–400)
RBC: 5.46 MIL/uL (ref 4.22–5.81)
RDW: 16 % — ABNORMAL HIGH (ref 11.5–15.5)
WBC: 7.5 10*3/uL (ref 4.0–10.5)

## 2014-05-15 LAB — MICROALBUMIN / CREATININE URINE RATIO
CREATININE, URINE: 230.5 mg/dL
Microalb Creat Ratio: 5.3 mg/g (ref 0.0–30.0)
Microalb, Ur: 1.23 mg/dL (ref 0.00–1.89)

## 2014-05-15 LAB — RPR

## 2014-05-15 LAB — POCT URINALYSIS DIPSTICK
Glucose, UA: 500
Leukocytes, UA: NEGATIVE
NITRITE UA: NEGATIVE
Protein, UA: NEGATIVE
RBC UA: NEGATIVE
SPEC GRAV UA: 1.02
UROBILINOGEN UA: 0.2
pH, UA: 5

## 2014-05-15 LAB — HEMOGLOBIN A1C
HEMOGLOBIN A1C: 10.1 % — AB (ref <5.7)
Mean Plasma Glucose: 243 mg/dL — ABNORMAL HIGH (ref <117)

## 2014-05-15 LAB — TSH: TSH: 1.674 u[IU]/mL (ref 0.350–4.500)

## 2014-05-15 MED ORDER — VENLAFAXINE HCL ER 150 MG PO CP24: 150.0000 mg | ORAL_CAPSULE | Freq: Every day | ORAL | Status: DC

## 2014-05-15 MED ORDER — METOPROLOL TARTRATE 25 MG PO TABS: 25.0000 mg | ORAL_TABLET | Freq: Two times a day (BID) | ORAL | Status: DC

## 2014-05-15 MED ORDER — ATORVASTATIN CALCIUM 40 MG PO TABS: 40.0000 mg | ORAL_TABLET | Freq: Every day | ORAL | Status: DC

## 2014-05-15 MED ORDER — ESOMEPRAZOLE MAGNESIUM 40 MG PO PACK: 40.0000 mg | PACK | Freq: Every day | ORAL | Status: DC

## 2014-05-15 MED ORDER — GLIPIZIDE ER 10 MG PO TB24: 10.0000 mg | ORAL_TABLET | Freq: Every day | ORAL | Status: DC

## 2014-05-15 MED ORDER — METFORMIN HCL 1000 MG PO TABS: 1000.0000 mg | ORAL_TABLET | Freq: Two times a day (BID) | ORAL | Status: DC

## 2014-05-15 MED ORDER — CLONAZEPAM 1 MG PO TABS: 1.0000 mg | ORAL_TABLET | Freq: Two times a day (BID) | ORAL | Status: DC

## 2014-05-15 NOTE — Progress Notes (Signed)
Subjective:  This chart was scribed for Joseph Honour, MD by Joseph Terry, ED Scribe. The patient was seen in room 21. Patient's care was started at 8:30 AM.   Patient ID: Joseph Terry, male    DOB: Sep 13, 1961, 53 y.o.   MRN: [id number]  05/15/2014  Annual Exam, Diabetes, Depression and Hyperlipidemia  HPI HPI Comments: Joseph Terry is a 53 y.o. male who presents to the [facility] for an annual exam. Pt was seen 4 months ago for DM, hyperlipidemia and depression f/u. The only change in management was to increase Glucotrol to 10mg  daily. Pt had an A1C of 9.2. Last CPE 02/28/13. Last colonoscopy 2015. Tday 2009. Pneumovax 2006. Refuses flu vaccines. Refuses Hepatitis B series.   Eye exam: 11/2013, no changes in eyes; Dr. Matilde Terry in [place]. Dental exam: 2014; [facility]. Pt reports intermittent symptoms such as hypoglycemia with increasing Glucotrol but states that he is doing well with the medication overall.   Pt reports that his L ear has been "clogged" over the past month. Pt states that he wears a cpap at night and is not able to remove cpap in time to sneeze or cough. Pt attributes L ear pressure to this. He reports 2 episodes of dizziness and mild congestion. He denies mouth sore, hearing loss, ear discharge/bleeding.   Pt also reports substernal chest pain that is exacerbated with moving his arms. He denies chest pain with walking, palpitations, SOB, cough, neck pain, arthralgias, numbness/tingling, leg swelling, constipation, diarrhea, blood in stool, nausea, vomiting, change in sexual desire. Pt reports getting up occasionally once to urinate at night.   Pt's father is 66 y.o with no new health problems; h/o prostate CA in his 47s; no h/o MI or TIA. Pt's siblings have no health complications. Pt's granddaughter is 67 y.o now. Pt denies tobacco use but reports drinking 6-8 beers once a month. Pt admits to not exercising as much as he should. 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 He reports more stress with his job. Pt states that he is doing well emotionally.   Pt went to [facility] for summer vacation. Pt is not currently dating.   Review of Systems  Constitutional: Negative for fever, chills, diaphoresis, activity change, appetite change, fatigue and unexpected weight change.  HENT: Positive for congestion and hearing loss. Negative for dental problem, drooling, ear discharge, ear pain, facial swelling, mouth sores, nosebleeds, postnasal drip, rhinorrhea, sinus pressure, sneezing, sore throat, tinnitus, trouble swallowing and voice change.   Eyes: Negative for photophobia, pain, discharge, redness, itching and visual disturbance.  Respiratory: Negative for apnea, cough, choking, chest tightness, shortness of breath, wheezing and stridor.   Cardiovascular: Negative for chest pain, palpitations and leg swelling.  Gastrointestinal: Negative for nausea, vomiting, abdominal pain, diarrhea, constipation and blood in stool.  Endocrine: Negative for cold intolerance, heat intolerance, polydipsia, polyphagia and polyuria.  Genitourinary: Negative for dysuria, urgency, frequency, hematuria, flank pain, decreased urine volume, discharge, penile swelling, scrotal swelling, enuresis, difficulty urinating, genital sores, penile pain and testicular pain.  Musculoskeletal: Negative for arthralgias, back pain, gait problem, joint swelling, myalgias, neck pain and neck stiffness.  Skin: Negative for color change, pallor, rash and wound.  Allergic/Immunologic: Negative for environmental allergies, food allergies and immunocompromised state.  Neurological: Positive for dizziness (resolved). Negative for tremors, seizures, syncope, facial asymmetry, speech difficulty, weakness, light-headedness, numbness and headaches.  Hematological: Negative for adenopathy. Does not bruise/bleed easily.  Psychiatric/Behavioral:  Negative for suicidal ideas,  hallucinations, behavioral problems, confusion, sleep disturbance, self-injury, dysphoric mood, decreased concentration and agitation. The patient is not nervous/anxious and is not hyperactive.    Past Medical History  Diagnosis Date  . Unstable angina 01/02/2012  . Cardiovascular stress test abnormal 01/02/2012  . Type II diabetes mellitus   . S/P CABG x 5 01/05/2012    LIMA to LAD, SVG to D2, SVG to OM, sequential SVG to RCA and PDA, EVH via right thigh and leg  . GERD (gastroesophageal reflux disease)   . Hypertension   . Hyperlipidemia   . CAD (coronary artery disease), severe multivessel CAD surgical consult for CABG 01/02/2012    3 vessel CAD with normal EF. s/p CABG x 5: LIMA to LAD, SVG to distal RCA with sequential SVG to PDA, SVG to OM, and SVG to second diagonal  . Unspecified vitamin D deficiency   . Anemia   . Communicable disease   . Cervicalgia   . Anxiety state, unspecified   . Impotence of organic origin   . Internal hemorrhoids without mention of complication   . Personal history of colonic polyps   . Diaphragmatic hernia without mention of obstruction or gangrene   . Depression   . OSA on CPAP 07/2013    Sleep study 07/2013 +sever OSA; CPAP at 10cm recommended.  . Anemia   . Kidney stone   . Diverticulosis    Past Surgical History  Procedure Laterality Date  . Rotator cuff curgery  2011    lt rotator cuff  . Cardiac catheterization  4 /19/ 2013    Joseph Terry   . Coronary artery bypass graft  01/05/2012    Procedure: CORONARY ARTERY BYPASS GRAFTING (CABG);  Surgeon: Joseph Alberts, MD;  Location: [place];  Service: Open Heart Surgery;  Laterality: N/A;  coronary artery bypass graft times five using left internal mammary artery and right leg greater saphenous vein harvested endoscopically   . Shoulder surgery  1997    bilateral  . Colonoscopy w/ polypectomy  09/16/2007    colon polyps.  Repeat 5 years. Joseph Terry.  . Polysomnography  07/2013    +severe OSA; CPAP  at 10cm pressure recommended.  . Cholecystectomy  11/03/13  . Colonoscopy  11/01/13    multiple polyps; diverticulosis. Joseph Terry. Repeat 5 years.   Allergies  Allergen Reactions  . Penicillins Itching    tingling  . Tramadol Rash   Current Outpatient Prescriptions  Medication Sig Dispense Refill  . aspirin 325 MG tablet Take 325 mg by mouth daily.      Marland Kitchen atorvastatin (LIPITOR) 40 MG tablet Take 1 tablet (40 mg total) by mouth daily at 6 PM.  30 tablet  11  . clonazePAM (KLONOPIN) 1 MG tablet Take 1 tablet (1 mg total) by mouth 2 (two) times daily.  60 tablet  5  . glipiZIDE (GLUCOTROL XL) 10 MG 24 hr tablet Take 1 tablet (10 mg total) by mouth daily with supper.  30 tablet  11  . metFORMIN (GLUCOPHAGE) 1000 MG tablet Take 1 tablet (1,000 mg total) by mouth 2 (two) times daily with a meal.  60 tablet  11  . metoprolol tartrate (LOPRESSOR) 25 MG tablet Take 1 tablet (25 mg total) by mouth 2 (two) times daily.  60 tablet  11  . venlafaxine XR (EFFEXOR-XR) 150 MG 24 hr capsule Take 1 capsule (150 mg total) by mouth daily with breakfast.  30 capsule  11  . esomeprazole (NEXIUM) 40  MG packet Take 40 mg by mouth daily before breakfast.  30 each  12  . Omega-3 Fatty Acids (FISH OIL) 1200 MG CAPS Take by mouth 2 (two) times daily.       No current facility-administered medications for this visit.   History   Social History  . Marital Status: Divorced    Spouse Name: N/A    Number of Children: 1  . Years of Education: N/A   Occupational History  . [employer]     x 30 yrs   Social History Main Topics  . Smoking status: Former Smoker -- 1.50 packs/day for 35 years    Types: Cigarettes    Quit date: 11/14/2011  . Smokeless tobacco: Not on file     Comment: quit 2 weeks prior to CABG 12/2011  . Alcohol Use: 0.6 oz/week    1 Cans of beer per week     Comment: Drinks once per month; beer.  . Drug Use: No  . Sexual Activity: Yes    Birth Control/ Protection: Condom   Other Topics  Concern  . Not on file   Social History Narrative   Marital status: divorced since 1996; dating casually in 2014.      Lives: Lives alone.          Children:  One son and a granddaughter 41 years old live in [place].       Tobacco: quit with CABG      Alcohol: weekends; 6-8 beers.      Exercise: Light, yard work; playing with son's puppy formal        Employment:  Works 45-50 hrs. Some weeks.  [employer].  Desk work since 05/2013.      Guns:  Guns in the home not stored in locked cabinet. Smoke alarm in home, Always wears seatbelts.       Sexual History: 84; Last HIV testing 1996 with divorce; has has 55 partners since divorce.             Family History  Problem Relation Age of Onset  . Heart attack Mother     defribillator in place  . Hypertension Mother   . Hyperlipidemia Mother   . Heart disease Mother     CHF with defibrillator;  CAD.  . Diabetes Mother   . Cancer Mother 77    Lung cancer  . Hypertension Father   . Hyperlipidemia Father   . Cancer Father 11     prostate cancer s/p  implants  . COPD Father   . Hypertension Sister   . Heart disease Maternal Grandmother   . Heart disease Maternal Grandfather   . Cancer Paternal Grandmother   . Cancer Paternal Grandfather   . Hypertension Sister        Objective:    Triage Vitals: BP 130/78  Pulse 70  Temp(Src) 97.6 F (36.4 C) (Oral)  Resp 16  Ht 5\' 10"  (1.778 m)  Wt 182 lb (82.555 kg)  BMI 26.11 kg/m2  SpO2 97% Physical Exam  Nursing note and vitals reviewed. Constitutional: He is oriented to person, place, and time. He appears well-developed and well-nourished. No distress.  HENT:  Head: Normocephalic and atraumatic.  Right Ear: External ear normal.  Left Ear: External ear normal.  Nose: Nose normal.  Mouth/Throat: Oropharynx is clear and moist.  Cerumen B ears.  Eyes: Conjunctivae and EOM are normal. Pupils are equal, round, and reactive to light.  Neck: Normal range of motion. Neck supple.  Carotid bruit is not present. No thyromegaly present.  Cardiovascular: Normal rate, regular rhythm, normal heart sounds and intact distal pulses.  Exam reveals no gallop and no friction rub.   No murmur heard. Pulmonary/Chest: Effort normal and breath sounds normal. He has no wheezes. He has no rales.  Abdominal: Soft. Bowel sounds are normal. He exhibits no distension and no mass. There is no tenderness. There is no rebound and no guarding. Hernia confirmed negative in the right inguinal area and confirmed negative in the left inguinal area.  Genitourinary: Rectum normal, prostate normal, testes normal and penis normal. Prostate is not enlarged and not tender. Right testis shows no mass, no swelling and no tenderness. Left testis shows no mass, no swelling and no tenderness. Circumcised.  Musculoskeletal: Normal range of motion.       Right shoulder: Normal.       Left shoulder: Normal.       Cervical back: Normal.  Lymphadenopathy:    He has no cervical adenopathy.       Right: No inguinal adenopathy present.       Left: No inguinal adenopathy present.  Neurological: He is alert and oriented to person, place, and time. He has normal reflexes. No cranial nerve deficit. He exhibits normal muscle tone. Coordination normal.  Skin: Skin is warm and dry. No rash noted. He is not diaphoretic.  Diffuse sun related changes extremities and torso.  Psychiatric: He has a normal mood and affect. His behavior is normal. Judgment and thought content normal.   Results for orders placed in visit on 05/15/14  POCT URINALYSIS DIPSTICK      Result Value Ref Range   Color, UA tellow     Clarity, UA clear     Glucose, UA 500     Bilirubin, UA small     Ketones, UA trace     Spec Grav, UA 1.020     Blood, UA neg     pH, UA 5.0     Protein, UA neg     Urobilinogen, UA 0.2     Nitrite, UA neg     Leukocytes, UA Negative     EAR IRRIGATION PERFORMED B BY CYNTHIA JOYCE, CMA.    Assessment & Plan:   1.  Screening for prostate cancer   2. Routine general medical examination at a health care facility   3. Type II or unspecified type diabetes mellitus without mention of complication, not stated as uncontrolled   4. Pure hypercholesterolemia   5. Depression   6. Problems related to high-risk sexual behavior   7. OSA on CPAP   8. Gastroesophageal reflux disease without esophagitis   9. Coronary artery disease involving coronary bypass graft of native heart with unspecified angina pectoris   10. Cerumen impaction, bilateral    1. Complete Physical Examination:  Anticipatory guidance provided --- exercise.  Colonoscopy UTD.  Immunizations reviewed; pt declines flu vaccine and Hepatitis B series.  Obtain PSA and performed DRE. 2.  Screening prostate cancer:  DRE performed; obtain PSA. 3.  DMII: uncontrolled; tolerating Glucotrol XL 10mg  daily; rare hypoglycemia.  No changes to management; obtain labs including urine microalbumin.   4 . Dyslipidemia: controlled; non-compliant with fish oil; review triglyceride level.  Goal LDL < 70 due to CAD. 5. Depression with anxiety: controlled; refill of Effexor and Klonopin provided. 6. High risk sexual behavior: pt agreeable to RPR and HIV; obtain uriprobe at next visit. 7. CAD: asymptomatic; recent follow-up with cardiology in 03/2014.  8. GERD:  Well controlled.  Refill provided. 9.  Cerumen Impaction: New. S/p B ear irrigation.  Meds ordered this encounter  Medications  . venlafaxine XR (EFFEXOR-XR) 150 MG 24 hr capsule    Sig: Take 1 capsule (150 mg total) by mouth daily with breakfast.    Dispense:  30 capsule    Refill:  11  . metoprolol tartrate (LOPRESSOR) 25 MG tablet    Sig: Take 1 tablet (25 mg total) by mouth 2 (two) times daily.    Dispense:  60 tablet    Refill:  11  . metFORMIN (GLUCOPHAGE) 1000 MG tablet    Sig: Take 1 tablet (1,000 mg total) by mouth 2 (two) times daily with a meal.    Dispense:  60 tablet    Refill:  11  . glipiZIDE  (GLUCOTROL XL) 10 MG 24 hr tablet    Sig: Take 1 tablet (10 mg total) by mouth daily with supper.    Dispense:  30 tablet    Refill:  11  . atorvastatin (LIPITOR) 40 MG tablet    Sig: Take 1 tablet (40 mg total) by mouth daily at 6 PM.    Dispense:  30 tablet    Refill:  11  . esomeprazole (NEXIUM) 40 MG packet    Sig: Take 40 mg by mouth daily before breakfast.    Dispense:  30 each    Refill:  12  . clonazePAM (KLONOPIN) 1 MG tablet    Sig: Take 1 tablet (1 mg total) by mouth 2 (two) times daily.    Dispense:  60 tablet    Refill:  5    Return in about 3 months (around 08/14/2014) for recheck diabetes, high cholesterol, anxiety.  I personally performed the services described in this documentation, which was scribed in my presence.  The recorded information has been reviewed and is accurate.  Reginia Forts, M.D.  [facility] [address] [place],   [address] [phone] phone [phone] fax

## 2014-05-15 NOTE — Patient Instructions (Signed)

## 2014-05-16 LAB — PSA: PSA: 0.5 ng/mL (ref <=4.00)

## 2014-05-19 MED ORDER — SITAGLIPTIN PHOSPHATE 100 MG PO TABS: 100.0000 mg | ORAL_TABLET | Freq: Every day | ORAL | Status: DC

## 2014-05-19 NOTE — Addendum Note (Signed)
Addended by: [name] on: 05/19/2014 10:42 AM   Modules accepted: Orders

## 2014-06-09 ENCOUNTER — Other Ambulatory Visit: Payer: Self-pay | Admitting: Physician Assistant

## 2014-08-14 ENCOUNTER — Ambulatory Visit (INDEPENDENT_AMBULATORY_CARE_PROVIDER_SITE_OTHER): Payer: BC Managed Care – PPO | Admitting: Family Medicine

## 2014-08-14 ENCOUNTER — Encounter: Payer: Self-pay | Admitting: Family Medicine

## 2014-08-14 VITALS — BP 130/84 | HR 76 | Temp 97.5°F | Resp 18 | Wt 180.0 lb

## 2014-08-14 DIAGNOSIS — Z23 Encounter for immunization: Secondary | ICD-10-CM

## 2014-08-14 DIAGNOSIS — IMO0001 Reserved for inherently not codable concepts without codable children: Secondary | ICD-10-CM

## 2014-08-14 DIAGNOSIS — E1165 Type 2 diabetes mellitus with hyperglycemia: Secondary | ICD-10-CM

## 2014-08-14 DIAGNOSIS — E119 Type 2 diabetes mellitus without complications: Secondary | ICD-10-CM

## 2014-08-14 DIAGNOSIS — E785 Hyperlipidemia, unspecified: Secondary | ICD-10-CM

## 2014-08-14 DIAGNOSIS — Z87891 Personal history of nicotine dependence: Secondary | ICD-10-CM

## 2014-08-14 DIAGNOSIS — I10 Essential (primary) hypertension: Secondary | ICD-10-CM

## 2014-08-14 DIAGNOSIS — I25709 Atherosclerosis of coronary artery bypass graft(s), unspecified, with unspecified angina pectoris: Secondary | ICD-10-CM

## 2014-08-14 LAB — CBC WITH DIFFERENTIAL/PLATELET
BASOS ABS: 0.1 10*3/uL (ref 0.0–0.1)
Basophils Relative: 1 % (ref 0–1)
Eosinophils Absolute: 1 10*3/uL — ABNORMAL HIGH (ref 0.0–0.7)
Eosinophils Relative: 13 % — ABNORMAL HIGH (ref 0–5)
HEMATOCRIT: 41.9 % (ref 39.0–52.0)
HEMOGLOBIN: 14 g/dL (ref 13.0–17.0)
LYMPHS PCT: 23 % (ref 12–46)
Lymphs Abs: 1.7 10*3/uL (ref 0.7–4.0)
MCH: 26.8 pg (ref 26.0–34.0)
MCHC: 33.4 g/dL (ref 30.0–36.0)
MCV: 80.1 fL (ref 78.0–100.0)
MONO ABS: 0.5 10*3/uL (ref 0.1–1.0)
MONOS PCT: 6 % (ref 3–12)
MPV: 9.6 fL (ref 9.4–12.4)
Neutro Abs: 4.3 10*3/uL (ref 1.7–7.7)
Neutrophils Relative %: 57 % (ref 43–77)
Platelets: 335 10*3/uL (ref 150–400)
RBC: 5.23 MIL/uL (ref 4.22–5.81)
RDW: 15.2 % (ref 11.5–15.5)
WBC: 7.6 10*3/uL (ref 4.0–10.5)

## 2014-08-14 LAB — HEMOGLOBIN A1C
HEMOGLOBIN A1C: 10.5 % — AB (ref <5.7)
Mean Plasma Glucose: 255 mg/dL — ABNORMAL HIGH (ref <117)

## 2014-08-14 LAB — COMPLETE METABOLIC PANEL WITH GFR
ALT: 64 U/L — ABNORMAL HIGH (ref 0–53)
AST: 36 U/L (ref 0–37)
Albumin: 4.9 g/dL (ref 3.5–5.2)
Alkaline Phosphatase: 92 U/L (ref 39–117)
BUN: 14 mg/dL (ref 6–23)
CALCIUM: 10 mg/dL (ref 8.4–10.5)
CO2: 24 meq/L (ref 19–32)
Chloride: 97 mEq/L (ref 96–112)
Creat: 0.66 mg/dL (ref 0.50–1.35)
GFR, Est Non African American: >89 mL/min
GLUCOSE: 184 mg/dL — AB (ref 70–99)
POTASSIUM: 4.5 meq/L (ref 3.5–5.3)
Sodium: 135 mEq/L (ref 135–145)
TOTAL PROTEIN: 7.6 g/dL (ref 6.0–8.3)
Total Bilirubin: 0.5 mg/dL (ref 0.2–1.2)

## 2014-08-14 LAB — LIPID PANEL
Cholesterol: 136 mg/dL (ref 0–200)
HDL: 29 mg/dL — ABNORMAL LOW (ref >39)
Total CHOL/HDL Ratio: 4.7 Ratio
Triglycerides: 736 mg/dL — ABNORMAL HIGH (ref <150)

## 2014-08-14 MED ORDER — GLIPIZIDE ER 10 MG PO TB24: 10.0000 mg | ORAL_TABLET | Freq: Every day | ORAL | Status: DC

## 2014-08-14 MED ORDER — LISINOPRIL 5 MG PO TABS: 5.0000 mg | ORAL_TABLET | Freq: Every day | ORAL | Status: DC

## 2014-08-14 NOTE — Progress Notes (Signed)
Subjective:    Patient ID: Joseph Terry, male    DOB: [birth year], 53 y.o.   MRN: [id number] Patient Active Problem List   Diagnosis Date Noted  . Anemia 12/26/2013  . Encounter for screening colonoscopy 12/05/2013  . Barrett's esophagus 12/01/2013  . Personal history of colonic polyps 10/19/2013  . OSA on CPAP 09/20/2013  . Routine general medical examination at a health care facility 02/28/2013  . GERD (gastroesophageal reflux disease) 02/28/2013  . Other malaise and fatigue 02/28/2013  . Coronary artery disease 08/30/2012  . Depression with anxiety 05/31/2012  . Hypertension   . Hyperlipidemia   . Dyslipidemia 01/06/2012  . Type II or unspecified type diabetes mellitus without mention of complication, not stated as uncontrolled 01/06/2012  . Smoking history, quit 2weeks before admssion 01/06/2012  . S/P CABG x 5 01/05/2012  . Unstable angina 01/02/2012  . Cardiovascular stress test abnormal 01/02/2012  . CAD at cath, Nl LVF, severe multivessel CAD surgical consult for CABG 01/02/2012   Prior to Admission medications   Medication Sig Start Date End Date Taking? Authorizing Provider  aspirin 325 MG tablet Take 325 mg by mouth daily.   Yes Historical Provider, MD  atorvastatin (LIPITOR) 40 MG tablet Take 1 tablet (40 mg total) by mouth daily at 6 PM. 05/15/14  Yes [name], MD  clonazePAM (KLONOPIN) 1 MG tablet Take 1 tablet (1 mg total) by mouth 2 (two) times daily. 05/15/14  Yes [name], MD  esomeprazole (NEXIUM) 40 MG packet Take 40 mg by mouth daily before breakfast. 05/15/14  Yes [name], MD  glipiZIDE (GLUCOTROL XL) 5 MG 24 hr tablet TAKE 1 TABLET BY MOUTH DAILY. TAKE WITH LARGEST MEAL OF THE DAY. 06/09/14  Yes Mancel Bale, PA-C  metFORMIN (GLUCOPHAGE) 1000 MG tablet Take 1 tablet (1,000 mg total) by mouth 2 (two) times daily with a meal. 05/15/14  Yes [name], MD  metoprolol tartrate (LOPRESSOR) 25 MG tablet Take 1 tablet (25 mg total) by mouth 2  (two) times daily. 05/15/14  Yes [name], MD  Omega-3 Fatty Acids (FISH OIL) 1200 MG CAPS Take by mouth 2 (two) times daily.   Yes Historical Provider, MD  sitaGLIPtin (JANUVIA) 100 MG tablet Take 1 tablet (100 mg total) by mouth daily. 05/19/14  Yes [name], MD  venlafaxine XR (EFFEXOR-XR) 150 MG 24 hr capsule Take 1 capsule (150 mg total) by mouth daily with breakfast. 05/15/14  Yes [name], MD   Allergies  Allergen Reactions  . Penicillins Itching    tingling  . Tramadol Rash   HPI  This is a 53 year old male presenting for 3 month check of T2 DM.  DM: last hgbA1C 05/15/14 - 10.1. He is on glucotrol 10 mg daily and metformin 1000 mg BID. Januvia 100 mg was added at last visit. He reports he is feeling ok, no concerns. He has not had any hypoglycemic episodes. He does not check his BG. He states he is trying to eat healthier - he states he is trying to stay away from bread, red meat and fried foods. He states he was drinking 4-6 12 oz mountain dew cans a day but has recently switched to coke zero. He reports he slipped up on thanksgiving and "hit the mountain dew hard". He last had his eyes checked in May 2015 and normal. He reports he checks his feet regularly. He denies paresthesias. He does not exercise.  HLD: on atorvastatin 40 mg. He is fasting today. At last visit LDL was unable to be calculated d/t not fasting.  HTN: on metoprolol 25 mg - all he has ever been on for HTN. He does not check BP at home.   CAD: s/p CABG x 5 in 12/2011. Followed by cardiology, last seen August 2015.  No changes made.  Review of Systems  Constitutional: Negative for fever and chills.  Eyes: Negative for visual disturbance.  Respiratory: Negative for shortness of breath.   Cardiovascular: Positive for chest pain (rare, followed by cardiology). Negative for palpitations and leg swelling.  Gastrointestinal: Negative for nausea, vomiting, abdominal pain and diarrhea.  Endocrine: Negative  for polydipsia, polyphagia and polyuria.  Skin: Negative for rash and wound.  Neurological: Negative for dizziness and numbness.      Objective:   Physical Exam  Constitutional: He is oriented to person, place, and time. He appears well-developed and well-nourished. No distress.  HENT:  Head: Normocephalic and atraumatic.  Right Ear: Hearing normal.  Left Ear: Hearing normal.  Nose: Nose normal.  Eyes: Conjunctivae and lids are normal. Right eye exhibits no discharge. Left eye exhibits no discharge. No scleral icterus.  Cardiovascular: Normal rate, regular rhythm, normal heart sounds, intact distal pulses and normal pulses.   No murmur heard. Pulmonary/Chest: Effort normal and breath sounds normal. No respiratory distress. He has no wheezes. He has no rhonchi. He has no rales.  Musculoskeletal: Normal range of motion.  Neurological: He is alert and oriented to person, place, and time.  Skin: Skin is warm, dry and intact. No lesion and no rash noted.  See diabetic foot exam  Psychiatric: He has a normal mood and affect. His speech is normal and behavior is normal. Thought content normal.   BP 130/84 mmHg  Pulse 76  Temp(Src) 97.5 F (36.4 C)  Resp 18  Wt 180 lb (81.647 kg)  SpO2 97%     Assessment & Plan:  1. Diabetes type 2, controlled Las hgb A1C 10.1 on 05/15/14. Januvia 100 mg QD was added to his current regimen of glucotrol 10 mg QD and Metformin 1000 mg BID. Encouraged pt to continue eating healthier and avoid non-diet sodas. Encouraged regular walking for exercise. Will hold off on changes to his medication regimen pending his hgbA1c.  - HM Diabetes Foot Exam - Hemoglobin A1c  2. Essential hypertension Stable on metoprolol 25 mg. Pt has never been on an ACE. Will add lisinopril 5 mg for renal protection.  - CBC with Differential - COMPLETE METABOLIC PANEL WITH GFR - Lipid panel  3. Hyperlipidemia Pt fasting today. He is on atorvastatin 40 mg. No changes today pending  lipid panel. - Lipid panel  4. Need for prophylactic vaccination against Streptococcus pneumoniae (pneumococcus) - Pneumococcal conjugate vaccine 13-valent IM  5. Coronary artery disease involving coronary bypass graft of native heart with unspecified angina pectoris Follow up with cardiology.   Benjaman Pott Drenda Freeze, MHS [facility]  08/14/2014

## 2014-08-14 NOTE — Patient Instructions (Signed)
1.  AVOID REGULAR SODAS; OK TO DRINK COKE ZERO.

## 2014-08-14 NOTE — Progress Notes (Signed)
History and physical examinations obtained with Bennett Scrape, PA-C.  Patient reporting that doing well emotionally. Denies excessive anxiety, worry, insomnia.  Has adjusted to new position at work.  Has made many dietary changes recently due to elevated HgbA1c at last visit. Does not check sugars and never has.  O:  CV: RRR  Lungs:CTA B; Ext: no c/c/e.  Skin: no callus formation.  Neuro: intact.  Psych: normal; no anxiety.  A/P: DMII: uncontrolled; obtain labs; continue Januvia, Glucotrol, Metformin. Eye exam UTD.  HTN: controlled; add Lisinopril. CAD: stable; add Lisinopril.  Hyperlipidemia: moderately controlled; obtain labs.  Anxiety: stable; continue Effexor and Klonopin. S/p Prevnar 13; refuses flu vaccine. Follow-up in three months.

## 2014-08-22 ENCOUNTER — Other Ambulatory Visit: Payer: Self-pay | Admitting: Family Medicine

## 2014-08-22 MED ORDER — CANAGLIFLOZIN 100 MG PO TABS: 100.0000 mg | ORAL_TABLET | Freq: Every day | ORAL | Status: DC

## 2014-08-22 NOTE — Addendum Note (Signed)
Addended by: [name] on: 08/22/2014 10:23 AM   Modules accepted: Orders

## 2014-08-24 ENCOUNTER — Encounter (HOSPITAL_COMMUNITY): Payer: Self-pay | Admitting: Cardiovascular Disease

## 2014-09-28 ENCOUNTER — Telehealth: Payer: Self-pay

## 2014-09-28 NOTE — Telephone Encounter (Signed)
PA was needed for esomeprazole, but when completing form discovered that Name brand Nexium is preferred. Contacted pharm and asked them to run for Nexium and it did go through.

## 2014-10-16 ENCOUNTER — Telehealth: Payer: Self-pay

## 2014-10-16 NOTE — Telephone Encounter (Signed)
Pt states the JANUVIA 100MG S AND INVOKANA 100MG S cost over $100.00 a piece and wanted to know if he could get a generic or do we have coupons for them Please call [phone]    [facility] [address]

## 2014-10-18 NOTE — Telephone Encounter (Signed)
I found coupons for pt, but he reported that he was able to get coupons from pharm and online and he no longer needs anything. Thanked me for my help.

## 2014-10-23 ENCOUNTER — Other Ambulatory Visit: Payer: Self-pay | Admitting: Family Medicine

## 2014-11-18 ENCOUNTER — Other Ambulatory Visit: Payer: Self-pay | Admitting: Family Medicine

## 2014-11-20 ENCOUNTER — Ambulatory Visit (INDEPENDENT_AMBULATORY_CARE_PROVIDER_SITE_OTHER): Payer: BLUE CROSS/BLUE SHIELD | Admitting: Family Medicine

## 2014-11-20 ENCOUNTER — Encounter: Payer: Self-pay | Admitting: Family Medicine

## 2014-11-20 VITALS — BP 98/60 | HR 60 | Temp 98.2°F | Resp 16 | Ht 71.0 in | Wt 178.0 lb

## 2014-11-20 DIAGNOSIS — E119 Type 2 diabetes mellitus without complications: Secondary | ICD-10-CM

## 2014-11-20 DIAGNOSIS — I25709 Atherosclerosis of coronary artery bypass graft(s), unspecified, with unspecified angina pectoris: Secondary | ICD-10-CM | POA: Diagnosis not present

## 2014-11-20 DIAGNOSIS — K219 Gastro-esophageal reflux disease without esophagitis: Secondary | ICD-10-CM

## 2014-11-20 DIAGNOSIS — F418 Other specified anxiety disorders: Secondary | ICD-10-CM

## 2014-11-20 DIAGNOSIS — E785 Hyperlipidemia, unspecified: Secondary | ICD-10-CM | POA: Diagnosis not present

## 2014-11-20 DIAGNOSIS — I10 Essential (primary) hypertension: Secondary | ICD-10-CM | POA: Diagnosis not present

## 2014-11-20 NOTE — Progress Notes (Signed)
Subjective:    Patient ID: Joseph Terry, male    DOB: 08-16-1961, 54 y.o.   MRN: [id number]  11/20/2014  Diabetes   HPI This 55 y.o. male presents for three month follow-up:  1.  DMII:  Management changes made at last visit include adding Invokana $RemoveBeforeD'100mg'vPjfWiYckyyPXW$  daily for worsening HgbA1c of 10.1.  Patient reports good compliance with medication, good tolerance to medication, and good symptom control.  Side effect of increased nocturia x 2-3 since starting medication.  Does not check blood sugars; refuses flu vaccine.   2.  HTN: management changes made at last visit included adding Lisinopril $RemoveBeforeDEI'5mg'iSFsKpPWUQCIwsMs$  daily. Does not check BP at home. Patient reports good compliance with medication, good tolerance to medication, and good symptom control.  Denies CP/palp/SOB/leg swelling.  Denies dizziness; concerned with low BP today.  3.  Dyslipidemia:  No management changes made at last visit.  Triglycerides of 700+ at last visit.  Has cut out [place]. Dew and excessive candy intake.  Patient reports good compliance with medication, good tolerance to medication, and good symptom control.  Denies HA/dizziness/focal weakness/paresthesias.  4.  GERD: management changes made at last visit included changing Nexium to name brand per insurance protocol. Pt reports that insurance going to changes brand name Nexium to Tier 4 soon.  Denies n/v/d/c; denies bloody stools or melena.  Denies abdominal pain or decreased appetite.  5.  Anxiety and depression:  Emotionally stable currently.  Patient reports good compliance with medication, good tolerance to medication, and good symptom control.  Doing well.  No concerns.  Short-term irritability on a daily basis.    6. CAD: doing well; asymptomatic; follow-up with cardiology/[facility] next week.  Continues to suffer with sternal tenderness/pain.     Review of Systems  Constitutional: Negative for fever, chills, diaphoresis, activity change, appetite change and fatigue.  HENT: Negative for  congestion, ear pain and sore throat.   Eyes: Negative for visual disturbance.  Respiratory: Negative for cough and shortness of breath.   Cardiovascular: Negative for chest pain, palpitations and leg swelling.  Gastrointestinal: Negative for nausea, vomiting, abdominal pain, diarrhea, constipation, blood in stool and anal bleeding.  Endocrine: Negative for cold intolerance, heat intolerance, polydipsia, polyphagia and polyuria.  Skin: Negative for rash.  Neurological: Negative for dizziness, tremors, seizures, syncope, facial asymmetry, speech difficulty, weakness, light-headedness, numbness and headaches.  Psychiatric/Behavioral: Negative for suicidal ideas, sleep disturbance, self-injury and dysphoric mood. The patient is not nervous/anxious.     Past Medical History  Diagnosis Date  . Unstable angina 01/02/2012  . Cardiovascular stress test abnormal 01/02/2012  . Type II diabetes mellitus   . S/P CABG x 5 01/05/2012    LIMA to LAD, SVG to D2, SVG to OM, sequential SVG to RCA and PDA, EVH via right thigh and leg  . GERD (gastroesophageal reflux disease)   . Hypertension   . Hyperlipidemia   . CAD (coronary artery disease), severe multivessel CAD surgical consult for CABG 01/02/2012    3 vessel CAD with normal EF. s/p CABG x 5: LIMA to LAD, SVG to distal RCA with sequential SVG to PDA, SVG to OM, and SVG to second diagonal  . Unspecified vitamin D deficiency   . Anemia   . Communicable disease   . Cervicalgia   . Anxiety state, unspecified   . Impotence of organic origin   . Internal hemorrhoids without mention of complication   . Personal history of colonic polyps   . Diaphragmatic hernia without mention of  obstruction or gangrene   . Depression   . OSA on CPAP 07/2013    Sleep study 07/2013 +sever OSA; CPAP at 10cm recommended.  . Anemia   . Kidney stone   . Diverticulosis    Past Surgical History  Procedure Laterality Date  . Rotator cuff curgery  2011    lt rotator cuff    . Cardiac catheterization  4 /19/ 2013    [place]   . Coronary artery bypass graft  01/05/2012    Procedure: CORONARY ARTERY BYPASS GRAFTING (CABG);  Surgeon: Rexene Alberts, MD;  Location: [facility];  Service: Open Heart Surgery;  Laterality: N/A;  coronary artery bypass graft times five using left internal mammary artery and right leg greater saphenous vein harvested endoscopically   . Shoulder surgery  1997    bilateral  . Colonoscopy w/ polypectomy  09/16/2007    colon polyps.  Repeat 5 years. I[facility]har.  . Polysomnography  07/2013    +severe OSA; CPAP at 10cm pressure recommended.  . Cholecystectomy  11/03/13  . Colonoscopy  11/01/13    multiple polyps; diverticulosis. S[facility]. Repeat 5 years.  . Left heart catheterization with coronary angiogram N/A 01/02/2012    Procedure: LEFT HEART CATHETERIZATION WITH CORONARY ANGIOGRAM;  Surgeon: Troy Sine, MD;  Location: [facility] CATH LAB;  Service: Cardiovascular;  Laterality: N/A;   Allergies  Allergen Reactions  . Penicillins Itching    tingling  . Tramadol Rash   Current Outpatient Prescriptions  Medication Sig Dispense Refill  . aspirin 325 MG tablet Take 325 mg by mouth daily.    Marland Kitchen atorvastatin (LIPITOR) 40 MG tablet Take 1 tablet (40 mg total) by mouth daily at 6 PM. 30 tablet 11  . canagliflozin (INVOKANA) 100 MG TABS tablet TAKE 1 TABLET (100 MG TOTAL) BY MOUTH DAILY. 30 tablet 0  . clonazePAM (KLONOPIN) 1 MG tablet Take 1 tablet (1 mg total) by mouth 2 (two) times daily. 60 tablet 5  . esomeprazole (NEXIUM) 40 MG capsule TAKE ONE CAPSULE BY MOUTH EVERY DAY. 30 capsule 1  . glipiZIDE (GLUCOTROL XL) 10 MG 24 hr tablet Take 1 tablet (10 mg total) by mouth daily with breakfast. 30 tablet 11  . lisinopril (PRINIVIL,ZESTRIL) 5 MG tablet Take 1 tablet (5 mg total) by mouth daily. 30 tablet 11  . metFORMIN (GLUCOPHAGE) 1000 MG tablet Take 1 tablet (1,000 mg total) by mouth 2 (two) times daily with a meal. 60 tablet 11  . metoprolol tartrate  (LOPRESSOR) 25 MG tablet Take 1 tablet (25 mg total) by mouth 2 (two) times daily. 60 tablet 11  . Omega-3 Fatty Acids (FISH OIL) 1200 MG CAPS Take by mouth 2 (two) times daily.    . sitaGLIPtin (JANUVIA) 100 MG tablet Take 1 tablet (100 mg total) by mouth daily. 30 tablet 11  . venlafaxine XR (EFFEXOR-XR) 150 MG 24 hr capsule Take 1 capsule (150 mg total) by mouth daily with breakfast. 30 capsule 11   No current facility-administered medications for this visit.       Objective:    BP 98/60 mmHg  Pulse 60  Temp(Src) 98.2 F (36.8 C) (Oral)  Resp 16  Ht $R'5\' 11"'JW$  (1.803 m)  Wt 178 lb (80.74 kg)  BMI 24.84 kg/m2  SpO2 98% Physical Exam  Constitutional: He is oriented to person, place, and time. He appears well-developed and well-nourished. No distress.  HENT:  Head: Normocephalic and atraumatic.  Right Ear: External ear normal.  Left Ear: External ear  normal.  Nose: Nose normal.  Mouth/Throat: Oropharynx is clear and moist.  Eyes: Conjunctivae and EOM are normal. Pupils are equal, round, and reactive to light.  Neck: Normal range of motion. Neck supple. Carotid bruit is not present. No thyromegaly present.  Cardiovascular: Normal rate, regular rhythm, normal heart sounds and intact distal pulses.  Exam reveals no gallop and no friction rub.   No murmur heard. Pulmonary/Chest: Effort normal and breath sounds normal. He has no wheezes. He has no rales.  Abdominal: Soft. Bowel sounds are normal. He exhibits no distension and no mass. There is no tenderness. There is no rebound and no guarding.  Lymphadenopathy:    He has no cervical adenopathy.  Neurological: He is alert and oriented to person, place, and time. No cranial nerve deficit.  Skin: Skin is warm and dry. No rash noted. He is not diaphoretic.  Psychiatric: He has a normal mood and affect. His behavior is normal.  Nursing note and vitals reviewed.  Results for orders placed or performed in visit on 08/14/14  CBC with  Differential  Result Value Ref Range   WBC 7.6 4.0 - 10.5 K/uL   RBC 5.23 4.22 - 5.81 MIL/uL   Hemoglobin 14.0 13.0 - 17.0 g/dL   HCT 41.9 39.0 - 52.0 %   MCV 80.1 78.0 - 100.0 fL   MCH 26.8 26.0 - 34.0 pg   MCHC 33.4 30.0 - 36.0 g/dL   RDW 15.2 11.5 - 15.5 %   Platelets 335 150 - 400 K/uL   Neutrophils Relative % 57 43 - 77 %   Neutro Abs 4.3 1.7 - 7.7 K/uL   Lymphocytes Relative 23 12 - 46 %   Lymphs Abs 1.7 0.7 - 4.0 K/uL   Monocytes Relative 6 3 - 12 %   Monocytes Absolute 0.5 0.1 - 1.0 K/uL   Eosinophils Relative 13 (H) 0 - 5 %   Eosinophils Absolute 1.0 (H) 0.0 - 0.7 K/uL   Basophils Relative 1 0 - 1 %   Basophils Absolute 0.1 0.0 - 0.1 K/uL   Smear Review Criteria for review not met    MPV 9.6 9.4 - 12.4 fL  COMPLETE METABOLIC PANEL WITH GFR  Result Value Ref Range   Sodium 135 135 - 145 mEq/L   Potassium 4.5 3.5 - 5.3 mEq/L   Chloride 97 96 - 112 mEq/L   CO2 24 19 - 32 mEq/L   Glucose, Bld 184 (H) 70 - 99 mg/dL   BUN 14 6 - 23 mg/dL   Creat 0.66 0.50 - 1.35 mg/dL   Total Bilirubin 0.5 0.2 - 1.2 mg/dL   Alkaline Phosphatase 92 39 - 117 U/L   AST 36 0 - 37 U/L   ALT 64 (H) 0 - 53 U/L   Total Protein 7.6 6.0 - 8.3 g/dL   Albumin 4.9 3.5 - 5.2 g/dL   Calcium 10.0 8.4 - 10.5 mg/dL   GFR, Est African American >89 mL/min   GFR, Est Non African American >89 mL/min  Hemoglobin A1c  Result Value Ref Range   Hgb A1c MFr Bld 10.5 (H) <5.7 %   Mean Plasma Glucose 255 (H) <117 mg/dL  Lipid panel  Result Value Ref Range   Cholesterol 136 0 - 200 mg/dL   Triglycerides 736 (H) <150 mg/dL   HDL 29 (L) >39 mg/dL   Total CHOL/HDL Ratio 4.7 Ratio   VLDL NOT CALC 0 - 40 mg/dL   LDL Cholesterol NOT CALC 0 -  99 mg/dL       Assessment & Plan:   1. Type 2 diabetes mellitus without complication   2. Dyslipidemia   3. Essential hypertension   4. Hyperlipidemia   5. Gastroesophageal reflux disease without esophagitis   6. Depression with anxiety   7. Coronary artery disease  involving coronary bypass graft of native heart with unspecified angina pectoris     1. DMII: uncontrolled; tolerating Invokana 100mg  daily; has made significant dietary modifications since last visit.  Obtain labs.   2.  Dyslipidemia: uncontrolled with elevated triglycerides at last visit; repeat today; continue current medications; has made significant dietary modifications since last visit.  3.  HTN: controlled with borderline low readings; recommend STOPPING Lisinopril 5mg  daily for now. Obtain labs. 4.  GERD: controlled with name brand Nexium; continue medication for now; will switch when name brand Nexium switches to Tier 4. 5.  Anxiety and depression: controlled; no changes to management at this time.   6.  CAD: asymptomatic; no changes to management; follow-up with cardiology next week.   No orders of the defined types were placed in this encounter.    Return in about 3 months (around 02/20/2015) for recheck diabetes, high cholesterol.     Elayne Guerin, M.D. [facility] [address] [place], [place]  [id number] [phone] phone [phone] fax

## 2014-11-20 NOTE — Patient Instructions (Signed)
1.  STOP LISINOPRIL 5MG  ONE TABLET DAILY.    Basic Carbohydrate Counting for Diabetes Mellitus Carbohydrate counting is a method for keeping track of the amount of carbohydrates you eat. Eating carbohydrates naturally increases the level of sugar (glucose) in your blood, so it is important for you to know the amount that is okay for you to have in every meal. Carbohydrate counting helps keep the level of glucose in your blood within normal limits. The amount of carbohydrates allowed is different for every person. A dietitian can help you calculate the amount that is right for you. Once you know the amount of carbohydrates you can have, you can count the carbohydrates in the foods you want to eat. Carbohydrates are found in the following foods:  Grains, such as breads and cereals.  Dried beans and soy products.  Starchy vegetables, such as potatoes, peas, and corn.  Fruit and fruit juices.  Milk and yogurt.  Sweets and snack foods, such as cake, cookies, candy, chips, soft drinks, and fruit drinks. CARBOHYDRATE COUNTING There are two ways to count the carbohydrates in your food. You can use either of the methods or a combination of both. Reading the "Nutrition Facts" on [place] The "Nutrition Facts" is an area that is included on the labels of almost all packaged food and beverages in the [place]. It includes the serving size of that food or beverage and information about the nutrients in each serving of the food, including the grams (g) of carbohydrate per serving.  Decide the number of servings of this food or beverage that you will be able to eat or drink. Multiply that number of servings by the number of grams of carbohydrate that is listed on the label for that serving. The total will be the amount of carbohydrates you will be having when you eat or drink this food or beverage. Learning Standard Serving Sizes of Food When you eat food that is not packaged or does not include  "Nutrition Facts" on the label, you need to measure the servings in order to count the amount of carbohydrates.A serving of most carbohydrate-rich foods contains about 15 g of carbohydrates. The following list includes serving sizes of carbohydrate-rich foods that provide 15 g ofcarbohydrate per serving:   1 slice of bread (1 oz) or 1 six-inch tortilla.    of a hamburger bun or English muffin.  4-6 crackers.   cup unsweetened dry cereal.    cup hot cereal.   cup rice or pasta.    cup mashed potatoes or  of a large baked potato.  1 cup fresh fruit or one small piece of fruit.    cup canned or frozen fruit or fruit juice.  1 cup milk.   cup plain fat-free yogurt or yogurt sweetened with artificial sweeteners.   cup cooked dried beans or starchy vegetable, such as peas, corn, or potatoes.  Decide the number of standard-size servings that you will eat. Multiply that number of servings by 15 (the grams of carbohydrates in that serving). For example, if you eat 2 cups of strawberries, you will have eaten 2 servings and 30 g of carbohydrates (2 servings x 15 g = 30 g). For foods such as soups and casseroles, in which more than one food is mixed in, you will need to count the carbohydrates in each food that is included. EXAMPLE OF CARBOHYDRATE COUNTING Sample Dinner  3 oz chicken breast.   cup of brown rice.   cup of  corn.  1 cup milk.   1 cup strawberries with sugar-free whipped topping.  Carbohydrate Calculation Step 1: Identify the foods that contain carbohydrates:   Rice.   Corn.   Milk.   Strawberries. Step 2:Calculate the number of servings eaten of each:   2 servings of rice.   1 serving of corn.   1 serving of milk.   1 serving of strawberries. Step 3: Multiply each of those number of servings by 15 g:   2 servings of rice x 15 g = 30 g.   1 serving of corn x 15 g = 15 g.   1 serving of milk x 15 g = 15 g.   1 serving of  strawberries x 15 g = 15 g. Step 4: Add together all of the amounts to find the total grams of carbohydrates eaten: 30 g + 15 g + 15 g + 15 g = 75 g. Document Released: 09/01/2005 Document Revised: 01/16/2014 Document Reviewed: 07/29/2013 [facility] Patient Information 2015 [place], [place]. This information is not intended to replace advice given to you by your health care provider. Make sure you discuss any questions you have with your health care provider.

## 2014-11-21 LAB — CBC WITH DIFFERENTIAL/PLATELET
BASOS ABS: 0.1 10*3/uL (ref 0.0–0.2)
Basos: 1 %
EOS: 6 %
Eosinophils Absolute: 0.4 10*3/uL (ref 0.0–0.4)
HEMATOCRIT: 39.9 % (ref 37.5–51.0)
Hemoglobin: 12.9 g/dL (ref 12.6–17.7)
IMMATURE GRANULOCYTES: 0 %
Immature Grans (Abs): 0 10*3/uL (ref 0.0–0.1)
LYMPHS ABS: 1.7 10*3/uL (ref 0.7–3.1)
Lymphs: 25 %
MCH: 25.5 pg — AB (ref 26.6–33.0)
MCHC: 32.3 g/dL (ref 31.5–35.7)
MCV: 79 fL (ref 79–97)
MONOCYTES: 5 %
MONOS ABS: 0.3 10*3/uL (ref 0.1–0.9)
NEUTROS ABS: 4.5 10*3/uL (ref 1.4–7.0)
Neutrophils Relative %: 63 %
PLATELETS: 370 10*3/uL (ref 150–379)
RBC: 5.05 x10E6/uL (ref 4.14–5.80)
RDW: 15.2 % (ref 12.3–15.4)
WBC: 7 10*3/uL (ref 3.4–10.8)

## 2014-11-21 LAB — COMPREHENSIVE METABOLIC PANEL
A/G RATIO: 1.8 (ref 1.1–2.5)
ALT: 30 IU/L (ref 0–44)
AST: 16 IU/L (ref 0–40)
Albumin: 4.7 g/dL (ref 3.5–5.5)
Alkaline Phosphatase: 82 IU/L (ref 39–117)
BILIRUBIN TOTAL: 0.3 mg/dL (ref 0.0–1.2)
BUN/Creatinine Ratio: 17 (ref 9–20)
BUN: 13 mg/dL (ref 6–24)
CO2: 23 mmol/L (ref 18–29)
CREATININE: 0.78 mg/dL (ref 0.76–1.27)
Calcium: 9.2 mg/dL (ref 8.7–10.2)
Chloride: 97 mmol/L (ref 97–108)
GFR, EST AFRICAN AMERICAN: 117 mL/min/{1.73_m2} (ref >59)
GFR, EST NON AFRICAN AMERICAN: 102 mL/min/{1.73_m2} (ref >59)
GLUCOSE: 107 mg/dL — AB (ref 65–99)
Globulin, Total: 2.6 g/dL (ref 1.5–4.5)
Potassium: 5 mmol/L (ref 3.5–5.2)
Sodium: 138 mmol/L (ref 134–144)
Total Protein: 7.3 g/dL (ref 6.0–8.5)

## 2014-11-21 LAB — LIPID PANEL
Chol/HDL Ratio: 3.9 ratio units (ref 0.0–5.0)
Cholesterol, Total: 137 mg/dL (ref 100–199)
HDL: 35 mg/dL — AB (ref >39)
LDL Calculated: 54 mg/dL (ref 0–99)
Triglycerides: 240 mg/dL — ABNORMAL HIGH (ref 0–149)
VLDL CHOLESTEROL CAL: 48 mg/dL — AB (ref 5–40)

## 2014-11-21 LAB — HEMOGLOBIN A1C
Est. average glucose Bld gHb Est-mCnc: 177 mg/dL
Hgb A1c MFr Bld: 7.8 % — ABNORMAL HIGH (ref 4.8–5.6)

## 2014-11-21 MED ORDER — CANAGLIFLOZIN 300 MG PO TABS: 300.0000 mg | ORAL_TABLET | Freq: Every day | ORAL | Status: DC

## 2014-11-21 NOTE — Addendum Note (Signed)
Addended by: [name] on: 11/21/2014 08:16 AM   Modules accepted: Orders

## 2014-11-27 ENCOUNTER — Ambulatory Visit (INDEPENDENT_AMBULATORY_CARE_PROVIDER_SITE_OTHER): Payer: BLUE CROSS/BLUE SHIELD | Admitting: Nurse Practitioner

## 2014-11-27 ENCOUNTER — Encounter: Payer: Self-pay | Admitting: Cardiovascular Disease

## 2014-11-27 VITALS — BP 118/68 | HR 64 | Ht 70.0 in | Wt 184.5 lb

## 2014-11-27 DIAGNOSIS — E785 Hyperlipidemia, unspecified: Secondary | ICD-10-CM

## 2014-11-27 DIAGNOSIS — I25709 Atherosclerosis of coronary artery bypass graft(s), unspecified, with unspecified angina pectoris: Secondary | ICD-10-CM

## 2014-11-27 DIAGNOSIS — I10 Essential (primary) hypertension: Secondary | ICD-10-CM

## 2014-11-27 NOTE — Patient Instructions (Signed)
Your physician has requested that you have an exercise tolerance test in July. For further information please visit [email or web address]. Please also follow instruction sheet, as given. -hold metoprolol the morning of  -take other meds  -eat a small meal  -no lotion on the skin   Your physician recommends that you schedule a follow-up appointment in:  In July with Dr. Fletcher Anon

## 2014-11-27 NOTE — Progress Notes (Signed)
Patient Name: Joseph Terry Date of Encounter: 11/27/2014  Primary Care Provider:  Reginia Forts, MD Primary Cardiologist:  Jerilynn Mages. Fletcher Anon, MD   Chief Complaint  54 y/o male with a h/o CAD s/p CABG who presents for follow-up.  Past Medical History   Past Medical History  Diagnosis Date  . Type II diabetes mellitus   . GERD (gastroesophageal reflux disease)   . Hypertension   . Hyperlipidemia   . CAD (coronary artery disease), severe multivessel CAD surgical consult for CABG     a. 12/2011 Cath: 3 vessel CAD with normal EF;  b. 12/2011 CABG x 5: LIMA to LAD, SVG to distal RCA with sequential SVG to PDA, SVG to OM, and SVG to D2;  c. 03/2014 ETT: Ex time 6:21, 60mm horizontal ST dep in V3-V6, HTN response.  Marland Kitchen Unspecified vitamin D deficiency   . Anemia   . Cervicalgia   . Anxiety state, unspecified   . Impotence of organic origin   . Internal hemorrhoids without mention of complication   . Personal history of colonic polyps   . Diaphragmatic hernia without mention of obstruction or gangrene   . Depression   . OSA on CPAP     a. Sleep study 07/2013 +sever OSA; CPAP at 10cm recommended.  . Anemia   . Kidney stone   . Diverticulosis    Past Surgical History  Procedure Laterality Date  . Rotator cuff curgery  2011    lt rotator cuff  . Cardiac catheterization  4 /19/ 2013    [place]   . Coronary artery bypass graft  01/05/2012    Procedure: CORONARY ARTERY BYPASS GRAFTING (CABG);  Surgeon: Rexene Alberts, MD;  Location: [place];  Service: Open Heart Surgery;  Laterality: N/A;  coronary artery bypass graft times five using left internal mammary artery and right leg greater saphenous vein harvested endoscopically   . Shoulder surgery  1997    bilateral  . Colonoscopy w/ polypectomy  09/16/2007    colon polyps.  Repeat 5 years. I[facility].  . Polysomnography  07/2013    +severe OSA; CPAP at 10cm pressure recommended.  . Cholecystectomy  11/03/13  . Colonoscopy  11/01/13    multiple  polyps; diverticulosis. [place]. Repeat 5 years.  . Left heart catheterization with coronary angiogram N/A 01/02/2012    Procedure: LEFT HEART CATHETERIZATION WITH CORONARY ANGIOGRAM;  Surgeon: Troy Sine, MD;  Location: [facility] CATH LAB;  Service: Cardiovascular;  Laterality: N/A;   Allergies  Allergies  Allergen Reactions  . Penicillins Itching    tingling  . Tramadol Rash   HPI  54 y/o male with the above complex problem list.  He is s/p CABG x 5 in 12/2011.  He has had intermittent fleeting c/p ever since his surgery and had an ETT in July 2015 that was mildly positive for ischemia with 23mm horizontal ST depression in V3-V6 in the setting of a hypertensive response to exercise.  He has been managed medically and overall continues to do well.  He works full-time as a Government social research officer.  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  He has increased his activity at home and says that he is now walking or jogging 2-3 days/wk.  Though he continues to have intermittent, fleeting left chest discomfort, this has not changed in frequency or intensity in years, and does not limit his activity or require nitrates.  He denies palpitations, dyspnea, pnd, orthopnea,  n, v, dizziness, syncope, edema, weight gain, or early satiety.   Home Medications  Prior to Admission medications   Medication Sig Start Date End Date Taking? Authorizing Provider  aspirin 325 MG tablet Take 325 mg by mouth daily.   Yes Historical Provider, MD  atorvastatin (LIPITOR) 40 MG tablet Take 1 tablet (40 mg total) by mouth daily at 6 PM. 05/15/14  Yes [name], MD  canagliflozin [facility]) 300 MG TABS tablet Take 300 mg by mouth daily before breakfast. 11/21/14  Yes [name], MD  clonazePAM (KLONOPIN) 1 MG tablet Take 1 tablet (1 mg total) by mouth 2 (two) times daily. 05/15/14  Yes [name], MD  esomeprazole (NEXIUM) 40 MG capsule TAKE ONE CAPSULE BY MOUTH EVERY DAY. 10/23/14  Yes Chelle S  Jeffery, PA-C  glipiZIDE (GLUCOTROL XL) 10 MG 24 hr tablet Take 1 tablet (10 mg total) by mouth daily with breakfast. 08/14/14  Yes [name], MD  metFORMIN (GLUCOPHAGE) 1000 MG tablet Take 1 tablet (1,000 mg total) by mouth 2 (two) times daily with a meal. 05/15/14  Yes [name], MD  metoprolol tartrate (LOPRESSOR) 25 MG tablet Take 1 tablet (25 mg total) by mouth 2 (two) times daily. 05/15/14  Yes [name], MD  Omega-3 Fatty Acids (FISH OIL) 1200 MG CAPS Take by mouth 2 (two) times daily.   Yes Historical Provider, MD  sitaGLIPtin (JANUVIA) 100 MG tablet Take 1 tablet (100 mg total) by mouth daily. 05/19/14  Yes [name], MD  venlafaxine XR (EFFEXOR-XR) 150 MG 24 hr capsule Take 1 capsule (150 mg total) by mouth daily with breakfast. 05/15/14  Yes [name], MD    Review of Systems  As above, he continues to have intermittent, fleeting chest discomfort.  He denies palpitations, dyspnea, pnd, orthopnea, n, v, dizziness, syncope, edema, weight gain, or early satiety.  All other systems reviewed and are otherwise negative except as noted above.  Physical Exam  VS:  BP 118/68 mmHg  Pulse 64  Ht 5\' 10"  (1.778 m)  Wt 184 lb 8 oz (83.689 kg)  BMI 26.47 kg/m2 , BMI Body mass index is 26.47 kg/(m^2). GEN: Well nourished, well developed, in no acute distress. HEENT: normal. Neck: Supple, no JVD, carotid bruits, or masses. Cardiac: RRR, no murmurs, rubs, or gallops. No clubbing, cyanosis, edema.  Radials/DP/PT 2+ and equal bilaterally.  Respiratory:  Respirations regular and unlabored, clear to auscultation bilaterally. GI: Soft, nontender, nondistended, BS + x 4. MS: no deformity or atrophy. Skin: warm and dry, no rash. Neuro:  Strength and sensation are intact. Psych: Normal affect.  Accessory Clinical Findings  ECG - rsr, 64, no acute st/t changes.  Assessment & Plan  1.  CAD: s/p CABG x 5 in 12/2011.  He does continue to have intermittent, fleeting, left chest  discomfort, but this is unchanged since his CABG and does not limit his activity or require nitrates.  Overall, he is doing well and has been increasing his activity @ home.  He remains on asa (reduce to 81 mg), statin, bb.  He will require an annual ETT for his CDL license and is due for this in July.  2.  HTN:  Stable on bb.  3.  HL:  LDL 54 on 3/7 with nl LFTs @ that time. Cont lipitor 40.  4.  DM II:  Managed by PCP.  Most recent A1c on 3/7 was 7.8, which is down from 10.5 in  November 2015.  5.  GERD:  Stable on PPI.  6.  Dispo:  F/U with Dr. Fletcher Anon for ETT in July, or sooner if necessary.  Murray Hodgkins, NP 11/27/2014, 11:49 AM

## 2014-12-06 ENCOUNTER — Other Ambulatory Visit: Payer: Self-pay | Admitting: Family Medicine

## 2014-12-07 NOTE — Telephone Encounter (Signed)
Called in.

## 2014-12-07 NOTE — Telephone Encounter (Signed)
Please call in refill to pharmacy.

## 2014-12-14 ENCOUNTER — Other Ambulatory Visit: Payer: Self-pay | Admitting: Physician Assistant

## 2014-12-25 LAB — HM DIABETES EYE EXAM

## 2015-01-04 ENCOUNTER — Other Ambulatory Visit: Payer: Self-pay | Admitting: Physician Assistant

## 2015-01-06 NOTE — Op Note (Signed)
PATIENT NAME:  Joseph Terry, Joseph Terry MR#:  [id number] DATE OF BIRTH:  [birth year]  DATE OF PROCEDURE:  11/04/2013  PREOPERATIVE DIAGNOSIS: Chronic cholecystitis and cholelithiasis.   POSTOPERATIVE DIAGNOSIS: Chronic cholecystitis and cholelithiasis.   PROCEDURES: Laparoscopy, cholecystectomy, and attempted cholangiogram.   SURGEON: Mckinley Jewel, M.D.   ANESTHESIA: General.   COMPLICATIONS: None.  ESTIMATED BLOOD LOSS: Approximately 75 to 100 mL.   DRAINS: None.   DESCRIPTION OF PROCEDURE: The patient was put to sleep in the supine position on the operating room table. The abdomen was prepped and draped out as a sterile field. Timeout procedure was performed. A small incision was made at the umbilicus and a Veress needle with the InnerDyne sleeve was positioned and the peritoneal cavity verified with the hanging drop method. Pneumoperitoneum was obtained followed by placement of a 10 mm port. The epigastric and 2 lateral 5 mm ports were placed. The gallbladder was visualized. It appeared to have a moderate amount of adhesions surrounding the midportion down to the cystic duct area. With careful cephalad traction, the gallbladder was freed of all the adhesions. The cystic duct was identified and was then freed. Kumar clamp and catheter were positioned. Cholangiogram was performed. There was good filling of the cystic duct which appeared to be somewhat tortuous, but it did not fill the bile duct satisfactorily. On repeated attempt, it did not seem to fill the bile duct satisfactorily and preferentially filled the gallbladder. In the absence of any obstructive symptoms, further attempts were not made. The Kumar catheter was used to decompress the gallbladder and removed. The cystic duct was hemoclipped and cut. The cystic artery was identified. This was hemoclipped and cut. The gallbladder was then dissected free from its bed using cautery. There was no tissue plane between the gallbladder and the liver and  some dissection involved liver substance itself which caused a moderate amount of oozing. After the gallbladder was freed, the gallbladder bed with inspected. One small focus of some bleeding was covered with a small piece of Surgicel. To minimize further oozing from the raw surface of the liver, it was covered with 5 mL of Surgiflo containing thrombin. The area was irrigated out and all fluid suctioned out from around the liver. The gallbladder was then placed in a retrieval bag and brought out through the umbilical port site and noted to contain multiple stones of about 2 to 4 mm size. The fascial opening in the umbilicus was closed with 0 Vicryl stitch, placed with the use of a suture passer. The right upper quadrant was inspected again. Hemostasis was intact. The pneumoperitoneum was released. The remaining ports were removed. All the skin incisions were then closed with subcuticular 4-0 Vicryl reinforced with Steri-Strips and tincture of benzoin. A dry sterile dressing was placed. The patient tolerated the procedure well with no immediate problems encountered. He was extubated and returned to the recovery room in stable condition.  ____________________________ S.Robinette Haines, MD sgs:sb D: 11/04/2013 13:36:52 ET T: 11/04/2013 15:12:12 ET JOB#: [id number]  cc: S.G. Jamal Collin, MD, <Dictator> [facility] Robinette Haines MD ELECTRONICALLY SIGNED 11/12/2013 10:02

## 2015-01-08 ENCOUNTER — Telehealth: Payer: Self-pay

## 2015-01-08 MED ORDER — ESOMEPRAZOLE MAGNESIUM 40 MG PO CPDR: 40.0000 mg | DELAYED_RELEASE_CAPSULE | Freq: Every day | ORAL | Status: DC

## 2015-01-08 NOTE — Telephone Encounter (Signed)
Pt would like his script for NEXIUM 40 MG capsule [[id number]] to be changed to the generic form, because his insurance has gone up on the price for  this script. Please advise at [phone]

## 2015-01-08 NOTE — Telephone Encounter (Signed)
Please advise 

## 2015-01-08 NOTE — Telephone Encounter (Signed)
Gave pt message.

## 2015-01-08 NOTE — Telephone Encounter (Signed)
Generic sent in to pharmacy.

## 2015-01-27 ENCOUNTER — Telehealth: Payer: Self-pay | Admitting: Family Medicine

## 2015-01-27 NOTE — Telephone Encounter (Signed)
lmom to call us to get information on his appt his appt was changed from 02/26/15 to 03/09/15 at 10:00

## 2015-02-26 ENCOUNTER — Ambulatory Visit: Payer: BLUE CROSS/BLUE SHIELD | Admitting: Family Medicine

## 2015-03-07 ENCOUNTER — Ambulatory Visit (INDEPENDENT_AMBULATORY_CARE_PROVIDER_SITE_OTHER): Payer: BLUE CROSS/BLUE SHIELD | Admitting: Family Medicine

## 2015-03-07 ENCOUNTER — Encounter: Payer: Self-pay | Admitting: Family Medicine

## 2015-03-07 VITALS — BP 133/73 | HR 57 | Temp 97.9°F | Resp 16 | Ht 70.5 in | Wt 176.0 lb

## 2015-03-07 DIAGNOSIS — F329 Major depressive disorder, single episode, unspecified: Secondary | ICD-10-CM | POA: Diagnosis not present

## 2015-03-07 DIAGNOSIS — IMO0001 Reserved for inherently not codable concepts without codable children: Secondary | ICD-10-CM

## 2015-03-07 DIAGNOSIS — Z9989 Dependence on other enabling machines and devices: Principal | ICD-10-CM

## 2015-03-07 DIAGNOSIS — E1165 Type 2 diabetes mellitus with hyperglycemia: Secondary | ICD-10-CM | POA: Diagnosis not present

## 2015-03-07 DIAGNOSIS — F418 Other specified anxiety disorders: Secondary | ICD-10-CM | POA: Diagnosis not present

## 2015-03-07 DIAGNOSIS — K219 Gastro-esophageal reflux disease without esophagitis: Secondary | ICD-10-CM | POA: Diagnosis not present

## 2015-03-07 DIAGNOSIS — G4733 Obstructive sleep apnea (adult) (pediatric): Secondary | ICD-10-CM | POA: Diagnosis not present

## 2015-03-07 DIAGNOSIS — F32A Depression, unspecified: Secondary | ICD-10-CM

## 2015-03-07 DIAGNOSIS — I10 Essential (primary) hypertension: Secondary | ICD-10-CM

## 2015-03-07 DIAGNOSIS — E785 Hyperlipidemia, unspecified: Secondary | ICD-10-CM

## 2015-03-07 DIAGNOSIS — I25709 Atherosclerosis of coronary artery bypass graft(s), unspecified, with unspecified angina pectoris: Secondary | ICD-10-CM | POA: Diagnosis not present

## 2015-03-07 MED ORDER — VENLAFAXINE HCL ER 75 MG PO CP24: 225.0000 mg | ORAL_CAPSULE | Freq: Every day | ORAL | Status: DC

## 2015-03-07 NOTE — Patient Instructions (Signed)
Diabetes and Exercise Exercising regularly is important. It is not just about losing weight. It has many health benefits, such as:  Improving your overall fitness, flexibility, and endurance.  Increasing your bone density.  Helping with weight control.  Decreasing your body fat.  Increasing your muscle strength.  Reducing stress and tension.  Improving your overall health. People with diabetes who exercise gain additional benefits because exercise:  Reduces appetite.  Improves the body's use of blood sugar (glucose).  Helps lower or control blood glucose.  Decreases blood pressure.  Helps control blood lipids (such as cholesterol and triglycerides).  Improves the body's use of the hormone insulin by:  Increasing the body's insulin sensitivity.  Reducing the body's insulin needs.  Decreases the risk for heart disease because exercising:  Lowers cholesterol and triglycerides levels.  Increases the levels of good cholesterol (such as high-density lipoproteins [HDL]) in the body.  Lowers blood glucose levels. YOUR ACTIVITY PLAN  Choose an activity that you enjoy and set realistic goals. Your health care provider or diabetes educator can help you make an activity plan that works for you. Exercise regularly as directed by your health care provider. This includes:  Performing resistance training twice a week such as push-ups, sit-ups, lifting weights, or using resistance bands.  Performing 150 minutes of cardio exercises each week such as walking, running, or playing sports.  Staying active and spending no more than 90 minutes at one time being inactive. Even short bursts of exercise are good for you. Three 10-minute sessions spread throughout the day are just as beneficial as a single 30-minute session. Some exercise ideas include:  Taking the dog for a walk.  Taking the stairs instead of the elevator.  Dancing to your favorite song.  Doing an exercise  video.  Doing your favorite exercise with a friend. RECOMMENDATIONS FOR EXERCISING WITH TYPE 1 OR TYPE 2 DIABETES   Check your blood glucose before exercising. If blood glucose levels are greater than 240 mg/dL, check for urine ketones. Do not exercise if ketones are present.  Avoid injecting insulin into areas of the body that are going to be exercised. For example, avoid injecting insulin into:  The arms when playing tennis.  The legs when jogging.  Keep a record of:  Food intake before and after you exercise.  Expected peak times of insulin action.  Blood glucose levels before and after you exercise.  The type and amount of exercise you have done.  Review your records with your health care provider. Your health care provider will help you to develop guidelines for adjusting food intake and insulin amounts before and after exercising.  If you take insulin or oral hypoglycemic agents, watch for signs and symptoms of hypoglycemia. They include:  Dizziness.  Shaking.  Sweating.  Chills.  Confusion.  Drink plenty of water while you exercise to prevent dehydration or heat stroke. Body water is lost during exercise and must be replaced.  Talk to your health care provider before starting an exercise program to make sure it is safe for you. Remember, almost any type of activity is better than none. Document Released: 11/22/2003 Document Revised: 01/16/2014 Document Reviewed: 02/08/2013 [employer] Patient Information 2015 [employer]. This information is not intended to replace advice given to you by your health care provider. Make sure you discuss any questions you have with your health care provider.  

## 2015-03-07 NOTE — Progress Notes (Signed)
Subjective:    Patient ID: Joseph Terry, male    DOB: Jul 26, 1961, 54 y.o.   MRN: [id number]  03/07/2015  Follow-up and Diabetes   HPI This 54 y.o. male presents for three month follow-up:  1.  DMII:  Increased Invokana to 300mg  daily; weight down 2 pounds from last visit.  Affordable with coupon.   Not exercising much now. Not checking sugars at home; never has checked sugars. Does get fatigued mid-morning and mid-afternoon after lunch.   Really watching diet closely.  2. HTN: stopped Lisinopril at last visit due to low BP reading.  Patient reports good compliance with medication, good tolerance to medication, and good symptom control.  Not checking BP at home.  3. Hyperlipidemia:  No changes to management made at last visit.  Patient reports good compliance with medication, good tolerance to medication, and good symptom control.    Denies HA/dizziness/focal weakness/paresthesias.  4.  Anxiety and depression:  No changes to management made at last visit.  Patient reports good compliance with medication, good tolerance to medication, and good symptom control.  Father passed since last visit.    5. 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  Patient reports good compliance with medication, good tolerance to medication, and good symptom control.    6. OSA: maintained on CPAP.  Moderate compliance with CPAP; cannot tell a difference with it.  No major fatigue or hypersomnolence.  At 10:00am every morning, loses energy.  Improves at 12:00pm.  Then fine for a while; after lunch down again.  Eats a big lunch rarely.   Terrible headache for one whole week.  Now several coworkers with horrible headache; went to [facility]; scared of serious problems.  Checked for sinus infection; did sinus flus with resolution.  No abx.  Took ten days; also dizzy with it.  Meclizine; Posterior neck.  Blood pressure was normal.  No associated sore  throat, ear pain, rhinorrhea; caused nausea.  Occurred in May.  No recurrent headaches other than stress headaches.     Review of Systems  Constitutional: Negative for fever, chills, diaphoresis, activity change, appetite change and fatigue.  Respiratory: Negative for cough and shortness of breath.   Cardiovascular: Negative for chest pain, palpitations and leg swelling.  Gastrointestinal: Negative for nausea, vomiting, abdominal pain and diarrhea.  Endocrine: Negative for cold intolerance, heat intolerance, polydipsia, polyphagia and polyuria.  Skin: Negative for color change, rash and wound.  Neurological: Negative for dizziness, tremors, seizures, syncope, facial asymmetry, speech difficulty, weakness, light-headedness, numbness and headaches.  Psychiatric/Behavioral: Negative for sleep disturbance and dysphoric mood. The patient is not nervous/anxious.     Past Medical History  Diagnosis Date  . Type II diabetes mellitus   . GERD (gastroesophageal reflux disease)   . Hypertension   . Hyperlipidemia   . CAD (coronary artery disease), severe multivessel CAD surgical consult for CABG     a. 12/2011 Cath: 3 vessel CAD with normal EF;  b. 12/2011 CABG x 5: LIMA to LAD, SVG to distal RCA with sequential SVG to PDA, SVG to OM, and SVG to D2;  c. 03/2014 ETT: Ex time 6:21, 8mm horizontal ST dep in V3-V6, HTN response.  Marland Kitchen Unspecified vitamin D deficiency   . Anemia   . Cervicalgia   . Anxiety state, unspecified   . Impotence of organic origin   . Internal hemorrhoids without mention of complication   . Personal history of colonic polyps   .  Diaphragmatic hernia without mention of obstruction or gangrene   . Depression   . OSA on CPAP     a. Sleep study 07/2013 +sever OSA; CPAP at 10cm recommended.  . Anemia   . Kidney stone   . Diverticulosis    Past Surgical History  Procedure Laterality Date  . Rotator cuff curgery  2011    lt rotator cuff  . Cardiac catheterization  4 /19/ 2013     [place]   . Coronary artery bypass graft  01/05/2012    Procedure: CORONARY ARTERY BYPASS GRAFTING (CABG);  Surgeon: Rexene Alberts, MD;  Location: [place];  Service: Open Heart Surgery;  Laterality: N/A;  coronary artery bypass graft times five using left internal mammary artery and right leg greater saphenous vein harvested endoscopically   . Shoulder surgery  1997    bilateral  . Colonoscopy w/ polypectomy  09/16/2007    colon polyps.  Repeat 5 years. I[facility].  . Polysomnography  07/2013    +severe OSA; CPAP at 10cm pressure recommended.  . Cholecystectomy  11/03/13  . Colonoscopy  11/01/13    multiple polyps; diverticulosis. [facility]. Repeat 5 years.  . Left heart catheterization with coronary angiogram N/A 01/02/2012    Procedure: LEFT HEART CATHETERIZATION WITH CORONARY ANGIOGRAM;  Surgeon: Troy Sine, MD;  Location: [facility] CATH LAB;  Service: Cardiovascular;  Laterality: N/A;   Allergies  Allergen Reactions  . Penicillins Itching    tingling  . Tramadol Rash   Current Outpatient Prescriptions  Medication Sig Dispense Refill  . aspirin 325 MG tablet Take 325 mg by mouth daily.    Marland Kitchen atorvastatin (LIPITOR) 40 MG tablet Take 1 tablet (40 mg total) by mouth daily at 6 PM. 30 tablet 11  . canagliflozin (INVOKANA) 300 MG TABS tablet Take 300 mg by mouth daily before breakfast. 30 tablet 11  . clonazePAM (KLONOPIN) 1 MG tablet TAKE 1 TABLET TWICE DAILY 60 tablet 5  . esomeprazole (NEXIUM) 40 MG capsule Take 1 capsule (40 mg total) by mouth daily. 30 capsule 2  . glipiZIDE (GLUCOTROL XL) 10 MG 24 hr tablet Take 1 tablet (10 mg total) by mouth daily with breakfast. 30 tablet 11  . metFORMIN (GLUCOPHAGE) 1000 MG tablet Take 1 tablet (1,000 mg total) by mouth 2 (two) times daily with a meal. 60 tablet 11  . metoprolol tartrate (LOPRESSOR) 25 MG tablet Take 1 tablet (25 mg total) by mouth 2 (two) times daily. 60 tablet 11  . Omega-3 Fatty Acids (FISH OIL) 1200 MG CAPS Take by mouth 2 (two)  times daily.    . sitaGLIPtin (JANUVIA) 100 MG tablet Take 1 tablet (100 mg total) by mouth daily. 30 tablet 11  . venlafaxine XR (EFFEXOR-XR) 75 MG 24 hr capsule Take 3 capsules (225 mg total) by mouth daily with breakfast. 93 capsule 11   No current facility-administered medications for this visit.   History   Social History  . Marital Status: Divorced    Spouse Name: N/A  . Number of Children: 1  . Years of Education: N/A   Occupational History  . [employer]     x 30 yrs   Social History Main Topics  . Smoking status: Former Smoker -- 1.50 packs/day for 35 years    Types: Cigarettes    Quit date: 11/14/2011  . Smokeless tobacco: Not on file     Comment: quit 2 weeks prior to CABG 12/2011  . Alcohol Use: 0.6 oz/week  1 Cans of beer per week     Comment: Drinks once per month; beer.  . Drug Use: No  . Sexual Activity: Yes    Birth Control/ Protection: Condom   Other Topics Concern  . Not on file   Social History Narrative   Marital status: divorced since 1996; dating casually in 2014.      Lives: Lives alone.          Children:  One son and a granddaughter 70 years old live in [place].       Tobacco: quit with CABG      Alcohol: weekends; 6-8 beers.      Exercise: Light, yard work; playing with son's puppy formal        Employment:  Works 45-50 hrs. Some weeks.  [employer].  Desk work since 05/2013.      Guns:  Guns in the home not stored in locked cabinet. Smoke alarm in home, Always wears seatbelts.       Sexual History: 38; Last HIV testing 1996 with divorce; has has 54 partners since divorce.                 Objective:    BP 133/73 mmHg  Pulse 57  Temp(Src) 97.9 F (36.6 C)  Resp 16  Ht 5' 10.5" (1.791 m)  Wt 176 lb (79.833 kg)  BMI 24.89 kg/m2  SpO2 97% Physical Exam  Constitutional: He is oriented to person, place, and time. He appears well-developed and well-nourished. No distress.  HENT:  Head: Normocephalic and atraumatic.    Right Ear: External ear normal.  Left Ear: External ear normal.  Nose: Nose normal.  Mouth/Throat: Oropharynx is clear and moist.  Eyes: Conjunctivae and EOM are normal. Pupils are equal, round, and reactive to light.  Neck: Normal range of motion. Neck supple. Carotid bruit is not present. No thyromegaly present.  Cardiovascular: Normal rate, regular rhythm, normal heart sounds and intact distal pulses.  Exam reveals no gallop and no friction rub.   No murmur heard. Pulmonary/Chest: Effort normal and breath sounds normal. He has no wheezes. He has no rales.  Abdominal: Soft. Bowel sounds are normal. He exhibits no distension and no mass. There is no tenderness. There is no rebound and no guarding.  Lymphadenopathy:    He has no cervical adenopathy.  Neurological: He is alert and oriented to person, place, and time. No cranial nerve deficit.  Skin: Skin is warm and dry. No rash noted. He is not diaphoretic.  Psychiatric: He has a normal mood and affect. His behavior is normal.  Nursing note and vitals reviewed.  Results for orders placed or performed in visit on 03/07/15  CBC with Differential/Platelet  Result Value Ref Range   WBC 6.1 3.4 - 10.8 x10E3/uL   RBC 5.49 4.14 - 5.80 x10E6/uL   Hemoglobin 12.9 12.6 - 17.7 g/dL   Hematocrit 41.0 37.5 - 51.0 %   MCV 75 (L) 79 - 97 fL   MCH 23.5 (L) 26.6 - 33.0 pg   MCHC 31.5 31.5 - 35.7 g/dL   RDW 16.8 (H) 12.3 - 15.4 %   Platelets 400 (H) 150 - 379 x10E3/uL   NEUTROPHILS 66 %   Lymphs 20 %   Monocytes 6 %   Eos 7 %   Basos 1 %   Neutrophils Absolute 4.0 1.4 - 7.0 x10E3/uL   Lymphocytes Absolute 1.2 0.7 - 3.1 x10E3/uL   Monocytes Absolute 0.4 0.1 - 0.9 x10E3/uL  EOS (ABSOLUTE) 0.4 0.0 - 0.4 x10E3/uL   Basophils Absolute 0.1 0.0 - 0.2 x10E3/uL   Immature Granulocytes 0 %   Immature Grans (Abs) 0.0 0.0 - 0.1 x10E3/uL  Comprehensive metabolic panel  Result Value Ref Range   Glucose 123 (H) 65 - 99 mg/dL   BUN 12 6 - 24 mg/dL    Creatinine, Ser 0.80 0.76 - 1.27 mg/dL   GFR calc non Af Amer 101 >59 mL/min/1.73   GFR calc Af Amer 116 >59 mL/min/1.73   BUN/Creatinine Ratio 15 9 - 20   Sodium 140 134 - 144 mmol/L   Potassium 4.7 3.5 - 5.2 mmol/L   Chloride 99 97 - 108 mmol/L   CO2 23 18 - 29 mmol/L   Calcium 10.3 (H) 8.7 - 10.2 mg/dL   Total Protein 7.4 6.0 - 8.5 g/dL   Albumin 4.9 3.5 - 5.5 g/dL   Globulin, Total 2.5 1.5 - 4.5 g/dL   Albumin/Globulin Ratio 2.0 1.1 - 2.5   Bilirubin Total 0.3 0.0 - 1.2 mg/dL   Alkaline Phosphatase 83 39 - 117 IU/L   AST 25 0 - 40 IU/L   ALT 38 0 - 44 IU/L  Hemoglobin A1c  Result Value Ref Range   Hgb A1c MFr Bld 7.5 (H) 4.8 - 5.6 %   Est. average glucose Bld gHb Est-mCnc 169 mg/dL  Lipid panel  Result Value Ref Range   Cholesterol, Total 135 100 - 199 mg/dL   Triglycerides 136 0 - 149 mg/dL   HDL 42 >39 mg/dL   VLDL Cholesterol Cal 27 5 - 40 mg/dL   LDL Calculated 66 0 - 99 mg/dL   Chol/HDL Ratio 3.2 0.0 - 5.0 ratio units       Assessment & Plan:   1. OSA on CPAP   2. Essential hypertension   3. Hyperlipidemia   4. Gastroesophageal reflux disease without esophagitis   5. Diabetes mellitus type 2, uncontrolled, without complications   6. Depression with anxiety   7. Coronary artery disease involving coronary bypass graft of native heart with unspecified angina pectoris   8. Depression     Meds ordered this encounter  Medications  . venlafaxine XR (EFFEXOR-XR) 75 MG 24 hr capsule    Sig: Take 3 capsules (225 mg total) by mouth daily with breakfast.    Dispense:  93 capsule    Refill:  11    Return in about 3 months (around 06/07/2015) for complete physical examiniation.     Elayne Guerin, M.D. [facility] [address] [place], [place]  [address] [phone] phone [phone] fax

## 2015-03-08 ENCOUNTER — Ambulatory Visit: Payer: BLUE CROSS/BLUE SHIELD | Admitting: Physician Assistant

## 2015-03-08 LAB — CBC WITH DIFFERENTIAL/PLATELET
Basophils Absolute: 0.1 10*3/uL (ref 0.0–0.2)
Basos: 1 %
EOS (ABSOLUTE): 0.4 10*3/uL (ref 0.0–0.4)
Eos: 7 %
HEMOGLOBIN: 12.9 g/dL (ref 12.6–17.7)
Hematocrit: 41 % (ref 37.5–51.0)
Immature Grans (Abs): 0 10*3/uL (ref 0.0–0.1)
Immature Granulocytes: 0 %
LYMPHS ABS: 1.2 10*3/uL (ref 0.7–3.1)
Lymphs: 20 %
MCH: 23.5 pg — ABNORMAL LOW (ref 26.6–33.0)
MCHC: 31.5 g/dL (ref 31.5–35.7)
MCV: 75 fL — AB (ref 79–97)
Monocytes Absolute: 0.4 10*3/uL (ref 0.1–0.9)
Monocytes: 6 %
NEUTROS ABS: 4 10*3/uL (ref 1.4–7.0)
Neutrophils: 66 %
Platelets: 400 10*3/uL — ABNORMAL HIGH (ref 150–379)
RBC: 5.49 x10E6/uL (ref 4.14–5.80)
RDW: 16.8 % — AB (ref 12.3–15.4)
WBC: 6.1 10*3/uL (ref 3.4–10.8)

## 2015-03-08 LAB — COMPREHENSIVE METABOLIC PANEL
ALT: 38 IU/L (ref 0–44)
AST: 25 IU/L (ref 0–40)
Albumin/Globulin Ratio: 2 (ref 1.1–2.5)
Albumin: 4.9 g/dL (ref 3.5–5.5)
Alkaline Phosphatase: 83 IU/L (ref 39–117)
BUN/Creatinine Ratio: 15 (ref 9–20)
BUN: 12 mg/dL (ref 6–24)
Bilirubin Total: 0.3 mg/dL (ref 0.0–1.2)
CALCIUM: 10.3 mg/dL — AB (ref 8.7–10.2)
CO2: 23 mmol/L (ref 18–29)
CREATININE: 0.8 mg/dL (ref 0.76–1.27)
Chloride: 99 mmol/L (ref 97–108)
GFR calc Af Amer: 116 mL/min/{1.73_m2} (ref >59)
GFR calc non Af Amer: 101 mL/min/{1.73_m2} (ref >59)
GLUCOSE: 123 mg/dL — AB (ref 65–99)
Globulin, Total: 2.5 g/dL (ref 1.5–4.5)
Potassium: 4.7 mmol/L (ref 3.5–5.2)
Sodium: 140 mmol/L (ref 134–144)
Total Protein: 7.4 g/dL (ref 6.0–8.5)

## 2015-03-08 LAB — LIPID PANEL
Chol/HDL Ratio: 3.2 ratio units (ref 0.0–5.0)
Cholesterol, Total: 135 mg/dL (ref 100–199)
HDL: 42 mg/dL (ref >39)
LDL Calculated: 66 mg/dL (ref 0–99)
Triglycerides: 136 mg/dL (ref 0–149)
VLDL Cholesterol Cal: 27 mg/dL (ref 5–40)

## 2015-03-08 LAB — HEMOGLOBIN A1C
Est. average glucose Bld gHb Est-mCnc: 169 mg/dL
HEMOGLOBIN A1C: 7.5 % — AB (ref 4.8–5.6)

## 2015-03-09 ENCOUNTER — Ambulatory Visit: Payer: BLUE CROSS/BLUE SHIELD | Admitting: Family Medicine

## 2015-03-22 ENCOUNTER — Encounter: Payer: BLUE CROSS/BLUE SHIELD | Admitting: Cardiovascular Disease

## 2015-03-28 ENCOUNTER — Encounter (INDEPENDENT_AMBULATORY_CARE_PROVIDER_SITE_OTHER): Payer: BLUE CROSS/BLUE SHIELD

## 2015-03-28 DIAGNOSIS — I25709 Atherosclerosis of coronary artery bypass graft(s), unspecified, with unspecified angina pectoris: Secondary | ICD-10-CM

## 2015-03-28 NOTE — Patient Instructions (Signed)
Your stress test does not show signs concerning for ischemia.  Please contact our office if you develop any chest pain or shortness of breath.

## 2015-04-05 ENCOUNTER — Encounter: Payer: BLUE CROSS/BLUE SHIELD | Admitting: Cardiovascular Disease

## 2015-04-09 ENCOUNTER — Telehealth: Payer: Self-pay

## 2015-04-09 NOTE — Telephone Encounter (Signed)
Patient calling regarding his test results    [phone]

## 2015-04-09 NOTE — Telephone Encounter (Signed)
Left VM to call back for results.

## 2015-04-14 ENCOUNTER — Telehealth: Payer: Self-pay | Admitting: Family Medicine

## 2015-04-14 NOTE — Telephone Encounter (Signed)
lmom to call back about labs

## 2015-04-15 NOTE — Telephone Encounter (Signed)
lmom to call back 

## 2015-04-18 NOTE — Telephone Encounter (Signed)
Patient received all lab results and A1C results were faxed to Dr. Rosanna Randy. He kept receiving calls about lab results even after he was already informed. He has no other questions at this time.

## 2015-04-28 ENCOUNTER — Other Ambulatory Visit: Payer: Self-pay | Admitting: Family Medicine

## 2015-05-20 ENCOUNTER — Other Ambulatory Visit: Payer: Self-pay | Admitting: Family Medicine

## 2015-05-26 ENCOUNTER — Other Ambulatory Visit: Payer: Self-pay | Admitting: Family Medicine

## 2015-06-04 ENCOUNTER — Other Ambulatory Visit: Payer: Self-pay | Admitting: Family Medicine

## 2015-06-05 NOTE — Telephone Encounter (Signed)
Please call in refill of Klonopin as approved.

## 2015-06-07 NOTE — Telephone Encounter (Signed)
Called in.

## 2015-06-17 ENCOUNTER — Other Ambulatory Visit: Payer: Self-pay | Admitting: Family Medicine

## 2015-06-18 ENCOUNTER — Encounter: Payer: Self-pay | Admitting: Family Medicine

## 2015-06-18 ENCOUNTER — Ambulatory Visit (INDEPENDENT_AMBULATORY_CARE_PROVIDER_SITE_OTHER): Payer: BLUE CROSS/BLUE SHIELD | Admitting: Family Medicine

## 2015-06-18 VITALS — BP 111/65 | HR 52 | Temp 98.3°F | Resp 16 | Ht 70.0 in | Wt 172.8 lb

## 2015-06-18 DIAGNOSIS — Z8042 Family history of malignant neoplasm of prostate: Secondary | ICD-10-CM

## 2015-06-18 DIAGNOSIS — K219 Gastro-esophageal reflux disease without esophagitis: Secondary | ICD-10-CM

## 2015-06-18 DIAGNOSIS — Z0000 Encounter for general adult medical examination without abnormal findings: Secondary | ICD-10-CM | POA: Diagnosis not present

## 2015-06-18 DIAGNOSIS — F418 Other specified anxiety disorders: Secondary | ICD-10-CM | POA: Diagnosis not present

## 2015-06-18 DIAGNOSIS — Z86010 Personal history of colonic polyps: Secondary | ICD-10-CM | POA: Diagnosis not present

## 2015-06-18 DIAGNOSIS — K8020 Calculus of gallbladder without cholecystitis without obstruction: Secondary | ICD-10-CM | POA: Diagnosis not present

## 2015-06-18 DIAGNOSIS — I10 Essential (primary) hypertension: Secondary | ICD-10-CM | POA: Diagnosis not present

## 2015-06-18 DIAGNOSIS — G4733 Obstructive sleep apnea (adult) (pediatric): Secondary | ICD-10-CM | POA: Diagnosis not present

## 2015-06-18 DIAGNOSIS — Z23 Encounter for immunization: Secondary | ICD-10-CM

## 2015-06-18 DIAGNOSIS — E119 Type 2 diabetes mellitus without complications: Secondary | ICD-10-CM | POA: Diagnosis not present

## 2015-06-18 DIAGNOSIS — Z9989 Dependence on other enabling machines and devices: Secondary | ICD-10-CM

## 2015-06-18 DIAGNOSIS — Z1159 Encounter for screening for other viral diseases: Secondary | ICD-10-CM | POA: Diagnosis not present

## 2015-06-18 DIAGNOSIS — I25810 Atherosclerosis of coronary artery bypass graft(s) without angina pectoris: Secondary | ICD-10-CM

## 2015-06-18 DIAGNOSIS — E785 Hyperlipidemia, unspecified: Secondary | ICD-10-CM

## 2015-06-18 LAB — HEPATITIS C ANTIBODY: HCV Ab: NEGATIVE

## 2015-06-18 LAB — COMPREHENSIVE METABOLIC PANEL
ALT: 24 U/L (ref 9–46)
AST: 15 U/L (ref 10–35)
Albumin: 4.7 g/dL (ref 3.6–5.1)
Alkaline Phosphatase: 70 U/L (ref 40–115)
BILIRUBIN TOTAL: 0.4 mg/dL (ref 0.2–1.2)
BUN: 14 mg/dL (ref 7–25)
CO2: 27 mmol/L (ref 20–31)
CREATININE: 0.76 mg/dL (ref 0.70–1.33)
Calcium: 9.8 mg/dL (ref 8.6–10.3)
Chloride: 101 mmol/L (ref 98–110)
GLUCOSE: 94 mg/dL (ref 65–99)
Potassium: 4.9 mmol/L (ref 3.5–5.3)
SODIUM: 139 mmol/L (ref 135–146)
Total Protein: 7.6 g/dL (ref 6.1–8.1)

## 2015-06-18 LAB — POCT URINALYSIS DIP (MANUAL ENTRY)
BILIRUBIN UA: NEGATIVE
Blood, UA: NEGATIVE
Glucose, UA: >=1000 — AB
Ketones, POC UA: NEGATIVE
LEUKOCYTES UA: NEGATIVE
NITRITE UA: NEGATIVE
PH UA: 5
PROTEIN UA: NEGATIVE
Spec Grav, UA: 1.01
Urobilinogen, UA: 0.2

## 2015-06-18 LAB — CBC WITH DIFFERENTIAL/PLATELET
BASOS ABS: 0.1 10*3/uL (ref 0.0–0.1)
BASOS PCT: 1 % (ref 0–1)
EOS ABS: 0.6 10*3/uL (ref 0.0–0.7)
Eosinophils Relative: 8 % — ABNORMAL HIGH (ref 0–5)
HCT: 40.6 % (ref 39.0–52.0)
Hemoglobin: 12.9 g/dL — ABNORMAL LOW (ref 13.0–17.0)
Lymphocytes Relative: 22 % (ref 12–46)
Lymphs Abs: 1.7 10*3/uL (ref 0.7–4.0)
MCH: 23.5 pg — ABNORMAL LOW (ref 26.0–34.0)
MCHC: 31.8 g/dL (ref 30.0–36.0)
MCV: 74 fL — ABNORMAL LOW (ref 78.0–100.0)
MPV: 9.5 fL (ref 8.6–12.4)
Monocytes Absolute: 0.4 10*3/uL (ref 0.1–1.0)
Monocytes Relative: 5 % (ref 3–12)
NEUTROS PCT: 64 % (ref 43–77)
Neutro Abs: 5 10*3/uL (ref 1.7–7.7)
PLATELETS: 384 10*3/uL (ref 150–400)
RBC: 5.49 MIL/uL (ref 4.22–5.81)
RDW: 16.7 % — ABNORMAL HIGH (ref 11.5–15.5)
WBC: 7.8 10*3/uL (ref 4.0–10.5)

## 2015-06-18 LAB — HEMOGLOBIN A1C
HEMOGLOBIN A1C: 6.8 % — AB (ref <5.7)
MEAN PLASMA GLUCOSE: 148 mg/dL — AB (ref <117)

## 2015-06-18 LAB — TSH: TSH: 1.133 u[IU]/mL (ref 0.350–4.500)

## 2015-06-18 LAB — MICROALBUMIN, URINE: Microalb, Ur: 0.4 mg/dL (ref <2.0)

## 2015-06-18 MED ORDER — VENLAFAXINE HCL ER 150 MG PO CP24: 150.0000 mg | ORAL_CAPSULE | Freq: Every day | ORAL | Status: DC

## 2015-06-18 MED ORDER — SITAGLIPTIN PHOSPHATE 100 MG PO TABS: ORAL_TABLET | ORAL | Status: DC

## 2015-06-18 MED ORDER — METOPROLOL TARTRATE 25 MG PO TABS: ORAL_TABLET | ORAL | Status: DC

## 2015-06-18 MED ORDER — ESOMEPRAZOLE MAGNESIUM 40 MG PO CPDR: 40.0000 mg | DELAYED_RELEASE_CAPSULE | Freq: Every day | ORAL | Status: DC

## 2015-06-18 MED ORDER — CANAGLIFLOZIN 300 MG PO TABS: 300.0000 mg | ORAL_TABLET | Freq: Every day | ORAL | Status: DC

## 2015-06-18 MED ORDER — CLONAZEPAM 1 MG PO TABS: 1.0000 mg | ORAL_TABLET | Freq: Two times a day (BID) | ORAL | Status: DC

## 2015-06-18 MED ORDER — GLIPIZIDE ER 10 MG PO TB24: 10.0000 mg | ORAL_TABLET | Freq: Every day | ORAL | Status: DC

## 2015-06-18 MED ORDER — METFORMIN HCL 1000 MG PO TABS: 1000.0000 mg | ORAL_TABLET | Freq: Two times a day (BID) | ORAL | Status: DC

## 2015-06-18 MED ORDER — ATORVASTATIN CALCIUM 40 MG PO TABS: ORAL_TABLET | ORAL | Status: DC

## 2015-06-18 NOTE — Patient Instructions (Signed)
Keeping you healthy  Get these tests  Blood pressure- Have your blood pressure checked once a year by your healthcare provider.  Normal blood pressure is 120/80  Weight- Have your body mass index (BMI) calculated to screen for obesity.  BMI is a measure of body fat based on height and weight. You can also calculate your own BMI at [email or web address].[email or web address]/bmi/.  Cholesterol- Have your cholesterol checked every year.  Diabetes- Have your blood sugar checked regularly if you have high blood pressure, high cholesterol, have a family history of diabetes or if you are overweight.  Screening for Colon Cancer- Colonoscopy starting at age 50.  Screening may begin sooner depending on your family history and other health conditions. Follow up colonoscopy as directed by your Gastroenterologist.  Screening for Prostate Cancer- Both blood work (PSA) and a rectal exam help screen for Prostate Cancer.  Screening begins at age 40 with African-American men and at age 50 with Caucasian men.  Screening may begin sooner depending on your family history.  Take these medicines  Aspirin- One aspirin daily can help prevent Heart disease and Stroke.  Flu shot- Every fall.  Tetanus- Every 10 years.  Zostavax- Once after the age of 56 to prevent Shingles.  Pneumonia shot- Once after the age of 56; if you are younger than 56, ask your healthcare provider if you need a Pneumonia shot.  Take these steps  Don't smoke- If you do smoke, talk to your doctor about quitting.  For tips on how to quit, go to www.smokefree.gov or call 1-800-QUIT-NOW.  Be physically active- Exercise 5 days a week for at least 30 minutes.  If you are not already physically active start slow and gradually work up to 30 minutes of moderate physical activity.  Examples of moderate activity include walking briskly, mowing the yard, dancing, swimming, bicycling, etc.  Eat a healthy diet- Eat a variety of healthy food such as fruits, vegetables, low  fat milk, low fat cheese, yogurt, lean meant, poultry, fish, beans, tofu, etc. For more information go to www.thenutritionsource.org  Drink alcohol in moderation- Limit alcohol intake to less than two drinks a day. Never drink and drive.  Dentist- Brush and floss twice daily; visit your dentist twice a year.  Depression- Your emotional health is as important as your physical health. If you're feeling down, or losing interest in things you would normally enjoy please talk to your healthcare provider.  Eye exam- Visit your eye doctor every year.  Safe sex- If you may be exposed to a sexually transmitted infection, use a condom.  Seat belts- Seat belts can save your life; always wear one.  Smoke/Carbon Monoxide detectors- These detectors need to be installed on the appropriate level of your home.  Replace batteries at least once a year.  Skin cancer- When out in the sun, cover up and use sunscreen 15 SPF or higher.  Violence- If anyone is threatening you, please tell your healthcare provider.  Living Will/ Health care power of attorney- Speak with your healthcare provider and family. 

## 2015-06-18 NOTE — Progress Notes (Signed)
Subjective:    Patient ID: Joseph Terry, male    DOB: August 04, 1961, 54 y.o.   MRN: [id number]  06/18/2015  Annual Exam and Medication Refill   HPI This 54 y.o. male presents for Complete Physical Examination.  Last physical:  05-15-2014 Colonoscopy:  2015 TDAP:  2009 Pneumovax:  2015 Zostavax: never Influenza:  refuses Eye exam:  10/16/2014  [place]. Dental exam:  09/2013  Total sexual partners in past year = 4.    Review of Systems  Constitutional: Negative for fever, chills, diaphoresis, activity change, appetite change, fatigue and unexpected weight change.  HENT: Negative for congestion, dental problem, drooling, ear discharge, ear pain, facial swelling, hearing loss, mouth sores, nosebleeds, postnasal drip, rhinorrhea, sinus pressure, sneezing, sore throat, tinnitus, trouble swallowing and voice change.   Eyes: Negative for photophobia, pain, discharge, redness, itching and visual disturbance.  Respiratory: Negative for apnea, cough, choking, chest tightness, shortness of breath, wheezing and stridor.   Cardiovascular: Positive for chest pain. Negative for palpitations and leg swelling.       +same sharp chest pain that occurs with heavy lifting; onset after CABG; no exertional chest pain.    Gastrointestinal: Negative for nausea, vomiting, abdominal pain, diarrhea, constipation, blood in stool, abdominal distention, anal bleeding and rectal pain.  Endocrine: Negative for cold intolerance, heat intolerance, polydipsia, polyphagia and polyuria.  Genitourinary: Negative for dysuria, urgency, frequency, hematuria, flank pain, decreased urine volume, discharge, penile swelling, scrotal swelling, enuresis, difficulty urinating, genital sores, penile pain and testicular pain.       +decreased libido.  Musculoskeletal: Negative for myalgias, back pain, joint swelling, arthralgias, gait problem, neck pain and neck stiffness.  Skin: Negative for color change, pallor, rash and wound.    Allergic/Immunologic: Negative for environmental allergies, food allergies and immunocompromised state.  Neurological: Negative for dizziness, tremors, seizures, syncope, facial asymmetry, speech difficulty, weakness, light-headedness, numbness and headaches.  Hematological: Negative for adenopathy. Does not bruise/bleed easily.  Psychiatric/Behavioral: Positive for sleep disturbance. Negative for suicidal ideas, hallucinations, behavioral problems, confusion, self-injury, dysphoric mood, decreased concentration and agitation. The patient is not nervous/anxious and is not hyperactive.     Past Medical History  Diagnosis Date  . Type II diabetes mellitus ([place])   . GERD (gastroesophageal reflux disease)   . Hypertension   . Hyperlipidemia   . CAD (coronary artery disease), severe multivessel CAD surgical consult for CABG     a. 12/2011 Cath: 3 vessel CAD with normal EF;  b. 12/2011 CABG x 5: LIMA to LAD, SVG to distal RCA with sequential SVG to PDA, SVG to OM, and SVG to D2;  c. 03/2014 ETT: Ex time 6:21, 73mm horizontal ST dep in V3-V6, HTN response.  Marland Kitchen Unspecified vitamin D deficiency   . Anemia   . Cervicalgia   . Anxiety state, unspecified   . Impotence of organic origin   . Internal hemorrhoids without mention of complication   . Personal history of colonic polyps   . Diaphragmatic hernia without mention of obstruction or gangrene   . Depression   . OSA on CPAP     a. Sleep study 07/2013 +sever OSA; CPAP at 10cm recommended.  . Anemia   . Kidney stone   . Diverticulosis    Past Surgical History  Procedure Laterality Date  . Rotator cuff curgery  2011    lt rotator cuff  . Cardiac catheterization  4 /19/ 2013    [place]   . Coronary artery bypass graft  01/05/2012  Procedure: CORONARY ARTERY BYPASS GRAFTING (CABG);  Surgeon: Rexene Alberts, MD;  Location: [facility];  Service: Open Heart Surgery;  Laterality: N/A;  coronary artery bypass graft times five using left internal  mammary artery and right leg greater saphenous vein harvested endoscopically   . Shoulder surgery  1997    bilateral  . Colonoscopy w/ polypectomy  09/16/2007    colon polyps.  Repeat 5 years. Iftikhar.  . Polysomnography  07/2013    +severe OSA; CPAP at 10cm pressure recommended.  . Cholecystectomy  11/03/13  . Colonoscopy  11/01/13    multiple polyps; diverticulosis. Sankar. Repeat 5 years.  . Left heart catheterization with coronary angiogram N/A 01/02/2012    Procedure: LEFT HEART CATHETERIZATION WITH CORONARY ANGIOGRAM;  Surgeon: Troy Sine, MD;  Location: [facility] C[facility] LAB;  Service: Cardiovascular;  Laterality: N/A;   Allergies  Allergen Reactions  . Penicillins Itching    tingling  . Tramadol Rash   Current Outpatient Prescriptions  Medication Sig Dispense Refill  . aspirin 325 MG tablet Take 325 mg by mouth daily.    Marland Kitchen atorvastatin (LIPITOR) 40 MG tablet TAKE 1 TABLET (40 MG TOTAL) BY MOUTH DAILY AT 6 PM 30 tablet 11  . canagliflozin (INVOKANA) 300 MG TABS tablet Take 300 mg by mouth daily before breakfast. 30 tablet 11  . clonazePAM (KLONOPIN) 1 MG tablet Take 1 tablet (1 mg total) by mouth 2 (two) times daily. 60 tablet 5  . esomeprazole (NEXIUM) 40 MG capsule Take 1 capsule (40 mg total) by mouth daily. 30 capsule 11  . glipiZIDE (GLUCOTROL XL) 10 MG 24 hr tablet Take 1 tablet (10 mg total) by mouth daily with breakfast. 30 tablet 11  . metFORMIN (GLUCOPHAGE) 1000 MG tablet Take 1 tablet (1,000 mg total) by mouth 2 (two) times daily with a meal. 60 tablet 11  . metoprolol tartrate (LOPRESSOR) 25 MG tablet Take 1 tablet (25 mg total) by mouth 2 (two) times daily 60 tablet 11  . Omega-3 Fatty Acids (FISH OIL) 1200 MG CAPS Take by mouth 2 (two) times daily.    . sitaGLIPtin (JANUVIA) 100 MG tablet TAKE 1 TABLET (100 MG TOTAL) BY MOUTH DAILY. 30 tablet 11  . venlafaxine XR (EFFEXOR-XR) 150 MG 24 hr capsule Take 1 capsule (150 mg total) by mouth daily with breakfast. 30 capsule 11    No current facility-administered medications for this visit.   Social History   Social History  . Marital Status: Divorced    Spouse Name: N/A  . Number of Children: 1  . Years of Education: N/A   Occupational History  . [employer]     x 30 yrs   Social History Main Topics  . Smoking status: Former Smoker -- 1.50 packs/day for 35 years    Types: Cigarettes    Quit date: 11/14/2011  . Smokeless tobacco: Not on file     Comment: quit 2 weeks prior to CABG 12/2011  . Alcohol Use: 0.6 oz/week    1 Cans of beer per week     Comment: Drinks once per month; beer.  . Drug Use: No  . Sexual Activity: Yes    Birth Control/ Protection: Condom   Other Topics Concern  . Not on file   Social History Narrative   Marital status: divorced since 1996; dating casually in 2016.        Lives: Lives alone.          Children:  One son and a  granddaughter 73 years old live in [place].       Tobacco: quit with CABG      Alcohol: weekends; 6-8 beers on average.  Beers with football; ten on football weekends.      Exercise: Light, yard work.        Employment:  Works 45-50 hrs. Some weeks.  [employer].  Desk work since 05/2013. Since 1979.      Guns:  Guns in the home not stored in locked cabinet. Smoke alarm in home, Always wears seatbelts.       Sexual History: 101; Last HIV testing 2015 with divorce; has has 48 partners since divorce.             Family History  Problem Relation Age of Onset  . Heart attack Mother     defribillator in place  . Hypertension Mother   . Hyperlipidemia Mother   . Heart disease Mother     CHF with defibrillator;  CAD.  . Diabetes Mother   . Cancer Mother 71    Lung cancer  . Hypertension Father   . Hyperlipidemia Father   . Cancer Father 31     prostate cancer s/p  implants  . COPD Father   . Hypertension Sister   . Heart disease Maternal Grandmother   . Heart disease Maternal Grandfather   . Cancer Paternal Grandmother   . Cancer  Paternal Grandfather   . Hypertension Sister        Objective:    BP 111/65 mmHg  Pulse 52  Temp(Src) 98.3 F (36.8 C) (Oral)  Resp 16  Ht 5\' 10"  (1.778 m)  Wt 172 lb 12.8 oz (78.382 kg)  BMI 24.79 kg/m2 Physical Exam  Constitutional: He is oriented to person, place, and time. He appears well-developed and well-nourished. No distress.  HENT:  Head: Normocephalic and atraumatic.  Right Ear: External ear normal.  Left Ear: External ear normal.  Nose: Nose normal.  Mouth/Throat: Oropharynx is clear and moist.  Eyes: Conjunctivae and EOM are normal. Pupils are equal, round, and reactive to light.  Neck: Normal range of motion. Neck supple. Carotid bruit is not present. No thyromegaly present.  Cardiovascular: Normal rate, regular rhythm, normal heart sounds and intact distal pulses.  Exam reveals no gallop and no friction rub.   No murmur heard. Pulmonary/Chest: Effort normal and breath sounds normal. No respiratory distress. He has no wheezes. He has no rales.  Abdominal: Soft. Bowel sounds are normal. He exhibits no distension and no mass. There is no tenderness. There is no rebound and no guarding. Hernia confirmed negative in the right inguinal area and confirmed negative in the left inguinal area.  Genitourinary: Penis normal. Right testis shows no mass, no swelling and no tenderness. Left testis shows no mass, no swelling and no tenderness. Circumcised.  Musculoskeletal:       Right shoulder: Normal.       Left shoulder: Normal.       Cervical back: Normal.  Lymphadenopathy:    He has no cervical adenopathy.       Right: No inguinal adenopathy present.       Left: No inguinal adenopathy present.  Neurological: He is alert and oriented to person, place, and time. He has normal reflexes. No cranial nerve deficit. He exhibits normal muscle tone. Coordination normal.  Skin: Skin is warm and dry. No rash noted. He is not diaphoretic.  Psychiatric: He has a normal mood and affect.  His behavior is  normal. Judgment and thought content normal.   PNEUMOVAX AND HEPATITIS B#1 ADMINISTERED.  PATIENT REFUSED INFLUENZA VACCINE.     Assessment & Plan:   1. Routine physical examination   2. Symptomatic cholelithiasis   3. Personal history of colonic polyps   4. OSA on CPAP   5. Essential hypertension   6. Gastroesophageal reflux disease without esophagitis   7. Depression with anxiety   8. Coronary artery disease involving coronary bypass graft of native heart without angina pectoris   9. Need for hepatitis C screening test   10. Type 2 diabetes mellitus without complication, without long-term current use of insulin ([place])   11. Need for prophylactic vaccination against Streptococcus pneumoniae (pneumococcus)   12. Need for hepatitis B vaccination   13. Family history of prostate cancer    1.  Complete Physical Examination: anticipatory guidance --- exercise, weight maintenance.  Colonoscopy UTD. Reviewed immunizations -- s/p Pneumovax and Hepatitis B; pt refused flu vaccine.  Discussed prostate cancer screening guidelines; obtain PSA due to family hx of prostate cancer. 2.  OSA on CPAP: stable; continue CPAP use. 3.  HTN: stable on Metoprolol; Lisinopril recently stopped due to hypotension. 4.  GERD: controlled; refill provided. 5.  Depression with anxiety: controlled; refill of Effexor and Klonopin provided. 6.  CAD: stable; asymptomatic; followed by cardiology annually.  7.  Need for hepatitis C screening: obtain Hepatitis C ab in BB. 8.  DMII: controlled; obtain labs; refills provided. S/p Pneumovax and Hepatitis B vaccine.  Obtain record of last eye exam from Dr. Matilde Sprang. 75.  Family history of prostate cancer in father: obtain PSA; deferred prostate exam; discussed current guidelines with patient in detail.  10.  S/p Hepatitis B#1; RTC 3 months for Hepatitis B#2. 11.  S/p Pneumovax  Orders Placed This Encounter  Procedures  . Hepatitis B vaccine adult IM  .  Pneumococcal polysaccharide vaccine 23-valent greater than or equal to 2yo subcutaneous/IM  . CBC with Differential/Platelet  . Hemoglobin A1c  . Comprehensive metabolic panel  . TSH  . Microalbumin, urine  . Hepatitis C antibody  . PSA  . POCT urinalysis dipstick   Meds ordered this encounter  Medications  . atorvastatin (LIPITOR) 40 MG tablet    Sig: TAKE 1 TABLET (40 MG TOTAL) BY MOUTH DAILY AT 6 PM    Dispense:  30 tablet    Refill:  11  . canagliflozin (INVOKANA) 300 MG TABS tablet    Sig: Take 300 mg by mouth daily before breakfast.    Dispense:  30 tablet    Refill:  11  . esomeprazole (NEXIUM) 40 MG capsule    Sig: Take 1 capsule (40 mg total) by mouth daily.    Dispense:  30 capsule    Refill:  11  . glipiZIDE (GLUCOTROL XL) 10 MG 24 hr tablet    Sig: Take 1 tablet (10 mg total) by mouth daily with breakfast.    Dispense:  30 tablet    Refill:  11  . metFORMIN (GLUCOPHAGE) 1000 MG tablet    Sig: Take 1 tablet (1,000 mg total) by mouth 2 (two) times daily with a meal.    Dispense:  60 tablet    Refill:  11  . metoprolol tartrate (LOPRESSOR) 25 MG tablet    Sig: Take 1 tablet (25 mg total) by mouth 2 (two) times daily    Dispense:  60 tablet    Refill:  11  . clonazePAM (KLONOPIN) 1 MG tablet  Sig: Take 1 tablet (1 mg total) by mouth 2 (two) times daily.    Dispense:  60 tablet    Refill:  5    This request is for a new prescription for a controlled substance as required by Federal/State law.  . sitaGLIPtin (JANUVIA) 100 MG tablet    Sig: TAKE 1 TABLET (100 MG TOTAL) BY MOUTH DAILY.    Dispense:  30 tablet    Refill:  11  . venlafaxine XR (EFFEXOR-XR) 150 MG 24 hr capsule    Sig: Take 1 capsule (150 mg total) by mouth daily with breakfast.    Dispense:  30 capsule    Refill:  11    No Follow-up on file.     Elayne Guerin, M.D. [facility] [address] [place], [place]  [id number] [phone] phone [phone]  fax

## 2015-06-19 ENCOUNTER — Encounter: Payer: Self-pay | Admitting: Family Medicine

## 2015-06-19 LAB — PSA: PSA: 0.43 ng/mL (ref <=4.00)

## 2015-06-21 ENCOUNTER — Encounter: Payer: Self-pay | Admitting: Family Medicine

## 2015-06-21 ENCOUNTER — Telehealth: Payer: Self-pay | Admitting: *Deleted

## 2015-06-21 NOTE — Telephone Encounter (Signed)
Pt calling about his labs results from 06/18/15. Dr. Tamala Julian please advise.  Thank you

## 2015-06-22 NOTE — Telephone Encounter (Signed)
Spoke with patient; advised of lab results.

## 2015-06-22 NOTE — Telephone Encounter (Signed)
Pt calling again for his lab results.  Called to advise the pt that once results are reviewed by Dr. Tamala Julian we will call him with these results.

## 2015-06-25 NOTE — Addendum Note (Signed)
Addended by: Constance Goltz on: 06/25/2015 09:40 AM   Modules accepted: Miquel Dunn

## 2015-10-05 ENCOUNTER — Ambulatory Visit (INDEPENDENT_AMBULATORY_CARE_PROVIDER_SITE_OTHER): Payer: BLUE CROSS/BLUE SHIELD | Admitting: Family Medicine

## 2015-10-05 ENCOUNTER — Encounter: Payer: Self-pay | Admitting: Family Medicine

## 2015-10-05 VITALS — BP 113/63 | HR 63 | Temp 98.0°F | Resp 16 | Ht 70.0 in | Wt 175.6 lb

## 2015-10-05 DIAGNOSIS — F418 Other specified anxiety disorders: Secondary | ICD-10-CM

## 2015-10-05 DIAGNOSIS — K2270 Barrett's esophagus without dysplasia: Secondary | ICD-10-CM | POA: Diagnosis not present

## 2015-10-05 DIAGNOSIS — IMO0001 Reserved for inherently not codable concepts without codable children: Secondary | ICD-10-CM

## 2015-10-05 DIAGNOSIS — E785 Hyperlipidemia, unspecified: Secondary | ICD-10-CM | POA: Diagnosis not present

## 2015-10-05 DIAGNOSIS — I25810 Atherosclerosis of coronary artery bypass graft(s) without angina pectoris: Secondary | ICD-10-CM

## 2015-10-05 DIAGNOSIS — E1165 Type 2 diabetes mellitus with hyperglycemia: Secondary | ICD-10-CM

## 2015-10-05 DIAGNOSIS — K219 Gastro-esophageal reflux disease without esophagitis: Secondary | ICD-10-CM | POA: Diagnosis not present

## 2015-10-05 DIAGNOSIS — Z951 Presence of aortocoronary bypass graft: Secondary | ICD-10-CM

## 2015-10-05 DIAGNOSIS — Z9989 Dependence on other enabling machines and devices: Secondary | ICD-10-CM

## 2015-10-05 DIAGNOSIS — G4733 Obstructive sleep apnea (adult) (pediatric): Secondary | ICD-10-CM

## 2015-10-05 DIAGNOSIS — Z23 Encounter for immunization: Secondary | ICD-10-CM | POA: Diagnosis not present

## 2015-10-05 NOTE — Patient Instructions (Signed)
Hepatitis B Vaccine: What You Need to Know 1. Why get vaccinated? Hepatitis B is a serious disease that affects the liver. It is caused by the hepatitis B virus. Hepatitis B can cause mild illness lasting a few weeks, or it can lead to a serious, lifelong illness. Hepatitis B virus infection can be either acute or chronic. Acute hepatitis B virus infection is a short-term illness that occurs within the first 6 months after someone is exposed to the hepatitis B virus. This can lead to:  fever, fatigue, loss of appetite, nausea, and/or vomiting  jaundice (yellow skin or eyes, dark urine, clay-colored bowel movements)  pain in muscles, joints, and stomach Chronic hepatitis B virus infection is a long-term illness that occurs when the hepatitis B virus remains in a person's body. Most people who go on to develop chronic hepatitis B do not have symptoms, but it is still very serious and can lead to:  liver damage (cirrhosis)  liver cancer  death Chronically-infected people can spread hepatitis B virus to others, even if they do not feel or look sick themselves. Up to 1.4 million people in the [place] may have chronic hepatitis B infection. About 90% of infants who get hepatitis B become chronically infected and about 1 out of 4 of them dies. Hepatitis B is spread when blood, semen, or other body fluid infected with the Hepatitis B virus enters the body of a person who is not infected. People can become infected with the virus through:  Birth (a baby whose mother is infected can be infected at or after birth)  Sharing items such as razors or toothbrushes with an infected person  Contact with the blood or open sores of an infected person  Sex with an infected partner  Sharing needles, syringes, or other drug-injection equipment  Exposure to blood from needlesticks or other sharp instruments Each year about 2,000 people in the [place] die from hepatitis B-related liver  disease. Hepatitis B vaccine can prevent hepatitis B and its consequences, including liver cancer and cirrhosis. 2. Hepatitis B vaccine Hepatitis B vaccine is made from parts of the hepatitis B virus. It cannot cause hepatitis B infection. The vaccine is usually given as 3 or 4 shots over a 6-month period. Infants should get their first dose of hepatitis B vaccine at birth and will usually complete the series at 6 months of age. All children and adolescents younger than 19 years of age who have not yet gotten the vaccine should also be vaccinated. Hepatitis B vaccine is recommended for unvaccinated adults who are at risk for hepatitis B virus infection, including:  People whose sex partners have hepatitis B  Sexually active persons who are not in a long-term monogamous relationship  Persons seeking evaluation or treatment for a sexually transmitted disease  Men who have sexual contact with other men  People who share needles, syringes, or other drug-injection equipment  People who have household contact with someone infected with the hepatitis B virus  Health care and public safety workers at risk for exposure to blood or body fluids  Residents and staff of facilities for developmentally disabled persons  Persons in correctional facilities  Victims of sexual assault or abuse  Travelers to regions with increased rates of hepatitis B  People with chronic liver disease, kidney disease, HIV infection, or diabetes  Anyone who wants to be protected from hepatitis B There are no known risks to getting hepatitis B vaccine at the same time as other   vaccines. 3. Some people should not get this vaccine Tell the person who is giving the vaccine:  If the person getting the vaccine has any severe, life-threatening allergies. If you ever had a life-threatening allergic reaction after a dose of hepatitis B vaccine, or have a severe allergy to any part of this vaccine, you may be advised not to  get vaccinated. Ask your health care provider if you want information about vaccine components.  If the person getting the vaccine is not feeling well. If you have a mild illness, such as a cold, you can probably get the vaccine today. If you are moderately or severely ill, you should probably wait until you recover. Your doctor can advise you. 4. Risks of a vaccine reaction With any medicine, including vaccines, there is a chance of side effects. These are usually mild and go away on their own, but serious reactions are also possible. Most people who get hepatitis B vaccine do not have any problems with it. Minor problems following hepatitis B vaccine include:  soreness where the shot was given  temperature of 99.9F or higher If these problems occur, they usually begin soon after the shot and last 1 or 2 days. Your doctor can tell you more about these reactions. Other problems that could happen after this vaccine:  People sometimes faint after a medical procedure, including vaccination. Sitting or lying down for about 15 minutes can help prevent fainting and injuries caused by a fall. Tell your provider if you feel dizzy, or have vision changes or ringing in the ears.  Some people get shoulder pain that can be more severe and longer-lasting than the more routine soreness that can follow injections. This happens very rarely.  Any medication can cause a severe allergic reaction. Such reactions from a vaccine are very rare, estimated at about 1 in a million doses, and would happen within a few minutes to a few hours after the vaccination. As with any medicine, there is a very remote chance of a vaccine causing a serious injury or death. The safety of vaccines is always being monitored. For more information, visit: www.cdc.gov/vaccinesafety/ 5. What if there is a serious problem? What should I look for?  Look for anything that concerns you, such as signs of a severe allergic reaction, very  high fever, or unusual behavior. Signs of a severe allergic reaction can include hives, swelling of the face and throat, difficulty breathing, a fast heartbeat, dizziness, and weakness. These would start a few minutes to a few hours after the vaccination. What should I do?  If you think it is a severe allergic reaction or other emergency that can't wait, call 9-1-1 or get to the nearest hospital. Otherwise, call your clinic. Afterward, the reaction should be reported to the Vaccine Adverse Event Reporting System (VAERS). Your doctor should file this report, or you can do it yourself through the VAERS web site at www.vaers.hhs.gov, or by calling 1-800-822-7967. VAERS does not give medical advice. 6. The National Vaccine Injury Compensation Program The National Vaccine Injury Compensation Program (VICP) is a federal program that was created to compensate people who may have been injured by certain vaccines. Persons who believe they may have been injured by a vaccine can learn about the program and about filing a claim by calling 1-800-338-2382 or visiting the VICP website at www.hrsa.gov/vaccinecompensation. There is a time limit to file a claim for compensation. 7. How can I learn more?  Ask your healthcare provider. He or she   can give you the vaccine package insert or suggest other sources of information.  Call your local or state health department.  Contact the Centers for Disease Control and Prevention (CDC):  Call 1-800-232-4636 (1-800-CDC-INFO) or  Visit CDC's website at www.cdc.gov/vaccines CDC Hepatitis B VIS (04/04/2015)   This information is not intended to replace advice given to you by your health care provider. Make sure you discuss any questions you have with your health care provider.   Document Released: 06/26/2006 Document Revised: 05/23/2015 Document Reviewed: 04/21/2015 Elsevier Interactive Patient Education 2016 [employer]  

## 2015-10-05 NOTE — Progress Notes (Signed)
Subjective:    Patient ID: Joseph Terry, male    DOB: 1961-02-16, 55 y.o.   MRN: [id number]  10/05/2015  Follow-up   HPI This 55 y.o. male presents for three month follow-up:   1.  DMII: Patient reports good compliance with medication, good tolerance to medication, and good symptom control.    Not checking sugars.  HgbA1c of 6.7 last visit.  Some Mt. Dews during the holidays.    2. Hyperlipidemia: Patient reports good compliance with medication, good tolerance to medication, and good symptom control.    3.  Depression and anxiety: Patient reports good compliance with medication, good tolerance to medication, and good symptom control.  Doing well.  Work is stable.  Took two weeks at holiday time.  Now work less busy.  When we increased EFFEXOR to 225mg  daily; [facility] giving   4.  CAD: Patient reports good compliance with medication, good tolerance to medication, and good symptom control.  Not checking BP at home.  No dizziness.    Need for Hepatitis B#2.    5. GERD: Patient reports good compliance with medication, good tolerance to medication, and good symptom control.  Did great during the holidays.  [employer] and [employer]; we bumped up Effexor.  [employer] requesting that generic is more expensive than brand name.  Nexium seems to have a quality wher eit feels like burns esphagus.    6.  OSA on CPAP: doing well. .kscomp     Review of Systems  Constitutional: Negative for fever, chills, diaphoresis, activity change, appetite change and fatigue.  Respiratory: Negative for cough and shortness of breath.   Cardiovascular: Negative for chest pain, palpitations and leg swelling.  Gastrointestinal: Negative for nausea, vomiting, abdominal pain and diarrhea.  Endocrine: Negative for cold intolerance, heat intolerance, polydipsia, polyphagia and polyuria.  Skin: Negative for color change, rash and wound.  Neurological: Negative for dizziness, tremors, seizures, syncope, facial asymmetry, speech  difficulty, weakness, light-headedness, numbness and headaches.  Psychiatric/Behavioral: Negative for sleep disturbance and dysphoric mood. The patient is not nervous/anxious.     Past Medical History  Diagnosis Date  . Type II diabetes mellitus ([place])   . GERD (gastroesophageal reflux disease)   . Hypertension   . Hyperlipidemia   . CAD (coronary artery disease), severe multivessel CAD surgical consult for CABG     a. 12/2011 Cath: 3 vessel CAD with normal EF;  b. 12/2011 CABG x 5: LIMA to LAD, SVG to distal RCA with sequential SVG to PDA, SVG to OM, and SVG to D2;  c. 03/2014 ETT: Ex time 6:21, 67mm horizontal ST dep in V3-V6, HTN response.  Marland Kitchen Anxiety state, unspecified   . Impotence of organic origin   . Internal hemorrhoids without mention of complication   . Personal history of colonic polyps   . Diaphragmatic hernia without mention of obstruction or gangrene   . Depression   . OSA on CPAP     a. Sleep study 07/2013 +sever OSA; CPAP at 10cm recommended.  . Kidney stone   . Diverticulosis    Past Surgical History  Procedure Laterality Date  . Rotator cuff curgery  2011    lt rotator cuff  . Cardiac catheterization  4 /19/ 2013    [facility]   . Coronary artery bypass graft  01/05/2012    Procedure: CORONARY ARTERY BYPASS GRAFTING (CABG);  Surgeon: Rexene Alberts, MD;  Location: [facility];  Service: Open Heart Surgery;  Laterality: N/A;  coronary artery bypass graft  times five using left internal mammary artery and right leg greater saphenous vein harvested endoscopically   . Shoulder surgery  1997    bilateral  . Colonoscopy w/ polypectomy  09/16/2007    colon polyps.  Repeat 5 years. Iftikhar.  . Polysomnography  07/2013    +severe OSA; CPAP at 10cm pressure recommended.  . Cholecystectomy  11/03/13  . Colonoscopy  11/01/13    multiple polyps; diverticulosis. Sankar. Repeat 5 years.  . Left heart catheterization with coronary angiogram N/A 01/02/2012    Procedure: LEFT HEART  CATHETERIZATION WITH CORONARY ANGIOGRAM;  Surgeon: Troy Sine, MD;  Location: [facility] CATH LAB;  Service: Cardiovascular;  Laterality: N/A;   Allergies  Allergen Reactions  . Penicillins Itching    tingling  . Tramadol Rash   Current Outpatient Prescriptions  Medication Sig Dispense Refill  . aspirin 325 MG tablet Take 325 mg by mouth daily.    Marland Kitchen atorvastatin (LIPITOR) 40 MG tablet TAKE 1 TABLET (40 MG TOTAL) BY MOUTH DAILY AT 6 PM 30 tablet 11  . canagliflozin (INVOKANA) 300 MG TABS tablet Take 300 mg by mouth daily before breakfast. 30 tablet 11  . clonazePAM (KLONOPIN) 1 MG tablet Take 1 tablet (1 mg total) by mouth 2 (two) times daily. 60 tablet 5  . esomeprazole (NEXIUM) 40 MG capsule Take 1 capsule (40 mg total) by mouth daily. 30 capsule 11  . glipiZIDE (GLUCOTROL XL) 10 MG 24 hr tablet Take 1 tablet (10 mg total) by mouth daily with breakfast. 30 tablet 11  . metFORMIN (GLUCOPHAGE) 1000 MG tablet Take 1 tablet (1,000 mg total) by mouth 2 (two) times daily with a meal. 60 tablet 11  . metoprolol tartrate (LOPRESSOR) 25 MG tablet Take 1 tablet (25 mg total) by mouth 2 (two) times daily 60 tablet 11  . Omega-3 Fatty Acids (FISH OIL) 1200 MG CAPS Take by mouth 2 (two) times daily.    . sitaGLIPtin (JANUVIA) 100 MG tablet TAKE 1 TABLET (100 MG TOTAL) BY MOUTH DAILY. 30 tablet 11  . venlafaxine XR (EFFEXOR-XR) 150 MG 24 hr capsule Take 1 capsule (150 mg total) by mouth daily with breakfast. 30 capsule 11   No current facility-administered medications for this visit.   Social History   Social History  . Marital Status: Divorced    Spouse Name: N/A  . Number of Children: 1  . Years of Education: N/A   Occupational History  . [employer]     x 30 yrs   Social History Main Topics  . Smoking status: Former Smoker -- 1.50 packs/day for 35 years    Types: Cigarettes    Quit date: 11/14/2011  . Smokeless tobacco: Not on file     Comment: quit 2 weeks prior to CABG 12/2011  .  Alcohol Use: 0.6 oz/week    1 Cans of beer per week     Comment: Drinks once per month; beer.  . Drug Use: No  . Sexual Activity:    Partners: Female    Birth Control/ Protection: Condom   Other Topics Concern  . Not on file   Social History Narrative   Marital status: divorced since 1996; dating casually in 2016.        Lives: Lives alone.          Children:  One son and a granddaughter 62 years old live in [place].       Tobacco: quit with CABG      Alcohol: weekends; 6-8  beers on average.  Beers with football; ten on football weekends.      Exercise: Light, yard work.        Employment:  Works 45-50 hrs. Some weeks.  [employer].  Desk work since 05/2013. Since 1979.      Guns:  Guns in the home not stored in locked cabinet. Smoke alarm in home, Always wears seatbelts.       Sexual History: active;Last HIV testing 2015; has has 25+ partners; females only.            Family History  Problem Relation Age of Onset  . Heart attack Mother     defribillator in place  . Hypertension Mother   . Hyperlipidemia Mother   . Heart disease Mother     CHF with defibrillator;  CAD.  . Diabetes Mother   . Cancer Mother 94    Lung cancer  . Hypertension Father   . Hyperlipidemia Father   . Cancer Father 8     prostate cancer s/p  implants  . COPD Father   . Hypertension Sister   . Heart disease Maternal Grandmother   . Heart disease Maternal Grandfather   . Cancer Paternal Grandmother   . Cancer Paternal Grandfather   . Hypertension Sister        Objective:    BP 113/63 mmHg  Pulse 63  Temp(Src) 98 F (36.7 C) (Oral)  Resp 16  Ht 5\' 10"  (1.778 m)  Wt 175 lb 9.6 oz (79.652 kg)  BMI 25.20 kg/m2  SpO2 96% Physical Exam  Constitutional: He is oriented to person, place, and time. He appears well-developed and well-nourished. No distress.  HENT:  Head: Normocephalic and atraumatic.  Right Ear: External ear normal.  Left Ear: External ear normal.  Nose: Nose normal.   Mouth/Throat: Oropharynx is clear and moist.  Eyes: Conjunctivae and EOM are normal. Pupils are equal, round, and reactive to light.  Neck: Normal range of motion. Neck supple. Carotid bruit is not present. No thyromegaly present.  Cardiovascular: Normal rate, regular rhythm, normal heart sounds and intact distal pulses.  Exam reveals no gallop and no friction rub.   No murmur heard. Pulmonary/Chest: Effort normal and breath sounds normal. He has no wheezes. He has no rales.  Abdominal: Soft. Bowel sounds are normal. He exhibits no distension and no mass. There is no tenderness. There is no rebound and no guarding.  Lymphadenopathy:    He has no cervical adenopathy.  Neurological: He is alert and oriented to person, place, and time. No cranial nerve deficit.  Skin: Skin is warm and dry. No rash noted. He is not diaphoretic.  Psychiatric: He has a normal mood and affect. His behavior is normal.  Nursing note and vitals reviewed.  Results for orders placed or performed in visit on 06/21/15  HM DIABETES EYE EXAM  Result Value Ref Range   HM Diabetic Eye Exam No Retinopathy No Retinopathy       Assessment & Plan:   1. S/P CABG x 5   2. Gastroesophageal reflux disease without esophagitis   3. Hyperlipidemia   4. Depression with anxiety   5. OSA on CPAP   6. Barrett's esophagus without dysplasia   7. Coronary artery disease involving coronary bypass graft of native heart without angina pectoris   8. Uncontrolled type 2 diabetes mellitus without complication, without long-term current use of insulin ([place])   9. Need for prophylactic vaccination and inoculation against viral hepatitis  Orders Placed This Encounter  Procedures  . Hepatitis B vaccine adult IM  . CBC with Differential/Platelet  . Comprehensive metabolic panel    Order Specific Question:  Has the patient fasted?    Answer:  Yes  . Hemoglobin A1c  . Lipid panel    Order Specific Question:  Has the patient fasted?     Answer:  Yes   No orders of the defined types were placed in this encounter.    Return in about 3 months (around 01/03/2016) for recheck diabetes.     Elayne Guerin, M.D. [facility] [address] [place], [place]  [address] [phone] phone [phone] fax

## 2015-10-09 LAB — CBC WITH DIFFERENTIAL/PLATELET

## 2015-10-09 LAB — LIPID PANEL
CHOLESTEROL TOTAL: 134 mg/dL (ref 100–199)
Chol/HDL Ratio: 3.4 ratio units (ref 0.0–5.0)
HDL: 40 mg/dL (ref >39)
LDL Calculated: 59 mg/dL (ref 0–99)
Triglycerides: 177 mg/dL — ABNORMAL HIGH (ref 0–149)
VLDL Cholesterol Cal: 35 mg/dL (ref 5–40)

## 2015-10-09 LAB — HEMOGLOBIN A1C
ESTIMATED AVERAGE GLUCOSE: 148 mg/dL
Hgb A1c MFr Bld: 6.8 % — ABNORMAL HIGH (ref 4.8–5.6)

## 2015-10-09 LAB — COMPREHENSIVE METABOLIC PANEL
A/G RATIO: 1.6 (ref 1.1–2.5)
ALT: 29 IU/L (ref 0–44)
AST: 19 IU/L (ref 0–40)
Albumin: 4.6 g/dL (ref 3.5–5.5)
Alkaline Phosphatase: 81 IU/L (ref 39–117)
BUN/Creatinine Ratio: 19 (ref 9–20)
BUN: 13 mg/dL (ref 6–24)
Bilirubin Total: 0.3 mg/dL (ref 0.0–1.2)
CALCIUM: 9.7 mg/dL (ref 8.7–10.2)
CO2: 24 mmol/L (ref 18–29)
Chloride: 99 mmol/L (ref 96–106)
Creatinine, Ser: 0.7 mg/dL — ABNORMAL LOW (ref 0.76–1.27)
GFR, EST AFRICAN AMERICAN: 122 mL/min/{1.73_m2} (ref >59)
GFR, EST NON AFRICAN AMERICAN: 105 mL/min/{1.73_m2} (ref >59)
GLOBULIN, TOTAL: 2.8 g/dL (ref 1.5–4.5)
Glucose: 75 mg/dL (ref 65–99)
POTASSIUM: 5.4 mmol/L — AB (ref 3.5–5.2)
SODIUM: 140 mmol/L (ref 134–144)
TOTAL PROTEIN: 7.4 g/dL (ref 6.0–8.5)

## 2015-10-15 ENCOUNTER — Telehealth: Payer: Self-pay

## 2015-10-15 MED ORDER — METOPROLOL TARTRATE 25 MG PO TABS: ORAL_TABLET | ORAL | Status: DC

## 2015-10-15 NOTE — Telephone Encounter (Signed)
Sent in Metoprolol. Pt notified.

## 2015-10-15 NOTE — Telephone Encounter (Signed)
Pt is needing to talk with dr Tamala Julian about getting a new generic rx insurance does not cover the generic nexium any longer, and pt needs a refill on metoprolol  Best number [phone]

## 2015-11-05 ENCOUNTER — Encounter: Payer: Self-pay | Admitting: Family Medicine

## 2015-11-05 NOTE — Telephone Encounter (Signed)
Pamala Hurry do you know anything about this message or previous messages?  Can we research this on behalf of the patient?  I see where Metoprolol was sent in by [facility] on 10/15/15.  Jonelle Sidle, did you research the Nexium coverage for the patient?

## 2015-11-12 ENCOUNTER — Telehealth: Payer: Self-pay

## 2015-11-12 NOTE — Telephone Encounter (Signed)
I spoke to pt and completed PA for esomeprazole on covermymeds. See notes under telephone message 2/27.

## 2015-11-12 NOTE — Telephone Encounter (Signed)
Pt called to check status of PA for esomeprazole. I had not received any info from the pharmacy about a PA, and had not seen the pt email forwarded by Dr Tamala Julian yet. I got the following info from pt that he had tried/failed omeprazole, Prevacid, Pepcid and Zantac OTC prior to being Rxd Nexium. I completed PA on covermymeds and asked for expedited review. Pending.

## 2015-11-13 NOTE — Telephone Encounter (Signed)
PA was denied. Reason for denial was "Requests for prescription drugs that have over-the-counter alternatives where the prescription drug category is therapeutically equivalent to an over-the-counter drug is not covered per your benefits booklet or plan documents". Since pt originally req'd this to be done through a [employer], I notified him on Mychart.

## 2015-11-26 ENCOUNTER — Ambulatory Visit (INDEPENDENT_AMBULATORY_CARE_PROVIDER_SITE_OTHER): Payer: BLUE CROSS/BLUE SHIELD | Admitting: Cardiovascular Disease

## 2015-11-26 ENCOUNTER — Encounter: Payer: Self-pay | Admitting: Cardiovascular Disease

## 2015-11-26 VITALS — BP 108/62 | HR 71 | Ht 70.0 in | Wt 175.5 lb

## 2015-11-26 DIAGNOSIS — E785 Hyperlipidemia, unspecified: Secondary | ICD-10-CM

## 2015-11-26 NOTE — Patient Instructions (Addendum)
Medication Instructions:  Your physician recommends that you continue on your current medications as directed. Please refer to the Current Medication list given to you today.   Labwork: none  Testing/Procedures: Your physician has requested that you have an exercise tolerance test. For further information please visit [email or web address]. Please also follow instruction sheet, as given.  DO NOT TAKE METOPROLOL THE MORNING OF YOUR TEST.    Follow-Up: Your physician wants you to follow-up in: one year with Dr. Fletcher Anon.  You will receive a reminder letter in the mail two months in advance. If you don't receive a letter, please call our office to schedule the follow-up appointment.   Any Other Special Instructions Will Be Listed Below (If Applicable).     If you need a refill on your cardiac medications before your next appointment, please call your pharmacy.  Exercise Stress Electrocardiogram An exercise stress electrocardiogram is a test that is done to evaluate the blood supply to your heart. This test may also be called exercise stress electrocardiography. The test is done while you are walking on a treadmill. The goal of this test is to raise your heart rate. This test is done to find areas of poor blood flow to the heart by determining the extent of coronary artery disease (CAD).   CAD is defined as narrowing in one or more heart (coronary) arteries of more than 70%. If you have an abnormal test result, this may mean that you are not getting adequate blood flow to your heart during exercise. Additional testing may be needed to understand why your test was abnormal. LET [facility] CARE PROVIDER KNOW ABOUT:   Any allergies you have.  All medicines you are taking, including vitamins, herbs, eye drops, creams, and over-the-counter medicines.  Previous problems you or members of your family have had with the use of anesthetics.  Any blood disorders you have.  Previous surgeries you  have had.  Medical conditions you have.  Possibility of pregnancy, if this applies. RISKS AND COMPLICATIONS Generally, this is a safe procedure. However, as with any procedure, complications can occur. Possible complications can include:  Pain or pressure in the following areas:  Chest.  Jaw or neck.  Between your shoulder blades.  Radiating down your left arm.  Dizziness or light-headedness.  Shortness of breath.  Increased or irregular heartbeats.  Nausea or vomiting.  Heart attack (rare). BEFORE THE PROCEDURE  Avoid all forms of caffeine 24 hours before your test or as directed by your health care provider. This includes coffee, tea (even decaffeinated tea), caffeinated sodas, chocolate, cocoa, and certain pain medicines.  Follow your health care provider's instructions regarding eating and drinking before the test.  Take your medicines as directed at regular times with water unless instructed otherwise. Exceptions may include:  If you have diabetes, ask how you are to take your insulin or pills. It is common to adjust insulin dosing the morning of the test.  If you are taking beta-blocker medicines, it is important to talk to your health care provider about these medicines well before the date of your test. Taking beta-blocker medicines may interfere with the test. In some cases, these medicines need to be changed or stopped 24 hours or more before the test.  If you wear a nitroglycerin patch, it may need to be removed prior to the test. Ask your health care provider if the patch should be removed before the test.  If you use an inhaler for any breathing condition, bring it  with you to the test.  If you are an outpatient, bring a snack so you can eat right after the stress phase of the test.  Do not smoke for 4 hours prior to the test or as directed by your health care provider.  Do not apply lotions, powders, creams, or oils on your chest prior to the test.  Wear  loose-fitting clothes and comfortable shoes for the test. This test involves walking on a treadmill. PROCEDURE  Multiple patches (electrodes) will be put on your chest. If needed, small areas of your chest may have to be shaved to get better contact with the electrodes. Once the electrodes are attached to your body, multiple wires will be attached to the electrodes and your heart rate will be monitored.  Your heart will be monitored both at rest and while exercising.  You will walk on a treadmill. The treadmill will be started at a slow pace. The treadmill speed and incline will gradually be increased to raise your heart rate. AFTER THE PROCEDURE  Your heart rate and blood pressure will be monitored after the test.  You may return to your normal schedule including diet, activities, and medicines, unless your health care provider tells you otherwise.   This information is not intended to replace advice given to you by your health care provider. Make sure you discuss any questions you have with your health care provider.   Document Released: 08/29/2000 Document Revised: 09/06/2013 Document Reviewed: 05/09/2013 Elsevier Interactive Patient Education [employer].

## 2015-11-26 NOTE — Progress Notes (Signed)
Cardiology Office Note   Date:  11/26/2015   ID:  Joseph Terry, DOB 1961-05-16, MRN [id number]  PCP:  Joseph Forts, MD  Cardiologist:   Joseph Sacramento, MD   Chief Complaint  Patient presents with  . other    12 month follow up.. Pt. had a Myoview in July 2016. Meds reviewed by the patient verbally. "doing well."       History of Present Illness: Joseph Terry is a 55 y.o. male who presents for a followup visit regarding coronary artery disease status post CABG x5 in April 2013.  He has chronic medical conditions that include type 2 diabetes, sleep apnea, hypertension, hyperlipidemia and depression /anxiety,  and previous tobacco use. He had anemia few months after the surgery which resolved completely after treatment with iron supplementation. He has been doing and denies any chest pain or significant shortness of breath. He has a Insurance account manager. Most recent treadmill stress test in July 2016 showed hypertensive response to exercise with no evidence of ischemia. Diabetes control has improved significantly over the last year with a most recent hemoglobin A1c of 6.7.   Past Medical History  Diagnosis Date  . Type II diabetes mellitus ([place])   . GERD (gastroesophageal reflux disease)   . Hypertension   . Hyperlipidemia   . CAD (coronary artery disease), severe multivessel CAD surgical consult for CABG     a. 12/2011 Cath: 3 vessel CAD with normal EF;  b. 12/2011 CABG x 5: LIMA to LAD, SVG to distal RCA with sequential SVG to PDA, SVG to OM, and SVG to D2;  c. 03/2014 ETT: Ex time 6:21, 50mm horizontal ST dep in V3-V6, HTN response.  Marland Kitchen Anxiety state, unspecified   . Impotence of organic origin   . Internal hemorrhoids without mention of complication   . Personal history of colonic polyps   . Diaphragmatic hernia without mention of obstruction or gangrene   . Depression   . OSA on CPAP     a. Sleep study 07/2013 +sever OSA; CPAP at 10cm recommended.  . Kidney stone    . Diverticulosis     Past Surgical History  Procedure Laterality Date  . Rotator cuff curgery  2011    lt rotator cuff  . Cardiac catheterization  4 /19/ 2013       . Coronary artery bypass graft  01/05/2012    Procedure: CORONARY ARTERY BYPASS GRAFTING (CABG);  Surgeon: Rexene Alberts, MD;  Location: [facility];  Service: Open Heart Surgery;  Laterality: N/A;  coronary artery bypass graft times five using left internal mammary artery and right leg greater saphenous vein harvested endoscopically   . Shoulder surgery  1997    bilateral  . Colonoscopy w/ polypectomy  09/16/2007    colon polyps.  Repeat 5 years. Iftikhar.  . Polysomnography  07/2013    +severe OSA; CPAP at 10cm pressure recommended.  . Cholecystectomy  11/03/13  . Colonoscopy  11/01/13    multiple polyps; diverticulosis. Sankar. Repeat 5 years.  . Left heart catheterization with coronary angiogram N/A 01/02/2012    Procedure: LEFT HEART CATHETERIZATION WITH CORONARY ANGIOGRAM;  Surgeon: Troy Sine, MD;  Location: [facility] CATH LAB;  Service: Cardiovascular;  Laterality: N/A;     Current Outpatient Prescriptions  Medication Sig Dispense Refill  . aspirin 81 MG tablet Take 81 mg by mouth daily.    Marland Kitchen atorvastatin (LIPITOR) 40 MG tablet TAKE 1 TABLET (40 MG TOTAL) BY MOUTH DAILY  AT 6 PM 30 tablet 11  . canagliflozin (INVOKANA) 300 MG TABS tablet Take 300 mg by mouth daily before breakfast. 30 tablet 11  . clonazePAM (KLONOPIN) 1 MG tablet Take 1 tablet (1 mg total) by mouth 2 (two) times daily. 60 tablet 5  . glipiZIDE (GLUCOTROL XL) 10 MG 24 hr tablet Take 1 tablet (10 mg total) by mouth daily with breakfast. 30 tablet 11  . metFORMIN (GLUCOPHAGE) 1000 MG tablet Take 1 tablet (1,000 mg total) by mouth 2 (two) times daily with a meal. 60 tablet 11  . metoprolol tartrate (LOPRESSOR) 25 MG tablet Take 1 tablet (25 mg total) by mouth 2 (two) times daily 60 tablet 5  . Omega-3 Fatty Acids (FISH OIL) 1200 MG CAPS Take by  mouth 2 (two) times daily.    . sitaGLIPtin (JANUVIA) 100 MG tablet TAKE 1 TABLET (100 MG TOTAL) BY MOUTH DAILY. 30 tablet 11  . venlafaxine XR (EFFEXOR-XR) 150 MG 24 hr capsule Take 1 capsule (150 mg total) by mouth daily with breakfast. 30 capsule 11   No current facility-administered medications for this visit.    Allergies:   Penicillins and Tramadol    Social History:  The patient  reports that he quit smoking about 4 years ago. His smoking use included Cigarettes. He has a 52.5 pack-year smoking history. He does not have any smokeless tobacco history on file. He reports that he drinks about 0.6 oz of alcohol per week. He reports that he does not use illicit drugs.   Family History:  The patient's family history includes COPD in his father; Cancer in his paternal grandfather and paternal grandmother; Cancer (age of onset: 67) in his father; Cancer (age of onset: 70) in his mother; Diabetes in his mother; Heart attack in his mother; Heart disease in his maternal grandfather, maternal grandmother, and mother; Hyperlipidemia in his father and mother; Hypertension in his father, mother, sister, and sister.    ROS:  Please see the history of present illness.   Otherwise, review of systems are positive for none.   All other systems are reviewed and negative.    PHYSICAL EXAM: VS:  BP 108/62 mmHg  Pulse 71  Ht 5\' 10"  (1.778 m)  Wt 175 lb 8 oz (79.606 kg)  BMI 25.18 kg/m2 , BMI Body mass index is 25.18 kg/(m^2). GEN: Well nourished, well developed, in no acute distress HEENT: normal Neck: no JVD, carotid bruits, or masses Cardiac: RRR; no murmurs, rubs, or gallops,no edema  Respiratory:  clear to auscultation bilaterally, normal work of breathing GI: soft, nontender, nondistended, + BS MS: no deformity or atrophy Skin: warm and dry, no rash Neuro:  Strength and sensation are intact Psych: euthymic mood, full affect   EKG:  EKG is not ordered today.    Recent Labs: 06/18/2015:  Hemoglobin 12.9*; TSH 1.133 10/05/2015: ALT 29; BUN 13; Creatinine, Ser 0.70*; Platelets CANCELED; Potassium 5.4*; Sodium 140    Lipid Panel    Component Value Date/Time   CHOL 134 10/05/2015 1020   CHOL 136 08/14/2014 0905   TRIG 177* 10/05/2015 1020   HDL 40 10/05/2015 1020   HDL 29* 08/14/2014 0905   CHOLHDL 3.4 10/05/2015 1020   CHOLHDL 4.7 08/14/2014 0905   VLDL NOT CALC 08/14/2014 0905   LDLCALC 59 10/05/2015 1020   LDLCALC NOT CALC 08/14/2014 0905      Wt Readings from Last 3 Encounters:  11/26/15 175 lb 8 oz (79.606 kg)  10/05/15 175 lb 9.6 oz (  79.652 kg)  06/18/15 172 lb 12.8 oz (78.382 kg)         ASSESSMENT AND PLAN:  1.  Coronary artery disease involving native coronary arteries without angina: Status post CABG in 2013. He is doing very well with no symptoms suggestive of angina. 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  2. Hyperlipidemia: Continue treatment with atorvastatin 40 mg once daily. Most recent LDL was 59 which is at target.  3. Essential hypertension: Blood pressure is well controlled on metoprolol.  4. Type 2 diabetes: Significant improvement and hemoglobin A1c over the last year.      Disposition:   FU with me in 1 year  Signed,  Joseph Sacramento, MD  11/26/2015 8:30 AM    [place]

## 2015-12-05 ENCOUNTER — Telehealth: Payer: Self-pay

## 2015-12-05 NOTE — Telephone Encounter (Signed)
[employer] [facility] [place] called to say that the medication refill request for  Esomeprazole magnesium has been denied due to it not being medically necessary for patient to have this medicine. She states that two alternative medicines must be tried and we have only tried one of them. She states that a pier to pier can be done over the phone by calling [phone] ext [phone]. She states that we can also submit additional clinical notes by faxing them into [phone] this is a 7 day process. The patient's REF # is [id number].

## 2015-12-06 NOTE — Telephone Encounter (Signed)
I already sent message to pt, see notes under 2/27 ph mess, that this is not covered bc it is now available OTC.

## 2015-12-09 ENCOUNTER — Other Ambulatory Visit: Payer: Self-pay | Admitting: Physician Assistant

## 2015-12-13 ENCOUNTER — Ambulatory Visit: Payer: Self-pay | Admitting: Physician Assistant

## 2015-12-18 DIAGNOSIS — G4733 Obstructive sleep apnea (adult) (pediatric): Secondary | ICD-10-CM | POA: Diagnosis not present

## 2015-12-19 ENCOUNTER — Encounter: Payer: Self-pay | Admitting: Family Medicine

## 2015-12-20 DIAGNOSIS — H524 Presbyopia: Secondary | ICD-10-CM | POA: Diagnosis not present

## 2015-12-20 DIAGNOSIS — H5210 Myopia, unspecified eye: Secondary | ICD-10-CM | POA: Diagnosis not present

## 2016-01-04 ENCOUNTER — Ambulatory Visit: Payer: Self-pay | Admitting: Physician Assistant

## 2016-01-07 ENCOUNTER — Ambulatory Visit (INDEPENDENT_AMBULATORY_CARE_PROVIDER_SITE_OTHER): Payer: BLUE CROSS/BLUE SHIELD | Admitting: Physician Assistant

## 2016-01-07 ENCOUNTER — Encounter: Payer: Self-pay | Admitting: Physician Assistant

## 2016-01-07 VITALS — BP 140/80 | HR 73 | Temp 98.1°F | Resp 16 | Wt 173.4 lb

## 2016-01-07 DIAGNOSIS — E785 Hyperlipidemia, unspecified: Secondary | ICD-10-CM | POA: Diagnosis not present

## 2016-01-07 DIAGNOSIS — F418 Other specified anxiety disorders: Secondary | ICD-10-CM | POA: Diagnosis not present

## 2016-01-07 DIAGNOSIS — E1165 Type 2 diabetes mellitus with hyperglycemia: Secondary | ICD-10-CM

## 2016-01-07 DIAGNOSIS — I10 Essential (primary) hypertension: Secondary | ICD-10-CM | POA: Diagnosis not present

## 2016-01-07 DIAGNOSIS — IMO0001 Reserved for inherently not codable concepts without codable children: Secondary | ICD-10-CM

## 2016-01-07 DIAGNOSIS — D509 Iron deficiency anemia, unspecified: Secondary | ICD-10-CM | POA: Diagnosis not present

## 2016-01-07 MED ORDER — CLONAZEPAM 1 MG PO TABS: 1.0000 mg | ORAL_TABLET | Freq: Two times a day (BID) | ORAL | Status: DC

## 2016-01-07 NOTE — Patient Instructions (Signed)

## 2016-01-07 NOTE — Progress Notes (Signed)
Patient: Joseph Terry Male    DOB: 1961/01/13   55 y.o.   MRN: [id number] Visit Date: 01/07/2016  Today's Provider: Mar Daring, PA-C   Chief Complaint  Patient presents with  . Medication Refill   Subjective:    HPI Joseph Terry is here for medication refills on his clonazepam today as well. Previously followed by Dr. Reginia Forts but states he would like to come back here so as not to drive to [facility] for f/u.   Diabetes Mellitus Type II, Follow-up:   Lab Results  Component Value Date   HGBA1C 6.8* 10/05/2015   HGBA1C 6.8* 06/18/2015   HGBA1C 7.5* 03/07/2015    Last seen for diabetes 3 months ago.  Management since then includes none. He reports good compliance with treatment. He is not having side effects.  Current symptoms include none and have been stable. Home blood sugar records: n/a Episodes of hypoglycemia? no   Current Insulin Regimen: glipizide,Metformin,Januvia, Invokana. Most Recent Eye Exam: per patient 2 weeks ago Weight trend: stable Prior visit with dietician: no Current diet: in general, a "healthy" diet   Current exercise: none  Pertinent Labs:    Component Value Date/Time   CHOL 134 10/05/2015 1020   CHOL 136 08/14/2014 0905   TRIG 177* 10/05/2015 1020   HDL 40 10/05/2015 1020   HDL 29* 08/14/2014 0905   LDLCALC 59 10/05/2015 1020   LDLCALC NOT CALC 08/14/2014 0905   CREATININE 0.70* 10/05/2015 1020   CREATININE 0.76 06/18/2015 0821    Wt Readings from Last 3 Encounters:  01/07/16 173 lb 6.4 oz (78.654 kg)  11/26/15 175 lb 8 oz (79.606 kg)  10/05/15 175 lb 9.6 oz (79.652 kg)    ------------------------------------------------------------------------   Lipid/Cholesterol, Follow-up:   Last seen for this3 months ago.  Management changes since that visit include n/a. Marland Kitchen Last Lipid Panel:    Component Value Date/Time   CHOL 134 10/05/2015 1020   CHOL 136 08/14/2014 0905   TRIG 177* 10/05/2015 1020   HDL 40  10/05/2015 1020   HDL 29* 08/14/2014 0905   CHOLHDL 3.4 10/05/2015 1020   CHOLHDL 4.7 08/14/2014 0905   VLDL NOT CALC 08/14/2014 0905   LDLCALC 59 10/05/2015 1020   LDLCALC NOT CALC 08/14/2014 0905    Risk factors for vascular disease include diabetes mellitus and hypertension  He reports excellent compliance with treatment. He is not having side effects.  Current symptoms include none and have been stable. Weight trend: stable Prior visit with dietician: no Current diet: in general, a "healthy" diet   Current exercise: none  Wt Readings from Last 3 Encounters:  01/07/16 173 lb 6.4 oz (78.654 kg)  11/26/15 175 lb 8 oz (79.606 kg)  10/05/15 175 lb 9.6 oz (79.652 kg)    -------------------------------------------------------------------      Allergies  Allergen Reactions  . Penicillins Itching    tingling  . Tramadol Rash   Previous Medications   ASPIRIN 81 MG TABLET    Take 81 mg by mouth daily.   ATORVASTATIN (LIPITOR) 40 MG TABLET    TAKE 1 TABLET (40 MG TOTAL) BY MOUTH DAILY AT 6 PM   CANAGLIFLOZIN (INVOKANA) 300 MG TABS TABLET    Take 300 mg by mouth daily before breakfast.   CLONAZEPAM (KLONOPIN) 1 MG TABLET    Take 1 tablet (1 mg total) by mouth 2 (two) times daily.   GLIPIZIDE (GLUCOTROL XL) 10 MG 24 HR TABLET  Take 1 tablet (10 mg total) by mouth daily with breakfast.   METFORMIN (GLUCOPHAGE) 1000 MG TABLET    TAKE 1 TABLET (1,000 MG TOTAL) BY MOUTH 2 (TWO) TIMES DAILY WITH A MEAL.   METOPROLOL TARTRATE (LOPRESSOR) 25 MG TABLET    Take 1 tablet (25 mg total) by mouth 2 (two) times daily   OMEGA-3 FATTY ACIDS (FISH OIL) 1200 MG CAPS    Take by mouth 2 (two) times daily.   SITAGLIPTIN (JANUVIA) 100 MG TABLET    TAKE 1 TABLET (100 MG TOTAL) BY MOUTH DAILY.   VENLAFAXINE XR (EFFEXOR-XR) 150 MG 24 HR CAPSULE    Take 1 capsule (150 mg total) by mouth daily with breakfast.    Review of Systems  Constitutional: Negative.   Eyes: Negative for visual disturbance.    Respiratory: Negative for cough, chest tightness and shortness of breath.   Cardiovascular: Negative for chest pain, palpitations and leg swelling.  Gastrointestinal: Negative for vomiting, abdominal pain, diarrhea and constipation.  Endocrine: Positive for polydipsia. Negative for polyuria.  Genitourinary: Negative for frequency.  Neurological: Positive for headaches. Negative for dizziness and light-headedness.  Psychiatric/Behavioral: Negative for suicidal ideas and sleep disturbance. The patient is not nervous/anxious.     Social History  Substance Use Topics  . Smoking status: Former Smoker -- 1.50 packs/day for 35 years    Types: Cigarettes    Quit date: 11/14/2011  . Smokeless tobacco: Not on file     Comment: quit 2 weeks prior to CABG 12/2011  . Alcohol Use: 0.6 oz/week    1 Cans of beer per week     Comment: Drinks once per month; beer.   Objective:   BP 140/80 mmHg  Pulse 73  Temp(Src) 98.1 F (36.7 C) (Oral)  Resp 16  Wt 173 lb 6.4 oz (78.654 kg)  Physical Exam  Constitutional: He appears well-developed and well-nourished. No distress.  HENT:  Head: Normocephalic and atraumatic.  Neck: Normal range of motion. Neck supple. No JVD present. No tracheal deviation present. No thyromegaly present.  Cardiovascular: Normal rate, regular rhythm and normal heart sounds.  Exam reveals no gallop and no friction rub.   No murmur heard. Pulmonary/Chest: Effort normal and breath sounds normal. No respiratory distress. He has no wheezes. He has no rales.  Lymphadenopathy:    He has no cervical adenopathy.  Skin: He is not diaphoretic.  Vitals reviewed.       Assessment & Plan:     1. Depression with anxiety Stable. Diagnosis pulled for medication refill. Continue current medical treatment plan. - clonazePAM (KLONOPIN) 1 MG tablet; Take 1 tablet (1 mg total) by mouth 2 (two) times daily.  Dispense: 60 tablet; Refill: 5  2. Essential hypertension Stable. Will check labs  as below and f/u pending lab results. If stable will see him back in 3 months. CPE due in October 2017. - CBC with Differential - Comprehensive Metabolic Panel (CMET)  3. Uncontrolled type 2 diabetes mellitus without complication, without long-term current use of insulin (HCC) Stable. Will check labs as below and f/u pending lab results. If stable will see him back in 3 months. CPE due in October 2017. - Comprehensive Metabolic Panel (CMET) - HgB A1c  4. Iron deficiency anemia Stable. Will check labs as below and f/u pending lab results. If stable will see him back in 3 months. CPE due in October 2017. - CBC with Differential - Comprehensive Metabolic Panel (CMET)  5. Hyperlipidemia Stable. Will check labs as  below and f/u pending lab results. If stable will see him back in 3 months. CPE due in October 2017. - [place], PA-C  [facility]

## 2016-01-08 ENCOUNTER — Telehealth: Payer: Self-pay

## 2016-01-08 LAB — COMPREHENSIVE METABOLIC PANEL
A/G RATIO: 1.6 (ref 1.2–2.2)
ALK PHOS: 83 IU/L (ref 39–117)
ALT: 34 IU/L (ref 0–44)
AST: 30 IU/L (ref 0–40)
Albumin: 5 g/dL (ref 3.5–5.5)
BILIRUBIN TOTAL: 0.4 mg/dL (ref 0.0–1.2)
BUN / CREAT RATIO: 13 (ref 9–20)
BUN: 10 mg/dL (ref 6–24)
CHLORIDE: 95 mmol/L — AB (ref 96–106)
CO2: 24 mmol/L (ref 18–29)
Calcium: 10.5 mg/dL — ABNORMAL HIGH (ref 8.7–10.2)
Creatinine, Ser: 0.79 mg/dL (ref 0.76–1.27)
GFR calc Af Amer: 116 mL/min/{1.73_m2} (ref >59)
GFR calc non Af Amer: 100 mL/min/{1.73_m2} (ref >59)
GLOBULIN, TOTAL: 3.2 g/dL (ref 1.5–4.5)
Glucose: 121 mg/dL — ABNORMAL HIGH (ref 65–99)
POTASSIUM: 5.6 mmol/L — AB (ref 3.5–5.2)
SODIUM: 137 mmol/L (ref 134–144)
Total Protein: 8.2 g/dL (ref 6.0–8.5)

## 2016-01-08 LAB — CBC WITH DIFFERENTIAL/PLATELET
BASOS ABS: 0.1 10*3/uL (ref 0.0–0.2)
BASOS: 1 %
EOS (ABSOLUTE): 0.4 10*3/uL (ref 0.0–0.4)
Eos: 5 %
Hematocrit: 44.3 % (ref 37.5–51.0)
Hemoglobin: 13.7 g/dL (ref 12.6–17.7)
Immature Grans (Abs): 0 10*3/uL (ref 0.0–0.1)
Immature Granulocytes: 0 %
Lymphocytes Absolute: 1.5 10*3/uL (ref 0.7–3.1)
Lymphs: 21 %
MCH: 24.2 pg — AB (ref 26.6–33.0)
MCHC: 30.9 g/dL — AB (ref 31.5–35.7)
MCV: 78 fL — AB (ref 79–97)
MONOS ABS: 0.5 10*3/uL (ref 0.1–0.9)
Monocytes: 7 %
NEUTROS ABS: 4.7 10*3/uL (ref 1.4–7.0)
NEUTROS PCT: 66 %
Platelets: 414 10*3/uL — ABNORMAL HIGH (ref 150–379)
RBC: 5.66 x10E6/uL (ref 4.14–5.80)
RDW: 18.3 % — AB (ref 12.3–15.4)
WBC: 7.2 10*3/uL (ref 3.4–10.8)

## 2016-01-08 LAB — LIPID PANEL
CHOLESTEROL TOTAL: 127 mg/dL (ref 100–199)
Chol/HDL Ratio: 3.5 ratio units (ref 0.0–5.0)
HDL: 36 mg/dL — ABNORMAL LOW (ref >39)
LDL Calculated: 58 mg/dL (ref 0–99)
TRIGLYCERIDES: 165 mg/dL — AB (ref 0–149)
VLDL Cholesterol Cal: 33 mg/dL (ref 5–40)

## 2016-01-08 LAB — HEMOGLOBIN A1C
ESTIMATED AVERAGE GLUCOSE: 148 mg/dL
Hgb A1c MFr Bld: 6.8 % — ABNORMAL HIGH (ref 4.8–5.6)

## 2016-01-08 NOTE — Telephone Encounter (Signed)
Patient advised as directed below.  Thanks,  -Joseline 

## 2016-01-08 NOTE — Telephone Encounter (Signed)
-----   Message from Mar Daring, PA-C sent at 01/08/2016  8:49 AM EDT ----- Anemia is stable. Hemoglobin A1c is stable at 6.8. Cholesterol is stable. Slight continued improvement in triglycerides. Potassium and calcium are both elevated slightly. Discontinue any multi-vitamins with potassium or calcium. Watch foods high in potassium. No medicines you are currently on prescription wise cause increased potassium. Will recheck all in 3 months again as discussed. Call if any issues before then.

## 2016-01-22 ENCOUNTER — Ambulatory Visit: Payer: BLUE CROSS/BLUE SHIELD | Admitting: Physician Assistant

## 2016-02-03 ENCOUNTER — Other Ambulatory Visit: Payer: Self-pay | Admitting: Family Medicine

## 2016-02-15 DIAGNOSIS — M25561 Pain in right knee: Secondary | ICD-10-CM | POA: Diagnosis not present

## 2016-02-15 DIAGNOSIS — S8391XA Sprain of unspecified site of right knee, initial encounter: Secondary | ICD-10-CM | POA: Diagnosis not present

## 2016-02-19 ENCOUNTER — Other Ambulatory Visit: Payer: Self-pay | Admitting: Physician Assistant

## 2016-02-19 DIAGNOSIS — E119 Type 2 diabetes mellitus without complications: Secondary | ICD-10-CM

## 2016-02-19 NOTE — Telephone Encounter (Signed)
Pt needs refill on his metFORMIN (GLUCOPHAGE) 1000 MG tablet  [facility] uses [facility] in [facility]  Thank s Con Memos

## 2016-02-20 MED ORDER — METFORMIN HCL 1000 MG PO TABS: ORAL_TABLET | ORAL | Status: DC

## 2016-03-04 DIAGNOSIS — K090 Developmental odontogenic cysts: Secondary | ICD-10-CM | POA: Diagnosis not present

## 2016-03-14 DIAGNOSIS — K090 Developmental odontogenic cysts: Secondary | ICD-10-CM | POA: Diagnosis not present

## 2016-03-20 ENCOUNTER — Other Ambulatory Visit: Payer: Self-pay

## 2016-03-20 DIAGNOSIS — Z951 Presence of aortocoronary bypass graft: Secondary | ICD-10-CM

## 2016-03-23 ENCOUNTER — Other Ambulatory Visit: Payer: Self-pay | Admitting: Family Medicine

## 2016-03-25 ENCOUNTER — Encounter: Payer: Self-pay | Admitting: Physician Assistant

## 2016-03-25 ENCOUNTER — Ambulatory Visit (INDEPENDENT_AMBULATORY_CARE_PROVIDER_SITE_OTHER): Payer: BLUE CROSS/BLUE SHIELD | Admitting: Physician Assistant

## 2016-03-25 VITALS — BP 110/62 | HR 64 | Temp 98.0°F | Resp 16 | Ht 70.0 in | Wt 171.0 lb

## 2016-03-25 DIAGNOSIS — E785 Hyperlipidemia, unspecified: Secondary | ICD-10-CM | POA: Diagnosis not present

## 2016-03-25 DIAGNOSIS — E119 Type 2 diabetes mellitus without complications: Secondary | ICD-10-CM | POA: Diagnosis not present

## 2016-03-25 DIAGNOSIS — Z126 Encounter for screening for malignant neoplasm of bladder: Secondary | ICD-10-CM

## 2016-03-25 DIAGNOSIS — Z0000 Encounter for general adult medical examination without abnormal findings: Secondary | ICD-10-CM | POA: Diagnosis not present

## 2016-03-25 DIAGNOSIS — I10 Essential (primary) hypertension: Secondary | ICD-10-CM | POA: Diagnosis not present

## 2016-03-25 DIAGNOSIS — Z125 Encounter for screening for malignant neoplasm of prostate: Secondary | ICD-10-CM

## 2016-03-25 LAB — POCT URINALYSIS DIPSTICK
BILIRUBIN UA: NEGATIVE
Blood, UA: NEGATIVE
Glucose, UA: 2000
KETONES UA: NEGATIVE
LEUKOCYTES UA: NEGATIVE
Nitrite, UA: NEGATIVE
PH UA: 6
Protein, UA: NEGATIVE
SPEC GRAV UA: 1.02
Urobilinogen, UA: 0.2

## 2016-03-25 LAB — POCT UA - MICROALBUMIN: Microalbumin Ur, POC: <20 mg/L

## 2016-03-25 NOTE — Progress Notes (Signed)
Patient: Joseph Terry, Male    DOB: 1961-05-15, 55 y.o.   MRN: [id number] Visit Date: 03/25/2016  Today's Provider: Mar Daring, PA-C   Chief Complaint  Patient presents with  . Annual Exam   Subjective:    Annual physical exam OMAN MOSEY is a 55 y.o. male who presents today for health maintenance and complete physical. He feels fairly well. He reports exercising never. He reports he is sleeping poorly due to recent oral surgery (wisdom teeth extraction).  Last colonoscopy- 11/01/2013- hyperplastic rectal polyps. Diverticulosis. Repeat 5 years per Dr. Jamal Collin. Last labs checked 01/07/2016. Stable. Potassium and calcium elevated. Recheck 3 months. Diabetic retinopathy exam: 12/20/15 Dr. Norton Pastel -----------------------------------------------------------------   Review of Systems  Constitutional: Negative.   HENT: Positive for dental problem (secondary to wisdom tooth extraction with cysts) and ear pain.   Eyes: Negative.   Respiratory: Negative.   Cardiovascular: Negative.   Gastrointestinal: Negative.   Endocrine: Negative.   Genitourinary: Negative.   Musculoskeletal: Negative.   Skin: Negative.   Allergic/Immunologic: Negative.   Neurological: Positive for headaches.  Hematological: Negative.   Psychiatric/Behavioral: Negative.     Social History      He  reports that he quit smoking about 4 years ago. His smoking use included Cigarettes. He has a 52.5 pack-year smoking history. He has never used smokeless tobacco. He reports that he drinks about 0.6 oz of alcohol per week. He reports that he does not use illicit drugs.       Social History   Social History  . Marital Status: Divorced    Spouse Name: N/A  . Number of Children: 1  . Years of Education: 12   Occupational History  . [employer]     x 30 yrs   Social History Main Topics  . Smoking status: Former Smoker -- 1.50 packs/day for 35 years    Types: Cigarettes    Quit  date: 11/14/2011  . Smokeless tobacco: Never Used     Comment: quit 2 weeks prior to CABG 12/2011  . Alcohol Use: 0.6 oz/week    1 Cans of beer per week     Comment: Drinks once per month; beer.  . Drug Use: No  . Sexual Activity:    Partners: Female    Birth Control/ Protection: Condom   Other Topics Concern  . None   Social History Narrative   Marital status: divorced since 1996; dating casually in 2016.        Lives: Lives alone.          Children:  One son and a granddaughter 1 years old live in [place].       Tobacco: quit with CABG      Alcohol: weekends; 6-8 beers on average.  Beers with football; ten on football weekends.      Exercise: Light, yard work.        Employment:  Works 45-50 hrs. Some weeks.  [employer].  Desk work since 05/2013. Since 1979.      Guns:  Guns in the home not stored in locked cabinet. Smoke alarm in home, Always wears seatbelts.       Sexual History: active;Last HIV testing 2015; has has 25+ partners; females only.             Past Medical History  Diagnosis Date  . Type II diabetes mellitus ([place])   . GERD (gastroesophageal reflux disease)   . Hypertension   .  Hyperlipidemia   . CAD (coronary artery disease), severe multivessel CAD surgical consult for CABG     a. 12/2011 Cath: 3 vessel CAD with normal EF;  b. 12/2011 CABG x 5: LIMA to LAD, SVG to distal RCA with sequential SVG to PDA, SVG to OM, and SVG to D2;  c. 03/2014 ETT: Ex time 6:21, 39mm horizontal ST dep in V3-V6, HTN response.  Marland Kitchen Anxiety state, unspecified   . Impotence of organic origin   . Internal hemorrhoids without mention of complication   . Personal history of colonic polyps   . Diaphragmatic hernia without mention of obstruction or gangrene   . Depression   . OSA on CPAP     a. Sleep study 07/2013 +sever OSA; CPAP at 10cm recommended.  . Kidney stone   . Diverticulosis      Patient Active Problem List   Diagnosis Date Noted  . Anemia 12/26/2013  . Barrett's  esophagus 12/01/2013  . Personal history of colonic polyps 10/19/2013  . OSA on CPAP 09/20/2013  . GERD (gastroesophageal reflux disease) 02/28/2013  . Coronary artery disease 08/30/2012  . Depression with anxiety 05/31/2012  . Hypertension   . Hyperlipidemia   . Diabetes mellitus type 2, uncontrolled, without complications ([place]) [id number]  . History of tobacco abuse 01/06/2012  . S/P CABG x 5 01/05/2012  . Unstable angina ([place]) 01/02/2012  . Cardiovascular stress test abnormal 01/02/2012  . CAD at cath, Nl LVF, severe multivessel CAD surgical consult for CABG 01/02/2012    Past Surgical History  Procedure Laterality Date  . Rotator cuff curgery  2011    lt rotator cuff  . Cardiac catheterization  4 /19/ 2013    [place]   . Coronary artery bypass graft  01/05/2012    Procedure: CORONARY ARTERY BYPASS GRAFTING (CABG);  Surgeon: Rexene Alberts, MD;  Location: [place];  Service: Open Heart Surgery;  Laterality: N/A;  coronary artery bypass graft times five using left internal mammary artery and right leg greater saphenous vein harvested endoscopically   . Shoulder surgery  1997    bilateral  . Colonoscopy w/ polypectomy  09/16/2007    colon polyps.  Repeat 5 years. Iftikhar.  . Polysomnography  07/2013    +severe OSA; CPAP at 10cm pressure recommended.  . Cholecystectomy  11/03/13  . Colonoscopy  11/01/13    multiple polyps; diverticulosis. Sankar. Repeat 5 years.  . Left heart catheterization with coronary angiogram N/A 01/02/2012    Procedure: LEFT HEART CATHETERIZATION WITH CORONARY ANGIOGRAM;  Surgeon: Troy Sine, MD;  Location[facility] C[facility] LAB;  Service: Cardiovascular;  Laterality: N/A;  . Wisdom tooth extraction      Family History        Family Status  Relation Status Death Age  . Mother Deceased 33    lung cancer; Defibrillator; age 45.  . Father Deceased 52    COPD  . Sister Alive   . Maternal Grandmother Deceased   . Maternal Grandfather Deceased   . Paternal  Grandmother Deceased   . Paternal Grandfather Deceased   . Sister Alive   . Sister Alive   . Brother Alive   . Brother Alive   . Brother Alive   . Son Alive         His family history includes COPD in his father; Cancer in his paternal grandfather and paternal grandmother; Cancer (age of onset: 75) in his father; Cancer (age of onset: 13) in his mother; Diabetes in his  mother; Heart attack in his mother; Heart disease in his maternal grandfather, maternal grandmother, and mother; Hyperlipidemia in his father and mother; Hypertension in his father, mother, sister, and sister.    Allergies  Allergen Reactions  . Penicillins Itching    tingling  . Tramadol Rash    Current Meds  Medication Sig  . atorvastatin (LIPITOR) 40 MG tablet TAKE 1 TABLET (40 MG TOTAL) BY MOUTH DAILY AT 6 PM  . canagliflozin (INVOKANA) 300 MG TABS tablet Take 300 mg by mouth daily before breakfast.  . clonazePAM (KLONOPIN) 1 MG tablet Take 1 tablet (1 mg total) by mouth 2 (two) times daily.  Marland Kitchen glipiZIDE (GLUCOTROL XL) 10 MG 24 hr tablet Take 1 tablet (10 mg total) by mouth daily with breakfast.  . metFORMIN (GLUCOPHAGE) 1000 MG tablet TAKE 1 TABLET (1,000 MG TOTAL) BY MOUTH 2 (TWO) TIMES DAILY WITH A MEAL.  . metoprolol tartrate (LOPRESSOR) 25 MG tablet Take 1 tablet (25 mg total) by mouth 2 (two) times daily  . sitaGLIPtin (JANUVIA) 100 MG tablet TAKE 1 TABLET (100 MG TOTAL) BY MOUTH DAILY.  Marland Kitchen venlafaxine XR (EFFEXOR-XR) 150 MG 24 hr capsule Take 1 capsule (150 mg total) by mouth daily with breakfast.  . [DISCONTINUED] venlafaxine XR (EFFEXOR-XR) 75 MG 24 hr capsule TAKE 3 CAPSULES (225 MG TOTAL) BY MOUTH DAILY WITH BREAKFAST.    Patient Care Team: Mar Daring, PA-C as PCP - General (Family Medicine) Terance Ice, MD as Consulting Physician (Cardiology) [name], MD as Consulting Physician (Family Medicine) Seeplaputhur Robinette Haines, MD (General Surgery) Cristy Friedlander, MD (Oral Surgery)      Objective:   Vitals: BP 110/62 mmHg  Pulse 64  Temp(Src) 98 F (36.7 C) (Oral)  Resp 16  Ht 5\' 10"  (1.778 m)  Wt 171 lb (77.565 kg)  BMI 24.54 kg/m2   Physical Exam  Constitutional: He is oriented to person, place, and time. He appears well-developed and well-nourished.  HENT:  Head: Normocephalic and atraumatic.  Right Ear: External ear normal.  Left Ear: External ear normal.  Nose: Nose normal.  Mouth/Throat: Oropharynx is clear and moist.  Eyes: Conjunctivae and EOM are normal. Pupils are equal, round, and reactive to light. Right eye exhibits no discharge.  Neck: Normal range of motion. Neck supple. No tracheal deviation present. No thyromegaly present.  Cardiovascular: Normal rate, regular rhythm, normal heart sounds and intact distal pulses.   No murmur heard. Pulmonary/Chest: Effort normal and breath sounds normal. No respiratory distress. He has no wheezes. He has no rales. He exhibits no tenderness.  Abdominal: Soft. He exhibits no distension and no mass. There is no tenderness. There is no rebound and no guarding.  Musculoskeletal: Normal range of motion. He exhibits no edema or tenderness.  Lymphadenopathy:    He has no cervical adenopathy.  Neurological: He is alert and oriented to person, place, and time. He has normal reflexes. No cranial nerve deficit. He exhibits normal muscle tone. Coordination normal.  Skin: Skin is warm and dry. No rash noted. No erythema.  Psychiatric: He has a normal mood and affect. His behavior is normal. Judgment and thought content normal.     Depression Screen PHQ 2/9 Scores 03/25/2016 10/05/2015 03/07/2015 11/20/2014  PHQ - 2 Score 0 0 0 0   Diabetic Foot Form - Detailed   Diabetic Foot Exam - detailed  Diabetic Foot exam was performed with the following findings:  Yes 03/25/2016  9:35 AM  Visual Foot Exam completed.:  Yes  Is there a history of foot ulcer?:  No  Can the patient see the bottom of their feet?:  Yes  Are the shoes  appropriate in style and fit?:  Yes  Is there swelling or and abnormal foot shape?:  No  Are the toenails long?:  No  Are the toenails thick?:  Yes  Do you have pain in calf while walking?:  No  Is there a claw toe deformity?:  No  Is there elevated skin temparature?:  No  Is there limited skin dorsiflexion?:  No  Is there foot or ankle muscle weakness?:  No  Are the toenails ingrown?:  No  Normal Range of Motion:  Yes    Pulse Foot Exam completed.:  Yes  Right posterior Tibialias:  Present Left posterior Tibialias:  Present  Right Dorsalis Pedis:  Present Left Dorsalis Pedis:  Present  Sensory Foot Exam Completed.:  Yes  Swelling:  No  Semmes-Weinstein Monofilament Test  R Foot Test Control:  Pos L Foot Test Control:  Pos  R Site 1-Great Toe:  Pos L Site 1-Great Toe:  Pos  R Site 4:  Pos L Site 4:  Pos  R Site 5:  Pos L Site 5:  Pos    Comments:  Normal foot exam         Assessment & Plan:     Routine Health Maintenance and Physical Exam  Exercise Activities and Dietary recommendations Goals    None      Immunization History  Administered Date(s) Administered  . Hepatitis B, adult 06/18/2015, 10/05/2015  . Pneumococcal Conjugate-13 08/14/2014  . Pneumococcal Polysaccharide-23 06/18/2015  . Pneumococcal-Unspecified 09/15/2004  . Td 09/16/2007    Health Maintenance  Topic Date Due  . OPHTHALMOLOGY EXAM  12/25/2015  . FOOT EXAM  03/06/2016  . HEMOGLOBIN A1C  04/07/2016  . INFLUENZA VACCINE  04/15/2016  . URINE MICROALBUMIN  06/17/2016  . TETANUS/TDAP  09/15/2017  . COLONOSCOPY  11/01/2018  . PNEUMOCOCCAL POLYSACCHARIDE VACCINE  Completed  . Hepatitis C Screening  Completed  . HIV Screening  Completed      Discussed health benefits of physical activity, and encouraged him to engage in regular exercise appropriate for his age and condition.   1. Annual physical exam Normal physical exam today. Will check labs as below and f/u pending lab results. If labs  are stable and WNL he will not need to have these rechecked for one year at his next annual physical exam. He is to call the office in the meantime if he has any acute issue, questions or concerns. - CBC with Differential - Comprehensive metabolic panel - TSH  2. Prostate cancer screening Will check labs as below and f/u pending results. - PSA(Must document that pt has been informed of limitations of PSA testing.)  3. Bladder cancer screening UA was normal with exception of glucose which is normal with Januvia. - POCT Urinalysis Dipstick  4. Essential hypertension Stable. Continue current medical treatment plan.  5. Hyperlipidemia Stable. Continue current medical treatment plan. Will check labs as below and f/u pending results. - Lipid panel  6. Type 2 diabetes mellitus without complication, without long-term current use of insulin (HCC) Microalbumin was <20. Foot exam normal. Will f/u in 3 months. - POCT UA - Microalbumin - Hemoglobin A1c - HM DIABETES FOOT EXAM  --------------------------------------------------------------------    Mar Daring, PA-C  [facility]

## 2016-03-25 NOTE — Patient Instructions (Signed)

## 2016-03-26 ENCOUNTER — Ambulatory Visit (HOSPITAL_COMMUNITY): Payer: BLUE CROSS/BLUE SHIELD | Attending: Cardiology

## 2016-03-26 ENCOUNTER — Other Ambulatory Visit: Payer: Self-pay

## 2016-03-26 DIAGNOSIS — I059 Rheumatic mitral valve disease, unspecified: Secondary | ICD-10-CM | POA: Diagnosis not present

## 2016-03-26 DIAGNOSIS — I358 Other nonrheumatic aortic valve disorders: Secondary | ICD-10-CM | POA: Diagnosis not present

## 2016-03-26 DIAGNOSIS — Z951 Presence of aortocoronary bypass graft: Secondary | ICD-10-CM | POA: Diagnosis not present

## 2016-03-26 DIAGNOSIS — Z720 Tobacco use: Secondary | ICD-10-CM | POA: Insufficient documentation

## 2016-03-26 DIAGNOSIS — G4733 Obstructive sleep apnea (adult) (pediatric): Secondary | ICD-10-CM | POA: Diagnosis not present

## 2016-03-26 DIAGNOSIS — I119 Hypertensive heart disease without heart failure: Secondary | ICD-10-CM | POA: Diagnosis not present

## 2016-03-26 DIAGNOSIS — I700 Atherosclerosis of aorta: Secondary | ICD-10-CM | POA: Diagnosis not present

## 2016-03-26 DIAGNOSIS — E785 Hyperlipidemia, unspecified: Secondary | ICD-10-CM | POA: Diagnosis not present

## 2016-03-27 DIAGNOSIS — E119 Type 2 diabetes mellitus without complications: Secondary | ICD-10-CM | POA: Diagnosis not present

## 2016-03-27 DIAGNOSIS — Z0000 Encounter for general adult medical examination without abnormal findings: Secondary | ICD-10-CM | POA: Diagnosis not present

## 2016-03-27 DIAGNOSIS — E785 Hyperlipidemia, unspecified: Secondary | ICD-10-CM | POA: Diagnosis not present

## 2016-03-27 DIAGNOSIS — Z125 Encounter for screening for malignant neoplasm of prostate: Secondary | ICD-10-CM | POA: Diagnosis not present

## 2016-03-28 ENCOUNTER — Telehealth: Payer: Self-pay

## 2016-03-28 ENCOUNTER — Telehealth: Payer: Self-pay | Admitting: Cardiovascular Disease

## 2016-03-28 LAB — CBC WITH DIFFERENTIAL/PLATELET
BASOS ABS: 0.1 10*3/uL (ref 0.0–0.2)
Basos: 2 %
EOS (ABSOLUTE): 0.5 10*3/uL — AB (ref 0.0–0.4)
Eos: 7 %
Hematocrit: 38.3 % (ref 37.5–51.0)
Hemoglobin: 11.8 g/dL — ABNORMAL LOW (ref 12.6–17.7)
IMMATURE GRANS (ABS): 0 10*3/uL (ref 0.0–0.1)
IMMATURE GRANULOCYTES: 0 %
LYMPHS ABS: 1.7 10*3/uL (ref 0.7–3.1)
Lymphs: 23 %
MCH: 23.9 pg — ABNORMAL LOW (ref 26.6–33.0)
MCHC: 30.8 g/dL — ABNORMAL LOW (ref 31.5–35.7)
MCV: 78 fL — ABNORMAL LOW (ref 79–97)
MONOCYTES: 5 %
Monocytes Absolute: 0.4 10*3/uL (ref 0.1–0.9)
NEUTROS ABS: 4.6 10*3/uL (ref 1.4–7.0)
Neutrophils: 63 %
PLATELETS: 487 10*3/uL — AB (ref 150–379)
RBC: 4.94 x10E6/uL (ref 4.14–5.80)
RDW: 17.6 % — ABNORMAL HIGH (ref 12.3–15.4)
WBC: 7.2 10*3/uL (ref 3.4–10.8)

## 2016-03-28 LAB — COMPREHENSIVE METABOLIC PANEL
ALT: 18 IU/L (ref 0–44)
AST: 13 IU/L (ref 0–40)
Albumin/Globulin Ratio: 1.7 (ref 1.2–2.2)
Albumin: 4.7 g/dL (ref 3.5–5.5)
Alkaline Phosphatase: 85 IU/L (ref 39–117)
BUN/Creatinine Ratio: 18 (ref 9–20)
BUN: 13 mg/dL (ref 6–24)
Bilirubin Total: 0.2 mg/dL (ref 0.0–1.2)
CALCIUM: 10.4 mg/dL — AB (ref 8.7–10.2)
CO2: 21 mmol/L (ref 18–29)
CREATININE: 0.71 mg/dL — AB (ref 0.76–1.27)
Chloride: 98 mmol/L (ref 96–106)
GFR, EST AFRICAN AMERICAN: 121 mL/min/{1.73_m2} (ref >59)
GFR, EST NON AFRICAN AMERICAN: 105 mL/min/{1.73_m2} (ref >59)
GLUCOSE: 91 mg/dL (ref 65–99)
Globulin, Total: 2.7 g/dL (ref 1.5–4.5)
Potassium: 5.5 mmol/L — ABNORMAL HIGH (ref 3.5–5.2)
Sodium: 139 mmol/L (ref 134–144)
TOTAL PROTEIN: 7.4 g/dL (ref 6.0–8.5)

## 2016-03-28 LAB — LIPID PANEL
CHOLESTEROL TOTAL: 124 mg/dL (ref 100–199)
Chol/HDL Ratio: 3.4 ratio units (ref 0.0–5.0)
HDL: 36 mg/dL — ABNORMAL LOW (ref >39)
LDL CALC: 44 mg/dL (ref 0–99)
TRIGLYCERIDES: 220 mg/dL — AB (ref 0–149)
VLDL CHOLESTEROL CAL: 44 mg/dL — AB (ref 5–40)

## 2016-03-28 LAB — HEMOGLOBIN A1C
Est. average glucose Bld gHb Est-mCnc: 148 mg/dL
Hgb A1c MFr Bld: 6.8 % — ABNORMAL HIGH (ref 4.8–5.6)

## 2016-03-28 LAB — PSA: Prostate Specific Ag, Serum: 0.5 ng/mL (ref 0.0–4.0)

## 2016-03-28 LAB — TSH: TSH: 1.71 u[IU]/mL (ref 0.450–4.500)

## 2016-03-28 NOTE — Telephone Encounter (Signed)
Patient advised as directed below.Patient states he will come sometimes next week to pick up the hemoccult card.  Thanks,  -Joseline

## 2016-03-28 NOTE — Telephone Encounter (Signed)
EF was normal. He is cleared.

## 2016-03-28 NOTE — Telephone Encounter (Signed)
Pt called back, states he needs a letter for the DOT, stating the tests he sent to [place] through my chart are acceptable. Please call.

## 2016-03-28 NOTE — Telephone Encounter (Signed)
Pt called requesting clearance letter for DOT exam. He had GXT 03/28/15 and echo 03/26/16. States DOT does not require another GXT. Forward to MD

## 2016-03-28 NOTE — Telephone Encounter (Signed)
Informed pt clearance letter has been routed to [place]. Pt appreciative of the call and letter

## 2016-03-28 NOTE — Telephone Encounter (Signed)
-----   Message from Mar Daring, PA-C sent at 03/28/2016  9:48 AM EDT ----- Hemoglobin has decreased and shows signs of iron def anemia. Recommend either him coming and getting hemoccult card or scheduling appt for me to check for blood in stool as cause. Also may benefit by adding ferrous sulfate 325mg  supp once daily. Iron can cause upset stomach and black stools. Take with food. HgBA1c is stable at 6.8. Cholesterol is stable as well. PSA is WNL.

## 2016-03-31 DIAGNOSIS — M5136 Other intervertebral disc degeneration, lumbar region: Secondary | ICD-10-CM | POA: Diagnosis not present

## 2016-03-31 DIAGNOSIS — M9903 Segmental and somatic dysfunction of lumbar region: Secondary | ICD-10-CM | POA: Diagnosis not present

## 2016-03-31 DIAGNOSIS — M9905 Segmental and somatic dysfunction of pelvic region: Secondary | ICD-10-CM | POA: Diagnosis not present

## 2016-03-31 DIAGNOSIS — M955 Acquired deformity of pelvis: Secondary | ICD-10-CM | POA: Diagnosis not present

## 2016-04-02 DIAGNOSIS — M9905 Segmental and somatic dysfunction of pelvic region: Secondary | ICD-10-CM | POA: Diagnosis not present

## 2016-04-02 DIAGNOSIS — M9903 Segmental and somatic dysfunction of lumbar region: Secondary | ICD-10-CM | POA: Diagnosis not present

## 2016-04-02 DIAGNOSIS — M955 Acquired deformity of pelvis: Secondary | ICD-10-CM | POA: Diagnosis not present

## 2016-04-02 DIAGNOSIS — M5136 Other intervertebral disc degeneration, lumbar region: Secondary | ICD-10-CM | POA: Diagnosis not present

## 2016-04-04 ENCOUNTER — Telehealth: Payer: Self-pay | Admitting: Cardiovascular Disease

## 2016-04-04 NOTE — Telephone Encounter (Signed)
Letter typed and faxed to pt @ # provided.

## 2016-04-04 NOTE — Telephone Encounter (Signed)
Joseph Terry had coronary artery bypass graft surgery in 2013 with no determination of ejection fraction since that that. 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 I had to make sure that he wasn't at high risk for arrhythmia.  Please do not hesitate to call us with any concerns.

## 2016-04-04 NOTE — Telephone Encounter (Signed)
Pt calling asking if we may write a letter for him so that the [place] will pay for this Echo he did.  He states they told him it was part of his physical with heart care and he would need to pay 1800 out of pocket Pt states this was to be only done because DOT needed a clearance from [place] and that we asked him to do the Echo to clear him. Gave him number to DJ in [place] for more billing questions. If we are able to this letter please fax letter to patient.  Fax it to patient: [phone]

## 2016-04-07 ENCOUNTER — Ambulatory Visit: Payer: BLUE CROSS/BLUE SHIELD | Admitting: Physician Assistant

## 2016-04-10 ENCOUNTER — Encounter: Payer: Self-pay | Admitting: Cardiovascular Disease

## 2016-04-14 ENCOUNTER — Encounter: Payer: Self-pay | Admitting: Cardiovascular Disease

## 2016-04-14 ENCOUNTER — Telehealth: Payer: Self-pay | Admitting: Physician Assistant

## 2016-04-14 DIAGNOSIS — I25810 Atherosclerosis of coronary artery bypass graft(s) without angina pectoris: Secondary | ICD-10-CM

## 2016-04-14 NOTE — Telephone Encounter (Signed)
So does he need to see someone outside of [facility], like someone at [facility] maybe? Anyone else in [facility] will be same price.

## 2016-04-14 NOTE — Telephone Encounter (Signed)
Pt is requesting a referral to another cardiologist other that Dr Fletcher Anon with [facility].  Pt states he has been going there for 4 years and this past year a was done that cost him $1900.00 due to being done with [facility].  [id number]

## 2016-04-15 NOTE — Telephone Encounter (Signed)
Perfect. Thanks.

## 2016-04-15 NOTE — Telephone Encounter (Signed)
Spoke with Mr. Saladin; he would like to see someone at [facility].  I placed the order for the referral.   Thanks,   -Mickel Baas

## 2016-04-22 DIAGNOSIS — E782 Mixed hyperlipidemia: Secondary | ICD-10-CM | POA: Diagnosis not present

## 2016-04-22 DIAGNOSIS — I10 Essential (primary) hypertension: Secondary | ICD-10-CM | POA: Diagnosis not present

## 2016-04-22 DIAGNOSIS — I25708 Atherosclerosis of coronary artery bypass graft(s), unspecified, with other forms of angina pectoris: Secondary | ICD-10-CM | POA: Diagnosis not present

## 2016-05-31 ENCOUNTER — Other Ambulatory Visit: Payer: Self-pay | Admitting: Family Medicine

## 2016-06-20 ENCOUNTER — Other Ambulatory Visit: Payer: Self-pay | Admitting: Family Medicine

## 2016-06-23 ENCOUNTER — Other Ambulatory Visit: Payer: Self-pay | Admitting: Physician Assistant

## 2016-06-23 DIAGNOSIS — E7800 Pure hypercholesterolemia, unspecified: Secondary | ICD-10-CM

## 2016-06-26 ENCOUNTER — Encounter: Payer: Self-pay | Admitting: Physician Assistant

## 2016-06-26 ENCOUNTER — Ambulatory Visit (INDEPENDENT_AMBULATORY_CARE_PROVIDER_SITE_OTHER): Payer: BLUE CROSS/BLUE SHIELD | Admitting: Physician Assistant

## 2016-06-26 VITALS — BP 112/60 | HR 74 | Temp 97.9°F | Resp 16 | Ht 70.0 in | Wt 173.0 lb

## 2016-06-26 DIAGNOSIS — I10 Essential (primary) hypertension: Secondary | ICD-10-CM | POA: Diagnosis not present

## 2016-06-26 DIAGNOSIS — E119 Type 2 diabetes mellitus without complications: Secondary | ICD-10-CM

## 2016-06-26 LAB — POCT GLYCOSYLATED HEMOGLOBIN (HGB A1C)
Est. average glucose Bld gHb Est-mCnc: 148
Hemoglobin A1C: 6.8

## 2016-06-26 NOTE — Progress Notes (Signed)
Patient: Joseph Terry Male    DOB: 1961-05-09   55 y.o.   MRN: [id number] Visit Date: 06/26/2016  Today's Provider: Mar Daring, PA-C   Chief Complaint  Patient presents with  . Diabetes  . Hypertension   Subjective:    HPI  Diabetes Mellitus Type II, Follow-up:   Lab Results  Component Value Date   HGBA1C 6.8 06/26/2016   HGBA1C 6.8 (H) 03/27/2016   HGBA1C 6.8 (H) 01/07/2016    Last seen for diabetes 3 months ago.  Management since then includes no changes. He reports excellent compliance with treatment. He is not having side effects.  Current symptoms include none and have been stable. Home blood sugar records: not being checked  Episodes of hypoglycemia? no   Current Insulin Regimen:  Most Recent Eye Exam: UTD; Saw Dr. Gloriann Loan in 10/2015-no record has been sent to [place]; requested in July 2017. Weight trend: stable Prior visit with dietician: no Current diet: in general, a "healthy" diet   Current exercise: none  Pertinent Labs:    Component Value Date/Time   CHOL 124 03/27/2016 0813   TRIG 220 (H) 03/27/2016 0813   HDL 36 (L) 03/27/2016 0813   LDLCALC 44 03/27/2016 0813   CREATININE 0.71 (L) 03/27/2016 0813   CREATININE 0.76 06/18/2015 0821    Wt Readings from Last 3 Encounters:  06/26/16 173 lb (78.5 kg)  03/25/16 171 lb (77.6 kg)  01/07/16 173 lb 6.4 oz (78.7 kg)    ------------------------------------------------------------------------   Hypertension, follow-up:  BP Readings from Last 3 Encounters:  06/26/16 112/60  03/25/16 110/62  01/07/16 140/80    He was last seen for hypertension 3 months ago.  BP at that visit was 110/62. Management changes since that visit include no changes. He reports excellent compliance with treatment. He is not having side effects.  He is exercising. He is adherent to low salt diet.   Outside blood pressures are stable. He is experiencing none.  Patient denies chest pressure/discomfort and  lower extremity edema.   Cardiovascular risk factors include advanced age (older than 42 for men, 37 for women), diabetes mellitus and hypertension.  Use of agents associated with hypertension: none.  ------------------------------------------------------------------------      Allergies  Allergen Reactions  . Penicillins Itching    tingling  . Tramadol Rash     Current Outpatient Prescriptions:  .  aspirin 81 MG tablet, Take 81 mg by mouth daily. Reported on 03/25/2016, Disp: , Rfl:  .  atorvastatin (LIPITOR) 40 MG tablet, TAKE 1 TABLET (40 MG TOTAL) BY MOUTH DAILY AT 6 PM, Disp: 90 tablet, Rfl: 1 .  canagliflozin (INVOKANA) 300 MG TABS tablet, Take 300 mg by mouth daily before breakfast., Disp: 30 tablet, Rfl: 11 .  clonazePAM (KLONOPIN) 1 MG tablet, Take 1 tablet (1 mg total) by mouth 2 (two) times daily., Disp: 60 tablet, Rfl: 5 .  glipiZIDE (GLUCOTROL XL) 10 MG 24 hr tablet, Take 1 tablet (10 mg total) by mouth daily with breakfast., Disp: 30 tablet, Rfl: 11 .  JANUVIA 100 MG tablet, TAKE 1 TABLET (100 MG TOTAL) BY MOUTH DAILY., Disp: 30 tablet, Rfl: 0 .  metFORMIN (GLUCOPHAGE) 1000 MG tablet, TAKE 1 TABLET (1,000 MG TOTAL) BY MOUTH 2 (TWO) TIMES DAILY WITH A MEAL., Disp: 60 tablet, Rfl: 6 .  metoprolol tartrate (LOPRESSOR) 25 MG tablet, Take 1 tablet (25 mg total) by mouth 2 (two) times daily, Disp: 60 tablet, Rfl: 5 .  Omega-3 Fatty Acids (FISH OIL) 1200 MG CAPS, Take by mouth 2 (two) times daily. Reported on 03/25/2016, Disp: , Rfl:  .  venlafaxine XR (EFFEXOR-XR) 150 MG 24 hr capsule, Take 1 capsule (150 mg total) by mouth daily with breakfast., Disp: 30 capsule, Rfl: 11  Review of Systems  Constitutional: Negative.   Respiratory: Negative.   Cardiovascular: Negative.   Gastrointestinal: Negative.   Endocrine: Negative.   Neurological: Negative.     Social History  Substance Use Topics  . Smoking status: Former Smoker    Packs/day: 1.50    Years: 35.00    Types:  Cigarettes    Quit date: 11/14/2011  . Smokeless tobacco: Never Used     Comment: quit 2 weeks prior to CABG 12/2011  . Alcohol use 0.6 oz/week    1 Cans of beer per week     Comment: Drinks once per month; beer.   Objective:   BP 112/60 (BP Location: Left Arm, Patient Position: Sitting, Cuff Size: Large)   Pulse 74   Temp 97.9 F (36.6 C) (Oral)   Resp 16   Ht 5\' 10"  (1.778 m)   Wt 173 lb (78.5 kg)   SpO2 97%   BMI 24.82 kg/m   Physical Exam  Constitutional: He appears well-developed and well-nourished. No distress.  HENT:  Head: Normocephalic and atraumatic.  Neck: Normal range of motion. Neck supple. No tracheal deviation present. No thyromegaly present.  Cardiovascular: Normal rate, regular rhythm and normal heart sounds.  Exam reveals no gallop and no friction rub.   No murmur heard. Pulmonary/Chest: Effort normal and breath sounds normal. No respiratory distress. He has no wheezes. He has no rales.  Lymphadenopathy:    He has no cervical adenopathy.  Skin: He is not diaphoretic.  Vitals reviewed.     Assessment & Plan:     1. Type 2 diabetes mellitus without complication, without long-term current use of insulin (HCC) Stable at 6.8. Continue invokana, januvia, metformin and glipizide. Will recheck in 3 months. Will also check cholesterol and other labs at that visit as well.  - POCT glycosylated hemoglobin (Hb A1C)  2. Essential hypertension Stable. Will check labs in 3 months.       Mar Daring, PA-C  [facility]

## 2016-06-26 NOTE — Patient Instructions (Signed)
DASH Eating Plan  DASH stands for "Dietary Approaches to Stop Hypertension." The DASH eating plan is a healthy eating plan that has been shown to reduce high blood pressure (hypertension). Additional health benefits may include reducing the risk of type 2 diabetes mellitus, heart disease, and stroke. The DASH eating plan may also help with weight loss.  WHAT DO I NEED TO KNOW ABOUT THE DASH EATING PLAN?  For the DASH eating plan, you will follow these general guidelines:  · Choose foods with a percent daily value for sodium of less than 5% (as listed on the food label).  · Use salt-free seasonings or herbs instead of table salt or sea salt.  · Check with your health care provider or pharmacist before using salt substitutes.  · Eat lower-sodium products, often labeled as "lower sodium" or "no salt added."  · Eat fresh foods.  · Eat more vegetables, fruits, and low-fat dairy products.  · Choose whole grains. Look for the word "whole" as the first word in the ingredient list.  · Choose fish and skinless chicken or turkey more often than red meat. Limit fish, poultry, and meat to 6 oz (170 g) each day.  · Limit sweets, desserts, sugars, and sugary drinks.  · Choose heart-healthy fats.  · Limit cheese to 1 oz (28 g) per day.  · Eat more home-cooked food and less restaurant, buffet, and fast food.  · Limit fried foods.  · Cook foods using methods other than frying.  · Limit canned vegetables. If you do use them, rinse them well to decrease the sodium.  · When eating at a restaurant, ask that your food be prepared with less salt, or no salt if possible.  WHAT FOODS CAN I EAT?  Seek help from a dietitian for individual calorie needs.  Grains  Whole grain or whole wheat bread. Brown rice. Whole grain or whole wheat pasta. Quinoa, bulgur, and whole grain cereals. Low-sodium cereals. Corn or whole wheat flour tortillas. Whole grain cornbread. Whole grain crackers. Low-sodium crackers.  Vegetables  Fresh or frozen vegetables  (raw, steamed, roasted, or grilled). Low-sodium or reduced-sodium tomato and vegetable juices. Low-sodium or reduced-sodium tomato sauce and paste. Low-sodium or reduced-sodium canned vegetables.   Fruits  All fresh, canned (in natural juice), or frozen fruits.  Meat and Other Protein Products  Ground beef (85% or leaner), grass-fed beef, or beef trimmed of fat. Skinless chicken or turkey. Ground chicken or turkey. Pork trimmed of fat. All fish and seafood. Eggs. Dried beans, peas, or lentils. Unsalted nuts and seeds. Unsalted canned beans.  Dairy  Low-fat dairy products, such as skim or 1% milk, 2% or reduced-fat cheeses, low-fat ricotta or cottage cheese, or plain low-fat yogurt. Low-sodium or reduced-sodium cheeses.  Fats and Oils  Tub margarines without trans fats. Light or reduced-fat mayonnaise and salad dressings (reduced sodium). Avocado. Safflower, olive, or canola oils. Natural peanut or almond butter.  Other  Unsalted popcorn and pretzels.  The items listed above may not be a complete list of recommended foods or beverages. Contact your dietitian for more options.  WHAT FOODS ARE NOT RECOMMENDED?  Grains  White bread. White pasta. White rice. Refined cornbread. Bagels and croissants. Crackers that contain trans fat.  Vegetables  Creamed or fried vegetables. Vegetables in a cheese sauce. Regular canned vegetables. Regular canned tomato sauce and paste. Regular tomato and vegetable juices.  Fruits  Dried fruits. Canned fruit in light or heavy syrup. Fruit juice.  Meat and Other Protein   Products  Fatty cuts of meat. Ribs, chicken wings, bacon, sausage, bologna, salami, chitterlings, fatback, hot dogs, bratwurst, and packaged luncheon meats. Salted nuts and seeds. Canned beans with salt.  Dairy  Whole or 2% milk, cream, half-and-half, and cream cheese. Whole-fat or sweetened yogurt. Full-fat cheeses or blue cheese. Nondairy creamers and whipped toppings. Processed cheese, cheese spreads, or cheese  curds.  Condiments  Onion and garlic salt, seasoned salt, table salt, and sea salt. Canned and packaged gravies. Worcestershire sauce. Tartar sauce. Barbecue sauce. Teriyaki sauce. Soy sauce, including reduced sodium. Steak sauce. Fish sauce. Oyster sauce. Cocktail sauce. Horseradish. Ketchup and mustard. Meat flavorings and tenderizers. Bouillon cubes. Hot sauce. Tabasco sauce. Marinades. Taco seasonings. Relishes.  Fats and Oils  Butter, stick margarine, lard, shortening, ghee, and bacon fat. Coconut, palm kernel, or palm oils. Regular salad dressings.  Other  Pickles and olives. Salted popcorn and pretzels.  The items listed above may not be a complete list of foods and beverages to avoid. Contact your dietitian for more information.  WHERE CAN I FIND MORE INFORMATION?  National Heart, Lung, and Blood Institute: www.nhlbi.nih.gov/health/health-topics/topics/dash/     This information is not intended to replace advice given to you by your health care provider. Make sure you discuss any questions you have with your health care provider.     Document Released: 08/21/2011 Document Revised: 09/22/2014 Document Reviewed: 07/06/2013  Elsevier Interactive Patient Education ©2016 Elsevier Inc.

## 2016-06-29 ENCOUNTER — Other Ambulatory Visit: Payer: Self-pay | Admitting: Family Medicine

## 2016-06-30 ENCOUNTER — Other Ambulatory Visit: Payer: Self-pay | Admitting: Physician Assistant

## 2016-06-30 ENCOUNTER — Telehealth: Payer: Self-pay | Admitting: Physician Assistant

## 2016-06-30 DIAGNOSIS — E119 Type 2 diabetes mellitus without complications: Secondary | ICD-10-CM

## 2016-06-30 MED ORDER — SITAGLIPTIN PHOSPHATE 100 MG PO TABS: ORAL_TABLET | ORAL | 3 refills | Status: DC

## 2016-06-30 NOTE — Telephone Encounter (Signed)
Pt needs refill on the  Januvia 100mg   [facility] [place].  Pt's call back is [phone]  Thanks Con Memos

## 2016-06-30 NOTE — Telephone Encounter (Signed)
Pt call for refill on his   Januvia 100mg    He uses [facility]   Thank sTeri

## 2016-07-11 ENCOUNTER — Other Ambulatory Visit: Payer: Self-pay | Admitting: Physician Assistant

## 2016-07-11 ENCOUNTER — Other Ambulatory Visit: Payer: Self-pay | Admitting: Family Medicine

## 2016-07-11 DIAGNOSIS — E119 Type 2 diabetes mellitus without complications: Secondary | ICD-10-CM

## 2016-07-11 DIAGNOSIS — F418 Other specified anxiety disorders: Secondary | ICD-10-CM

## 2016-07-12 ENCOUNTER — Other Ambulatory Visit: Payer: Self-pay | Admitting: Family Medicine

## 2016-07-12 DIAGNOSIS — E119 Type 2 diabetes mellitus without complications: Secondary | ICD-10-CM

## 2016-07-14 MED ORDER — GLIPIZIDE ER 10 MG PO TB24: 10.0000 mg | ORAL_TABLET | Freq: Every day | ORAL | 11 refills | Status: DC

## 2016-07-14 MED ORDER — CANAGLIFLOZIN 300 MG PO TABS: 300.0000 mg | ORAL_TABLET | Freq: Every day | ORAL | 11 refills | Status: DC

## 2016-07-14 NOTE — Telephone Encounter (Signed)
Please call in clonazepam 1mg  Take 1 tab PO BID prn # [id number]

## 2016-07-14 NOTE — Telephone Encounter (Signed)
Pt contacted office for refill request on the following medications:  [facility].  [id number]  canagliflozin (INVOKANA) 300 MG TABS tablet  glipiZIDE (GLUCOTROL XL) 10 MG 24 hr tablet  clonazePAM (KLONOPIN) 1 MG tablet

## 2016-07-15 NOTE — Telephone Encounter (Signed)
[facility] 

## 2016-09-08 ENCOUNTER — Other Ambulatory Visit: Payer: Self-pay | Admitting: Physician Assistant

## 2016-09-08 DIAGNOSIS — E119 Type 2 diabetes mellitus without complications: Secondary | ICD-10-CM

## 2016-09-26 ENCOUNTER — Ambulatory Visit (INDEPENDENT_AMBULATORY_CARE_PROVIDER_SITE_OTHER): Payer: BLUE CROSS/BLUE SHIELD | Admitting: Physician Assistant

## 2016-09-26 ENCOUNTER — Encounter: Payer: Self-pay | Admitting: Physician Assistant

## 2016-09-26 VITALS — BP 110/70 | HR 66 | Temp 97.9°F | Resp 16 | Ht 70.0 in | Wt 175.0 lb

## 2016-09-26 DIAGNOSIS — E7800 Pure hypercholesterolemia, unspecified: Secondary | ICD-10-CM

## 2016-09-26 DIAGNOSIS — E119 Type 2 diabetes mellitus without complications: Secondary | ICD-10-CM

## 2016-09-26 DIAGNOSIS — I10 Essential (primary) hypertension: Secondary | ICD-10-CM

## 2016-09-26 LAB — POCT GLYCOSYLATED HEMOGLOBIN (HGB A1C)
ESTIMATED AVERAGE GLUCOSE: 151
Hemoglobin A1C: 6.9

## 2016-09-26 NOTE — Progress Notes (Signed)
Patient: Joseph Terry Male    DOB: March 27, 1961   56 y.o.   MRN: [id number] Visit Date: 09/26/2016  Today's Provider: Mar Daring, PA-C   Chief Complaint  Patient presents with  . Diabetes  . Hypertension  . Hyperlipidemia   Subjective:    HPI  Diabetes Mellitus Type II, Follow-up:   Lab Results  Component Value Date   HGBA1C 6.9 09/26/2016   HGBA1C 6.8 06/26/2016   HGBA1C 6.8 (H) 03/27/2016   Last seen for diabetes 3 months ago.  Management since then includes no changes. He reports excellent compliance with treatment. He is not having side effects.  Current symptoms include none and have been stable. Home blood sugar records: not being checked  Episodes of hypoglycemia? no   Current Insulin Regimen: none Most Recent Eye Exam: UTD Weight trend: stable Prior visit with dietician: no Current diet: in general, a "healthy" diet   Current exercise: none  ------------------------------------------------------------------------   Hypertension, follow-up:  BP Readings from Last 3 Encounters:  09/26/16 110/70  06/26/16 112/60  03/25/16 110/62    He was last seen for hypertension 3 months ago.  BP at that visit was 112/60. Management since that visit includes check labs.He reports excellent compliance with treatment. He is not having side effects.  He is not exercising. He is adherent to low salt diet.   Outside blood pressures are not being checked. He is experiencing none.  Patient denies chest pain.   Cardiovascular risk factors include advanced age (older than 23 for men, 28 for women), diabetes mellitus and dyslipidemia.  Use of agents associated with hypertension: none.   ------------------------------------------------------------------------    Lipid/Cholesterol, Follow-up:   Last seen for this 6 months ago.  Management since that visit includes check labs.  Last Lipid Panel:    Component Value Date/Time   CHOL 124 03/27/2016  0813   TRIG 220 (H) 03/27/2016 0813   HDL 36 (L) 03/27/2016 0813   CHOLHDL 3.4 03/27/2016 0813   CHOLHDL 4.7 08/14/2014 0905   VLDL NOT CALC 08/14/2014 0905   LDLCALC 44 03/27/2016 0813    He reports excellent compliance with treatment. He is not having side effects.   Wt Readings from Last 3 Encounters:  09/26/16 175 lb (79.4 kg)  06/26/16 173 lb (78.5 kg)  03/25/16 171 lb (77.6 kg)   ------------------------------------------------------------------------  Patient Active Problem List   Diagnosis Date Noted  . Anemia 12/26/2013  . Barrett's esophagus 12/01/2013  . Personal history of colonic polyps 10/19/2013  . OSA on CPAP 09/20/2013  . GERD (gastroesophageal reflux disease) 02/28/2013  . Coronary artery disease 08/30/2012  . Depression with anxiety 05/31/2012  . Hypertension   . Hyperlipidemia   . Diabetes mellitus type 2, uncontrolled, without complications ([place]) [id number]  . History of tobacco abuse 01/06/2012  . S/P CABG x 5 01/05/2012  . Unstable angina ([place]) 01/02/2012  . Cardiovascular stress test abnormal 01/02/2012  . CAD at cath, Nl LVF, severe multivessel CAD surgical consult for CABG 01/02/2012       Allergies  Allergen Reactions  . Penicillins Itching    tingling  . Tramadol Rash     Current Outpatient Prescriptions:  .  atorvastatin (LIPITOR) 40 MG tablet, TAKE 1 TABLET (40 MG TOTAL) BY MOUTH DAILY AT 6 PM, Disp: 90 tablet, Rfl: 1 .  canagliflozin (INVOKANA) 300 MG TABS tablet, Take 1 tablet (300 mg total) by mouth daily before breakfast., Disp: 30 tablet, Rfl: 11 .  clonazePAM (KLONOPIN) 1 MG tablet, TAKE 1 TABLET BY MOUTH TWICE A DAY, Disp: 60 tablet, Rfl: 5 .  glipiZIDE (GLUCOTROL XL) 10 MG 24 hr tablet, Take 1 tablet (10 mg total) by mouth daily with breakfast., Disp: 30 tablet, Rfl: 11 .  metFORMIN (GLUCOPHAGE) 1000 MG tablet, TAKE 1 TABLET (1,000 MG TOTAL) BY MOUTH 2 (TWO) TIMES DAILY WITH A MEAL., Disp: 60 tablet, Rfl: 6 .  metoprolol  tartrate (LOPRESSOR) 25 MG tablet, Take 1 tablet (25 mg total) by mouth 2 (two) times daily, Disp: 60 tablet, Rfl: 5 .  Omega-3 Fatty Acids (FISH OIL) 1200 MG CAPS, Take by mouth 2 (two) times daily. Reported on 03/25/2016, Disp: , Rfl:  .  sitaGLIPtin (JANUVIA) 100 MG tablet, TAKE 1 TABLET (100 MG TOTAL) BY MOUTH DAILY., Disp: 90 tablet, Rfl: 3 .  venlafaxine XR (EFFEXOR-XR) 150 MG 24 hr capsule, Take 1 capsule (150 mg total) by mouth daily with breakfast., Disp: 30 capsule, Rfl: 11 .  aspirin 81 MG tablet, Take 81 mg by mouth daily. Reported on 03/25/2016, Disp: , Rfl:   Review of Systems  Constitutional: Negative.   Respiratory: Negative.   Cardiovascular: Negative.   Gastrointestinal: Negative.   Endocrine: Negative.   Neurological: Negative.   Hematological: Bruises/bleeds easily.    Social History  Substance Use Topics  . Smoking status: Former Smoker    Packs/day: 1.50    Years: 35.00    Types: Cigarettes    Quit date: 11/14/2011  . Smokeless tobacco: Never Used     Comment: quit 2 weeks prior to CABG 12/2011  . Alcohol use 0.6 oz/week    1 Cans of beer per week     Comment: Drinks once per month; beer.   Objective:   BP 110/70 (BP Location: Left Arm, Patient Position: Sitting, Cuff Size: Normal)   Pulse 66   Temp 97.9 F (36.6 C) (Oral)   Resp 16   Ht 5\' 10"  (1.778 m)   Wt 175 lb (79.4 kg)   SpO2 99%   BMI 25.11 kg/m   Physical Exam  Constitutional: He appears well-developed and well-nourished. No distress.  HENT:  Head: Normocephalic and atraumatic.  Neck: Normal range of motion. Neck supple.  Cardiovascular: Normal rate, regular rhythm and normal heart sounds.  Exam reveals no gallop and no friction rub.   No murmur heard. Pulmonary/Chest: Effort normal and breath sounds normal. No respiratory distress. He has no wheezes. He has no rales.  Musculoskeletal: He exhibits no edema.  Skin: He is not diaphoretic.  Vitals reviewed.      Assessment & Plan:       1. Type 2 diabetes mellitus without complication, without long-term current use of insulin (HCC) POCT A1c stable at 6.9. Continnue Januvia 100mg , metformin 1000mg  BID, glipizide 10mg , and invokana 300mg . I will see him back in 3 months for f/u.  - POCT glycosylated hemoglobin (Hb A1C)  2. Essential hypertension Stable. Continue metoprolol 25mg  BID.   3. Pure hypercholesterolemia Stable. Continue atorvastatin 40mg .        Mar Daring, PA-C  [facility]

## 2016-09-26 NOTE — Patient Instructions (Signed)
Diabetes Mellitus and Exercise Exercising regularly is important for your overall health, especially when you have diabetes (diabetes mellitus). Exercising is not only about losing weight. It has many health benefits, such as increasing muscle strength and bone density and reducing body fat and stress. This leads to improved fitness, flexibility, and endurance, all of which result in better overall health. Exercise has additional benefits for people with diabetes, including:  Reducing appetite.  Helping to lower and control blood glucose.  Lowering blood pressure.  Helping to control amounts of fatty substances (lipids) in the blood, such as cholesterol and triglycerides.  Helping the body to respond better to insulin (improving insulin sensitivity).  Reducing how much insulin the body needs.  Decreasing the risk for heart disease by:  Lowering cholesterol and triglyceride levels.  Increasing the levels of good cholesterol.  Lowering blood glucose levels. What is my activity plan? Your health care provider or certified diabetes educator can help you make a plan for the type and frequency of exercise (activity plan) that works for you. Make sure that you:  Do at least 150 minutes of moderate-intensity or vigorous-intensity exercise each week. This could be brisk walking, biking, or water aerobics.  Do stretching and strength exercises, such as yoga or weightlifting, at least 2 times a week.  Spread out your activity over at least 3 days of the week.  Get some form of physical activity every day.  Do not go more than 2 days in a row without some kind of physical activity.  Avoid being inactive for more than 90 minutes at a time. Take frequent breaks to walk or stretch.  Choose a type of exercise or activity that you enjoy, and set realistic goals.  Start slowly, and gradually increase the intensity of your exercise over time. What do I need to know about managing my  diabetes?  Check your blood glucose before and after exercising.  If your blood glucose is higher than 240 mg/dL (13.3 mmol/L) before you exercise, check your urine for ketones. If you have ketones in your urine, do not exercise until your blood glucose returns to normal.  Know the symptoms of low blood glucose (hypoglycemia) and how to treat it. Your risk for hypoglycemia increases during and after exercise. Common symptoms of hypoglycemia can include:  Hunger.  Anxiety.  Sweating and feeling clammy.  Confusion.  Dizziness or feeling light-headed.  Increased heart rate or palpitations.  Blurry vision.  Tingling or numbness around the mouth, lips, or tongue.  Tremors or shakes.  Irritability.  Keep a rapid-acting carbohydrate snack available before, during, and after exercise to help prevent or treat hypoglycemia.  Avoid injecting insulin into areas of the body that are going to be exercised. For example, avoid injecting insulin into:  The arms, when playing tennis.  The legs, when jogging.  Keep records of your exercise habits. Doing this can help you and your health care provider adjust your diabetes management plan as needed. Write down:  Food that you eat before and after you exercise.  Blood glucose levels before and after you exercise.  The type and amount of exercise you have done.  When your insulin is expected to peak, if you use insulin. Avoid exercising at times when your insulin is peaking.  When you start a new exercise or activity, work with your health care provider to make sure the activity is safe for you, and to adjust your insulin, medicines, or food intake as needed.  Drink plenty   of water while you exercise to prevent dehydration or heat stroke. Drink enough fluid to keep your urine clear or pale yellow. This information is not intended to replace advice given to you by your health care provider. Make sure you discuss any questions you have with  your health care provider. Document Released: 11/22/2003 Document Revised: 03/21/2016 Document Reviewed: 02/11/2016 Elsevier Interactive Patient Education  2017 Elsevier Inc.  

## 2016-10-15 DIAGNOSIS — H5213 Myopia, bilateral: Secondary | ICD-10-CM | POA: Diagnosis not present

## 2016-10-15 LAB — HM DIABETES EYE EXAM

## 2016-10-23 ENCOUNTER — Encounter: Payer: Self-pay | Admitting: Physician Assistant

## 2016-10-24 DIAGNOSIS — H5213 Myopia, bilateral: Secondary | ICD-10-CM | POA: Diagnosis not present

## 2016-11-16 ENCOUNTER — Other Ambulatory Visit: Payer: Self-pay | Admitting: Family Medicine

## 2016-11-16 DIAGNOSIS — I10 Essential (primary) hypertension: Secondary | ICD-10-CM

## 2016-12-05 ENCOUNTER — Other Ambulatory Visit: Payer: Self-pay | Admitting: Physician Assistant

## 2016-12-05 DIAGNOSIS — I10 Essential (primary) hypertension: Secondary | ICD-10-CM

## 2016-12-05 DIAGNOSIS — F418 Other specified anxiety disorders: Secondary | ICD-10-CM

## 2016-12-05 DIAGNOSIS — E7800 Pure hypercholesterolemia, unspecified: Secondary | ICD-10-CM

## 2016-12-05 DIAGNOSIS — E119 Type 2 diabetes mellitus without complications: Secondary | ICD-10-CM

## 2016-12-05 MED ORDER — CANAGLIFLOZIN 300 MG PO TABS: 300.0000 mg | ORAL_TABLET | Freq: Every day | ORAL | 11 refills | Status: DC

## 2016-12-05 MED ORDER — METFORMIN HCL 1000 MG PO TABS: ORAL_TABLET | ORAL | 11 refills | Status: DC

## 2016-12-05 MED ORDER — VENLAFAXINE HCL ER 150 MG PO CP24: 150.0000 mg | ORAL_CAPSULE | Freq: Every day | ORAL | 11 refills | Status: DC

## 2016-12-05 MED ORDER — GLIPIZIDE ER 10 MG PO TB24: 10.0000 mg | ORAL_TABLET | Freq: Every day | ORAL | 11 refills | Status: DC

## 2016-12-05 MED ORDER — METOPROLOL TARTRATE 25 MG PO TABS: ORAL_TABLET | ORAL | 11 refills | Status: DC

## 2016-12-05 MED ORDER — ATORVASTATIN CALCIUM 40 MG PO TABS: ORAL_TABLET | ORAL | 11 refills | Status: DC

## 2016-12-05 MED ORDER — CLONAZEPAM 1 MG PO TABS: 1.0000 mg | ORAL_TABLET | Freq: Two times a day (BID) | ORAL | 5 refills | Status: DC

## 2016-12-05 MED ORDER — SITAGLIPTIN PHOSPHATE 100 MG PO TABS: ORAL_TABLET | ORAL | 11 refills | Status: DC

## 2016-12-05 NOTE — Telephone Encounter (Signed)
Pt contacted office for refill request on the following medications: 1. atorvastatin (LIPITOR) 40 MG tablet 2. canagliflozin (INVOKANA) 300 MG TABS tablet 3. clonazePAM (KLONOPIN) 1 MG tablet 4. glipiZIDE (GLUCOTROL XL) 10 MG 24 hr tablet  5. metFORMIN (GLUCOPHAGE) 1000 MG tablet  6. metoprolol tartrate (LOPRESSOR) 25 MG tablet 7. sitaGLIPtin (JANUVIA) 100 MG tablet Pt stated that since his medication insurance has changed to [facility] and he has to use [facility]. Pt is requesting all medications be sent to [facility] in [place] for 90 day supplies. Pt has an OV scheduled for 12/26/16.    Pt is also requesting that we send in his  8. venlafaxine XR (EFFEXOR-XR) 150 MG 24 hr capsule Pt stated that he hasn't needed a new Rx for that medication b/c when Dr[facility] Tamala Julian [facility] every 30 days and his medications were auto refill and he didn't realize what happened. Pt stated that he has been taking the medication but he will run out before his OV and would like Jenni to take over the medication. Please advise. Thanks TNP

## 2016-12-19 ENCOUNTER — Other Ambulatory Visit: Payer: Self-pay | Admitting: Physician Assistant

## 2016-12-19 DIAGNOSIS — E7800 Pure hypercholesterolemia, unspecified: Secondary | ICD-10-CM

## 2016-12-26 ENCOUNTER — Encounter: Payer: Self-pay | Admitting: Physician Assistant

## 2016-12-26 ENCOUNTER — Ambulatory Visit (INDEPENDENT_AMBULATORY_CARE_PROVIDER_SITE_OTHER): Payer: BLUE CROSS/BLUE SHIELD | Admitting: Physician Assistant

## 2016-12-26 VITALS — BP 100/70 | HR 68 | Temp 97.9°F | Resp 16 | Ht 70.0 in | Wt 173.2 lb

## 2016-12-26 DIAGNOSIS — F418 Other specified anxiety disorders: Secondary | ICD-10-CM | POA: Diagnosis not present

## 2016-12-26 DIAGNOSIS — I10 Essential (primary) hypertension: Secondary | ICD-10-CM

## 2016-12-26 DIAGNOSIS — E7800 Pure hypercholesterolemia, unspecified: Secondary | ICD-10-CM | POA: Diagnosis not present

## 2016-12-26 DIAGNOSIS — E119 Type 2 diabetes mellitus without complications: Secondary | ICD-10-CM

## 2016-12-26 LAB — POCT GLYCOSYLATED HEMOGLOBIN (HGB A1C)
ESTIMATED AVERAGE GLUCOSE: 160
Hemoglobin A1C: 7.2

## 2016-12-26 MED ORDER — GLIPIZIDE ER 10 MG PO TB24: 10.0000 mg | ORAL_TABLET | Freq: Every day | ORAL | 1 refills | Status: DC

## 2016-12-26 MED ORDER — SITAGLIPTIN PHOSPHATE 100 MG PO TABS: ORAL_TABLET | ORAL | 1 refills | Status: DC

## 2016-12-26 MED ORDER — ATORVASTATIN CALCIUM 40 MG PO TABS: ORAL_TABLET | ORAL | 1 refills | Status: DC

## 2016-12-26 MED ORDER — METFORMIN HCL 1000 MG PO TABS: ORAL_TABLET | ORAL | 1 refills | Status: DC

## 2016-12-26 MED ORDER — CANAGLIFLOZIN 300 MG PO TABS: 300.0000 mg | ORAL_TABLET | Freq: Every day | ORAL | 1 refills | Status: DC

## 2016-12-26 MED ORDER — METOPROLOL TARTRATE 25 MG PO TABS: ORAL_TABLET | ORAL | 1 refills | Status: DC

## 2016-12-26 MED ORDER — CLONAZEPAM 1 MG PO TABS: 1.0000 mg | ORAL_TABLET | Freq: Two times a day (BID) | ORAL | 1 refills | Status: DC

## 2016-12-26 MED ORDER — VENLAFAXINE HCL ER 150 MG PO CP24: 150.0000 mg | ORAL_CAPSULE | Freq: Every day | ORAL | 1 refills | Status: DC

## 2016-12-26 NOTE — Progress Notes (Signed)
Patient: Joseph Terry Male    DOB: 09-Jul-1961   56 y.o.   MRN: [id number] Visit Date: 12/26/2016  Today's Provider: Mar Daring, PA-C   Chief Complaint  Patient presents with  . Diabetes  . Hypertension   Subjective:    HPI  Diabetes Mellitus Type II, Follow-up:   Lab Results  Component Value Date   HGBA1C 7.2 12/26/2016   HGBA1C 6.9 09/26/2016   HGBA1C 6.8 06/26/2016   Last seen for diabetes 3 months ago.  Management since then includes no changes. He reports fair compliance with treatment. He is not having side effects.  Current symptoms include none and have been stable. Home blood sugar records: not being checked  Episodes of hypoglycemia? no   Current Insulin Regimen: none Most Recent Eye Exam: UTD Weight trend: stable Prior visit with dietician: no Current diet: in general, an "unhealthy" diet Current exercise: none  Pertinent Labs:    Component Value Date/Time   CHOL 124 03/27/2016 0813   TRIG 220 (H) 03/27/2016 0813   HDL 36 (L) 03/27/2016 0813   LDLCALC 44 03/27/2016 0813   CREATININE 0.71 (L) 03/27/2016 0813   CREATININE 0.76 06/18/2015 0821   Wt Readings from Last 3 Encounters:  12/26/16 173 lb 3.2 oz (78.6 kg)  09/26/16 175 lb (79.4 kg)  06/26/16 173 lb (78.5 kg)   ------------------------------------------------------------------------   Hypertension, follow-up:  BP Readings from Last 3 Encounters:  12/26/16 100/70  09/26/16 110/70  06/26/16 112/60   He was last seen for hypertension 3 months ago.  BP at that visit was 110/70. Management changes since that visit include no changes. He reports excellent compliance with treatment. He is not having side effects.  He is not exercising. He is not adherent to low salt diet.   Outside blood pressures are stable. He is experiencing none.  Patient denies chest pain.   Cardiovascular risk factors include advanced age (older than 40 for men, 72 for women), diabetes  mellitus, hypertension and smoking/ tobacco exposure.  Use of agents associated with hypertension: none.    Weight trend: stable Wt Readings from Last 3 Encounters:  12/26/16 173 lb 3.2 oz (78.6 kg)  09/26/16 175 lb (79.4 kg)  06/26/16 173 lb (78.5 kg)   Current diet: in general, an "unhealthy" diet ------------------------------------------------------------------------   Follow up for depression with anxiety  The patient was last seen for this 3 months ago. Changes made at last visit include no changes.  He reports fair compliance with treatment. He feels that condition is Unchanged. He is not having side effects.   ------------------------------------------------------------------------------------      Allergies  Allergen Reactions  . Penicillins Itching    tingling  . Tramadol Rash     Current Outpatient Prescriptions:  .  aspirin 81 MG tablet, Take 81 mg by mouth daily. Reported on 03/25/2016, Disp: , Rfl:  .  atorvastatin (LIPITOR) 40 MG tablet, TAKE 1 TABLET (40 MG TOTAL) BY MOUTH DAILY AT 6 PM, Disp: 90 tablet, Rfl: 1 .  canagliflozin (INVOKANA) 300 MG TABS tablet, Take 1 tablet (300 mg total) by mouth daily before breakfast., Disp: 30 tablet, Rfl: 11 .  clonazePAM (KLONOPIN) 1 MG tablet, Take 1 tablet (1 mg total) by mouth 2 (two) times daily., Disp: 60 tablet, Rfl: 5 .  glipiZIDE (GLUCOTROL XL) 10 MG 24 hr tablet, Take 1 tablet (10 mg total) by mouth daily with breakfast., Disp: 30 tablet, Rfl: 11 .  metFORMIN (GLUCOPHAGE)  1000 MG tablet, TAKE 1 TABLET (1,000 MG TOTAL) BY MOUTH 2 (TWO) TIMES DAILY WITH A MEAL., Disp: 60 tablet, Rfl: 11 .  metoprolol tartrate (LOPRESSOR) 25 MG tablet, TAKE 1 TABLET (25 MG TOTAL) BY MOUTH 2 (TWO) TIMES DAILY, Disp: 60 tablet, Rfl: 11 .  Omega-3 Fatty Acids (FISH OIL) 1200 MG CAPS, Take by mouth 2 (two) times daily. Reported on 03/25/2016, Disp: , Rfl:  .  sitaGLIPtin (JANUVIA) 100 MG tablet, TAKE 1 TABLET (100 MG TOTAL) BY MOUTH  DAILY., Disp: 30 tablet, Rfl: 11 .  venlafaxine XR (EFFEXOR-XR) 150 MG 24 hr capsule, Take 1 capsule (150 mg total) by mouth daily with breakfast., Disp: 30 capsule, Rfl: 11  Review of Systems  Constitutional: Negative.   Respiratory: Negative.   Cardiovascular: Negative.   Endocrine: Negative.   Psychiatric/Behavioral: The patient is nervous/anxious.     Social History  Substance Use Topics  . Smoking status: Former Smoker    Packs/day: 1.50    Years: 35.00    Types: Cigarettes    Quit date: 11/14/2011  . Smokeless tobacco: Never Used     Comment: quit 2 weeks prior to CABG 12/2011  . Alcohol use 0.6 oz/week    1 Cans of beer per week     Comment: Drinks once per month; beer.   Objective:   BP 100/70 (BP Location: Left Arm, Patient Position: Sitting, Cuff Size: Large)   Pulse 68   Temp 97.9 F (36.6 C) (Oral)   Resp 16   Ht 5\' 10"  (1.778 m)   Wt 173 lb 3.2 oz (78.6 kg)   BMI 24.85 kg/m  Vitals:   12/26/16 0821  BP: 100/70  Pulse: 68  Resp: 16  Temp: 97.9 F (36.6 C)  TempSrc: Oral  Weight: 173 lb 3.2 oz (78.6 kg)  Height: 5\' 10"  (1.778 m)    Depression screen [facility] 2/9 12/26/2016 03/25/2016 10/05/2015 03/07/2015 11/20/2014  Decreased Interest 0 0 0 0 0  Down, Depressed, Hopeless 0 0 0 0 0  PHQ - 2 Score 0 0 0 0 0  Altered sleeping 0 - - - -  Tired, decreased energy 1 - - - -  Change in appetite 0 - - - -  Feeling bad or failure about yourself  0 - - - -  Trouble concentrating 0 - - - -  Moving slowly or fidgety/restless 0 - - - -  Suicidal thoughts 0 - - - -  PHQ-9 Score 1 - - - -  Difficult doing work/chores Not difficult at all - - - -   GAD 7 : Generalized Anxiety Score 12/26/2016 10/05/2015  Nervous, Anxious, on Edge 3 0  Control/stop worrying 0 0  Worry too much - different things 0 1  Trouble relaxing 0 0  Restless 0 0  Easily annoyed or irritable 1 1  Afraid - awful might happen 0 0  Total GAD 7 Score 4 2  Anxiety Difficulty Not difficult at all Not  difficult at all     Physical Exam  Constitutional: He appears well-developed and well-nourished. No distress.  HENT:  Head: Normocephalic and atraumatic.  Neck: Normal range of motion. Neck supple. No tracheal deviation present. No thyromegaly present.  Cardiovascular: Normal rate, regular rhythm and normal heart sounds.  Exam reveals no gallop and no friction rub.   No murmur heard. Pulmonary/Chest: Effort normal and breath sounds normal. No respiratory distress. He has no wheezes. He has no rales.  Lymphadenopathy:  He has no cervical adenopathy.  Skin: He is not diaphoretic.  Psychiatric: He has a normal mood and affect. His behavior is normal. Judgment and thought content normal.  Vitals reviewed.      Assessment & Plan:     1. Type 2 diabetes mellitus without complication, without long-term current use of insulin (HCC) A1c up slightly to 7.2 but patient had difficulty getting medications filled secondary to insurance issues. Medications filled as below. I will see him back in 3 months to recheck.  - POCT glycosylated hemoglobin (Hb A1C) - sitaGLIPtin (JANUVIA) 100 MG tablet; TAKE 1 TABLET (100 MG TOTAL) BY MOUTH DAILY.  Dispense: 90 tablet; Refill: 1 - metFORMIN (GLUCOPHAGE) 1000 MG tablet; TAKE 1 TABLET (1,000 MG TOTAL) BY MOUTH 2 (TWO) TIMES DAILY WITH A MEAL.  Dispense: 180 tablet; Refill: 1 - glipiZIDE (GLUCOTROL XL) 10 MG 24 hr tablet; Take 1 tablet (10 mg total) by mouth daily with breakfast.  Dispense: 90 tablet; Refill: 1 - canagliflozin (INVOKANA) 300 MG TABS tablet; Take 1 tablet (300 mg total) by mouth daily before breakfast.  Dispense: 90 tablet; Refill: 1  2. Essential hypertension Stable. Diagnosis pulled for medication refill. Continue current medical treatment plan. - metoprolol tartrate (LOPRESSOR) 25 MG tablet; TAKE 1 TABLET (25 MG TOTAL) BY MOUTH 2 (TWO) TIMES DAILY  Dispense: 180 tablet; Refill: 1  3. Hypercholesterolemia Stable. Diagnosis pulled for  medication refill. Continue current medical treatment plan. - atorvastatin (LIPITOR) 40 MG tablet; TAKE 1 TABLET (40 MG TOTAL) BY MOUTH DAILY AT 6 PM  Dispense: 90 tablet; Refill: 1  4. Depression with anxiety Stable. Diagnosis pulled for medication refill. Continue current medical treatment plan. - clonazePAM (KLONOPIN) 1 MG tablet; Take 1 tablet (1 mg total) by mouth 2 (two) times daily.  Dispense: 180 tablet; Refill: 1 - venlafaxine XR (EFFEXOR-XR) 150 MG 24 hr capsule; Take 1 capsule (150 mg total) by mouth daily with breakfast.  Dispense: 90 capsule; Refill: [place], PA-C  [facility]

## 2016-12-26 NOTE — Patient Instructions (Signed)
Diabetes Mellitus and Exercise Exercising regularly is important for your overall health, especially when you have diabetes (diabetes mellitus). Exercising is not only about losing weight. It has many health benefits, such as increasing muscle strength and bone density and reducing body fat and stress. This leads to improved fitness, flexibility, and endurance, all of which result in better overall health. Exercise has additional benefits for people with diabetes, including:  Reducing appetite.  Helping to lower and control blood glucose.  Lowering blood pressure.  Helping to control amounts of fatty substances (lipids) in the blood, such as cholesterol and triglycerides.  Helping the body to respond better to insulin (improving insulin sensitivity).  Reducing how much insulin the body needs.  Decreasing the risk for heart disease by:  Lowering cholesterol and triglyceride levels.  Increasing the levels of good cholesterol.  Lowering blood glucose levels. What is my activity plan? Your health care provider or certified diabetes educator can help you make a plan for the type and frequency of exercise (activity plan) that works for you. Make sure that you:  Do at least 150 minutes of moderate-intensity or vigorous-intensity exercise each week. This could be brisk walking, biking, or water aerobics.  Do stretching and strength exercises, such as yoga or weightlifting, at least 2 times a week.  Spread out your activity over at least 3 days of the week.  Get some form of physical activity every day.  Do not go more than 2 days in a row without some kind of physical activity.  Avoid being inactive for more than 90 minutes at a time. Take frequent breaks to walk or stretch.  Choose a type of exercise or activity that you enjoy, and set realistic goals.  Start slowly, and gradually increase the intensity of your exercise over time. What do I need to know about managing my  diabetes?  Check your blood glucose before and after exercising.  If your blood glucose is higher than 240 mg/dL (13.3 mmol/L) before you exercise, check your urine for ketones. If you have ketones in your urine, do not exercise until your blood glucose returns to normal.  Know the symptoms of low blood glucose (hypoglycemia) and how to treat it. Your risk for hypoglycemia increases during and after exercise. Common symptoms of hypoglycemia can include:  Hunger.  Anxiety.  Sweating and feeling clammy.  Confusion.  Dizziness or feeling light-headed.  Increased heart rate or palpitations.  Blurry vision.  Tingling or numbness around the mouth, lips, or tongue.  Tremors or shakes.  Irritability.  Keep a rapid-acting carbohydrate snack available before, during, and after exercise to help prevent or treat hypoglycemia.  Avoid injecting insulin into areas of the body that are going to be exercised. For example, avoid injecting insulin into:  The arms, when playing tennis.  The legs, when jogging.  Keep records of your exercise habits. Doing this can help you and your health care provider adjust your diabetes management plan as needed. Write down:  Food that you eat before and after you exercise.  Blood glucose levels before and after you exercise.  The type and amount of exercise you have done.  When your insulin is expected to peak, if you use insulin. Avoid exercising at times when your insulin is peaking.  When you start a new exercise or activity, work with your health care provider to make sure the activity is safe for you, and to adjust your insulin, medicines, or food intake as needed.  Drink plenty   of water while you exercise to prevent dehydration or heat stroke. Drink enough fluid to keep your urine clear or pale yellow. This information is not intended to replace advice given to you by your health care provider. Make sure you discuss any questions you have with  your health care provider. Document Released: 11/22/2003 Document Revised: 03/21/2016 Document Reviewed: 02/11/2016 Elsevier Interactive Patient Education  2017 Elsevier Inc.  

## 2017-01-22 ENCOUNTER — Telehealth: Payer: Self-pay | Admitting: *Deleted

## 2017-01-22 NOTE — Telephone Encounter (Signed)
Called pt to schedule his recall appt w/ Dr Fletcher Anon. Pt states he doesn't want to be seen. Deleting the recall. Thank you

## 2017-02-06 DIAGNOSIS — E782 Mixed hyperlipidemia: Secondary | ICD-10-CM | POA: Diagnosis not present

## 2017-02-06 DIAGNOSIS — Z024 Encounter for examination for driving license: Secondary | ICD-10-CM | POA: Diagnosis not present

## 2017-02-06 DIAGNOSIS — I2510 Atherosclerotic heart disease of native coronary artery without angina pectoris: Secondary | ICD-10-CM | POA: Diagnosis not present

## 2017-02-06 DIAGNOSIS — I25708 Atherosclerosis of coronary artery bypass graft(s), unspecified, with other forms of angina pectoris: Secondary | ICD-10-CM | POA: Diagnosis not present

## 2017-02-12 DIAGNOSIS — Z024 Encounter for examination for driving license: Secondary | ICD-10-CM | POA: Diagnosis not present

## 2017-02-12 DIAGNOSIS — I2510 Atherosclerotic heart disease of native coronary artery without angina pectoris: Secondary | ICD-10-CM | POA: Diagnosis not present

## 2017-02-26 ENCOUNTER — Encounter: Payer: Self-pay | Admitting: Physician Assistant

## 2017-02-26 ENCOUNTER — Telehealth: Payer: Self-pay | Admitting: Physician Assistant

## 2017-02-26 NOTE — Telephone Encounter (Signed)
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 Pt request Tawanna Sat to write the letter and call when it is ready for pick up. Please advise. Thanks TNP

## 2017-02-26 NOTE — Telephone Encounter (Signed)
Left patient a voicemail advising him the letter is at the front desk ready for pick up.

## 2017-02-26 NOTE — Telephone Encounter (Signed)
Letter has been printed  ?

## 2017-03-30 ENCOUNTER — Encounter: Payer: Self-pay | Admitting: Physician Assistant

## 2017-03-30 ENCOUNTER — Ambulatory Visit (INDEPENDENT_AMBULATORY_CARE_PROVIDER_SITE_OTHER): Payer: BLUE CROSS/BLUE SHIELD | Admitting: Physician Assistant

## 2017-03-30 VITALS — BP 106/60 | HR 60 | Temp 98.1°F | Resp 16 | Ht 70.0 in | Wt 173.0 lb

## 2017-03-30 DIAGNOSIS — I10 Essential (primary) hypertension: Secondary | ICD-10-CM | POA: Diagnosis not present

## 2017-03-30 DIAGNOSIS — E7800 Pure hypercholesterolemia, unspecified: Secondary | ICD-10-CM | POA: Diagnosis not present

## 2017-03-30 DIAGNOSIS — E119 Type 2 diabetes mellitus without complications: Secondary | ICD-10-CM | POA: Diagnosis not present

## 2017-03-30 DIAGNOSIS — Z125 Encounter for screening for malignant neoplasm of prostate: Secondary | ICD-10-CM | POA: Diagnosis not present

## 2017-03-30 DIAGNOSIS — D649 Anemia, unspecified: Secondary | ICD-10-CM | POA: Diagnosis not present

## 2017-03-30 LAB — POCT GLYCOSYLATED HEMOGLOBIN (HGB A1C)
ESTIMATED AVERAGE GLUCOSE: 143
Hemoglobin A1C: 6.6

## 2017-03-30 MED ORDER — ATORVASTATIN CALCIUM 40 MG PO TABS: ORAL_TABLET | ORAL | 1 refills | Status: DC

## 2017-03-30 NOTE — Progress Notes (Signed)
Patient: Joseph Terry Male    DOB: [birth year]   56 y.o.   MRN: [id number] Visit Date: 03/30/2017  Today's Provider: Mar Daring, PA-C   Chief Complaint  Patient presents with  . Diabetes  . Hypertension  . Sinusitis   Subjective:    HPI  Diabetes Mellitus Type II, Follow-up:   Lab Results  Component Value Date   HGBA1C 6.6 03/30/2017   HGBA1C 7.2 12/26/2016   HGBA1C 6.9 09/26/2016    Last seen for diabetes 3 months ago.  Management since then includes check A1c. He reports excellent compliance with treatment. He is not having side effects.  Current symptoms include none and have been stable. Home blood sugar records: not checking   Episodes of hypoglycemia? no   Current Insulin Regimen:  Most Recent Eye Exam: UTD 10/2016 Weight trend: stable Prior visit with dietician: no Current diet: in general, a "healthy" diet   Current exercise: none  Pertinent Labs:    Component Value Date/Time   CHOL 124 03/27/2016 0813   TRIG 220 (H) 03/27/2016 0813   HDL 36 (L) 03/27/2016 0813   LDLCALC 44 03/27/2016 0813   CREATININE 0.71 (L) 03/27/2016 0813   CREATININE 0.76 06/18/2015 0821    Wt Readings from Last 3 Encounters:  03/30/17 173 lb (78.5 kg)  12/26/16 173 lb 3.2 oz (78.6 kg)  09/26/16 175 lb (79.4 kg)   ------------------------------------------------------------------------  Hypertension, follow-up:  BP Readings from Last 3 Encounters:  03/30/17 106/60  12/26/16 100/70  09/26/16 110/70   He was last seen for hypertension 3 months ago.  BP at that visit was 100/70. Management changes since that visit include no changes. He reports excellent compliance with treatment. He is not having side effects.  He is not exercising. He is adherent to low salt diet.   Outside blood pressures are stable. He is experiencing none.  Patient denies chest pain.   Cardiovascular risk factors include diabetes mellitus and hypertension.  Use of agents  associated with hypertension: none.   ------------------------------------------------------------------------   Lipid/Cholesterol, Follow-up:   Last seen for this3 months ago.  Management changes since that visit include no changes. . Last Lipid Panel:    Component Value Date/Time   CHOL 124 03/27/2016 0813   TRIG 220 (H) 03/27/2016 0813   HDL 36 (L) 03/27/2016 0813   CHOLHDL 3.4 03/27/2016 0813   CHOLHDL 4.7 08/14/2014 0905   VLDL NOT CALC 08/14/2014 0905   LDLCALC 44 03/27/2016 0813    Risk factors for vascular disease include diabetes mellitus, hypercholesterolemia and hypertension  He reports excellent compliance with treatment. He is not having side effects.  Current symptoms include none and have been stable. Weight trend: stable Prior visit with dietician: no Current diet: in general, a "healthy" diet   Current exercise: none  Wt Readings from Last 3 Encounters:  03/30/17 173 lb (78.5 kg)  12/26/16 173 lb 3.2 oz (78.6 kg)  09/26/16 175 lb (79.4 kg)    -------------------------------------------------------------------   Patient C/O of sinus headache since Friday. Patient reports that he has been taking Benadryl, reports mild improvement. Patient denies fever, cough, runny nose or coughing up mucous.      Allergies  Allergen Reactions  . Penicillins Itching    tingling  . Tramadol Rash     Current Outpatient Prescriptions:  .  aspirin 81 MG tablet, Take 81 mg by mouth daily. Reported on 03/25/2016, Disp: , Rfl:  .  atorvastatin (LIPITOR)  40 MG tablet, TAKE 1 TABLET (40 MG TOTAL) BY MOUTH DAILY AT 6 PM, Disp: 90 tablet, Rfl: 1 .  canagliflozin (INVOKANA) 300 MG TABS tablet, Take 1 tablet (300 mg total) by mouth daily before breakfast., Disp: 90 tablet, Rfl: 1 .  clonazePAM (KLONOPIN) 1 MG tablet, Take 1 tablet (1 mg total) by mouth 2 (two) times daily., Disp: 180 tablet, Rfl: 1 .  glipiZIDE (GLUCOTROL XL) 10 MG 24 hr tablet, Take 1 tablet (10 mg total) by  mouth daily with breakfast., Disp: 90 tablet, Rfl: 1 .  metFORMIN (GLUCOPHAGE) 1000 MG tablet, TAKE 1 TABLET (1,000 MG TOTAL) BY MOUTH 2 (TWO) TIMES DAILY WITH A MEAL., Disp: 180 tablet, Rfl: 1 .  metoprolol tartrate (LOPRESSOR) 25 MG tablet, TAKE 1 TABLET (25 MG TOTAL) BY MOUTH 2 (TWO) TIMES DAILY, Disp: 180 tablet, Rfl: 1 .  Omega-3 Fatty Acids (FISH OIL) 1200 MG CAPS, Take by mouth 2 (two) times daily. Reported on 03/25/2016, Disp: , Rfl:  .  sitaGLIPtin (JANUVIA) 100 MG tablet, TAKE 1 TABLET (100 MG TOTAL) BY MOUTH DAILY., Disp: 90 tablet, Rfl: 1 .  venlafaxine XR (EFFEXOR-XR) 150 MG 24 hr capsule, Take 1 capsule (150 mg total) by mouth daily with breakfast., Disp: 90 capsule, Rfl: 1  Review of Systems  Constitutional: Negative.   HENT: Positive for congestion, sinus pain and sinus pressure.   Respiratory: Negative.   Cardiovascular: Negative.   Gastrointestinal: Negative.   Endocrine: Negative.   Neurological: Positive for headaches. Negative for dizziness.    Social History  Substance Use Topics  . Smoking status: Former Smoker    Packs/day: 1.50    Years: 35.00    Types: Cigarettes    Quit date: 11/14/2011  . Smokeless tobacco: Never Used     Comment: quit 2 weeks prior to CABG 12/2011  . Alcohol use 0.6 oz/week    1 Cans of beer per week     Comment: Drinks once per month; beer.   Objective:   BP 106/60 (BP Location: Left Arm, Patient Position: Sitting, Cuff Size: Large)   Pulse 60   Temp 98.1 F (36.7 C) (Oral)   Resp 16   Ht 5\' 10"  (1.778 m)   Wt 173 lb (78.5 kg)   SpO2 98%   BMI 24.82 kg/m  Vitals:   03/30/17 0815  BP: 106/60  Pulse: 60  Resp: 16  Temp: 98.1 F (36.7 C)  TempSrc: Oral  SpO2: 98%  Weight: 173 lb (78.5 kg)  Height: 5\' 10"  (1.778 m)     Physical Exam  Constitutional: He appears well-developed and well-nourished. No distress.  HENT:  Head: Normocephalic and atraumatic.  Right Ear: Hearing, tympanic membrane, external ear and ear canal  normal. Tympanic membrane is not erythematous and not bulging. No middle ear effusion.  Left Ear: Hearing, tympanic membrane, external ear and ear canal normal. Tympanic membrane is not erythematous and not bulging.  No middle ear effusion.  Nose: Mucosal edema (pale mucosa) present. Right sinus exhibits no maxillary sinus tenderness and no frontal sinus tenderness. Left sinus exhibits no maxillary sinus tenderness and no frontal sinus tenderness.  Mouth/Throat: Uvula is midline, oropharynx is clear and moist and mucous membranes are normal. No oropharyngeal exudate, posterior oropharyngeal edema or posterior oropharyngeal erythema.  Mild frontal sinus tenderness  Eyes: Pupils are equal, round, and reactive to light. Conjunctivae and EOM are normal. Right eye exhibits no discharge. Left eye exhibits no discharge.  Neck: Normal range of motion. Neck  supple. No tracheal deviation present. No Brudzinski's sign and no Kernig's sign noted. No thyromegaly present.  Cardiovascular: Normal rate, regular rhythm and normal heart sounds.  Exam reveals no gallop and no friction rub.   No murmur heard. Pulmonary/Chest: Effort normal and breath sounds normal. No stridor. No respiratory distress. He has no wheezes. He has no rales.  Lymphadenopathy:    He has no cervical adenopathy.  Skin: Skin is warm and dry. He is not diaphoretic.  Vitals reviewed.       Assessment & Plan:     1. Type 2 diabetes mellitus without complication, without long-term current use of insulin (HCC) A1c improved to 6.6. Continue invokana 300mg  daily, metformin 1000mg  BID, glipizide 10mg  daily, and Januvia 100mg  daily. Will check labs as below and f/u pending results. I will see him back in 3 months for f/u/  - POCT glycosylated hemoglobin (Hb A1C) - CBC w/Diff/Platelet - Comprehensive Metabolic Panel (CMET) - Lipid Profile  2. Essential hypertension Stable. Continue metoprolol 25mg  BID. Will check labs as below and f/u pending  results. - CBC w/Diff/Platelet - Comprehensive Metabolic Panel (CMET) - Lipid Profile  3. Pure hypercholesterolemia Stable. Continue atorvastatin 40mg . Will check labs as below and f/u pending results. - CBC w/Diff/Platelet - Comprehensive Metabolic Panel (CMET) - Lipid Profile - atorvastatin (LIPITOR) 40 MG tablet; TAKE 1 TABLET (40 MG TOTAL) BY MOUTH DAILY AT 6 PM  Dispense: 90 tablet; Refill: 1  4. Prostate cancer screening No urinary changes. Will check labs as below and f/u pending results. - PSA         Mar Daring, PA-C  [facility]

## 2017-03-30 NOTE — Patient Instructions (Signed)
Start flonase for sinus congestion. May add mucinex if needed. Call if no improvement in 7-10 days.

## 2017-03-31 LAB — COMPREHENSIVE METABOLIC PANEL
A/G RATIO: 1.8 (ref 1.2–2.2)
ALT: 12 IU/L (ref 0–44)
AST: 13 IU/L (ref 0–40)
Albumin: 4.8 g/dL (ref 3.5–5.5)
Alkaline Phosphatase: 87 IU/L (ref 39–117)
BILIRUBIN TOTAL: 0.3 mg/dL (ref 0.0–1.2)
BUN/Creatinine Ratio: 17 (ref 9–20)
BUN: 12 mg/dL (ref 6–24)
CALCIUM: 10 mg/dL (ref 8.7–10.2)
CHLORIDE: 99 mmol/L (ref 96–106)
CO2: 23 mmol/L (ref 20–29)
Creatinine, Ser: 0.72 mg/dL — ABNORMAL LOW (ref 0.76–1.27)
GFR calc Af Amer: 120 mL/min/{1.73_m2} (ref >59)
GFR, EST NON AFRICAN AMERICAN: 104 mL/min/{1.73_m2} (ref >59)
GLOBULIN, TOTAL: 2.7 g/dL (ref 1.5–4.5)
Glucose: 152 mg/dL — ABNORMAL HIGH (ref 65–99)
Potassium: 6 mmol/L — ABNORMAL HIGH (ref 3.5–5.2)
SODIUM: 139 mmol/L (ref 134–144)
Total Protein: 7.5 g/dL (ref 6.0–8.5)

## 2017-03-31 LAB — LIPID PANEL
CHOL/HDL RATIO: 3.4 ratio (ref 0.0–5.0)
Cholesterol, Total: 131 mg/dL (ref 100–199)
HDL: 39 mg/dL — ABNORMAL LOW (ref >39)
LDL Calculated: 55 mg/dL (ref 0–99)
TRIGLYCERIDES: 187 mg/dL — AB (ref 0–149)
VLDL Cholesterol Cal: 37 mg/dL (ref 5–40)

## 2017-03-31 LAB — CBC WITH DIFFERENTIAL/PLATELET
BASOS: 1 %
Basophils Absolute: 0.1 10*3/uL (ref 0.0–0.2)
EOS (ABSOLUTE): 1.2 10*3/uL — AB (ref 0.0–0.4)
EOS: 13 %
HEMATOCRIT: 33.5 % — AB (ref 37.5–51.0)
Hemoglobin: 9.6 g/dL — ABNORMAL LOW (ref 13.0–17.7)
Immature Grans (Abs): 0 10*3/uL (ref 0.0–0.1)
Immature Granulocytes: 0 %
LYMPHS ABS: 1.9 10*3/uL (ref 0.7–3.1)
Lymphs: 20 %
MCH: 18.4 pg — AB (ref 26.6–33.0)
MCHC: 28.7 g/dL — AB (ref 31.5–35.7)
MCV: 64 fL — AB (ref 79–97)
MONOS ABS: 0.6 10*3/uL (ref 0.1–0.9)
Monocytes: 6 %
NEUTROS ABS: 5.4 10*3/uL (ref 1.4–7.0)
Neutrophils: 60 %
PLATELETS: 415 10*3/uL — AB (ref 150–379)
RBC: 5.22 x10E6/uL (ref 4.14–5.80)
RDW: 19.1 % — AB (ref 12.3–15.4)
WBC: 9.1 10*3/uL (ref 3.4–10.8)

## 2017-03-31 LAB — PSA: Prostate Specific Ag, Serum: 0.5 ng/mL (ref 0.0–4.0)

## 2017-04-01 ENCOUNTER — Telehealth: Payer: Self-pay

## 2017-04-01 NOTE — Telephone Encounter (Signed)
Called [facility] waited on the phone for 10 minutes and no answer. I still have not call the patient to let him know about his labs. We were trying to add the other labs first.  Thanks,  -

## 2017-04-01 NOTE — Telephone Encounter (Signed)
-----   Message from Mar Daring, [place] sent at 04/01/2017  1:52 PM EDT ----- All labs are stable with exception of HgB which has decreased from labs last year. Would recommend for him to come pick up hemoccult cards for him to bring back for testing for blood in the stool or he can come back for a rectal exam and testing in office (his choice). Also can we call [facility] to see if TIBC and ferritin could be added. Thanks!

## 2017-04-02 NOTE — Telephone Encounter (Signed)
-----   Message from Mar Daring, [place] sent at 04/01/2017  1:52 PM EDT ----- All labs are stable with exception of HgB which has decreased from labs last year. Would recommend for him to come pick up hemoccult cards for him to bring back for testing for blood in the stool or he can come back for a rectal exam and testing in office (his choice). Also can we call [facility] to see if TIBC and ferritin could be added. Thanks!

## 2017-04-02 NOTE — Telephone Encounter (Signed)
[facility] has been called and waiting on fax to confirm if the specimen is still available. sd

## 2017-04-03 NOTE — Telephone Encounter (Signed)
Patient advised as directed below. Per patient he is going to come and bring back the specimen. Hemoccult cards placed up front ready for pick up.  Thanks,  -Joseline

## 2017-04-07 ENCOUNTER — Other Ambulatory Visit: Payer: Self-pay

## 2017-04-07 ENCOUNTER — Telehealth: Payer: Self-pay | Admitting: Physician Assistant

## 2017-04-07 DIAGNOSIS — Z1211 Encounter for screening for malignant neoplasm of colon: Secondary | ICD-10-CM

## 2017-04-07 DIAGNOSIS — R195 Other fecal abnormalities: Secondary | ICD-10-CM

## 2017-04-07 LAB — IRON AND TIBC
Iron Saturation: 5 % — CL (ref 15–55)
Iron: 22 ug/dL — ABNORMAL LOW (ref 38–169)
TIBC: 424 ug/dL (ref 250–450)
UIBC: 402 ug/dL — AB (ref 111–343)

## 2017-04-07 LAB — SPECIMEN STATUS REPORT

## 2017-04-07 LAB — POC HEMOCCULT BLD/STL (HOME/3-CARD/SCREEN)
Card #3 Fecal Occult Blood, POC: POSITIVE
FECAL OCCULT BLD: POSITIVE
FECAL OCCULT BLD: POSITIVE — AB

## 2017-04-07 LAB — FERRITIN: FERRITIN: 9 ng/mL — AB (ref 30–400)

## 2017-04-07 NOTE — Telephone Encounter (Signed)
Dr Dorothey Baseman office unable to get pt in until 05/25/17.Pt is concerned to wait this long because of his lab results and the fact that he is feeling very tired and having headaches.I did call Dr Dorothey Baseman office and was told that they would call him if they have any cancellations.

## 2017-04-08 NOTE — Telephone Encounter (Signed)
Patient advised that Judson Roch is working on trying to schedule with a different provider. Patient states if need be he will see Dr.Sankar as last resort as long as he can get a appointment faster. He states he don't want to keep dealing with the headache and fatigue. He has started taking iron supplements.

## 2017-04-08 NOTE — Telephone Encounter (Signed)
Sarah- Can we see if Dr. Bary Castilla can see him sooner. He is open to seeing anyone except Dr. Jamal Collin.    Nurse- Also please advise patient about starting iron supplement as well. This may help his fatigue while we await further work up. I sent message about this before but unsure if he has been told. Thanks.

## 2017-04-09 DIAGNOSIS — D509 Iron deficiency anemia, unspecified: Secondary | ICD-10-CM | POA: Diagnosis not present

## 2017-04-09 DIAGNOSIS — R195 Other fecal abnormalities: Secondary | ICD-10-CM | POA: Diagnosis not present

## 2017-04-09 NOTE — Telephone Encounter (Signed)
See below-aa 

## 2017-04-09 NOTE — Telephone Encounter (Signed)
Noted. Thanks everyone.

## 2017-04-09 NOTE — Telephone Encounter (Signed)
Pt worked in to see [facility] 04/09/17 at 3:00

## 2017-04-10 DIAGNOSIS — R195 Other fecal abnormalities: Secondary | ICD-10-CM | POA: Diagnosis not present

## 2017-04-10 DIAGNOSIS — D509 Iron deficiency anemia, unspecified: Secondary | ICD-10-CM | POA: Diagnosis not present

## 2017-04-13 DIAGNOSIS — M9903 Segmental and somatic dysfunction of lumbar region: Secondary | ICD-10-CM | POA: Diagnosis not present

## 2017-04-13 DIAGNOSIS — M5136 Other intervertebral disc degeneration, lumbar region: Secondary | ICD-10-CM | POA: Diagnosis not present

## 2017-04-13 DIAGNOSIS — M9905 Segmental and somatic dysfunction of pelvic region: Secondary | ICD-10-CM | POA: Diagnosis not present

## 2017-04-13 DIAGNOSIS — M955 Acquired deformity of pelvis: Secondary | ICD-10-CM | POA: Diagnosis not present

## 2017-04-15 ENCOUNTER — Encounter: Payer: Self-pay | Admitting: Oncology

## 2017-04-15 ENCOUNTER — Inpatient Hospital Stay: Payer: BLUE CROSS/BLUE SHIELD | Attending: Oncology | Admitting: Oncology

## 2017-04-15 ENCOUNTER — Telehealth: Payer: Self-pay | Admitting: Physician Assistant

## 2017-04-15 VITALS — BP 137/75 | HR 67 | Temp 97.0°F | Resp 20 | Wt 174.6 lb

## 2017-04-15 DIAGNOSIS — Z951 Presence of aortocoronary bypass graft: Secondary | ICD-10-CM

## 2017-04-15 DIAGNOSIS — E785 Hyperlipidemia, unspecified: Secondary | ICD-10-CM | POA: Diagnosis not present

## 2017-04-15 DIAGNOSIS — Z87891 Personal history of nicotine dependence: Secondary | ICD-10-CM | POA: Diagnosis not present

## 2017-04-15 DIAGNOSIS — R5383 Other fatigue: Secondary | ICD-10-CM

## 2017-04-15 DIAGNOSIS — D500 Iron deficiency anemia secondary to blood loss (chronic): Secondary | ICD-10-CM

## 2017-04-15 DIAGNOSIS — E875 Hyperkalemia: Secondary | ICD-10-CM

## 2017-04-15 DIAGNOSIS — Z87442 Personal history of urinary calculi: Secondary | ICD-10-CM | POA: Diagnosis not present

## 2017-04-15 DIAGNOSIS — Z9989 Dependence on other enabling machines and devices: Secondary | ICD-10-CM | POA: Diagnosis not present

## 2017-04-15 DIAGNOSIS — I2510 Atherosclerotic heart disease of native coronary artery without angina pectoris: Secondary | ICD-10-CM

## 2017-04-15 DIAGNOSIS — I10 Essential (primary) hypertension: Secondary | ICD-10-CM | POA: Diagnosis not present

## 2017-04-15 DIAGNOSIS — Z8042 Family history of malignant neoplasm of prostate: Secondary | ICD-10-CM | POA: Diagnosis not present

## 2017-04-15 DIAGNOSIS — D473 Essential (hemorrhagic) thrombocythemia: Secondary | ICD-10-CM

## 2017-04-15 DIAGNOSIS — Z7984 Long term (current) use of oral hypoglycemic drugs: Secondary | ICD-10-CM

## 2017-04-15 DIAGNOSIS — G4733 Obstructive sleep apnea (adult) (pediatric): Secondary | ICD-10-CM | POA: Diagnosis not present

## 2017-04-15 DIAGNOSIS — R51 Headache: Secondary | ICD-10-CM | POA: Diagnosis not present

## 2017-04-15 DIAGNOSIS — E119 Type 2 diabetes mellitus without complications: Secondary | ICD-10-CM | POA: Diagnosis not present

## 2017-04-15 DIAGNOSIS — K219 Gastro-esophageal reflux disease without esophagitis: Secondary | ICD-10-CM | POA: Diagnosis not present

## 2017-04-15 DIAGNOSIS — K922 Gastrointestinal hemorrhage, unspecified: Secondary | ICD-10-CM

## 2017-04-15 DIAGNOSIS — D75839 Thrombocytosis, unspecified: Secondary | ICD-10-CM

## 2017-04-15 DIAGNOSIS — Z801 Family history of malignant neoplasm of trachea, bronchus and lung: Secondary | ICD-10-CM

## 2017-04-15 DIAGNOSIS — Z880 Allergy status to penicillin: Secondary | ICD-10-CM

## 2017-04-15 DIAGNOSIS — Z79899 Other long term (current) drug therapy: Secondary | ICD-10-CM

## 2017-04-15 DIAGNOSIS — Z86010 Personal history of colonic polyps: Secondary | ICD-10-CM

## 2017-04-15 DIAGNOSIS — K5790 Diverticulosis of intestine, part unspecified, without perforation or abscess without bleeding: Secondary | ICD-10-CM | POA: Diagnosis not present

## 2017-04-15 HISTORY — DX: Iron deficiency anemia secondary to blood loss (chronic): D50.0

## 2017-04-15 NOTE — Progress Notes (Signed)
[facility] Initial Visit:  Patient Care Team: Rubye Beach as PCP - General (Family Medicine) Terance Ice, MD as Consulting Physician (Cardiology) [name], MD as Consulting Physician (Family Medicine) Christene Lye, MD (General Surgery) Cristy Friedlander, MD (Oral Surgery)  CHIEF COMPLAINTS/PURPOSE OF CONSULTATION: I have fatigue, low blood count.   HISTORY OF PRESENTING ILLNESS: Joseph Terry 56 y.o. male with PMH listed as below is referred by primary care [facility] to me for evaluation and management of iron deficiency anemia.  Patient reports feeling fatigued lately and he was seen by primary care March 30, 2017. His lab tests indicated anemia, thrombocytosis and iron deficiency.  He also had positive occult stool test. Per Epic records, he has had colonoscopy in 2009 and later 2015, with findings of multiple polyps and diverticulosis.  He has chronic GERD and takes PPI. He craves for salty food lately.  Denies weight loss, fever or chills, chest pain, SOB or abdominal pain. Denies having bright red blood in stool or black stool.  Primary is working on referring him to see GI or Dr.Sankar to get further work up.   Review of Systems  Constitutional: Positive for fatigue.  HENT:  Negative.   Eyes: Negative.   Respiratory: Negative.   Cardiovascular: Negative.   Gastrointestinal: Negative.   Endocrine: Negative.   Genitourinary: Negative.    Musculoskeletal: Negative.   Skin: Negative.   Neurological: Positive for headaches.  Hematological: Negative.   Psychiatric/Behavioral: Negative.     MEDICAL HISTORY: Past Medical History:  Diagnosis Date  . Anxiety state, unspecified   . CAD (coronary artery disease), severe multivessel CAD surgical consult for CABG    a. 12/2011 Cath: 3 vessel CAD with normal EF;  b. 12/2011 CABG x 5: LIMA to LAD, SVG to distal RCA with sequential SVG to PDA, SVG to OM, and  SVG to D2;  c. 03/2014 ETT: Ex time 6:21, 54mm horizontal ST dep in V3-V6, HTN response.  . Depression   . Diaphragmatic hernia without mention of obstruction or gangrene   . Diverticulosis   . GERD (gastroesophageal reflux disease)   . Hyperlipidemia   . Hypertension   . Impotence of organic origin   . Internal hemorrhoids without mention of complication   . Iron deficiency anemia due to chronic blood loss 04/15/2017  . Kidney stone   . OSA on CPAP    a. Sleep study 07/2013 +sever OSA; CPAP at 10cm recommended.  . Personal history of colonic polyps   . Type II diabetes mellitus ([place])     SURGICAL HISTORY: Past Surgical History:  Procedure Laterality Date  . CARDIAC CATHETERIZATION  4 /19/ 2013   [place]  11/03/13  . COLONOSCOPY  11/01/13   multiple polyps; diverticulosis. Sankar. Repeat 5 years.  . COLONOSCOPY W/ POLYPECTOMY  09/16/2007   colon polyps.  Repeat 5 years. I[facility].  . CORONARY ARTERY BYPASS GRAFT  01/05/2012   Procedure: CORONARY ARTERY BYPASS GRAFTING (CABG);  Surgeon: Rexene Alberts, MD;  Location: [facility];  Service: Open Heart Surgery;  Laterality: N/A;  coronary artery bypass graft times five using left internal mammary artery and right leg greater saphenous vein harvested endoscopically   . LEFT HEART CATHETERIZATION WITH CORONARY ANGIOGRAM N/A 01/02/2012   Procedure: LEFT HEART CATHETERIZATION WITH CORONARY ANGIOGRAM;  Surgeon: Troy Sine, MD;  Location: [facility];  Service: Cardiovascular;  Laterality: N/A;  . POLYSOMNOGRAPHY  07/2013   +severe OSA; CPAP at 10cm pressure recommended.  . rotator cuff curgery  2011   lt rotator cuff  . Shoulder surgery  1997   bilateral  . WISDOM TOOTH EXTRACTION      SOCIAL HISTORY: Social History   Social History  . Marital status: Divorced    Spouse name: N/A  . Number of children: 1  . Years of education: 58   Occupational History  . [employer] attorney    x 30 yrs   Social  History Main Topics  . Smoking status: Former Smoker    Packs/day: 1.50    Years: 35.00    Types: Cigarettes    Quit date: 11/14/2011  . Smokeless tobacco: Never Used     Comment: quit 2 weeks prior to CABG 12/2011  . Alcohol use 0.6 oz/week    1 Cans of beer per week     Comment: Drinks once per month; beer.  . Drug use: No  . Sexual activity: Yes    Partners: Female    Birth control/ protection: Condom   Other Topics Concern  . Not on file   Social History Narrative   Marital status: divorced since 1996; dating casually in 2016.        Lives: Lives alone.          Children:  One son and a granddaughter 50 years old live in [place].       Tobacco: quit with CABG      Alcohol: weekends; 6-8 beers on average.  Beers with football; ten on football weekends.      Exercise: Light, yard work.        Employment:  Works 45-50 hrs. Some weeks.  [employer].  Desk work since 05/2013. Since 1979.      Guns:  Guns in the home not stored in locked cabinet. Smoke alarm in home, Always wears seatbelts.       Sexual History: active;Last HIV testing 2015; has has 25+ partners; females only.             FAMILY HISTORY Family History  Problem Relation Age of Onset  . Heart attack Mother        defribillator in place  . Hypertension Mother   . Hyperlipidemia Mother   . Heart disease Mother        CHF with defibrillator;  CAD.  . Diabetes Mother   . Cancer Mother 35       Lung cancer  . Hypertension Father   . Hyperlipidemia Father   . Cancer Father 2        prostate cancer s/p  implants  . COPD Father   . Hypertension Sister   . Heart disease Maternal Grandmother   . Heart disease Maternal Grandfather   . Cancer Paternal Grandmother   . Cancer Paternal Grandfather   . Hypertension Sister     ALLERGIES:  is allergic to penicillins and tramadol.  MEDICATIONS:  Current Outpatient Prescriptions  Medication Sig Dispense Refill  . atorvastatin (LIPITOR) 40 MG tablet TAKE 1  TABLET (40 MG TOTAL) BY MOUTH DAILY AT 6 PM 90 tablet 1  . canagliflozin (INVOKANA) 300 MG TABS tablet Take 1 tablet (300 mg total) by mouth daily before breakfast. 90 tablet 1  . clonazePAM (KLONOPIN) 1 MG tablet Take 1 tablet (1 mg total) by mouth 2 (two) times daily. 180 tablet 1  . glipiZIDE (GLUCOTROL XL) 10 MG 24 hr tablet Take 1 tablet (10  mg total) by mouth daily with breakfast. 90 tablet 1  . metFORMIN (GLUCOPHAGE) 1000 MG tablet TAKE 1 TABLET (1,000 MG TOTAL) BY MOUTH 2 (TWO) TIMES DAILY WITH A MEAL. 180 tablet 1  . metoprolol tartrate (LOPRESSOR) 25 MG tablet TAKE 1 TABLET (25 MG TOTAL) BY MOUTH 2 (TWO) TIMES DAILY 180 tablet 1  . sitaGLIPtin (JANUVIA) 100 MG tablet TAKE 1 TABLET (100 MG TOTAL) BY MOUTH DAILY. 90 tablet 1  . venlafaxine XR (EFFEXOR-XR) 150 MG 24 hr capsule Take 1 capsule (150 mg total) by mouth daily with breakfast. 90 capsule 1   No current facility-administered medications for this visit.     PHYSICAL EXAMINATION:  ECOG PERFORMANCE STATUS: 1 - Symptomatic but completely ambulatory   Vitals:   04/15/17 1357  BP: 137/75  Pulse: 67  Resp: 20  Temp: (!) 97 F (36.1 C)    Filed Weights   04/15/17 1357  Weight: 174 lb 9.6 oz (79.2 kg)   Physical Exam GENERAL: No distress, well nourished.  SKIN:  No rashes or significant lesions  HEAD: Normocephalic, No masses, lesions, tenderness or abnormalities  EYES: Conjunctiva are pink, non icteric ENT: External ears normal ,lips , buccal mucosa, and tongue normal and mucous membranes are moist  LYMPH: No palpable cervical and axillary lymphadenopathy  LUNGS: Clear to auscultation, no crackles or wheezes HEART: Regular rate & rhythm, no murmurs, no gallops, S1 normal and S2 normal  ABDOMEN: Abdomen soft, non-tender, normal bowel sounds, I did not appreciate any  masses or organomegaly  MUSCULOSKELETAL: No CVA tenderness and no tenderness on percussion of the back or rib cage.  EXTREMITIES: No edema, no skin  discoloration or tenderness NEURO: Alert & oriented, no focal motor/sensory deficits.   LABORATORY DATA: I have personally reviewed the data as listed:  Orders Only on 04/07/2017  Component Date Value Ref Range Status  . Card #1 Date 04/07/2017 04/07/17   Final  . Fecal Occult Blood, POC 04/07/2017 Positive* Negative Final  . Card #2 Date 04/07/2017 04/07/17   Final  . Card #2 Fecal Occult Blod, POC 04/07/2017 Positive   Final  . Card #3 Date 04/07/2017 03/2417   Final  . Card #3 Fecal Occult Blood, POC 04/07/2017 Positive   Final  Office Visit on 03/30/2017  Component Date Value Ref Range Status  . Hemoglobin A1C 03/30/2017 6.6   Final  . Est. average glucose Bld gHb Est-m* 03/30/2017 143   Final  . WBC 03/30/2017 9.1  3.4 - 10.8 x10E3/uL Final  . RBC 03/30/2017 5.22  4.14 - 5.80 x10E6/uL Final  . Hemoglobin 03/30/2017 9.6* 13.0 - 17.7 g/dL Final  . Hematocrit 03/30/2017 33.5* 37.5 - 51.0 % Final  . MCV 03/30/2017 64* 79 - 97 fL Final  . MCH 03/30/2017 18.4* 26.6 - 33.0 pg Final  . MCHC 03/30/2017 28.7* 31.5 - 35.7 g/dL Final  . RDW 03/30/2017 19.1* 12.3 - 15.4 % Final  . Platelets 03/30/2017 415* 150 - 379 x10E3/uL Final  . Neutrophils 03/30/2017 60  Not Estab. % Final  . Lymphs 03/30/2017 20  Not Estab. % Final  . Monocytes 03/30/2017 6  Not Estab. % Final  . Eos 03/30/2017 13  Not Estab. % Final  . Basos 03/30/2017 1  Not Estab. % Final  . Neutrophils Absolute 03/30/2017 5.4  1.4 - 7.0 x10E3/uL Final  . Lymphocytes Absolute 03/30/2017 1.9  0.7 - 3.1 x10E3/uL Final  . Monocytes Absolute 03/30/2017 0.6  0.1 - 0.9 x10E3/uL Final  .  EOS (ABSOLUTE) 03/30/2017 1.2* 0.0 - 0.4 x10E3/uL Final  . Basophils Absolute 03/30/2017 0.1  0.0 - 0.2 x10E3/uL Final  . Immature Granulocytes 03/30/2017 0  Not Estab. % Final  . Immature Grans (Abs) 03/30/2017 0.0  0.0 - 0.1 x10E3/uL Final  . Glucose 03/30/2017 152* 65 - 99 mg/dL Final  . BUN 03/30/2017 12  6 - 24 mg/dL Final  . Creatinine, Ser  03/30/2017 0.72* 0.76 - 1.27 mg/dL Final  . GFR calc non Af Amer 03/30/2017 104  >59 mL/min/1.73 Final  . GFR calc Af Amer 03/30/2017 120  >59 mL/min/1.73 Final  . BUN/Creatinine Ratio 03/30/2017 17  9 - 20 Final  . Sodium 03/30/2017 139  134 - 144 mmol/L Final  . Potassium 03/30/2017 6.0* 3.5 - 5.2 mmol/L Final  . Chloride 03/30/2017 99  96 - 106 mmol/L Final  . CO2 03/30/2017 23  20 - 29 mmol/L Final  . Calcium 03/30/2017 10.0  8.7 - 10.2 mg/dL Final  . Total Protein 03/30/2017 7.5  6.0 - 8.5 g/dL Final  . Albumin 03/30/2017 4.8  3.5 - 5.5 g/dL Final  . Globulin, Total 03/30/2017 2.7  1.5 - 4.5 g/dL Final  . Albumin/Globulin Ratio 03/30/2017 1.8  1.2 - 2.2 Final  . Bilirubin Total 03/30/2017 0.3  0.0 - 1.2 mg/dL Final  . Alkaline Phosphatase 03/30/2017 87  39 - 117 IU/L Final  . AST 03/30/2017 13  0 - 40 IU/L Final  . ALT 03/30/2017 12  0 - 44 IU/L Final  . Cholesterol, Total 03/30/2017 131  100 - 199 mg/dL Final  . Triglycerides 03/30/2017 187* 0 - 149 mg/dL Final  . HDL 03/30/2017 39* >39 mg/dL Final  . VLDL Cholesterol Cal 03/30/2017 37  5 - 40 mg/dL Final  . LDL Calculated 03/30/2017 55  0 - 99 mg/dL Final  . Chol/HDL Ratio 03/30/2017 3.4  0.0 - 5.0 ratio Final   Comment:                                   T. Chol/HDL Ratio                                             Men  Women                               1/2 Avg.Risk  3.4    3.3                                   Avg.Risk  5.0    4.4                                2X Avg.Risk  9.6    7.1                                3X Avg.Risk 23.4   11.0   . Prostate Specific Ag, Serum 03/30/2017 0.5  0.0 - 4.0 ng/mL Final   Comment: Roche ECLIA methodology. According to the American Urological Association, Serum PSA should decrease  and remain at undetectable levels after radical prostatectomy. The AUA defines biochemical recurrence as an initial PSA value 0.2 ng/mL or greater followed by a subsequent confirmatory PSA value 0.2 ng/mL or  greater. Values obtained with different assay methods or kits cannot be used interchangeably. Results cannot be interpreted as absolute evidence of the presence or absence of malignant disease.   . Total Iron Binding Capacity 03/30/2017 424  250 - 450 ug/dL Final  . UIBC 03/30/2017 402* 111 - 343 ug/dL Final  . Iron 03/30/2017 22* 38 - 169 ug/dL Final  . Iron Saturation 03/30/2017 5* 15 - 55 % Final  . Ferritin 03/30/2017 9* 30 - 400 ng/mL Final  . specimen status report 03/30/2017 Comment   Final   Comment: Written Authorization Written Authorization Written Authorization Received. Authorization received from [facility] 04-07-2017 Logged by Clemens Catholic   Results for DELL, BRINER (MRN [id number]) as of 04/15/2017 15:09  Ref. Range 03/30/2017 09:12  Iron Latest Ref Range: 38 - 169 ug/dL 22 (L)  UIBC Latest Ref Range: 111 - 343 ug/dL 402 (H)  TIBC Latest Ref Range: 250 - 450 ug/dL 424  Ferritin Latest Ref Range: 30 - 400 ng/mL 9 (L)  Iron Saturation Latest Ref Range: 15 - 55 % 5 (LL)    RADIOGRAPHIC STUDIES: I have personally reviewed the radiological images as listed and agree with the findings in the report No recent images.   ASSESSMENT/PLAN 1. Iron deficiency anemia due to chronic blood loss   2. Anemia due to GI blood loss   3. Gastroesophageal reflux disease, esophagitis presence not specified   4. Other fatigue   5. Thrombocytosis ([place])   6. Hyperkalemia   7. Former smoker   . # Discussed with patient that he has iron deficiency anemia due to chronic blood loss from GI. As he is very symptomatic, with very low iron stores, oral supplementation likely will not replete his iron store quick enough to improve fatigue. Plan IV iron with Feraheme 510mg  weekly x 2 doses. Severe allergic infusion reactions discussed with patient and he agrees with the plan and willing to proceed.   # Etiology of chronic GI blood loss unknown, definitely need GI work up. I suggest EGD  with colonoscopy giving his chronic GERD symptoms.   # I anticipate his fatigue will improve after iron store repletion.   # Thombocytosis is likely reactive due to iron deficiency. Hold additional work up.   # Reviewed his recent labs. He seems to have chronic hyperkalemia with normal renal function. I advise to avoid high potassium containing food and also call primary care regarding adjusting his Invokana dose or switch to other medication. Patient voices understanding and will call his PCP.   # Former smoker, he is eligible for lung cancer screening. Today he appears quite frustrated about the delayed scheduling for GI work up. I will revisit this problem at next visit.   Follow in 3 weeks to recheck labs.   Orders Placed This Encounter  Procedures  . CBC with Differential/Platelet    Standing Status:   Future    Standing Expiration Date:   04/15/2018  . Comprehensive metabolic panel    Standing Status:   Future    Standing Expiration Date:   04/15/2018    All questions were answered. The patient knows to call the clinic with any problems, questions or concerns. Thank you for this kind referral and the opportunity to participate in the care of this patient.  A copy of today's note is routed to referring physician Fenton Malling.   Earlie Server, MD  04/15/2017 3:03 PM

## 2017-04-15 NOTE — Progress Notes (Signed)
Patient here today for initial evaluation regarding anemia. Patient reports dizziness and fatigue today, referred by GI.

## 2017-04-15 NOTE — Telephone Encounter (Signed)
Please review thanks,  -

## 2017-04-15 NOTE — Telephone Encounter (Signed)
Pt was seen by Dr Tasia Catchings at the cancer center and per pt his potassium level is high.Dr Tasia Catchings wants to know if you can prescribe something other than the Invokana that he is on

## 2017-04-16 MED ORDER — DAPAGLIFLOZIN PROPANEDIOL 10 MG PO TABS: 10.0000 mg | ORAL_TABLET | Freq: Every day | ORAL | 1 refills | Status: DC

## 2017-04-16 NOTE — Telephone Encounter (Signed)
Wilder Glade sent to [facility] in [place]. Would recommend rechecking A1c and potassium in one month to make sure potassuim is coming down and A1c does not get affected by change in medications.

## 2017-04-16 NOTE — Telephone Encounter (Signed)
LMTCB  Thanks,  - 

## 2017-04-16 NOTE — Telephone Encounter (Signed)
Patient was advised as directed below. Pt is willing to try the [place]. He wants medication to be send to [facility] in [place].  Thanks,  -

## 2017-04-16 NOTE — Telephone Encounter (Signed)
We can change to farxiga if patient is agreeable. Medication is in same class as Invokana. It has not been shown to cause hyperkalemia.

## 2017-04-17 ENCOUNTER — Inpatient Hospital Stay: Payer: BLUE CROSS/BLUE SHIELD

## 2017-04-17 VITALS — BP 114/68 | HR 68 | Resp 20

## 2017-04-17 DIAGNOSIS — Z79899 Other long term (current) drug therapy: Secondary | ICD-10-CM | POA: Diagnosis not present

## 2017-04-17 DIAGNOSIS — Z9989 Dependence on other enabling machines and devices: Secondary | ICD-10-CM | POA: Diagnosis not present

## 2017-04-17 DIAGNOSIS — D473 Essential (hemorrhagic) thrombocythemia: Secondary | ICD-10-CM | POA: Diagnosis not present

## 2017-04-17 DIAGNOSIS — I10 Essential (primary) hypertension: Secondary | ICD-10-CM | POA: Diagnosis not present

## 2017-04-17 DIAGNOSIS — K5790 Diverticulosis of intestine, part unspecified, without perforation or abscess without bleeding: Secondary | ICD-10-CM | POA: Diagnosis not present

## 2017-04-17 DIAGNOSIS — D500 Iron deficiency anemia secondary to blood loss (chronic): Secondary | ICD-10-CM | POA: Diagnosis not present

## 2017-04-17 DIAGNOSIS — E875 Hyperkalemia: Secondary | ICD-10-CM | POA: Diagnosis not present

## 2017-04-17 DIAGNOSIS — Z7984 Long term (current) use of oral hypoglycemic drugs: Secondary | ICD-10-CM | POA: Diagnosis not present

## 2017-04-17 DIAGNOSIS — E119 Type 2 diabetes mellitus without complications: Secondary | ICD-10-CM | POA: Diagnosis not present

## 2017-04-17 DIAGNOSIS — G4733 Obstructive sleep apnea (adult) (pediatric): Secondary | ICD-10-CM | POA: Diagnosis not present

## 2017-04-17 DIAGNOSIS — R51 Headache: Secondary | ICD-10-CM | POA: Diagnosis not present

## 2017-04-17 DIAGNOSIS — E785 Hyperlipidemia, unspecified: Secondary | ICD-10-CM | POA: Diagnosis not present

## 2017-04-17 DIAGNOSIS — I2510 Atherosclerotic heart disease of native coronary artery without angina pectoris: Secondary | ICD-10-CM | POA: Diagnosis not present

## 2017-04-17 DIAGNOSIS — K922 Gastrointestinal hemorrhage, unspecified: Secondary | ICD-10-CM | POA: Diagnosis not present

## 2017-04-17 DIAGNOSIS — R5383 Other fatigue: Secondary | ICD-10-CM | POA: Diagnosis not present

## 2017-04-17 DIAGNOSIS — K219 Gastro-esophageal reflux disease without esophagitis: Secondary | ICD-10-CM | POA: Diagnosis not present

## 2017-04-17 MED ORDER — SODIUM CHLORIDE 0.9 % IV SOLN
Freq: Once | INTRAVENOUS | Status: AC
  Administered 2017-04-17: 14:00:00 via INTRAVENOUS
  Filled 2017-04-17: qty 1000

## 2017-04-17 MED ORDER — SODIUM CHLORIDE 0.9 % IV SOLN
510.0000 mg | Freq: Once | INTRAVENOUS | Status: AC
  Administered 2017-04-17: 510 mg via INTRAVENOUS
  Filled 2017-04-17: qty 17

## 2017-04-17 NOTE — Telephone Encounter (Signed)
Patient advised as below. Patient verbalizes understanding and is in agreement with treatment plan.  

## 2017-04-24 ENCOUNTER — Inpatient Hospital Stay: Payer: BLUE CROSS/BLUE SHIELD

## 2017-04-24 VITALS — BP 127/73 | HR 75 | Temp 97.9°F | Resp 20

## 2017-04-24 DIAGNOSIS — Z7984 Long term (current) use of oral hypoglycemic drugs: Secondary | ICD-10-CM | POA: Diagnosis not present

## 2017-04-24 DIAGNOSIS — D500 Iron deficiency anemia secondary to blood loss (chronic): Secondary | ICD-10-CM

## 2017-04-24 DIAGNOSIS — R5383 Other fatigue: Secondary | ICD-10-CM | POA: Diagnosis not present

## 2017-04-24 DIAGNOSIS — K5790 Diverticulosis of intestine, part unspecified, without perforation or abscess without bleeding: Secondary | ICD-10-CM | POA: Diagnosis not present

## 2017-04-24 DIAGNOSIS — K922 Gastrointestinal hemorrhage, unspecified: Secondary | ICD-10-CM | POA: Diagnosis not present

## 2017-04-24 DIAGNOSIS — I10 Essential (primary) hypertension: Secondary | ICD-10-CM | POA: Diagnosis not present

## 2017-04-24 DIAGNOSIS — G4733 Obstructive sleep apnea (adult) (pediatric): Secondary | ICD-10-CM | POA: Diagnosis not present

## 2017-04-24 DIAGNOSIS — Z79899 Other long term (current) drug therapy: Secondary | ICD-10-CM | POA: Diagnosis not present

## 2017-04-24 DIAGNOSIS — K219 Gastro-esophageal reflux disease without esophagitis: Secondary | ICD-10-CM | POA: Diagnosis not present

## 2017-04-24 DIAGNOSIS — Z9989 Dependence on other enabling machines and devices: Secondary | ICD-10-CM | POA: Diagnosis not present

## 2017-04-24 DIAGNOSIS — R51 Headache: Secondary | ICD-10-CM | POA: Diagnosis not present

## 2017-04-24 DIAGNOSIS — E785 Hyperlipidemia, unspecified: Secondary | ICD-10-CM | POA: Diagnosis not present

## 2017-04-24 DIAGNOSIS — E875 Hyperkalemia: Secondary | ICD-10-CM | POA: Diagnosis not present

## 2017-04-24 DIAGNOSIS — I2510 Atherosclerotic heart disease of native coronary artery without angina pectoris: Secondary | ICD-10-CM | POA: Diagnosis not present

## 2017-04-24 DIAGNOSIS — D473 Essential (hemorrhagic) thrombocythemia: Secondary | ICD-10-CM | POA: Diagnosis not present

## 2017-04-24 DIAGNOSIS — E119 Type 2 diabetes mellitus without complications: Secondary | ICD-10-CM | POA: Diagnosis not present

## 2017-04-24 MED ORDER — FERUMOXYTOL INJECTION 510 MG/17 ML
510.0000 mg | Freq: Once | INTRAVENOUS | Status: AC
  Administered 2017-04-24: 510 mg via INTRAVENOUS
  Filled 2017-04-24: qty 17

## 2017-04-24 MED ORDER — SODIUM CHLORIDE 0.9 % IV SOLN
Freq: Once | INTRAVENOUS | Status: AC
Start: 2017-04-24 — End: 2017-04-24
  Administered 2017-04-24: 15:00:00 via INTRAVENOUS
  Filled 2017-04-24: qty 1000

## 2017-04-29 ENCOUNTER — Telehealth: Payer: Self-pay | Admitting: *Deleted

## 2017-04-29 ENCOUNTER — Other Ambulatory Visit: Payer: Self-pay | Admitting: *Deleted

## 2017-04-29 ENCOUNTER — Inpatient Hospital Stay: Payer: BLUE CROSS/BLUE SHIELD

## 2017-04-29 DIAGNOSIS — R51 Headache: Secondary | ICD-10-CM | POA: Diagnosis not present

## 2017-04-29 DIAGNOSIS — R5383 Other fatigue: Secondary | ICD-10-CM | POA: Diagnosis not present

## 2017-04-29 DIAGNOSIS — D473 Essential (hemorrhagic) thrombocythemia: Secondary | ICD-10-CM | POA: Diagnosis not present

## 2017-04-29 DIAGNOSIS — Z9989 Dependence on other enabling machines and devices: Secondary | ICD-10-CM | POA: Diagnosis not present

## 2017-04-29 DIAGNOSIS — I2510 Atherosclerotic heart disease of native coronary artery without angina pectoris: Secondary | ICD-10-CM | POA: Diagnosis not present

## 2017-04-29 DIAGNOSIS — E875 Hyperkalemia: Secondary | ICD-10-CM | POA: Diagnosis not present

## 2017-04-29 DIAGNOSIS — E119 Type 2 diabetes mellitus without complications: Secondary | ICD-10-CM | POA: Diagnosis not present

## 2017-04-29 DIAGNOSIS — D500 Iron deficiency anemia secondary to blood loss (chronic): Secondary | ICD-10-CM

## 2017-04-29 DIAGNOSIS — E785 Hyperlipidemia, unspecified: Secondary | ICD-10-CM | POA: Diagnosis not present

## 2017-04-29 DIAGNOSIS — K5790 Diverticulosis of intestine, part unspecified, without perforation or abscess without bleeding: Secondary | ICD-10-CM | POA: Diagnosis not present

## 2017-04-29 DIAGNOSIS — Z79899 Other long term (current) drug therapy: Secondary | ICD-10-CM | POA: Diagnosis not present

## 2017-04-29 DIAGNOSIS — I10 Essential (primary) hypertension: Secondary | ICD-10-CM | POA: Diagnosis not present

## 2017-04-29 DIAGNOSIS — Z7984 Long term (current) use of oral hypoglycemic drugs: Secondary | ICD-10-CM | POA: Diagnosis not present

## 2017-04-29 DIAGNOSIS — K219 Gastro-esophageal reflux disease without esophagitis: Secondary | ICD-10-CM | POA: Diagnosis not present

## 2017-04-29 DIAGNOSIS — K922 Gastrointestinal hemorrhage, unspecified: Secondary | ICD-10-CM | POA: Diagnosis not present

## 2017-04-29 DIAGNOSIS — G4733 Obstructive sleep apnea (adult) (pediatric): Secondary | ICD-10-CM | POA: Diagnosis not present

## 2017-04-29 LAB — CBC WITH DIFFERENTIAL/PLATELET
Basophils Absolute: 0.1 10*3/uL (ref 0–0.1)
Basophils Relative: 2 %
Eosinophils Absolute: 0.3 10*3/uL (ref 0–0.7)
Eosinophils Relative: 5 %
HCT: 34.6 % — ABNORMAL LOW (ref 40.0–52.0)
HEMOGLOBIN: 10.6 g/dL — AB (ref 13.0–18.0)
LYMPHS ABS: 1.4 10*3/uL (ref 1.0–3.6)
LYMPHS PCT: 21 %
MCH: 21.4 pg — AB (ref 26.0–34.0)
MCHC: 30.7 g/dL — AB (ref 32.0–36.0)
MCV: 69.8 fL — AB (ref 80.0–100.0)
MONOS PCT: 6 %
Monocytes Absolute: 0.4 10*3/uL (ref 0.2–1.0)
NEUTROS PCT: 66 %
Neutro Abs: 4.5 10*3/uL (ref 1.4–6.5)
Platelets: 311 10*3/uL (ref 150–440)
RBC: 4.95 MIL/uL (ref 4.40–5.90)
RDW: 27.4 % — ABNORMAL HIGH (ref 11.5–14.5)
WBC: 6.8 10*3/uL (ref 3.8–10.6)

## 2017-04-29 LAB — FERRITIN: FERRITIN: 169 ng/mL (ref 24–336)

## 2017-04-29 LAB — IRON AND TIBC
Iron: 76 ug/dL (ref 45–182)
Saturation Ratios: 25 % (ref 17.9–39.5)
TIBC: 305 ug/dL (ref 250–450)
UIBC: 229 ug/dL

## 2017-04-29 NOTE — Telephone Encounter (Signed)
Patient called and reports that since the iron infusions his stools have gotten darker and blackish in color. He called Dr Dorothey Baseman office who told him to call us. He also reports that he is having abdominal cramping. Please advise

## 2017-04-29 NOTE — Telephone Encounter (Signed)
Please call and schedule pt come in for lab today.  I will order the labs (cbc,iron, ferr)

## 2017-04-29 NOTE — Telephone Encounter (Signed)
Patient returned call and has agreed to come in for lab at 1130 today

## 2017-04-29 NOTE — Telephone Encounter (Signed)
I attempted to return call to patient, but had to leave VM to call me back for appointment

## 2017-05-06 ENCOUNTER — Inpatient Hospital Stay (HOSPITAL_BASED_OUTPATIENT_CLINIC_OR_DEPARTMENT_OTHER): Payer: BLUE CROSS/BLUE SHIELD | Admitting: Oncology

## 2017-05-06 ENCOUNTER — Encounter: Payer: Self-pay | Admitting: Oncology

## 2017-05-06 ENCOUNTER — Inpatient Hospital Stay: Payer: BLUE CROSS/BLUE SHIELD

## 2017-05-06 VITALS — BP 145/75 | HR 71 | Temp 97.5°F | Resp 16 | Ht 70.0 in | Wt 175.0 lb

## 2017-05-06 DIAGNOSIS — K922 Gastrointestinal hemorrhage, unspecified: Secondary | ICD-10-CM

## 2017-05-06 DIAGNOSIS — D473 Essential (hemorrhagic) thrombocythemia: Secondary | ICD-10-CM | POA: Diagnosis not present

## 2017-05-06 DIAGNOSIS — K219 Gastro-esophageal reflux disease without esophagitis: Secondary | ICD-10-CM

## 2017-05-06 DIAGNOSIS — E119 Type 2 diabetes mellitus without complications: Secondary | ICD-10-CM

## 2017-05-06 DIAGNOSIS — I2510 Atherosclerotic heart disease of native coronary artery without angina pectoris: Secondary | ICD-10-CM | POA: Diagnosis not present

## 2017-05-06 DIAGNOSIS — E875 Hyperkalemia: Secondary | ICD-10-CM

## 2017-05-06 DIAGNOSIS — D500 Iron deficiency anemia secondary to blood loss (chronic): Secondary | ICD-10-CM

## 2017-05-06 DIAGNOSIS — I10 Essential (primary) hypertension: Secondary | ICD-10-CM

## 2017-05-06 DIAGNOSIS — E785 Hyperlipidemia, unspecified: Secondary | ICD-10-CM

## 2017-05-06 DIAGNOSIS — Z86010 Personal history of colonic polyps: Secondary | ICD-10-CM

## 2017-05-06 DIAGNOSIS — Z9989 Dependence on other enabling machines and devices: Secondary | ICD-10-CM

## 2017-05-06 DIAGNOSIS — K5790 Diverticulosis of intestine, part unspecified, without perforation or abscess without bleeding: Secondary | ICD-10-CM

## 2017-05-06 DIAGNOSIS — Z7984 Long term (current) use of oral hypoglycemic drugs: Secondary | ICD-10-CM | POA: Diagnosis not present

## 2017-05-06 DIAGNOSIS — G4733 Obstructive sleep apnea (adult) (pediatric): Secondary | ICD-10-CM | POA: Diagnosis not present

## 2017-05-06 DIAGNOSIS — Z79899 Other long term (current) drug therapy: Secondary | ICD-10-CM | POA: Diagnosis not present

## 2017-05-06 DIAGNOSIS — Z8042 Family history of malignant neoplasm of prostate: Secondary | ICD-10-CM

## 2017-05-06 DIAGNOSIS — Z87442 Personal history of urinary calculi: Secondary | ICD-10-CM

## 2017-05-06 DIAGNOSIS — R5383 Other fatigue: Secondary | ICD-10-CM | POA: Diagnosis not present

## 2017-05-06 DIAGNOSIS — Z880 Allergy status to penicillin: Secondary | ICD-10-CM

## 2017-05-06 DIAGNOSIS — Z951 Presence of aortocoronary bypass graft: Secondary | ICD-10-CM

## 2017-05-06 DIAGNOSIS — Z87891 Personal history of nicotine dependence: Secondary | ICD-10-CM

## 2017-05-06 DIAGNOSIS — R51 Headache: Secondary | ICD-10-CM

## 2017-05-06 DIAGNOSIS — Z801 Family history of malignant neoplasm of trachea, bronchus and lung: Secondary | ICD-10-CM

## 2017-05-06 DIAGNOSIS — D75839 Thrombocytosis, unspecified: Secondary | ICD-10-CM

## 2017-05-06 LAB — CBC WITH DIFFERENTIAL/PLATELET
BASOS PCT: 0 %
Basophils Absolute: 0 10*3/uL (ref 0–0.1)
EOS ABS: 0.5 10*3/uL (ref 0–0.7)
Eosinophils Relative: 6 %
HCT: 37.5 % — ABNORMAL LOW (ref 40.0–52.0)
Hemoglobin: 12 g/dL — ABNORMAL LOW (ref 13.0–18.0)
Lymphocytes Relative: 22 %
Lymphs Abs: 1.6 10*3/uL (ref 1.0–3.6)
MCH: 23.1 pg — ABNORMAL LOW (ref 26.0–34.0)
MCHC: 32 g/dL (ref 32.0–36.0)
MCV: 72.3 fL — ABNORMAL LOW (ref 80.0–100.0)
MONO ABS: 0.4 10*3/uL (ref 0.2–1.0)
MONOS PCT: 6 %
NEUTROS PCT: 66 %
Neutro Abs: 4.8 10*3/uL (ref 1.4–6.5)
Platelets: 332 10*3/uL (ref 150–440)
RBC: 5.19 MIL/uL (ref 4.40–5.90)
RDW: 34.2 % — AB (ref 11.5–14.5)
WBC: 7.3 10*3/uL (ref 3.8–10.6)

## 2017-05-06 LAB — COMPREHENSIVE METABOLIC PANEL
ALBUMIN: 4.4 g/dL (ref 3.5–5.0)
ALT: 36 U/L (ref 17–63)
ANION GAP: 11 (ref 5–15)
AST: 28 U/L (ref 15–41)
Alkaline Phosphatase: 65 U/L (ref 38–126)
BUN: 11 mg/dL (ref 6–20)
CO2: 24 mmol/L (ref 22–32)
Calcium: 9.4 mg/dL (ref 8.9–10.3)
Chloride: 101 mmol/L (ref 101–111)
Creatinine, Ser: 0.72 mg/dL (ref 0.61–1.24)
GFR calc non Af Amer: >60 mL/min (ref >60)
GLUCOSE: 111 mg/dL — AB (ref 65–99)
POTASSIUM: 4.3 mmol/L (ref 3.5–5.1)
SODIUM: 136 mmol/L (ref 135–145)
Total Bilirubin: 0.5 mg/dL (ref 0.3–1.2)
Total Protein: 7.3 g/dL (ref 6.5–8.1)

## 2017-05-06 NOTE — Progress Notes (Signed)
Joseph Terry:  Patient Care Team: Rubye Beach as PCP - General (Family Medicine) Terance Ice, MD as Consulting Physician (Cardiology) [name], MD as Consulting Physician (Family Medicine) Christene Lye, MD (General Surgery) Cristy Friedlander, MD (Oral Surgery)  CHIEF COMPLAINTS/PURPOSE OF CONSULTATION: I have fatigue, low blood count.   HISTORY OF PRESENTING ILLNESS: Joseph Terry 56 y.o. male with PMH listed as below is referred by primary care [facility] to me for evaluation and management of iron deficiency anemia.  Patient reports feeling fatigued lately and he was seen by primary care March 30, 2017. His lab tests indicated anemia, thrombocytosis and iron deficiency.  He also had positive occult stool test. Per Epic records, he has had colonoscopy in 2009 and later 2015, with findings of multiple polyps and diverticulosis.  He has chronic GERD and takes PPI. He craves for salty food lately.  Denies weight loss, fever or chills, chest pain, SOB or abdominal pain. Denies having bright red blood in stool or black stool.  Primary is working on referring him to see GI or Dr.Sankar to get further work up.   INTERVAL HISTORY Patient presents to discuss about the results.  During the interval he has received IV iron infusion with Feraheme. He call to Dr. Allen Norris and complaining that he has  Darker stool. He was instructed by GI to call us. Iron test and CBC was obtained. Which showed improvement of his iron depletion and blood count. Today he feels much better. His energy level has significantly improved after the second dose of Feraheme.    Review of Systems  Constitutional: Positive for fatigue.  HENT:  Negative.   Eyes: Negative.   Respiratory: Negative.   Cardiovascular: Negative.   Gastrointestinal: Negative.   Endocrine: Negative.   Genitourinary: Negative.    Musculoskeletal: Negative.    Skin: Negative.   Neurological: Positive for headaches.  Hematological: Negative.   Psychiatric/Behavioral: Negative.     MEDICAL HISTORY: Past Medical History:  Diagnosis Date  . Anxiety state, unspecified   . CAD (coronary artery disease), severe multivessel CAD surgical consult for CABG    a. 12/2011 Cath: 3 vessel CAD with normal EF;  b. 12/2011 CABG x 5: LIMA to LAD, SVG to distal RCA with sequential SVG to PDA, SVG to OM, and SVG to D2;  c. 03/2014 ETT: Ex time 6:21, 55mm horizontal ST dep in V3-V6, HTN response.  . Depression   . Diaphragmatic hernia without mention of obstruction or gangrene   . Diverticulosis   . GERD (gastroesophageal reflux disease)   . Hyperlipidemia   . Hypertension   . Impotence of organic origin   . Internal hemorrhoids without mention of complication   . Iron deficiency anemia due to chronic blood loss 04/15/2017  . Kidney stone   . OSA on CPAP    a. Sleep study 07/2013 +sever OSA; CPAP at 10cm recommended.  . Personal history of colonic polyps   . Type II diabetes mellitus ([place])     SURGICAL HISTORY: Past Surgical History:  Procedure Laterality Date  . CARDIAC CATHETERIZATION  4 /19/ 2013   [place]  11/03/13  . COLONOSCOPY  11/01/13   multiple polyps; diverticulosis. Sankar. Repeat 5 years.  . COLONOSCOPY W/ POLYPECTOMY  09/16/2007   colon polyps.  Repeat 5 years. Iftikhar.  . CORONARY ARTERY BYPASS GRAFT  01/05/2012   Procedure: CORONARY ARTERY BYPASS GRAFTING (CABG);  Surgeon: Rexene Alberts, MD;  Location: [place];  Service: Open Heart Surgery;  Laterality: N/A;  coronary artery bypass graft times five using left internal mammary artery and right leg greater saphenous vein harvested endoscopically   . LEFT HEART CATHETERIZATION WITH CORONARY ANGIOGRAM N/A 01/02/2012   Procedure: LEFT HEART CATHETERIZATION WITH CORONARY ANGIOGRAM;  Surgeon: Troy Sine, MD;  Location: [facility] C[facility] LAB;  Service: Cardiovascular;  Laterality:  N/A;  . POLYSOMNOGRAPHY  07/2013   +severe OSA; CPAP at 10cm pressure recommended.  . rotator cuff curgery  2011   lt rotator cuff  . Shoulder surgery  1997   bilateral  . WISDOM TOOTH EXTRACTION      SOCIAL HISTORY: Social History   Social History  . Marital status: Divorced    Spouse name: N/A  . Number of children: 1  . Years of education: 77   Occupational History  . [employer] attorney    x 30 yrs   Social History Main Topics  . Smoking status: Former Smoker    Packs/day: 1.50    Years: 35.00    Types: Cigarettes    Quit date: 11/14/2011  . Smokeless tobacco: Never Used     Comment: quit 2 weeks prior to CABG 12/2011  . Alcohol use 0.6 oz/week    1 Cans of beer per week     Comment: Drinks once per month; beer.  . Drug use: No  . Sexual activity: Yes    Partners: Female    Birth control/ protection: Condom   Other Topics Concern  . Not on file   Social History Narrative   Marital status: divorced since 1996; dating casually in 2016.        Lives: Lives alone.          Children:  One son and a granddaughter 80 years old live in [place].       Tobacco: quit with CABG      Alcohol: weekends; 6-8 beers on average.  Beers with football; ten on football weekends.      Exercise: Light, yard work.        Employment:  Works 45-50 hrs. Some weeks.  [employer].  Desk work since 05/2013. Since 1979.      Guns:  Guns in the home not stored in locked cabinet. Smoke alarm in home, Always wears seatbelts.       Sexual History: active;Last HIV testing 2015; has has 25+ partners; females only.             FAMILY HISTORY Family History  Problem Relation Age of Onset  . Heart attack Mother        defribillator in place  . Hypertension Mother   . Hyperlipidemia Mother   . Heart disease Mother        CHF with defibrillator;  CAD.  . Diabetes Mother   . Cancer Mother 24       Lung cancer  . Hypertension Father   . Hyperlipidemia Father   . Cancer Father  71        prostate cancer s/p  implants  . COPD Father   . Hypertension Sister   . Heart disease Maternal Grandmother   . Heart disease Maternal Grandfather   . Cancer Paternal Grandmother   . Cancer Paternal Grandfather   . Hypertension Sister     ALLERGIES:  is allergic to penicillins and tramadol.  MEDICATIONS:  Current Outpatient Prescriptions  Medication Sig Dispense Refill  .  atorvastatin (LIPITOR) 40 MG tablet TAKE 1 TABLET (40 MG TOTAL) BY MOUTH DAILY AT 6 PM 90 tablet 1  . clonazePAM (KLONOPIN) 1 MG tablet Take 1 tablet (1 mg total) by mouth 2 (two) times daily. 180 tablet 1  . dapagliflozin propanediol (FARXIGA) 10 MG TABS tablet Take 10 mg by mouth daily. 90 tablet 1  . glipiZIDE (GLUCOTROL XL) 10 MG 24 hr tablet Take 1 tablet (10 mg total) by mouth daily with breakfast. 90 tablet 1  . metFORMIN (GLUCOPHAGE) 1000 MG tablet TAKE 1 TABLET (1,000 MG TOTAL) BY MOUTH 2 (TWO) TIMES DAILY WITH A MEAL. 180 tablet 1  . metoprolol tartrate (LOPRESSOR) 25 MG tablet TAKE 1 TABLET (25 MG TOTAL) BY MOUTH 2 (TWO) TIMES DAILY 180 tablet 1  . sitaGLIPtin (JANUVIA) 100 MG tablet TAKE 1 TABLET (100 MG TOTAL) BY MOUTH DAILY. 90 tablet 1  . venlafaxine XR (EFFEXOR-XR) 150 MG 24 hr capsule Take 1 capsule (150 mg total) by mouth daily with breakfast. 90 capsule 1   No current facility-administered medications for this Terry.     PHYSICAL EXAMINATION:  ECOG PERFORMANCE STATUS: 1 - Symptomatic but completely ambulatory   There were no vitals filed for this Terry.  There were no vitals filed for this Terry. Physical Exam GENERAL: No distress, well nourished.  SKIN:  No rashes or significant lesions  HEAD: Normocephalic, No masses, lesions, tenderness or abnormalities  EYES: Conjunctiva are pink, non icteric ENT: External ears normal ,lips , buccal mucosa, and tongue normal and mucous membranes are moist  LYMPH: No palpable cervical and axillary lymphadenopathy  LUNGS: Clear to  auscultation, no crackles or wheezes HEART: Regular rate & rhythm, no murmurs, no gallops, S1 normal and S2 normal  ABDOMEN: Abdomen soft, non-tender, normal bowel sounds, I did not appreciate any  masses or organomegaly  MUSCULOSKELETAL: No CVA tenderness and no tenderness on percussion of the back or rib cage.  EXTREMITIES: No edema, no skin discoloration or tenderness NEURO: Alert & oriented, no focal motor/sensory deficits.   LABORATORY DATA: I have personally reviewed the data as listed:  Appointment on 04/29/2017  Component Date Value Ref Range Status  . WBC 04/29/2017 6.8  3.8 - 10.6 K/uL Final  . RBC 04/29/2017 4.95  4.40 - 5.90 MIL/uL Final  . Hemoglobin 04/29/2017 10.6* 13.0 - 18.0 g/dL Final  . HCT 04/29/2017 34.6* 40.0 - 52.0 % Final  . MCV 04/29/2017 69.8* 80.0 - 100.0 fL Final  . MCH 04/29/2017 21.4* 26.0 - 34.0 pg Final  . MCHC 04/29/2017 30.7* 32.0 - 36.0 g/dL Final  . RDW 04/29/2017 27.4* 11.5 - 14.5 % Final  . Platelets 04/29/2017 311  150 - 440 K/uL Final  . Neutrophils Relative % 04/29/2017 66  % Final  . Neutro Abs 04/29/2017 4.5  1.4 - 6.5 K/uL Final  . Lymphocytes Relative 04/29/2017 21  % Final  . Lymphs Abs 04/29/2017 1.4  1.0 - 3.6 K/uL Final  . Monocytes Relative 04/29/2017 6  % Final  . Monocytes Absolute 04/29/2017 0.4  0.2 - 1.0 K/uL Final  . Eosinophils Relative 04/29/2017 5  % Final  . Eosinophils Absolute 04/29/2017 0.3  0 - 0.7 K/uL Final  . Basophils Relative 04/29/2017 2  % Final  . Basophils Absolute 04/29/2017 0.1  0 - 0.1 K/uL Final  . Iron 04/29/2017 76  45 - 182 ug/dL Final  . TIBC 04/29/2017 305  250 - 450 ug/dL Final  . Saturation Ratios 04/29/2017 25  17.9 -  39.5 % Final  . UIBC 04/29/2017 229  ug/dL Final  . Ferritin 04/29/2017 169  24 - 336 ng/mL Final  Orders Only on 04/07/2017  Component Date Value Ref Range Status  . Card #1 Date 04/07/2017 04/07/17   Final  . Fecal Occult Blood, POC 04/07/2017 Positive* Negative Final  . Card  #2 Date 04/07/2017 04/07/17   Final  . Card #2 Fecal Occult Blod, POC 04/07/2017 Positive   Final  . Card #3 Date 04/07/2017 03/2417   Final  . Card #3 Fecal Occult Blood, POC 04/07/2017 Positive   Final  Results for RANE, BLITCH (MRN [id number]) as of 04/15/2017 15:09  Ref. Range 03/30/2017 09:12  Iron Latest Ref Range: 38 - 169 ug/dL 22 (L)  UIBC Latest Ref Range: 111 - 343 ug/dL 402 (H)  TIBC Latest Ref Range: 250 - 450 ug/dL 424  Ferritin Latest Ref Range: 30 - 400 ng/mL 9 (L)  Iron Saturation Latest Ref Range: 15 - 55 % 5 (LL)    RADIOGRAPHIC STUDIES: I have personally reviewed the radiological images as listed and agree with the findings in the report No recent images.   ASSESSMENT/PLAN 1. Iron deficiency anemia due to chronic blood loss   2. Anemia due to GI blood loss   3. Other fatigue   4. Thrombocytosis ([place])   . #  Recent CBC and iron test reviewed with patient.  Ferritin improved as well as hemoglobin. Thrombocytosis resolved. These are indicating of repeated iron store with bone marrow regeneration.We will hold additional IV iron infusion for now.  # Etiology of chronic GI blood loss unknown, definitely need GI work up. I suggest EGD with colonoscopy giving his chronic GERD symptoms. Patient tells me that he has an appointment with GI on 05/28/2017.  # Thombocytosis is likely reactive due to iron deficiency. Resolved.   # Former smoker, he is eligible for lung cancer screening.  I will revisit this problem at next Terry.   Follow in 4 weeks to recheck labs. CBC iron TIBC and ferritin  Orders Placed This Encounter  Procedures  . CBC    Standing Status:   Future    Standing Expiration Date:   05/06/2018  . Iron and TIBC    Standing Status:   Future    Standing Expiration Date:   05/06/2018  . Ferritin    Standing Status:   Future    Standing Expiration Date:   05/06/2018   All questions were answered. The patient knows to call the clinic with any problems,  questions or concerns. Thank you for this kind referral and the opportunity to participate in the care of this patient. A copy of today's note is routed to referring physician Fenton Malling.   Earlie Server, MD  05/06/2017 1:20 PM

## 2017-05-06 NOTE — Progress Notes (Signed)
Patient here for follow up. He states he "feels better since his iron infusion".

## 2017-05-08 ENCOUNTER — Other Ambulatory Visit: Payer: Self-pay | Admitting: Physician Assistant

## 2017-05-08 DIAGNOSIS — F418 Other specified anxiety disorders: Secondary | ICD-10-CM

## 2017-05-08 NOTE — Telephone Encounter (Signed)
Called in to [facility] 

## 2017-05-21 ENCOUNTER — Other Ambulatory Visit: Payer: Self-pay | Admitting: Physician Assistant

## 2017-05-21 DIAGNOSIS — I10 Essential (primary) hypertension: Secondary | ICD-10-CM

## 2017-05-25 ENCOUNTER — Ambulatory Visit: Payer: BLUE CROSS/BLUE SHIELD | Admitting: Gastroenterology

## 2017-05-27 DIAGNOSIS — M5033 Other cervical disc degeneration, cervicothoracic region: Secondary | ICD-10-CM | POA: Diagnosis not present

## 2017-05-27 DIAGNOSIS — M5441 Lumbago with sciatica, right side: Secondary | ICD-10-CM | POA: Diagnosis not present

## 2017-05-27 DIAGNOSIS — M9901 Segmental and somatic dysfunction of cervical region: Secondary | ICD-10-CM | POA: Diagnosis not present

## 2017-05-27 DIAGNOSIS — M9903 Segmental and somatic dysfunction of lumbar region: Secondary | ICD-10-CM | POA: Diagnosis not present

## 2017-05-28 ENCOUNTER — Encounter: Payer: Self-pay | Admitting: Gastroenterology

## 2017-05-28 ENCOUNTER — Other Ambulatory Visit: Payer: Self-pay

## 2017-05-28 ENCOUNTER — Ambulatory Visit (INDEPENDENT_AMBULATORY_CARE_PROVIDER_SITE_OTHER): Payer: BLUE CROSS/BLUE SHIELD | Admitting: Gastroenterology

## 2017-05-28 ENCOUNTER — Ambulatory Visit: Payer: BLUE CROSS/BLUE SHIELD | Admitting: Gastroenterology

## 2017-05-28 VITALS — BP 127/69 | HR 62 | Temp 98.3°F | Ht 70.0 in | Wt 174.5 lb

## 2017-05-28 DIAGNOSIS — D500 Iron deficiency anemia secondary to blood loss (chronic): Secondary | ICD-10-CM | POA: Diagnosis not present

## 2017-05-28 NOTE — Progress Notes (Signed)
Gastroenterology Consultation  Referring Provider:     Florian Buff* Primary Care Physician:  Mar Daring, PA-C Primary Gastroenterologist:  Dr. Allen Norris     Reason for Consultation:     Iron deficiency anemia        HPI:   Joseph Terry is a 56 y.o. y/o male referred for consultation & management of Iron deficiency anemia by Dr. Marlyn Corporal, Clearnce Sorrel, PA-C.  This patient comes today after being seen at the [facility] clinic for anemia.  The patient had a hemoglobin down to 9.6 from his baseline in 2016 of 12.9.  The patient was transfused with Diane when he was found to have low iron saturation and ferritin.  The patient's hemoglobin then came up to 12.  The patient had a colonoscopy by me approximately 10 years ago.  The patient states he did not like the way he was treated at the other gastrology office and stated he would not go back there again and wanted to see me.  There is no report of any abdominal pain nausea vomiting fevers chills black stools or bloody stools. The patient's last colonoscopy was reported to be in 2015 by Dr. Jamal Collin. The patient denies any unexplained weight loss.  Past Medical History:  Diagnosis Date  . Anxiety state, unspecified   . CAD (coronary artery disease), severe multivessel CAD surgical consult for CABG    a. 12/2011 Cath: 3 vessel CAD with normal EF;  b. 12/2011 CABG x 5: LIMA to LAD, SVG to distal RCA with sequential SVG to PDA, SVG to OM, and SVG to D2;  c. 03/2014 ETT: Ex time 6:21, 34mm horizontal ST dep in V3-V6, HTN response.  . Depression   . Diaphragmatic hernia without mention of obstruction or gangrene   . Diverticulosis   . GERD (gastroesophageal reflux disease)   . Hyperlipidemia   . Hypertension   . Impotence of organic origin   . Internal hemorrhoids without mention of complication   . Iron deficiency anemia due to chronic blood loss 04/15/2017  . Kidney stone   . OSA on CPAP    a. Sleep study 07/2013 +sever OSA; CPAP at  10cm recommended.  . Personal history of colonic polyps   . Type II diabetes mellitus ([place])     Past Surgical History:  Procedure Laterality Date  . CARDIAC CATHETERIZATION  4 /19/ 2013   [place]  11/03/13  . COLONOSCOPY  11/01/13   multiple polyps; diverticulosis. Sankar. Repeat 5 years.  . COLONOSCOPY W/ POLYPECTOMY  09/16/2007   colon polyps.  Repeat 5 years. Iftikhar.  . CORONARY ARTERY BYPASS GRAFT  01/05/2012   Procedure: CORONARY ARTERY BYPASS GRAFTING (CABG);  Surgeon: Rexene Alberts, Joseph Terry;  Location: [place];  Service: Open Heart Surgery;  Laterality: N/A;  coronary artery bypass graft times five using left internal mammary artery and right leg greater saphenous vein harvested endoscopically   . LEFT HEART CATHETERIZATION WITH CORONARY ANGIOGRAM N/A 01/02/2012   Procedure: LEFT HEART CATHETERIZATION WITH CORONARY ANGIOGRAM;  Surgeon: Troy Sine, Joseph Terry;  Location: [facility] CATH LAB;  Service: Cardiovascular;  Laterality: N/A;  . POLYSOMNOGRAPHY  07/2013   +severe OSA; CPAP at 10cm pressure recommended.  . rotator cuff curgery  2011   lt rotator cuff  . Shoulder surgery  1997   bilateral  . WISDOM TOOTH EXTRACTION      Prior to Admission medications   Medication Sig Start Date End Date  Taking? Authorizing Provider  atorvastatin (LIPITOR) 40 MG tablet TAKE 1 TABLET (40 MG TOTAL) BY MOUTH DAILY AT 6 PM 03/30/17  Yes Burnette, Jennifer M, PA-C  clonazePAM (KLONOPIN) 1 MG tablet TAKE 1 TABLET BY MOUTH TWICE DAILY AS NEEDED 05/08/17  Yes Fenton Malling M, PA-C  dapagliflozin propanediol (FARXIGA) 10 MG TABS tablet Take 10 mg by mouth daily. 04/16/17  Yes Fenton Malling M, PA-C  glipiZIDE (GLUCOTROL XL) 10 MG 24 hr tablet Take 1 tablet (10 mg total) by mouth daily with breakfast. 12/26/16  Yes Burnette, Clearnce Sorrel, PA-C  metFORMIN (GLUCOPHAGE) 1000 MG tablet TAKE 1 TABLET (1,000 MG TOTAL) BY MOUTH 2 (TWO) TIMES DAILY WITH A MEAL. 12/26/16  Yes Mar Daring,  PA-C  metoprolol tartrate (LOPRESSOR) 25 MG tablet TAKE 1 TABLET(25 MG) BY MOUTH TWICE DAILY 05/21/17  Yes Mar Daring, PA-C  pantoprazole (PROTONIX) 40 MG tablet  05/07/17  Yes Provider, Historical, Joseph Terry  sitaGLIPtin (JANUVIA) 100 MG tablet TAKE 1 TABLET (100 MG TOTAL) BY MOUTH DAILY. 12/26/16  Yes Mar Daring, PA-C  venlafaxine XR (EFFEXOR-XR) 150 MG 24 hr capsule Take 1 capsule (150 mg total) by mouth daily with breakfast. 12/26/16  Yes Mar Daring, PA-C    Family History  Problem Relation Age of Onset  . Heart attack Mother        defribillator in place  . Hypertension Mother   . Hyperlipidemia Mother   . Heart disease Mother        CHF with defibrillator;  CAD.  . Diabetes Mother   . Cancer Mother 7       Lung cancer  . Hypertension Father   . Hyperlipidemia Father   . Cancer Father 59        prostate cancer s/p  implants  . COPD Father   . Hypertension Sister   . Heart disease Maternal Grandmother   . Heart disease Maternal Grandfather   . Cancer Paternal Grandmother   . Cancer Paternal Grandfather   . Hypertension Sister      Social History  Substance Use Topics  . Smoking status: Former Smoker    Packs/day: 1.50    Years: 35.00    Types: Cigarettes    Quit date: 11/14/2011  . Smokeless tobacco: Never Used     Comment: quit 2 weeks prior to CABG 12/2011  . Alcohol use 0.6 oz/week    1 Cans of beer per week     Comment: Drinks once per month; beer.    Allergies as of 05/28/2017 - Review Complete 05/28/2017  Allergen Reaction Noted  . Penicillins Itching 05/27/2012  . Tramadol Rash 01/01/2012    Review of Systems:    All systems reviewed and negative except where noted in HPI.   Physical Exam:  BP 127/69   Pulse 62   Temp 98.3 F (36.8 C)   Ht 5\' 10"  (1.778 m)   Wt 174 lb 8 oz (79.2 kg)   BMI 25.04 kg/m  No LMP for male patient. Psych:  Alert and cooperative. Normal mood and affect. General:   Alert,  Well-developed,  well-nourished, pleasant and cooperative in NAD Head:  Normocephalic and atraumatic. Eyes:  Sclera clear, no icterus.   Conjunctiva pink. Ears:  Normal auditory acuity. Nose:  No deformity, discharge, or lesions. Mouth:  No deformity or lesions,oropharynx pink & moist. Neck:  Supple; no masses or thyromegaly. Lungs:  Respirations even and unlabored.  Clear throughout to auscultation.   No wheezes, crackles,  or rhonchi. No acute distress. Heart:  Regular rate and rhythm; no murmurs, clicks, rubs, or gallops. Abdomen:  Normal bowel sounds.  No bruits.  Soft, non-tender and non-distended without masses, hepatosplenomegaly or hernias noted.  No guarding or rebound tenderness.  Negative Carnett sign.   Rectal:  Deferred.  Msk:  Symmetrical without gross deformities.  Good, equal movement & strength bilaterally. Pulses:  Normal pulses noted. Extremities:  No clubbing or edema.  No cyanosis. Neurologic:  Alert and oriented x3;  grossly normal neurologically. Skin:  Intact without significant lesions or rashes.  No jaundice. Lymph Nodes:  No significant cervical adenopathy. Psych:  Alert and cooperative. Normal mood and affect.  Imaging Studies: No results found.  Assessment and Plan:   NETTIE CROMWELL is a 56 y.o. y/o male who comes in today with a history of iron deficiency anemia that was treated with iron and he responded well.  The patient denies any GI complaints at present time.  Due to the patient having significant anemia the patient will be set up for an EGD and colonoscopy. I have discussed risks & benefits which include, but are not limited to, bleeding, infection, perforation & drug reaction.  The patient agrees with this plan & written consent will be obtained.     Joseph Lame, Joseph Terry. Joseph Terry   Note: This dictation was prepared with Dragon dictation along with smaller phrase technology. Any transcriptional errors that result from this process are unintentional.

## 2017-06-01 ENCOUNTER — Encounter: Payer: Self-pay | Admitting: Physician Assistant

## 2017-06-01 ENCOUNTER — Telehealth: Payer: Self-pay | Admitting: Gastroenterology

## 2017-06-01 ENCOUNTER — Encounter: Payer: Self-pay | Admitting: *Deleted

## 2017-06-01 NOTE — Telephone Encounter (Signed)
Patient left a voice message that he didn't receive any information in the mail regarding his upcoming procedure on the 20th. Please sent it to Joseph [email or web address]. I'm not sure if his prep was called in.

## 2017-06-02 NOTE — Telephone Encounter (Signed)
Spoke with pt and he stated he does have his instructions that I gave him during his office visit. He was told he should have been given a special diet. I instructed him the diet restrictions are listed on his paperwork he received.

## 2017-06-03 NOTE — Discharge Instructions (Signed)
General Anesthesia, Adult, Care After °These instructions provide you with information about caring for yourself after your procedure. Your health care provider may also give you more specific instructions. Your treatment has been planned according to current medical practices, but problems sometimes occur. Call your health care provider if you have any problems or questions after your procedure. °What can I expect after the procedure? °After the procedure, it is common to have: °· Vomiting. °· A sore throat. °· Mental slowness. ° °It is common to feel: °· Nauseous. °· Cold or shivery. °· Sleepy. °· Tired. °· Sore or achy, even in parts of your body where you did not have surgery. ° °Follow these instructions at home: °For at least 24 hours after the procedure: °· Do not: °? Participate in activities where you could fall or become injured. °? Drive. °? Use heavy machinery. °? Drink alcohol. °? Take sleeping pills or medicines that cause drowsiness. °? Make important decisions or sign legal documents. °? Take care of children on your own. °· Rest. °Eating and drinking °· If you vomit, drink water, juice, or soup when you can drink without vomiting. °· Drink enough fluid to keep your urine clear or pale yellow. °· Make sure you have little or no nausea before eating solid foods. °· Follow the diet recommended by your health care provider. °General instructions °· Have a responsible adult stay with you until you are awake and alert. °· Return to your normal activities as told by your health care provider. Ask your health care provider what activities are safe for you. °· Take over-the-counter and prescription medicines only as told by your health care provider. °· If you smoke, do not smoke without supervision. °· Keep all follow-up visits as told by your health care provider. This is important. °Contact a health care provider if: °· You continue to have nausea or vomiting at home, and medicines are not helpful. °· You  cannot drink fluids or start eating again. °· You cannot urinate after 8-12 hours. °· You develop a skin rash. °· You have fever. °· You have increasing redness at the site of your procedure. °Get help right away if: °· You have difficulty breathing. °· You have chest pain. °· You have unexpected bleeding. °· You feel that you are having a life-threatening or urgent problem. °This information is not intended to replace advice given to you by your health care provider. Make sure you discuss any questions you have with your health care provider. °Document Released: 12/08/2000 Document Revised: 02/04/2016 Document Reviewed: 08/16/2015 °Elsevier Interactive Patient Education © 2018 [employer] ° °

## 2017-06-04 ENCOUNTER — Ambulatory Visit: Payer: BLUE CROSS/BLUE SHIELD | Admitting: Anesthesiology

## 2017-06-04 ENCOUNTER — Encounter
Admission: RE | Disposition: A | Discharge status: Home / Self Care | Payer: Self-pay | Source: Ambulatory Visit | Attending: Gastroenterology

## 2017-06-04 ENCOUNTER — Ambulatory Visit
Admission: RE | Admit: 2017-06-04 | Discharge: 2017-06-04 | Disposition: A | Discharge status: Home / Self Care | Payer: BLUE CROSS/BLUE SHIELD | Source: Ambulatory Visit | Attending: Gastroenterology | Admitting: Gastroenterology

## 2017-06-04 DIAGNOSIS — I2510 Atherosclerotic heart disease of native coronary artery without angina pectoris: Secondary | ICD-10-CM | POA: Diagnosis not present

## 2017-06-04 DIAGNOSIS — Z888 Allergy status to other drugs, medicaments and biological substances status: Secondary | ICD-10-CM | POA: Insufficient documentation

## 2017-06-04 DIAGNOSIS — K219 Gastro-esophageal reflux disease without esophagitis: Secondary | ICD-10-CM | POA: Insufficient documentation

## 2017-06-04 DIAGNOSIS — Z951 Presence of aortocoronary bypass graft: Secondary | ICD-10-CM | POA: Insufficient documentation

## 2017-06-04 DIAGNOSIS — K635 Polyp of colon: Secondary | ICD-10-CM

## 2017-06-04 DIAGNOSIS — F329 Major depressive disorder, single episode, unspecified: Secondary | ICD-10-CM | POA: Insufficient documentation

## 2017-06-04 DIAGNOSIS — F419 Anxiety disorder, unspecified: Secondary | ICD-10-CM | POA: Diagnosis not present

## 2017-06-04 DIAGNOSIS — D125 Benign neoplasm of sigmoid colon: Secondary | ICD-10-CM

## 2017-06-04 DIAGNOSIS — I10 Essential (primary) hypertension: Secondary | ICD-10-CM | POA: Insufficient documentation

## 2017-06-04 DIAGNOSIS — E119 Type 2 diabetes mellitus without complications: Secondary | ICD-10-CM | POA: Diagnosis not present

## 2017-06-04 DIAGNOSIS — Z79899 Other long term (current) drug therapy: Secondary | ICD-10-CM | POA: Insufficient documentation

## 2017-06-04 DIAGNOSIS — E785 Hyperlipidemia, unspecified: Secondary | ICD-10-CM | POA: Insufficient documentation

## 2017-06-04 DIAGNOSIS — Z7984 Long term (current) use of oral hypoglycemic drugs: Secondary | ICD-10-CM | POA: Insufficient documentation

## 2017-06-04 DIAGNOSIS — K641 Second degree hemorrhoids: Secondary | ICD-10-CM | POA: Insufficient documentation

## 2017-06-04 DIAGNOSIS — D509 Iron deficiency anemia, unspecified: Secondary | ICD-10-CM | POA: Insufficient documentation

## 2017-06-04 DIAGNOSIS — K5730 Diverticulosis of large intestine without perforation or abscess without bleeding: Secondary | ICD-10-CM | POA: Diagnosis not present

## 2017-06-04 DIAGNOSIS — Z880 Allergy status to penicillin: Secondary | ICD-10-CM | POA: Insufficient documentation

## 2017-06-04 DIAGNOSIS — G4730 Sleep apnea, unspecified: Secondary | ICD-10-CM | POA: Diagnosis not present

## 2017-06-04 DIAGNOSIS — Z87891 Personal history of nicotine dependence: Secondary | ICD-10-CM | POA: Insufficient documentation

## 2017-06-04 DIAGNOSIS — D500 Iron deficiency anemia secondary to blood loss (chronic): Secondary | ICD-10-CM | POA: Diagnosis not present

## 2017-06-04 HISTORY — PX: ESOPHAGOGASTRODUODENOSCOPY: SHX5428

## 2017-06-04 HISTORY — PX: POLYPECTOMY: SHX5525

## 2017-06-04 HISTORY — DX: Presence of spectacles and contact lenses: Z97.3

## 2017-06-04 HISTORY — PX: COLONOSCOPY WITH PROPOFOL: SHX5780

## 2017-06-04 HISTORY — DX: Motion sickness, initial encounter: T75.3XXA

## 2017-06-04 LAB — GLUCOSE, CAPILLARY: Glucose-Capillary: 179 mg/dL — ABNORMAL HIGH (ref 65–99)

## 2017-06-04 SURGERY — COLONOSCOPY WITH PROPOFOL
Anesthesia: General

## 2017-06-04 MED ORDER — GLYCOPYRROLATE 0.2 MG/ML IJ SOLN
INTRAMUSCULAR | Status: DC | PRN
  Administered 2017-06-04: 0.1 mg via INTRAVENOUS

## 2017-06-04 MED ORDER — PROPOFOL 10 MG/ML IV BOLUS
INTRAVENOUS | Status: DC | PRN
  Administered 2017-06-04: 40 mg via INTRAVENOUS
  Administered 2017-06-04: 100 mg via INTRAVENOUS
  Administered 2017-06-04 (×3): 30 mg via INTRAVENOUS

## 2017-06-04 MED ORDER — LIDOCAINE HCL (CARDIAC) 20 MG/ML IV SOLN
INTRAVENOUS | Status: DC | PRN
  Administered 2017-06-04: 50 mg via INTRAVENOUS

## 2017-06-04 MED ORDER — ACETAMINOPHEN 325 MG PO TABS: 325.0000 mg | ORAL_TABLET | ORAL | Status: DC | PRN

## 2017-06-04 MED ORDER — ONDANSETRON HCL 4 MG/2ML IJ SOLN: 4.0000 mg | Freq: Once | INTRAMUSCULAR | Status: DC | PRN

## 2017-06-04 MED ORDER — LACTATED RINGERS IV SOLN
10.0000 mL/h | INTRAVENOUS | Status: DC
  Administered 2017-06-04: 10 mL/h via INTRAVENOUS

## 2017-06-04 MED ORDER — SODIUM CHLORIDE 0.9 % IV SOLN: INTRAVENOUS | Status: DC

## 2017-06-04 MED ORDER — STERILE WATER FOR IRRIGATION IR SOLN
Status: DC | PRN
  Administered 2017-06-04: 07:00:00

## 2017-06-04 MED ORDER — ACETAMINOPHEN 160 MG/5ML PO SOLN: 325.0000 mg | ORAL | Status: DC | PRN

## 2017-06-04 SURGICAL SUPPLY — 35 items
BALLN DILATOR 10-12 8 (BALLOONS)
BALLN DILATOR 12-15 8 (BALLOONS)
BALLN DILATOR 15-18 8 (BALLOONS)
BALLN DILATOR CRE 0-12 8 (BALLOONS)
BALLN DILATOR ESOPH 8 10 CRE (MISCELLANEOUS) IMPLANT
BALLOON DILATOR 12-15 8 (BALLOONS) IMPLANT
BALLOON DILATOR 15-18 8 (BALLOONS) IMPLANT
BALLOON DILATOR CRE 0-12 8 (BALLOONS) IMPLANT
BLOCK BITE 60FR ADLT L/F GRN (MISCELLANEOUS) ×2 IMPLANT
CANISTER SUCT 1200ML W/VALVE (MISCELLANEOUS) ×2 IMPLANT
CLIP HMST 235XBRD CATH ROT (MISCELLANEOUS) IMPLANT
CLIP RESOLUTION 360 11X235 (MISCELLANEOUS)
FCP ESCP3.2XJMB 240X2.8X (MISCELLANEOUS) ×1
FORCEPS BIOP RAD 4 LRG CAP 4 (CUTTING FORCEPS) IMPLANT
FORCEPS BIOP RJ4 240 W/NDL (MISCELLANEOUS) ×1
FORCEPS ESCP3.2XJMB 240X2.8X (MISCELLANEOUS) ×1 IMPLANT
GOWN CVR UNV OPN BCK APRN NK (MISCELLANEOUS) ×2 IMPLANT
GOWN ISOL THUMB LOOP REG UNIV (MISCELLANEOUS) ×2
INJECTOR VARIJECT VIN23 (MISCELLANEOUS) IMPLANT
KIT DEFENDO VALVE AND CONN (KITS) IMPLANT
KIT ENDO PROCEDURE OLY (KITS) ×2 IMPLANT
MARKER SPOT ENDO TATTOO 5ML (MISCELLANEOUS) IMPLANT
PAD GROUND ADULT SPLIT (MISCELLANEOUS) IMPLANT
PROBE APC STR FIRE (PROBE) IMPLANT
RETRIEVER NET PLAT FOOD (MISCELLANEOUS) IMPLANT
RETRIEVER NET ROTH 2.5X230 LF (MISCELLANEOUS) IMPLANT
SNARE SHORT THROW 13M SML OVAL (MISCELLANEOUS) IMPLANT
SNARE SHORT THROW 30M LRG OVAL (MISCELLANEOUS) IMPLANT
SNARE SNG USE RND 15MM (INSTRUMENTS) IMPLANT
SPOT EX ENDOSCOPIC TATTOO (MISCELLANEOUS)
SYR INFLATION 60ML (SYRINGE) IMPLANT
TRAP ETRAP POLY (MISCELLANEOUS) IMPLANT
VARIJECT INJECTOR VIN23 (MISCELLANEOUS)
WATER STERILE IRR 250ML POUR (IV SOLUTION) ×2 IMPLANT
WIRE CRE 18-20MM 8CM F G (MISCELLANEOUS) IMPLANT

## 2017-06-04 NOTE — Anesthesia Postprocedure Evaluation (Signed)
Anesthesia Post Note  Patient: Joseph Terry  Procedure(s) Performed: Procedure(s) (LRB): COLONOSCOPY WITH PROPOFOL (N/A) ESOPHAGOGASTRODUODENOSCOPY (EGD) (N/A) POLYPECTOMY (N/A)  Patient location during evaluation: PACU Anesthesia Type: General Level of consciousness: awake Pain management: pain level controlled Vital Signs Assessment: post-procedure vital signs reviewed and stable Respiratory status: respiratory function stable Cardiovascular status: stable Postop Assessment: no signs of nausea or vomiting Anesthetic complications: no    Veda Canning

## 2017-06-04 NOTE — Op Note (Signed)
[facility] Patient Name: Joseph Terry Procedure Date: 06/04/2017 7:45 AM MRN: [id number] Account #: [id number] Date of Birth: 1961-01-30 Admit Type: Outpatient Age: 56 Room: [facility] OR ROOM 01 Gender: Male Note Status: Finalized Procedure:            Colonoscopy Indications:          Iron deficiency anemia Providers:            [name] MD, MD Referring MD:         Mar Daring (Referring MD) Medicines:            Propofol per Anesthesia Complications:        No immediate complications. Procedure:            Pre-Anesthesia Assessment:                       - Prior to the procedure, a History and Physical was                        performed, and patient medications and allergies were                        reviewed. The patient's tolerance of previous                        anesthesia was also reviewed. The risks and benefits of                        the procedure and the sedation options and risks were                        discussed with the patient. All questions were                        answered, and informed consent was obtained. Prior                        Anticoagulants: The patient has taken no previous                        anticoagulant or antiplatelet agents. ASA Grade                        Assessment: II - A patient with mild systemic disease.                        After reviewing the risks and benefits, the patient was                        deemed in satisfactory condition to undergo the                        procedure.                       After obtaining informed consent, the colonoscope was                        passed under direct vision. Throughout the procedure,  the patient's blood pressure, pulse, and oxygen                        saturations were monitored continuously. The Olympus                        CF-HQ190L Colonoscope (S#. [id number]) was introduced   through the anus and advanced to the the cecum,                        identified by appendiceal orifice and ileocecal valve.                        The colonoscopy was performed without difficulty. The                        patient tolerated the procedure well. The quality of                        the bowel preparation was good. Findings:      The perianal and digital rectal examinations were normal.      Two sessile polyps were found in the sigmoid colon. The polyps were 2 to       3 mm in size. These polyps were removed with a cold biopsy forceps.       Resection and retrieval were complete.      Multiple small-mouthed diverticula were found in the entire colon.      Non-bleeding internal hemorrhoids were found during retroflexion. The       hemorrhoids were Grade II (internal hemorrhoids that prolapse but reduce       spontaneously). Impression:           - Two 2 to 3 mm polyps in the sigmoid colon, removed                        with a cold biopsy forceps. Resected and retrieved.                       - Diverticulosis in the entire examined colon.                       - Non-bleeding internal hemorrhoids. Recommendation:       - Discharge patient to home.                       - Resume previous diet.                       - Continue present medications.                       - Await pathology results.                       - Repeat colonoscopy in 5 years if polyp adenoma and 10                        years if hyperplastic Procedure Code(s):    --- Professional ---                       [phone], Colonoscopy, flexible; with  biopsy, single or                        multiple Diagnosis Code(s):    --- Professional ---                       D50.9, Iron deficiency anemia, unspecified                       D12.5, Benign neoplasm of sigmoid colon CPT copyright 2016 American Medical Association. All rights reserved. The codes documented in this report are preliminary and upon coder review  may  be revised to meet current compliance requirements. [name] MD, MD 06/04/2017 8:15:09 AM This report has been signed electronically. Number of Addenda: 0 Note Initiated On: 06/04/2017 7:45 AM Scope Withdrawal Time: 0 hours 8 minutes 25 seconds  Total Procedure Duration: 0 hours 12 minutes 31 seconds       [facility]

## 2017-06-04 NOTE — H&P (Signed)
[name], MD [address] [place], [place] [address] Phone:[phone] Fax : [phone]  Primary Care Physician:  Mar Daring, PA-C Primary Gastroenterologist:  Dr. Allen Norris  Pre-Procedure History & Physical: HPI:  Joseph Terry is a 56 y.o. male is here for an endoscopy and colonoscopy.   Past Medical History:  Diagnosis Date  . Anxiety state, unspecified   . CAD (coronary artery disease), severe multivessel CAD surgical consult for CABG    a. 12/2011 Cath: 3 vessel CAD with normal EF;  b. 12/2011 CABG x 5: LIMA to LAD, SVG to distal RCA with sequential SVG to PDA, SVG to OM, and SVG to D2;  c. 03/2014 ETT: Ex time 6:21, 59mm horizontal ST dep in V3-V6, HTN response.  . Depression   . Diaphragmatic hernia without mention of obstruction or gangrene   . Diverticulosis   . GERD (gastroesophageal reflux disease)   . Hyperlipidemia   . Hypertension   . Impotence of organic origin   . Internal hemorrhoids without mention of complication   . Iron deficiency anemia due to chronic blood loss 04/15/2017  . Kidney stone   . Motion sickness    cars, boats  . OSA on CPAP    a. Sleep study 07/2013 +sever OSA; CPAP at 10cm recommended.  . Personal history of colonic polyps   . Type II diabetes mellitus ([place])   . Wears contact lenses     Past Surgical History:  Procedure Laterality Date  . CARDIAC CATHETERIZATION  4 /19/ 2013   [place]  11/03/13  . COLONOSCOPY  11/01/13   multiple polyps; diverticulosis. [facility]. Repeat 5 years.  . COLONOSCOPY W/ POLYPECTOMY  09/16/2007   colon polyps.  Repeat 5 years. [facility].  . CORONARY ARTERY BYPASS GRAFT  01/05/2012   Procedure: CORONARY ARTERY BYPASS GRAFTING (CABG);  Surgeon: Rexene Alberts, MD;  Location: [place];  Service: Open Heart Surgery;  Laterality: N/A;  coronary artery bypass graft times five using left internal mammary artery and right leg greater saphenous vein harvested endoscopically     . LEFT HEART CATHETERIZATION WITH CORONARY ANGIOGRAM N/A 01/02/2012   Procedure: LEFT HEART CATHETERIZATION WITH CORONARY ANGIOGRAM;  Surgeon: Troy Sine, MD;  Location: [facility] C[facility] LAB;  Service: Cardiovascular;  Laterality: N/A;  . POLYSOMNOGRAPHY  07/2013   +severe OSA; CPAP at 10cm pressure recommended.  . rotator cuff curgery  2011   lt rotator cuff  . Shoulder surgery  1997   bilateral  . WISDOM TOOTH EXTRACTION      Prior to Admission medications   Medication Sig Start Date End Date Taking? Authorizing Provider  atorvastatin (LIPITOR) 40 MG tablet TAKE 1 TABLET (40 MG TOTAL) BY MOUTH DAILY AT 6 PM 03/30/17  Yes Burnette, Jennifer M, PA-C  clonazePAM (KLONOPIN) 1 MG tablet TAKE 1 TABLET BY MOUTH TWICE DAILY AS NEEDED 05/08/17  Yes Fenton Malling M, PA-C  dapagliflozin propanediol (FARXIGA) 10 MG TABS tablet Take 10 mg by mouth daily. 04/16/17  Yes Fenton Malling M, PA-C  glipiZIDE (GLUCOTROL XL) 10 MG 24 hr tablet Take 1 tablet (10 mg total) by mouth daily with breakfast. 12/26/16  Yes Burnette, Clearnce Sorrel, PA-C  metFORMIN (GLUCOPHAGE) 1000 MG tablet TAKE 1 TABLET (1,000 MG TOTAL) BY MOUTH 2 (TWO) TIMES DAILY WITH A MEAL. 12/26/16  Yes Mar Daring, PA-C  metoprolol tartrate (LOPRESSOR) 25 MG tablet TAKE 1 TABLET(25 MG) BY MOUTH TWICE DAILY 05/21/17  Yes Burnette,  Clearnce Sorrel, PA-C  omeprazole (PRILOSEC) 40 MG capsule Take 40 mg by mouth daily.   Yes [provider]  sitaGLIPtin (JANUVIA) 100 MG tablet TAKE 1 TABLET (100 MG TOTAL) BY MOUTH DAILY. 12/26/16  Yes Mar Daring, PA-C  venlafaxine XR (EFFEXOR-XR) 150 MG 24 hr capsule Take 1 capsule (150 mg total) by mouth daily with breakfast. 12/26/16  Yes Mar Daring, PA-C    Allergies as of 05/28/2017 - Review Complete 05/28/2017  Allergen Reaction Noted  . Penicillins Itching 05/27/2012  . Tramadol Rash 01/01/2012    Family History  Problem Relation Age of Onset  . Heart attack Mother         defribillator in place  . Hypertension Mother   . Hyperlipidemia Mother   . Heart disease Mother        CHF with defibrillator;  CAD.  . Diabetes Mother   . Cancer Mother 62       Lung cancer  . Hypertension Father   . Hyperlipidemia Father   . Cancer Father 47        prostate cancer s/p  implants  . COPD Father   . Hypertension Sister   . Heart disease Maternal Grandmother   . Heart disease Maternal Grandfather   . Cancer Paternal Grandmother   . Cancer Paternal Grandfather   . Hypertension Sister     Social History   Social History  . Marital status: Divorced    Spouse name: N/A  . Number of children: 1  . Years of education: 9   Occupational History  . [employer] attorney    x 30 yrs   Social History Main Topics  . Smoking status: Former Smoker    Packs/day: 1.50    Years: 35.00    Types: Cigarettes    Quit date: 11/14/2011  . Smokeless tobacco: Never Used     Comment: quit 2 weeks prior to CABG 12/2011  . Alcohol use 0.6 oz/week    1 Cans of beer per week     Comment: Drinks once per month; beer.  . Drug use: No  . Sexual activity: Yes    Partners: Female    Birth control/ protection: Condom   Other Topics Concern  . Not on file   Social History Narrative   Marital status: divorced since 1996; dating casually in 2016.        Lives: Lives alone.          Children:  One son and a granddaughter 71 years old live in [place].       Tobacco: quit with CABG      Alcohol: weekends; 6-8 beers on average.  Beers with football; ten on football weekends.      Exercise: Light, yard work.        Employment:  Works 45-50 hrs. Some weeks.  [employer].  Desk work since 05/2013. Since 1979.      Guns:  Guns in the home not stored in locked cabinet. Smoke alarm in home, Always wears seatbelts.       Sexual History: active;Last HIV testing 2015; has has 25+ partners; females only.             Review of Systems: See HPI, otherwise negative  ROS  Physical Exam: BP 116/68   Pulse (!) 58   Temp 97.7 F (36.5 C) (Temporal)   Resp 16   Ht 5\' 10"  (1.778 m)   Wt 165 lb (74.8 kg)  SpO2 99%   BMI 23.68 kg/m  General:   Alert,  pleasant and cooperative in NAD Head:  Normocephalic and atraumatic. Neck:  Supple; no masses or thyromegaly. Lungs:  Clear throughout to auscultation.    Heart:  Regular rate and rhythm. Abdomen:  Soft, nontender and nondistended. Normal bowel sounds, without guarding, and without rebound.   Neurologic:  Alert and  oriented x4;  grossly normal neurologically.  Impression/Plan: Tayshawn Purnell Bertone is here for an endoscopy and colonoscopy to be performed for IDA  Risks, benefits, limitations, and alternatives regarding  endoscopy and colonoscopy have been reviewed with the patient.  Questions have been answered.  All parties agreeable.   [name], MD  06/04/2017, 7:26 AM

## 2017-06-04 NOTE — Anesthesia Procedure Notes (Signed)
Performed by: Cameron Ali

## 2017-06-04 NOTE — Transfer of Care (Signed)
Immediate Anesthesia Transfer of Care Note  Patient: Joseph Terry  Procedure(s) Performed: Procedure(s) with comments: COLONOSCOPY WITH PROPOFOL (N/A) - needs to remain first patient stated he also suppose to get EGD also ESOPHAGOGASTRODUODENOSCOPY (EGD) (N/A) - Diabetic - oral meds POLYPECTOMY (N/A)  Patient Location: PACU  Anesthesia Type: General  Level of Consciousness: awake, alert  and patient cooperative  Airway and Oxygen Therapy: Patient Spontanous Breathing and Patient connected to supplemental oxygen  Post-op Assessment: Post-op Vital signs reviewed, Patient's Cardiovascular Status Stable, Respiratory Function Stable, Patent Airway and No signs of Nausea or vomiting  Post-op Vital Signs: Reviewed and stable  Complications: No apparent anesthesia complications

## 2017-06-04 NOTE — Anesthesia Preprocedure Evaluation (Signed)
Anesthesia Evaluation    Airway Mallampati: II  TM Distance: <3 FB     Dental  (+) Teeth Intact   Pulmonary sleep apnea , former smoker,    breath sounds clear to auscultation       Cardiovascular hypertension, + CAD (s/p CABG 2013)   Rhythm:Regular Rate:Normal     Neuro/Psych PSYCHIATRIC DISORDERS Anxiety Depression    GI/Hepatic GERD  ,  Endo/Other  diabetes  Renal/GU      Musculoskeletal   Abdominal   Peds  Hematology  (+) anemia ,   Anesthesia Other Findings   Reproductive/Obstetrics                            Anesthesia Physical Anesthesia Plan  ASA: III  Anesthesia Plan: General   Post-op Pain Management:    Induction: Intravenous  PONV Risk Score and Plan:   Airway Management Planned: Natural Airway and Nasal Cannula  Additional Equipment:   Intra-op Plan:   Post-operative Plan:   Informed Consent: I have reviewed the patients History and Physical, chart, labs and discussed the procedure including the risks, benefits and alternatives for the proposed anesthesia with the patient or authorized representative who has indicated his/her understanding and acceptance.   Dental advisory given  Plan Discussed with: CRNA  Anesthesia Plan Comments:        Anesthesia Quick Evaluation

## 2017-06-04 NOTE — Op Note (Signed)
[facility] Patient Name: Joseph Terry Procedure Date: 06/04/2017 7:21 AM MRN: [id number] Account #: [id number] Date of Birth: 1960/09/30 Admit Type: Outpatient Age: 56 Room: [facility] OR ROOM 01 Gender: Male Note Status: Finalized Procedure:            Upper GI endoscopy Indications:          Iron deficiency anemia Providers:            [name] MD, MD Referring MD:         Mar Daring (Referring MD) Medicines:            Propofol per Anesthesia Complications:        No immediate complications. Procedure:            Pre-Anesthesia Assessment:                       - Prior to the procedure, a History and Physical was                        performed, and patient medications and allergies were                        reviewed. The patient's tolerance of previous                        anesthesia was also reviewed. The risks and benefits of                        the procedure and the sedation options and risks were                        discussed with the patient. All questions were                        answered, and informed consent was obtained. Prior                        Anticoagulants: The patient has taken no previous                        anticoagulant or antiplatelet agents. ASA Grade                        Assessment: II - A patient with mild systemic disease.                        After reviewing the risks and benefits, the patient was                        deemed in satisfactory condition to undergo the                        procedure.                       After obtaining informed consent, the endoscope was                        passed under direct vision. Throughout the procedure,  the patient's blood pressure, pulse, and oxygen                        saturations were monitored continuously. The Olympus                        [id number]-HQ190 Endoscope (S#. [id number]) was introduced   through the mouth, and advanced to the second part of                        duodenum. The upper GI endoscopy was accomplished                        without difficulty. The patient tolerated the procedure                        well. Findings:      The Z-line was irregular and was found at the gastroesophageal junction.       Biopsies were taken with a cold forceps for histology.      The stomach was normal.      The examined duodenum was normal. Impression:           - Z-line irregular, at the gastroesophageal junction.                        Biopsied.                       - Normal stomach.                       - Normal examined duodenum. Recommendation:       - Discharge patient to home.                       - Resume previous diet.                       - Continue present medications.                       - Await pathology results.                       - Perform a colonoscopy today. Procedure Code(s):    --- Professional ---                       [phone], Esophagogastroduodenoscopy, flexible, transoral;                        with biopsy, single or multiple Diagnosis Code(s):    --- Professional ---                       D50.9, Iron deficiency anemia, unspecified CPT copyright 2016 American Medical Association. All rights reserved. The codes documented in this report are preliminary and upon coder review may  be revised to meet current compliance requirements. [name] MD, MD 06/04/2017 7:54:48 AM This report has been signed electronically. Number of Addenda: 0 Note Initiated On: 06/04/2017 7:21 AM      [facility]

## 2017-06-04 NOTE — Anesthesia Procedure Notes (Signed)
Date/Time: 06/04/2017 7:46 AM Performed by: Cameron Ali Pre-anesthesia Checklist: Patient identified, Emergency Drugs available, Suction available, Timeout performed and Patient being monitored Patient Re-evaluated:Patient Re-evaluated prior to induction Oxygen Delivery Method: Nasal cannula Placement Confirmation: positive ETCO2

## 2017-06-05 ENCOUNTER — Encounter: Payer: Self-pay | Admitting: Gastroenterology

## 2017-06-09 ENCOUNTER — Encounter: Payer: Self-pay | Admitting: Gastroenterology

## 2017-06-12 ENCOUNTER — Inpatient Hospital Stay: Payer: BLUE CROSS/BLUE SHIELD

## 2017-06-12 ENCOUNTER — Telehealth: Payer: Self-pay

## 2017-06-12 ENCOUNTER — Inpatient Hospital Stay: Payer: BLUE CROSS/BLUE SHIELD | Attending: Oncology | Admitting: Oncology

## 2017-06-12 ENCOUNTER — Encounter: Payer: Self-pay | Admitting: Oncology

## 2017-06-12 VITALS — BP 147/86 | HR 66 | Temp 98.0°F | Resp 12 | Ht 70.0 in | Wt 171.4 lb

## 2017-06-12 DIAGNOSIS — Z951 Presence of aortocoronary bypass graft: Secondary | ICD-10-CM | POA: Diagnosis not present

## 2017-06-12 DIAGNOSIS — I10 Essential (primary) hypertension: Secondary | ICD-10-CM | POA: Insufficient documentation

## 2017-06-12 DIAGNOSIS — I2510 Atherosclerotic heart disease of native coronary artery without angina pectoris: Secondary | ICD-10-CM | POA: Diagnosis not present

## 2017-06-12 DIAGNOSIS — E119 Type 2 diabetes mellitus without complications: Secondary | ICD-10-CM | POA: Diagnosis not present

## 2017-06-12 DIAGNOSIS — Z9989 Dependence on other enabling machines and devices: Secondary | ICD-10-CM | POA: Diagnosis not present

## 2017-06-12 DIAGNOSIS — K2270 Barrett's esophagus without dysplasia: Secondary | ICD-10-CM | POA: Diagnosis not present

## 2017-06-12 DIAGNOSIS — K219 Gastro-esophageal reflux disease without esophagitis: Secondary | ICD-10-CM

## 2017-06-12 DIAGNOSIS — R5383 Other fatigue: Secondary | ICD-10-CM | POA: Insufficient documentation

## 2017-06-12 DIAGNOSIS — D500 Iron deficiency anemia secondary to blood loss (chronic): Secondary | ICD-10-CM | POA: Diagnosis not present

## 2017-06-12 DIAGNOSIS — Z86010 Personal history of colonic polyps: Secondary | ICD-10-CM | POA: Insufficient documentation

## 2017-06-12 DIAGNOSIS — Z79899 Other long term (current) drug therapy: Secondary | ICD-10-CM | POA: Insufficient documentation

## 2017-06-12 DIAGNOSIS — G4733 Obstructive sleep apnea (adult) (pediatric): Secondary | ICD-10-CM | POA: Diagnosis not present

## 2017-06-12 DIAGNOSIS — Z87442 Personal history of urinary calculi: Secondary | ICD-10-CM

## 2017-06-12 DIAGNOSIS — E785 Hyperlipidemia, unspecified: Secondary | ICD-10-CM | POA: Diagnosis not present

## 2017-06-12 LAB — IRON AND TIBC
Iron: 37 ug/dL — ABNORMAL LOW (ref 45–182)
Saturation Ratios: 12 % — ABNORMAL LOW (ref 17.9–39.5)
TIBC: 322 ug/dL (ref 250–450)
UIBC: 285 ug/dL

## 2017-06-12 LAB — CBC
HEMATOCRIT: 41.2 % (ref 40.0–52.0)
Hemoglobin: 13.7 g/dL (ref 13.0–18.0)
MCH: 25.8 pg — AB (ref 26.0–34.0)
MCHC: 33.3 g/dL (ref 32.0–36.0)
MCV: 77.6 fL — AB (ref 80.0–100.0)
PLATELETS: 374 10*3/uL (ref 150–440)
RBC: 5.31 MIL/uL (ref 4.40–5.90)
RDW: 28.5 % — ABNORMAL HIGH (ref 11.5–14.5)
WBC: 7.1 10*3/uL (ref 3.8–10.6)

## 2017-06-12 LAB — FERRITIN: FERRITIN: 20 ng/mL — AB (ref 24–336)

## 2017-06-12 NOTE — Progress Notes (Addendum)
[facility] Follow Up Note  Patient Care Team: Rubye Beach as PCP - General (Family Medicine) Terance Ice, MD as Consulting Physician (Cardiology) [name], MD as Consulting Physician (Family Medicine) Christene Lye, MD (General Surgery) Cristy Friedlander, MD (Oral Surgery)  REASON FOR VISIT Follow up for iron deficiency anemia  PERTINENT HEMATOLOGY HISTORY Joseph Terry 56 y.o. male with PMH listed as below is referred by primary care [facility] to me for evaluation and management of iron deficiency anemia. Patient reports feeling fatigued lately and he was seen by primary care March 30, 2017. His lab tests indicated anemia, thrombocytosis and iron deficiency.  He also had positive occult stool test. Per Epic records, he has had colonoscopy in 2009 and later 2015, with findings of multiple polyps and diverticulosis.   Denies weight loss, fever or chills, chest pain, SOB or abdominal pain. Denies having bright red blood in stool or black stool.  Primary is working on referring him to see GI or Dr.Sankar to get further work up.   Treatment: s/p IV Feraheme 510mg  x 2 doses.   INTERVAL HISTORY 56yo male with above history presents to follow up on iron deficiency anemia. He has EGD and Colonoscopy during the interval by Dr.Wohl. EGD biopsy showed changes consistent with Barrett's esophagus. Colonoscopy showed polyps which were hyperplastic. Negative for malignancy.  He has chronic GERD and takes PPI. He still have quite bad acid flux.   Fatigue associated with anemia improved significantly.    Review of Systems  Constitutional: Negative.   HENT:  Negative.   Eyes: Negative.   Respiratory: Negative.   Cardiovascular: Negative.   Gastrointestinal:       Acid reflux.   Endocrine: Negative.   Genitourinary: Negative.    Musculoskeletal: Negative.   Skin: Negative.   Neurological: Negative.   Hematological:  Negative.   Psychiatric/Behavioral: Negative.     MEDICAL HISTORY: Past Medical History:  Diagnosis Date  . Anxiety state, unspecified   . CAD (coronary artery disease), severe multivessel CAD surgical consult for CABG    a. 12/2011 Cath: 3 vessel CAD with normal EF;  b. 12/2011 CABG x 5: LIMA to LAD, SVG to distal RCA with sequential SVG to PDA, SVG to OM, and SVG to D2;  c. 03/2014 ETT: Ex time 6:21, 34mm horizontal ST dep in V3-V6, HTN response.  . Depression   . Diaphragmatic hernia without mention of obstruction or gangrene   . Diverticulosis   . GERD (gastroesophageal reflux disease)   . Hyperlipidemia   . Hypertension   . Impotence of organic origin   . Internal hemorrhoids without mention of complication   . Iron deficiency anemia due to chronic blood loss 04/15/2017  . Kidney stone   . Motion sickness    cars, boats  . OSA on CPAP    a. Sleep study 07/2013 +sever OSA; CPAP at 10cm recommended.  . Personal history of colonic polyps   . Type II diabetes mellitus ([place])   . Wears contact lenses     SURGICAL HISTORY: Past Surgical History:  Procedure Laterality Date  . CARDIAC CATHETERIZATION  4 /19/ 2013   [facility]  11/03/13  . COLONOSCOPY  11/01/13   multiple polyps; diverticulosis. Sankar. Repeat 5 years.  . COLONOSCOPY W/ POLYPECTOMY  09/16/2007   colon polyps.  Repeat 5 years. Iftikhar.  . COLONOSCOPY WITH PROPOFOL N/A 06/04/2017   Procedure: COLONOSCOPY WITH PROPOFOL;  Surgeon: [name], MD;  Location: [place];  Service: Gastroenterology;  Laterality: N/A;  needs to remain first patient stated he also suppose to get EGD also  . CORONARY ARTERY BYPASS GRAFT  01/05/2012   Procedure: CORONARY ARTERY BYPASS GRAFTING (CABG);  Surgeon: Rexene Alberts, MD;  Location: [place];  Service: Open Heart Surgery;  Laterality: N/A;  coronary artery bypass graft times five using left internal mammary artery and right leg greater saphenous vein harvested  endoscopically   . ESOPHAGOGASTRODUODENOSCOPY N/A 06/04/2017   Procedure: ESOPHAGOGASTRODUODENOSCOPY (EGD);  Surgeon: [name], MD;  Location: [place];  Service: Gastroenterology;  Laterality: N/A;  Diabetic - oral meds  . LEFT HEART CATHETERIZATION WITH CORONARY ANGIOGRAM N/A 01/02/2012   Procedure: LEFT HEART CATHETERIZATION WITH CORONARY ANGIOGRAM;  Surgeon: Troy Sine, MD;  Location: [facility] CATH [facility];  Service: Cardiovascular;  Laterality: N/A;  . POLYPECTOMY N/A 06/04/2017   Procedure: POLYPECTOMY;  Surgeon: [name], MD;  Location: [place];  Service: Gastroenterology;  Laterality: N/A;  . POLYSOMNOGRAPHY  07/2013   +severe OSA; CPAP at 10cm pressure recommended.  . rotator cuff curgery  2011   lt rotator cuff  . Shoulder surgery  1997   bilateral  . WISDOM TOOTH EXTRACTION      SOCIAL HISTORY: Social History   Social History  . Marital status: Divorced    Spouse name: N/A  . Number of children: 1  . Years of education: 26   Occupational History  . [employer] attorney    x 30 yrs   Social History Main Topics  . Smoking status: Former Smoker    Packs/day: 1.50    Years: 35.00    Types: Cigarettes    Quit date: 11/14/2011  . Smokeless tobacco: Never Used     Comment: quit 2 weeks prior to CABG 12/2011  . Alcohol use 0.6 oz/week    1 Cans of beer per week     Comment: Drinks once per month; beer.  . Drug use: No  . Sexual activity: Yes    Partners: Female    Birth control/ protection: Condom   Other Topics Concern  . Not on file   Social History Narrative   Marital status: divorced since 1996; dating casually in 2016.        Lives: Lives alone.          Children:  One son and a granddaughter 9 years old live in [place].       Tobacco: quit with CABG      Alcohol: weekends; 6-8 beers on average.  Beers with football; ten on football weekends.      Exercise: Light, yard work.        Employment:  Works 45-50 hrs. Some weeks.   [employer].  Desk work since 05/2013. Since 1979.      Guns:  Guns in the home not stored in locked cabinet. Smoke alarm in home, Always wears seatbelts.       Sexual History: active;Last HIV testing 2015; has has 25+ partners; females only.             FAMILY HISTORY Family History  Problem Relation Age of Onset  . Heart attack Mother        defribillator in place  . Hypertension Mother   . Hyperlipidemia Mother   . Heart disease Mother        CHF with defibrillator;  CAD.  . Diabetes Mother   . Cancer  Mother 61       Lung cancer  . Hypertension Father   . Hyperlipidemia Father   . Cancer Father 24        prostate cancer s/p  implants  . COPD Father   . Hypertension Sister   . Heart disease Maternal Grandmother   . Heart disease Maternal Grandfather   . Cancer Paternal Grandmother   . Cancer Paternal Grandfather   . Hypertension Sister     ALLERGIES:  is allergic to penicillins and tramadol.  MEDICATIONS:  Current Outpatient Prescriptions  Medication Sig Dispense Refill  . atorvastatin (LIPITOR) 40 MG tablet TAKE 1 TABLET (40 MG TOTAL) BY MOUTH DAILY AT 6 PM 90 tablet 1  . clonazePAM (KLONOPIN) 1 MG tablet TAKE 1 TABLET BY MOUTH TWICE DAILY AS NEEDED 60 tablet 5  . dapagliflozin propanediol (FARXIGA) 10 MG TABS tablet Take 10 mg by mouth daily. 90 tablet 1  . glipiZIDE (GLUCOTROL XL) 10 MG 24 hr tablet Take 1 tablet (10 mg total) by mouth daily with breakfast. 90 tablet 1  . metFORMIN (GLUCOPHAGE) 1000 MG tablet TAKE 1 TABLET (1,000 MG TOTAL) BY MOUTH 2 (TWO) TIMES DAILY WITH A MEAL. 180 tablet 1  . metoprolol tartrate (LOPRESSOR) 25 MG tablet TAKE 1 TABLET(25 MG) BY MOUTH TWICE DAILY 180 tablet 1  . omeprazole (PRILOSEC) 40 MG capsule Take 40 mg by mouth daily.    . sitaGLIPtin (JANUVIA) 100 MG tablet TAKE 1 TABLET (100 MG TOTAL) BY MOUTH DAILY. 90 tablet 1  . venlafaxine XR (EFFEXOR-XR) 150 MG 24 hr capsule Take 1 capsule (150 mg total) by mouth daily with  breakfast. 90 capsule 1   No current facility-administered medications for this visit.     PHYSICAL EXAMINATION:  ECOG PERFORMANCE STATUS: 0 - Asymptomatic   Vitals:   06/12/17 1421  BP: (!) 147/86  Pulse: 66  Resp: 12  Temp: 98 F (36.7 C)  SpO2: 97%    Filed Weights   06/12/17 1421  Weight: 171 lb 6.4 oz (77.7 kg)   Physical Exam GENERAL: No distress, well nourished.  SKIN:  No rashes or significant lesions  HEAD: Normocephalic, No masses, lesions, tenderness or abnormalities  EYES: Conjunctiva are pink, non icteric ENT: External ears normal ,lips , buccal mucosa, and tongue normal and mucous membranes are moist  LYMPH: No palpable cervical and axillary lymphadenopathy  LUNGS: Clear to auscultation, no crackles or wheezes HEART: Regular rate & rhythm, no murmurs, no gallops, S1 normal and S2 normal  ABDOMEN: Abdomen soft, non-tender, normal bowel sounds, I did not appreciate any  masses or organomegaly  MUSCULOSKELETAL: No CVA tenderness and no tenderness on percussion of the back or rib cage.  EXTREMITIES: No edema, no skin discoloration or tenderness NEURO: Alert & oriented, no focal motor/sensory deficits.   LABORATORY DATA: I have personally reviewed the data as listed: CBC    Component Value Date/Time   WBC 7.1 06/12/2017 1406   RBC 5.31 06/12/2017 1406   HGB 13.7 06/12/2017 1406   HGB 9.6 (L) 03/30/2017 0912   HCT 41.2 06/12/2017 1406   HCT 33.5 (L) 03/30/2017 0912   PLT 374 06/12/2017 1406   PLT 415 (H) 03/30/2017 0912   MCV 77.6 (L) 06/12/2017 1406   MCV 64 (L) 03/30/2017 0912   MCH 25.8 (L) 06/12/2017 1406   MCHC 33.3 06/12/2017 1406   RDW 28.5 (H) 06/12/2017 1406   RDW 19.1 (H) 03/30/2017 0912   LYMPHSABS 1.6 05/06/2017 1306  LYMPHSABS 1.9 03/30/2017 0912   MONOABS 0.4 05/06/2017 1306   EOSABS 0.5 05/06/2017 1306   EOSABS 1.2 (H) 03/30/2017 0912   BASOSABS 0.0 05/06/2017 1306   BASOSABS 0.1 03/30/2017 0912   RADIOGRAPHIC STUDIES: I have  personally reviewed the radiological images as listed and agree with the findings in the report No recent images.   ASSESSMENT/PLAN 1. Iron deficiency anemia due to chronic blood loss   2. Barrett's esophagus without dysplasia   . 1  Recent CBCreviewed with patient.  Ferritin improved as well as hemoglobin. Thrombocytosis resolved. These are indicating of repeated iron store with bone marrow regeneration.We will hold additional IV iron infusion for now.  2 Barrett;s esophagous: still having acid reflux symptoms on PPI. I advise him to make an appointemnt with Dr.Wohl to see if anything else can be done. Also he may need EGD every 3-5 years.   Follow in 3 months to recheck labs. CBC iron TIBC and ferritin All questions were answered. The patient knows to call the clinic with any problems, questions or concerns. Thank you for this kind referral and the opportunity to participate in the care of this patient. A copy of today's note is routed to referring physician Fenton Malling.   Earlie Server, MD  06/12/2017 2:16 PM   Addendum:  Ferritin dropped again. Will give additional IV iron. Sent to scheduler.  Also advise patient to follow up with GI for capsule study for small bowel.  Dr. Earlie Server, MD, PhD [facility] Pager- [phone] 06/16/2017

## 2017-06-12 NOTE — Progress Notes (Signed)
Patient here for follow up no changes since his last appt.

## 2017-06-12 NOTE — Telephone Encounter (Signed)
-----   Message from [name], MD sent at 06/10/2017  7:50 AM EDT ----- Please have the patient come in for a follow up.

## 2017-06-12 NOTE — Telephone Encounter (Signed)
Contacted pt to schedule follow up appt. Pt stated this wasn't a good time to come in and discuss. He will check his schedule and will call back.

## 2017-06-15 ENCOUNTER — Other Ambulatory Visit: Payer: Self-pay | Admitting: Oncology

## 2017-06-15 DIAGNOSIS — D500 Iron deficiency anemia secondary to blood loss (chronic): Secondary | ICD-10-CM

## 2017-06-18 ENCOUNTER — Other Ambulatory Visit: Payer: Self-pay

## 2017-06-18 DIAGNOSIS — D500 Iron deficiency anemia secondary to blood loss (chronic): Secondary | ICD-10-CM

## 2017-06-22 ENCOUNTER — Encounter: Payer: Self-pay | Admitting: Gastroenterology

## 2017-06-22 ENCOUNTER — Encounter
Admission: RE | Disposition: A | Discharge status: Home / Self Care | Payer: Self-pay | Source: Ambulatory Visit | Attending: Gastroenterology

## 2017-06-22 ENCOUNTER — Ambulatory Visit: Payer: BLUE CROSS/BLUE SHIELD | Admitting: Gastroenterology

## 2017-06-22 ENCOUNTER — Ambulatory Visit (INDEPENDENT_AMBULATORY_CARE_PROVIDER_SITE_OTHER): Payer: BLUE CROSS/BLUE SHIELD | Admitting: Gastroenterology

## 2017-06-22 ENCOUNTER — Ambulatory Visit
Admission: RE | Admit: 2017-06-22 | Discharge: 2017-06-22 | Disposition: A | Discharge status: Home / Self Care | Payer: BLUE CROSS/BLUE SHIELD | Source: Ambulatory Visit | Attending: Gastroenterology | Admitting: Gastroenterology

## 2017-06-22 VITALS — BP 144/71 | HR 66 | Temp 98.0°F | Ht 70.0 in | Wt 173.0 lb

## 2017-06-22 DIAGNOSIS — K31819 Angiodysplasia of stomach and duodenum without bleeding: Secondary | ICD-10-CM | POA: Insufficient documentation

## 2017-06-22 DIAGNOSIS — D509 Iron deficiency anemia, unspecified: Secondary | ICD-10-CM

## 2017-06-22 DIAGNOSIS — K2270 Barrett's esophagus without dysplasia: Secondary | ICD-10-CM

## 2017-06-22 HISTORY — PX: GIVENS CAPSULE STUDY: SHX5432

## 2017-06-22 SURGERY — IMAGING PROCEDURE, GI TRACT, INTRALUMINAL, VIA CAPSULE

## 2017-06-22 NOTE — Progress Notes (Signed)
    Primary Care Physician: Mar Daring, PA-C  Primary Gastroenterologist:  Dr. [name]  No chief complaint on file.   HPI: Joseph Terry is a 56 y.o. male here for follow-up after having an EGD and colonoscopy for iron deficiency anemia. The patient is in the process of having a capsule endoscopy and swallow the capsule this morning is still wearing recorded. The patient was found to have finding of esophagus consistent with Barrett's esophagus and the biopsies confirmed that finding.  No current outpatient prescriptions on file.   No current facility-administered medications for this visit.     Allergies as of 06/22/2017 - Review Complete 06/12/2017  Allergen Reaction Noted  . Penicillins Itching 05/27/2012  . Tramadol Rash 01/01/2012    ROS:  General: Negative for anorexia, weight loss, fever, chills, fatigue, weakness. ENT: Negative for hoarseness, difficulty swallowing , nasal congestion. CV: Negative for chest pain, angina, palpitations, dyspnea on exertion, peripheral edema.  Respiratory: Negative for dyspnea at rest, dyspnea on exertion, cough, sputum, wheezing.  GI: See history of present illness. GU:  Negative for dysuria, hematuria, urinary incontinence, urinary frequency, nocturnal urination.  Endo: Negative for unusual weight change.    Physical Examination:   There were no vitals taken for this visit.  General: Well-nourished, well-developed in no acute distress.  Eyes: No icterus. Conjunctivae pink. Mouth: Oropharyngeal mucosa moist and pink , no lesions erythema or exudate. Lungs: Clear to auscultation bilaterally. Non-labored. Heart: Regular rate and rhythm, no murmurs rubs or gallops.  Abdomen: Bowel sounds are normal, nontender, nondistended, no hepatosplenomegaly or masses, no abdominal bruits or hernia , no rebound or guarding.   Extremities: No lower extremity edema. No clubbing or deformities. Neuro: Alert and oriented x 3.  Grossly  intact. Skin: Warm and dry, no jaundice.   Psych: Alert and cooperative, normal mood and affect.  Labs:    Imaging Studies: No results found.  Assessment and Plan:   Joseph Terry is a 56 y.o. y/o male with iron deficiency anemia. The patient will have the recording device removed today and downloaded tomorrow. The patient has also been informed the implications of having Barrett's esophagus and has been recommended to have a repeat EGD in 3 years.    [name], MD. Marval Regal   Note: This dictation was prepared with Dragon dictation along with smaller phrase technology. Any transcriptional errors that result from this process are unintentional.

## 2017-06-24 ENCOUNTER — Other Ambulatory Visit: Payer: Self-pay | Admitting: Physician Assistant

## 2017-06-24 DIAGNOSIS — E7800 Pure hypercholesterolemia, unspecified: Secondary | ICD-10-CM

## 2017-06-30 ENCOUNTER — Telehealth: Payer: Self-pay

## 2017-06-30 NOTE — Telephone Encounter (Signed)
Please review pt's capsule study results. He is calling requesting results.

## 2017-06-30 NOTE — Telephone Encounter (Signed)
Am doing it now

## 2017-07-01 ENCOUNTER — Telehealth: Payer: Self-pay | Admitting: *Deleted

## 2017-07-01 ENCOUNTER — Telehealth: Payer: Self-pay | Admitting: Oncology

## 2017-07-01 NOTE — Telephone Encounter (Signed)
Patient asking about coming back in for Iron infusions as suggested by D rYu. He has had an upper and lower endo as well as a capsule study ( he has not heard results form latter) Please send message to scheduling and enter orders for iron if in agreement.

## 2017-07-01 NOTE — Telephone Encounter (Signed)
Feraheme once weekly x 2 weeks, Lab/MD in 3 weeks scheduled and conf with patient, per 10/17 patient call msg/julie.

## 2017-07-01 NOTE — Telephone Encounter (Signed)
Per Dr Tasia Catchings please schedule pt for:   Feraheme once weekly x2 weeks.    Lab/md in 3 weeks with labs 1-2 days prior to seeing md

## 2017-07-02 ENCOUNTER — Encounter: Payer: Self-pay | Admitting: Gastroenterology

## 2017-07-02 ENCOUNTER — Telehealth: Payer: Self-pay | Admitting: Oncology

## 2017-07-02 ENCOUNTER — Encounter: Payer: Self-pay | Admitting: Physician Assistant

## 2017-07-02 NOTE — Telephone Encounter (Signed)
Please abstract and update eye exam. Document is under scanned media from 10/15/16 with Dr. Matilde Sprang showing no diabetic retinopathy.

## 2017-07-02 NOTE — Telephone Encounter (Signed)
Attempted to call patient several times, line busy

## 2017-07-06 ENCOUNTER — Ambulatory Visit: Payer: Self-pay | Admitting: Physician Assistant

## 2017-07-07 ENCOUNTER — Other Ambulatory Visit: Payer: Self-pay | Admitting: Oncology

## 2017-07-07 ENCOUNTER — Inpatient Hospital Stay: Payer: BLUE CROSS/BLUE SHIELD | Attending: Oncology

## 2017-07-07 VITALS — BP 126/74 | HR 66 | Temp 98.6°F | Resp 20

## 2017-07-07 DIAGNOSIS — K2270 Barrett's esophagus without dysplasia: Secondary | ICD-10-CM | POA: Diagnosis not present

## 2017-07-07 DIAGNOSIS — D500 Iron deficiency anemia secondary to blood loss (chronic): Secondary | ICD-10-CM | POA: Insufficient documentation

## 2017-07-07 MED ORDER — SODIUM CHLORIDE 0.9 % IV SOLN
Freq: Once | INTRAVENOUS | Status: AC
  Administered 2017-07-07: 14:00:00 via INTRAVENOUS
  Filled 2017-07-07: qty 1000

## 2017-07-07 MED ORDER — SODIUM CHLORIDE 0.9 % IV SOLN
510.0000 mg | Freq: Once | INTRAVENOUS | Status: AC
  Administered 2017-07-07: 510 mg via INTRAVENOUS
  Filled 2017-07-07: qty 17

## 2017-07-08 DIAGNOSIS — E119 Type 2 diabetes mellitus without complications: Secondary | ICD-10-CM | POA: Insufficient documentation

## 2017-07-08 DIAGNOSIS — F419 Anxiety disorder, unspecified: Secondary | ICD-10-CM | POA: Insufficient documentation

## 2017-07-08 DIAGNOSIS — E1165 Type 2 diabetes mellitus with hyperglycemia: Secondary | ICD-10-CM | POA: Insufficient documentation

## 2017-07-14 ENCOUNTER — Inpatient Hospital Stay: Payer: BLUE CROSS/BLUE SHIELD

## 2017-07-14 VITALS — BP 130/76 | HR 71 | Temp 98.2°F | Resp 18

## 2017-07-14 DIAGNOSIS — D500 Iron deficiency anemia secondary to blood loss (chronic): Secondary | ICD-10-CM | POA: Diagnosis not present

## 2017-07-14 DIAGNOSIS — K2270 Barrett's esophagus without dysplasia: Secondary | ICD-10-CM | POA: Diagnosis not present

## 2017-07-14 MED ORDER — SODIUM CHLORIDE 0.9 % IV SOLN
Freq: Once | INTRAVENOUS | Status: AC
  Administered 2017-07-14: 14:00:00 via INTRAVENOUS
  Filled 2017-07-14: qty 1000

## 2017-07-14 MED ORDER — SODIUM CHLORIDE 0.9 % IV SOLN
510.0000 mg | Freq: Once | INTRAVENOUS | Status: AC
  Administered 2017-07-14: 510 mg via INTRAVENOUS
  Filled 2017-07-14: qty 17

## 2017-07-16 DIAGNOSIS — Q2733 Arteriovenous malformation of digestive system vessel: Secondary | ICD-10-CM | POA: Diagnosis not present

## 2017-07-16 DIAGNOSIS — I10 Essential (primary) hypertension: Secondary | ICD-10-CM | POA: Diagnosis not present

## 2017-07-16 DIAGNOSIS — Z9049 Acquired absence of other specified parts of digestive tract: Secondary | ICD-10-CM | POA: Diagnosis not present

## 2017-07-16 DIAGNOSIS — F419 Anxiety disorder, unspecified: Secondary | ICD-10-CM | POA: Diagnosis not present

## 2017-07-16 DIAGNOSIS — K31819 Angiodysplasia of stomach and duodenum without bleeding: Secondary | ICD-10-CM | POA: Diagnosis not present

## 2017-07-16 DIAGNOSIS — E119 Type 2 diabetes mellitus without complications: Secondary | ICD-10-CM | POA: Diagnosis not present

## 2017-07-16 DIAGNOSIS — Z880 Allergy status to penicillin: Secondary | ICD-10-CM | POA: Diagnosis not present

## 2017-07-16 DIAGNOSIS — I2510 Atherosclerotic heart disease of native coronary artery without angina pectoris: Secondary | ICD-10-CM | POA: Diagnosis not present

## 2017-07-16 DIAGNOSIS — Z885 Allergy status to narcotic agent status: Secondary | ICD-10-CM | POA: Diagnosis not present

## 2017-07-16 DIAGNOSIS — K5521 Angiodysplasia of colon with hemorrhage: Secondary | ICD-10-CM | POA: Diagnosis not present

## 2017-07-20 ENCOUNTER — Encounter: Payer: Self-pay | Admitting: Physician Assistant

## 2017-07-21 ENCOUNTER — Inpatient Hospital Stay: Payer: BLUE CROSS/BLUE SHIELD | Attending: Oncology

## 2017-07-21 ENCOUNTER — Other Ambulatory Visit: Payer: Self-pay

## 2017-07-21 DIAGNOSIS — K558 Other vascular disorders of intestine: Secondary | ICD-10-CM | POA: Diagnosis not present

## 2017-07-21 DIAGNOSIS — D500 Iron deficiency anemia secondary to blood loss (chronic): Secondary | ICD-10-CM | POA: Insufficient documentation

## 2017-07-21 DIAGNOSIS — Z9989 Dependence on other enabling machines and devices: Secondary | ICD-10-CM | POA: Insufficient documentation

## 2017-07-21 DIAGNOSIS — I10 Essential (primary) hypertension: Secondary | ICD-10-CM | POA: Diagnosis not present

## 2017-07-21 DIAGNOSIS — E119 Type 2 diabetes mellitus without complications: Secondary | ICD-10-CM | POA: Diagnosis not present

## 2017-07-21 DIAGNOSIS — D473 Essential (hemorrhagic) thrombocythemia: Secondary | ICD-10-CM | POA: Insufficient documentation

## 2017-07-21 DIAGNOSIS — Z801 Family history of malignant neoplasm of trachea, bronchus and lung: Secondary | ICD-10-CM | POA: Insufficient documentation

## 2017-07-21 DIAGNOSIS — R195 Other fecal abnormalities: Secondary | ICD-10-CM | POA: Diagnosis not present

## 2017-07-21 DIAGNOSIS — K5790 Diverticulosis of intestine, part unspecified, without perforation or abscess without bleeding: Secondary | ICD-10-CM | POA: Insufficient documentation

## 2017-07-21 DIAGNOSIS — Z8042 Family history of malignant neoplasm of prostate: Secondary | ICD-10-CM | POA: Insufficient documentation

## 2017-07-21 DIAGNOSIS — Z79899 Other long term (current) drug therapy: Secondary | ICD-10-CM | POA: Diagnosis not present

## 2017-07-21 DIAGNOSIS — Z86010 Personal history of colonic polyps: Secondary | ICD-10-CM | POA: Insufficient documentation

## 2017-07-21 DIAGNOSIS — I2510 Atherosclerotic heart disease of native coronary artery without angina pectoris: Secondary | ICD-10-CM | POA: Insufficient documentation

## 2017-07-21 DIAGNOSIS — Z87891 Personal history of nicotine dependence: Secondary | ICD-10-CM | POA: Insufficient documentation

## 2017-07-21 DIAGNOSIS — Z7984 Long term (current) use of oral hypoglycemic drugs: Secondary | ICD-10-CM | POA: Diagnosis not present

## 2017-07-21 DIAGNOSIS — Z880 Allergy status to penicillin: Secondary | ICD-10-CM | POA: Diagnosis not present

## 2017-07-21 DIAGNOSIS — K219 Gastro-esophageal reflux disease without esophagitis: Secondary | ICD-10-CM | POA: Diagnosis not present

## 2017-07-21 DIAGNOSIS — G4733 Obstructive sleep apnea (adult) (pediatric): Secondary | ICD-10-CM | POA: Diagnosis not present

## 2017-07-21 DIAGNOSIS — K2270 Barrett's esophagus without dysplasia: Secondary | ICD-10-CM | POA: Diagnosis not present

## 2017-07-21 DIAGNOSIS — R5383 Other fatigue: Secondary | ICD-10-CM | POA: Insufficient documentation

## 2017-07-21 DIAGNOSIS — Z87442 Personal history of urinary calculi: Secondary | ICD-10-CM | POA: Insufficient documentation

## 2017-07-21 DIAGNOSIS — E785 Hyperlipidemia, unspecified: Secondary | ICD-10-CM | POA: Diagnosis not present

## 2017-07-21 LAB — FERRITIN: Ferritin: 469 ng/mL — ABNORMAL HIGH (ref 24–336)

## 2017-07-21 LAB — IRON AND TIBC
Iron: 54 ug/dL (ref 45–182)
Saturation Ratios: 20 % (ref 17.9–39.5)
TIBC: 268 ug/dL (ref 250–450)
UIBC: 214 ug/dL

## 2017-07-21 LAB — CBC WITH DIFFERENTIAL/PLATELET
BASOS ABS: 0.1 10*3/uL (ref 0–0.1)
Basophils Relative: 1 %
EOS ABS: 1 10*3/uL — AB (ref 0–0.7)
EOS PCT: 12 %
HCT: 46.3 % (ref 40.0–52.0)
Hemoglobin: 15.1 g/dL (ref 13.0–18.0)
LYMPHS ABS: 1.6 10*3/uL (ref 1.0–3.6)
LYMPHS PCT: 19 %
MCH: 26.6 pg (ref 26.0–34.0)
MCHC: 32.7 g/dL (ref 32.0–36.0)
MCV: 81.4 fL (ref 80.0–100.0)
MONO ABS: 0.6 10*3/uL (ref 0.2–1.0)
Monocytes Relative: 7 %
Neutro Abs: 5.2 10*3/uL (ref 1.4–6.5)
Neutrophils Relative %: 61 %
PLATELETS: 227 10*3/uL (ref 150–440)
RBC: 5.68 MIL/uL (ref 4.40–5.90)
RDW: 23.2 % — AB (ref 11.5–14.5)
WBC: 8.6 10*3/uL (ref 3.8–10.6)

## 2017-07-21 LAB — URINALYSIS, COMPLETE (UACMP) WITH MICROSCOPIC
Bilirubin Urine: NEGATIVE
Hgb urine dipstick: NEGATIVE
Ketones, ur: 5 mg/dL — AB
Leukocytes, UA: NEGATIVE
Nitrite: NEGATIVE
PH: 5 (ref 5.0–8.0)
Protein, ur: NEGATIVE mg/dL
SPECIFIC GRAVITY, URINE: 1.026 (ref 1.005–1.030)
Squamous Epithelial / LPF: NONE SEEN

## 2017-07-22 ENCOUNTER — Inpatient Hospital Stay (HOSPITAL_BASED_OUTPATIENT_CLINIC_OR_DEPARTMENT_OTHER): Payer: BLUE CROSS/BLUE SHIELD | Admitting: Oncology

## 2017-07-22 ENCOUNTER — Encounter: Payer: Self-pay | Admitting: Oncology

## 2017-07-22 VITALS — BP 152/83 | HR 80 | Temp 97.7°F | Resp 16 | Wt 174.0 lb

## 2017-07-22 DIAGNOSIS — E119 Type 2 diabetes mellitus without complications: Secondary | ICD-10-CM

## 2017-07-22 DIAGNOSIS — D500 Iron deficiency anemia secondary to blood loss (chronic): Secondary | ICD-10-CM

## 2017-07-22 DIAGNOSIS — K2270 Barrett's esophagus without dysplasia: Secondary | ICD-10-CM

## 2017-07-22 DIAGNOSIS — K5790 Diverticulosis of intestine, part unspecified, without perforation or abscess without bleeding: Secondary | ICD-10-CM

## 2017-07-22 DIAGNOSIS — Z7984 Long term (current) use of oral hypoglycemic drugs: Secondary | ICD-10-CM

## 2017-07-22 DIAGNOSIS — I2510 Atherosclerotic heart disease of native coronary artery without angina pectoris: Secondary | ICD-10-CM

## 2017-07-22 DIAGNOSIS — Z79899 Other long term (current) drug therapy: Secondary | ICD-10-CM

## 2017-07-22 DIAGNOSIS — R5383 Other fatigue: Secondary | ICD-10-CM | POA: Diagnosis not present

## 2017-07-22 DIAGNOSIS — R195 Other fecal abnormalities: Secondary | ICD-10-CM

## 2017-07-22 DIAGNOSIS — G4733 Obstructive sleep apnea (adult) (pediatric): Secondary | ICD-10-CM | POA: Diagnosis not present

## 2017-07-22 DIAGNOSIS — E785 Hyperlipidemia, unspecified: Secondary | ICD-10-CM | POA: Diagnosis not present

## 2017-07-22 DIAGNOSIS — K219 Gastro-esophageal reflux disease without esophagitis: Secondary | ICD-10-CM

## 2017-07-22 DIAGNOSIS — I10 Essential (primary) hypertension: Secondary | ICD-10-CM | POA: Diagnosis not present

## 2017-07-22 DIAGNOSIS — K5521 Angiodysplasia of colon with hemorrhage: Secondary | ICD-10-CM

## 2017-07-22 DIAGNOSIS — Z86010 Personal history of colonic polyps: Secondary | ICD-10-CM

## 2017-07-22 DIAGNOSIS — K558 Other vascular disorders of intestine: Secondary | ICD-10-CM

## 2017-07-22 DIAGNOSIS — D473 Essential (hemorrhagic) thrombocythemia: Secondary | ICD-10-CM

## 2017-07-22 DIAGNOSIS — Z9989 Dependence on other enabling machines and devices: Secondary | ICD-10-CM | POA: Diagnosis not present

## 2017-07-22 DIAGNOSIS — Z87442 Personal history of urinary calculi: Secondary | ICD-10-CM

## 2017-07-22 DIAGNOSIS — Z87891 Personal history of nicotine dependence: Secondary | ICD-10-CM

## 2017-07-22 DIAGNOSIS — Z880 Allergy status to penicillin: Secondary | ICD-10-CM

## 2017-07-22 NOTE — Progress Notes (Signed)
Patient is here today for follow up with lab results. He continues to have fatigue and is frustrated with the situation. He denies having any pain at this time.

## 2017-07-22 NOTE — Progress Notes (Signed)
. [facility] Follow Up Note  Patient Care Team: Rubye Beach as PCP - General (Family Medicine) Terance Ice, MD as Consulting Physician (Cardiology) [name], MD as Consulting Physician (Family Medicine) Christene Lye, MD (General Surgery) Cristy Friedlander, MD (Oral Surgery)  REASON FOR VISIT Follow up for iron deficiency anemia  PERTINENT HEMATOLOGY HISTORY Joseph Terry 56 y.o. male with PMH listed as below is referred by primary care [facility] to me for evaluation and management of iron deficiency anemia. Patient reports feeling fatigued lately and he was seen by primary care March 30, 2017. His lab tests indicated anemia, thrombocytosis and iron deficiency.  He also had positive occult stool test. Per Epic records, he has had colonoscopy in 2009 and later 2015, with findings of multiple polyps and diverticulosis.   Denies weight loss, fever or chills, chest pain, SOB or abdominal pain. Denies having bright red blood in stool or black stool.  Primary is working on referring him to see GI or Dr.Sankar to get further work up.   Treatment: s/p IV Feraheme 510mg  x 2 doses. Iron store improved.  He has EGD and Colonoscopy during the interval by Dr.Wohl. EGD biopsy showed changes consistent with Barrett's esophagus. Colonoscopy showed polyps which were hyperplastic. Negative for malignancy.  However, despite no bleeding sites found on EGD and Colonoscopy, ferritin dropped again.  He got additional 2 doses of IV Feraheme  INTERVAL HISTORY 56yo male with above history presents to follow up on iron deficiency anemia. He has chronic GERD and takes PPI. Had capsule endoscopy and was found to have multiple small bowel AVMs. Patient had small bowel endoscopy at [facility] and had APC of AVMs.  Patient reports energy level has not fully recovered to his baseline yet. Fatigue slightly better.    Review of Systems   Constitutional: Negative.  Negative for chills and fever.  HENT:  Negative.  Negative for lump/mass.   Eyes: Negative.  Negative for eye problems.  Respiratory: Negative.  Negative for chest tightness.   Cardiovascular: Negative.  Negative for chest pain.  Gastrointestinal: Negative for abdominal distention.       Acid reflux.   Endocrine: Negative.  Negative for hot flashes.  Genitourinary: Negative.  Negative for difficulty urinating.   Musculoskeletal: Negative.  Negative for arthralgias.  Skin: Negative.   Neurological: Negative.  Negative for dizziness.  Hematological: Negative.  Negative for adenopathy.  Psychiatric/Behavioral: Negative.  The patient is not nervous/anxious.     MEDICAL HISTORY: Past Medical History:  Diagnosis Date  . Anxiety state, unspecified   . Barrett's esophagus   . CAD (coronary artery disease), severe multivessel CAD surgical consult for CABG    a. 12/2011 Cath: 3 vessel CAD with normal EF;  b. 12/2011 CABG x 5: LIMA to LAD, SVG to distal RCA with sequential SVG to PDA, SVG to OM, and SVG to D2;  c. 03/2014 ETT: Ex time 6:21, 48mm horizontal ST dep in V3-V6, HTN response.  . Depression   . Diaphragmatic hernia without mention of obstruction or gangrene   . Diverticulosis   . GERD (gastroesophageal reflux disease)   . Hyperlipidemia   . Hypertension   . Impotence of organic origin   . Internal hemorrhoids without mention of complication   . Iron deficiency anemia due to chronic blood loss 04/15/2017  . Kidney stone   . Motion sickness    cars, boats  . OSA on CPAP  a. Sleep study 07/2013 +sever OSA; CPAP at 10cm recommended.  . Personal history of colonic polyps   . Type II diabetes mellitus ([place])   . Wears contact lenses     SURGICAL HISTORY: Past Surgical History:  Procedure Laterality Date  . CARDIAC CATHETERIZATION  4 /19/ 2013   [place]  11/03/13  . COLONOSCOPY  11/01/13   multiple polyps; diverticulosis.  Sankar. Repeat 5 years.  . COLONOSCOPY W/ POLYPECTOMY  09/16/2007   colon polyps.  Repeat 5 years. Iftikhar.  . POLYSOMNOGRAPHY  07/2013   +severe OSA; CPAP at 10cm pressure recommended.  . rotator cuff curgery  2011   lt rotator cuff  . Shoulder surgery  1997   bilateral  . WISDOM TOOTH EXTRACTION      SOCIAL HISTORY: Social History   Socioeconomic History  . Marital status: Divorced    Spouse name: Not on file  . Number of children: 1  . Years of education: 32  . Highest education level: Not on file  Social Needs  . Financial resource strain: Not on file  . Food insecurity - worry: Not on file  . Food insecurity - inability: Not on file  . Transportation needs - medical: Not on file  . Transportation needs - non-medical: Not on file  Occupational History  . Occupation: Printmaker: [employer]    Comment: x 30 yrs  Tobacco Use  . Smoking status: Former Smoker    Packs/day: 1.50    Years: 35.00    Pack years: 52.50    Types: Cigarettes    Last attempt to quit: 11/14/2011    Years since quitting: 5.6  . Smokeless tobacco: Never Used  . Tobacco comment: quit 2 weeks prior to CABG 12/2011  Substance and Sexual Activity  . Alcohol use: Yes    Alcohol/week: 0.6 oz    Types: 1 Cans of beer per week    Comment: Drinks once per month; beer.  . Drug use: No  . Sexual activity: Yes    Partners: Female    Birth control/protection: Condom  Other Topics Concern  . Not on file  Social History Narrative   Marital status: divorced since 1996; dating casually in 2016.        Lives: Lives alone.          Children:  One son and a granddaughter 88 years old live in [place].       Tobacco: quit with CABG      Alcohol: weekends; 6-8 beers on average.  Beers with football; ten on football weekends.      Exercise: Light, yard work.        Employment:  Works 45-50 hrs. Some weeks.  [employer].  Desk work since 05/2013. Since 1979.      Guns:  Guns in the home not  stored in locked cabinet. Smoke alarm in home, Always wears seatbelts.       Sexual History: active;Last HIV testing 2015; has has 25+ partners; females only.             FAMILY HISTORY Family History  Problem Relation Age of Onset  . Heart attack Mother        defribillator in place  . Hypertension Mother   . Hyperlipidemia Mother   . Heart disease Mother        CHF with defibrillator;  CAD.  . Diabetes Mother   . Cancer Mother 45  Lung cancer  . Hypertension Father   . Hyperlipidemia Father   . Cancer Father 68        prostate cancer s/p  implants  . COPD Father   . Hypertension Sister   . Heart disease Maternal Grandmother   . Heart disease Maternal Grandfather   . Cancer Paternal Grandmother   . Cancer Paternal Grandfather   . Hypertension Sister     ALLERGIES:  is allergic to penicillins and tramadol.  MEDICATIONS:  Current Outpatient Medications  Medication Sig Dispense Refill  . atorvastatin (LIPITOR) 40 MG tablet TAKE 1 TABLET(40 MG) BY MOUTH DAILY AT 6 PM 90 tablet 1  . clonazePAM (KLONOPIN) 1 MG tablet TAKE 1 TABLET BY MOUTH TWICE DAILY AS NEEDED 60 tablet 5  . dapagliflozin propanediol (FARXIGA) 10 MG TABS tablet Take 10 mg by mouth daily. 90 tablet 1  . glipiZIDE (GLUCOTROL XL) 10 MG 24 hr tablet Take 1 tablet (10 mg total) by mouth daily with breakfast. 90 tablet 1  . metFORMIN (GLUCOPHAGE) 1000 MG tablet TAKE 1 TABLET (1,000 MG TOTAL) BY MOUTH 2 (TWO) TIMES DAILY WITH A MEAL. 180 tablet 1  . metoprolol tartrate (LOPRESSOR) 25 MG tablet TAKE 1 TABLET(25 MG) BY MOUTH TWICE DAILY 180 tablet 1  . omeprazole (PRILOSEC) 40 MG capsule Take 40 mg by mouth daily.    . sitaGLIPtin (JANUVIA) 100 MG tablet TAKE 1 TABLET (100 MG TOTAL) BY MOUTH DAILY. 90 tablet 1  . venlafaxine XR (EFFEXOR-XR) 150 MG 24 hr capsule Take 1 capsule (150 mg total) by mouth daily with breakfast. 90 capsule 1   No current facility-administered medications for this visit.     PHYSICAL  EXAMINATION:  ECOG PERFORMANCE STATUS: 0 - Asymptomatic   Vitals:   07/22/17 1425  BP: (!) 152/83  Pulse: 80  Resp: 16  Temp: 97.7 F (36.5 C)    Filed Weights   07/22/17 1425  Weight: 174 lb (78.9 kg)   Physical Exam  GENERAL: No distress, well nourished.  SKIN:  No rashes or significant lesions  HEAD: Normocephalic, No masses, lesions, tenderness or abnormalities  EYES: Conjunctiva are pink, non icteric ENT: External ears normal ,lips , buccal mucosa, and tongue normal and mucous membranes are moist  LYMPH: No palpable cervical and axillary lymphadenopathy  LUNGS: Clear to auscultation, no crackles or wheezes HEART: Regular rate & rhythm, no murmurs, no gallops, S1 normal and S2 normal  ABDOMEN: Abdomen soft, non-tender, normal bowel sounds, I did not appreciate any  masses or organomegaly  MUSCULOSKELETAL: No CVA tenderness and no tenderness on percussion of the back or rib cage.  EXTREMITIES: No edema, no skin discoloration or tenderness NEURO: Alert & oriented, no focal motor/sensory deficits.  LABORATORY DATA: I have personally reviewed the data as listed: CBC    Component Value Date/Time   WBC 8.6 07/21/2017 1010   RBC 5.68 07/21/2017 1010   HGB 15.1 07/21/2017 1010   HGB 9.6 (L) 03/30/2017 0912   HCT 46.3 07/21/2017 1010   HCT 33.5 (L) 03/30/2017 0912   PLT 227 07/21/2017 1010   PLT 415 (H) 03/30/2017 0912   MCV 81.4 07/21/2017 1010   MCV 64 (L) 03/30/2017 0912   MCH 26.6 07/21/2017 1010   MCHC 32.7 07/21/2017 1010   RDW 23.2 (H) 07/21/2017 1010   RDW 19.1 (H) 03/30/2017 0912   LYMPHSABS 1.6 07/21/2017 1010   LYMPHSABS 1.9 03/30/2017 0912   MONOABS 0.6 07/21/2017 1010   EOSABS 1.0 (H) 07/21/2017 1010  EOSABS 1.2 (H) 03/30/2017 0912   BASOSABS 0.1 07/21/2017 1010   BASOSABS 0.1 03/30/2017 0912   RADIOGRAPHIC STUDIES: I have personally reviewed the radiological images as listed and agree with the findings in the report No recent images.    ASSESSMENT/PLAN 1. Iron deficiency anemia due to chronic blood loss   2. AVM (arteriovenous malformation) of small bowel, acquired with hemorrhage ([place])   . 1  S/p IV feraheme x 4 doses. Iron store improved. He is s/p APC of his small bowel AVMS, and anticipate that his chronic blood loss has stopped. No hematuria on UA. Glucosuria detected. Patient admits not following diabetic diet and he was advised to follow up with primary provider for tighter control of his diabetes.  He can repeat CBC, iron tibc and ferritin in 6 months for re-evaluation.   2 Barrett;s esophagous: Follow up with Dr.Wohl  Follow in 6 months to recheck labs. CBC iron TIBC and ferritin All questions were answered. The patient knows to call the clinic with any problems, questions or concerns. Thank you for this kind referral and the opportunity to participate in the care of this patient. A copy of today's note is routed to referring physician Fenton Malling.   Dr. Earlie Server, MD, PhD [facility] Pager- [phone] 07/22/2017

## 2017-07-23 ENCOUNTER — Other Ambulatory Visit: Payer: Self-pay | Admitting: Physician Assistant

## 2017-07-23 DIAGNOSIS — E119 Type 2 diabetes mellitus without complications: Secondary | ICD-10-CM

## 2017-08-15 ENCOUNTER — Other Ambulatory Visit: Payer: Self-pay | Admitting: Physician Assistant

## 2017-08-15 DIAGNOSIS — E119 Type 2 diabetes mellitus without complications: Secondary | ICD-10-CM

## 2017-09-04 ENCOUNTER — Other Ambulatory Visit: Payer: BLUE CROSS/BLUE SHIELD

## 2017-09-04 ENCOUNTER — Ambulatory Visit: Payer: BLUE CROSS/BLUE SHIELD | Admitting: Oncology

## 2017-09-11 ENCOUNTER — Ambulatory Visit: Admit: 2017-09-11 | Payer: BLUE CROSS/BLUE SHIELD | Admitting: Gastroenterology

## 2017-09-11 SURGERY — Surgical Case
Anesthesia: *Unknown

## 2017-09-11 SURGERY — ESOPHAGOGASTRODUODENOSCOPY (EGD) WITH PROPOFOL
Anesthesia: General

## 2017-09-21 DIAGNOSIS — M5441 Lumbago with sciatica, right side: Secondary | ICD-10-CM | POA: Diagnosis not present

## 2017-09-21 DIAGNOSIS — M9903 Segmental and somatic dysfunction of lumbar region: Secondary | ICD-10-CM | POA: Diagnosis not present

## 2017-09-21 DIAGNOSIS — M9901 Segmental and somatic dysfunction of cervical region: Secondary | ICD-10-CM | POA: Diagnosis not present

## 2017-09-21 DIAGNOSIS — M5033 Other cervical disc degeneration, cervicothoracic region: Secondary | ICD-10-CM | POA: Diagnosis not present

## 2017-09-27 ENCOUNTER — Other Ambulatory Visit: Payer: Self-pay | Admitting: Physician Assistant

## 2017-09-27 DIAGNOSIS — F418 Other specified anxiety disorders: Secondary | ICD-10-CM

## 2017-10-01 ENCOUNTER — Encounter: Payer: Self-pay | Admitting: Physician Assistant

## 2017-10-01 ENCOUNTER — Ambulatory Visit: Payer: BLUE CROSS/BLUE SHIELD | Admitting: Physician Assistant

## 2017-10-01 VITALS — BP 140/70 | HR 60 | Temp 97.9°F | Resp 16 | Wt 176.4 lb

## 2017-10-01 DIAGNOSIS — E119 Type 2 diabetes mellitus without complications: Secondary | ICD-10-CM

## 2017-10-01 LAB — POCT GLYCOSYLATED HEMOGLOBIN (HGB A1C)
ESTIMATED AVERAGE GLUCOSE: 151
Hemoglobin A1C: 6.9

## 2017-10-01 NOTE — Progress Notes (Signed)
Patient: ANTOINE Terry Male    DOB: Jan 21, 1961   57 y.o.   MRN: [id number] Visit Date: 10/01/2017  Today's Provider: Mar Daring, PA-C   Chief Complaint  Patient presents with  . Follow-up    T2DM,HTN,Hypercholesterolemia   Subjective:    HPI  Diabetes Mellitus Type II, Follow-up:   Lab Results  Component Value Date   HGBA1C 6.9 10/01/2017   HGBA1C 6.6 03/30/2017   HGBA1C 7.2 12/26/2016    Last seen for diabetes 6 months ago.  Management since then includes Continue Invokana 300mg  daily,Metformin 1000 mg BID,Glipizide 10mg  daily, and Januvia 100 mg daily. Per patient his Invokana change to [place] due to high potassium. He reports excellent compliance with treatment. He is not having side effects.  Current symptoms include none and have been stable. Home blood sugar records: not checking  Episodes of hypoglycemia? no   Current Insulin Regimen: Most Recent Eye Exam: 10/2016 Weight trend: stable Current diet: in general, a "healthy" diet   Current exercise: none  Pertinent Labs:    Component Value Date/Time   CHOL 131 03/30/2017 0912   TRIG 187 (H) 03/30/2017 0912   HDL 39 (L) 03/30/2017 0912   LDLCALC 55 03/30/2017 0912   CREATININE 0.72 05/06/2017 1306   CREATININE 0.76 06/18/2015 0821    Hypertension, follow-up:  BP Readings from Last 3 Encounters:  10/01/17 140/70  07/22/17 (!) 152/83  07/14/17 130/76    He was last seen for hypertension 6 months ago.  BP at that visit was 106/60. Management since that visit includes none. He reports excellent compliance with treatment. He is not having side effects.  He is adherent to low salt diet.   Outside blood pressures are n/a. He is experiencing none.  Patient denies chest pain, chest pressure/discomfort, exertional chest pressure/discomfort, fatigue, irregular heart beat, lower extremity edema, near-syncope and palpitations.   Cardiovascular risk factors include advanced age (older than  68 for men, 70 for women), diabetes mellitus, dyslipidemia, hypertension and male gender.   Weight trend: stable Wt Readings from Last 3 Encounters:  10/01/17 176 lb 6.4 oz (80 kg)  07/22/17 174 lb (78.9 kg)  06/22/17 173 lb (78.5 kg)    Lipid/Cholesterol, Follow-up:   Last seen for this 6 months ago.  Management changes since that visit include none. . Last Lipid Panel:    Component Value Date/Time   CHOL 131 03/30/2017 0912   TRIG 187 (H) 03/30/2017 0912   HDL 39 (L) 03/30/2017 0912   CHOLHDL 3.4 03/30/2017 0912   CHOLHDL 4.7 08/14/2014 0905   VLDL NOT CALC 08/14/2014 0905   LDLCALC 55 03/30/2017 0912    Risk factors for vascular disease include diabetes mellitus, hypercholesterolemia and hypertension  He reports excellent compliance with treatment. He is not having side effects.  Current symptoms include none and have been stable.  -------------------------------------------------------------------    Allergies  Allergen Reactions  . Penicillins Itching    tingling  . Tramadol Rash     Current Outpatient Medications:  .  atorvastatin (LIPITOR) 40 MG tablet, TAKE 1 TABLET(40 MG) BY MOUTH DAILY AT 6 PM, Disp: 90 tablet, Rfl: 1 .  clonazePAM (KLONOPIN) 1 MG tablet, TAKE 1 TABLET BY MOUTH TWICE DAILY AS NEEDED, Disp: 60 tablet, Rfl: 5 .  dapagliflozin propanediol (FARXIGA) 10 MG TABS tablet, Take 10 mg by mouth daily., Disp: 90 tablet, Rfl: 1 .  glipiZIDE (GLUCOTROL XL) 10 MG 24 hr tablet, TAKE 1 TABLET(10 MG)  BY MOUTH DAILY WITH BREAKFAST, Disp: 90 tablet, Rfl: 1 .  metFORMIN (GLUCOPHAGE) 1000 MG tablet, TAKE 1 TABLET(1000 MG) BY MOUTH TWICE DAILY WITH A MEAL, Disp: 180 tablet, Rfl: 1 .  metoprolol tartrate (LOPRESSOR) 25 MG tablet, TAKE 1 TABLET(25 MG) BY MOUTH TWICE DAILY, Disp: 180 tablet, Rfl: 1 .  omeprazole (PRILOSEC) 40 MG capsule, Take 40 mg by mouth daily., Disp: , Rfl:  .  sitaGLIPtin (JANUVIA) 100 MG tablet, TAKE 1 TABLET (100 MG TOTAL) BY MOUTH DAILY.,  Disp: 90 tablet, Rfl: 1 .  venlafaxine XR (EFFEXOR-XR) 150 MG 24 hr capsule, TAKE 1 CAPSULE(150 MG) BY MOUTH DAILY WITH BREAKFAST, Disp: 90 capsule, Rfl: 1  Review of Systems  Constitutional: Negative for fatigue.  Eyes: Negative for visual disturbance.  Cardiovascular: Negative for chest pain, palpitations and leg swelling.  Endocrine: Negative for polydipsia and polyuria.  Neurological: Negative for dizziness, light-headedness and headaches.    Social History   Tobacco Use  . Smoking status: Former Smoker    Packs/day: 1.50    Years: 35.00    Pack years: 52.50    Types: Cigarettes    Last attempt to quit: 11/14/2011    Years since quitting: 5.8  . Smokeless tobacco: Never Used  . Tobacco comment: quit 2 weeks prior to CABG 12/2011  Substance Use Topics  . Alcohol use: Yes    Alcohol/week: 0.6 oz    Types: 1 Cans of beer per week    Comment: Drinks once per month; beer.   Objective:   BP 140/70 (BP Location: Left Arm, Patient Position: Sitting, Cuff Size: Normal)   Pulse 60   Temp 97.9 F (36.6 C) (Oral)   Resp 16   Wt 176 lb 6.4 oz (80 kg)   SpO2 97%   BMI 25.31 kg/m    Physical Exam  Constitutional: He appears well-developed and well-nourished. No distress.  HENT:  Head: Normocephalic and atraumatic.  Neck: Normal range of motion. Neck supple. No JVD present. No tracheal deviation present. No thyromegaly present.  Cardiovascular: Normal rate, regular rhythm and normal heart sounds. Exam reveals no gallop and no friction rub.  No murmur heard. Pulmonary/Chest: Effort normal and breath sounds normal. No respiratory distress. He has no wheezes. He has no rales.  Lymphadenopathy:    He has no cervical adenopathy.  Skin: He is not diaphoretic.  Vitals reviewed.       Assessment & Plan:     1. Type 2 diabetes mellitus without complication, without long-term current use of insulin (HCC) A1c up slightly to 6.9 from 6.6. Will continue metformin, glipizide, januvia,  and farxiga as directed. I will see him back in 6 months for CPE. He is to call if any acute issues in the meantime.  - POCT glycosylated hemoglobin (Hb A1C)       Mar Daring, PA-C  [facility]

## 2017-10-01 NOTE — Patient Instructions (Signed)
Diabetes Mellitus and Nutrition When you have diabetes (diabetes mellitus), it is very important to have healthy eating habits because your blood sugar (glucose) levels are greatly affected by what you eat and drink. Eating healthy foods in the appropriate amounts, at about the same times every day, can help you:  Control your blood glucose.  Lower your risk of heart disease.  Improve your blood pressure.  Reach or maintain a healthy weight.  Every person with diabetes is different, and each person has different needs for a meal plan. Your health care provider may recommend that you work with a diet and nutrition specialist (dietitian) to make a meal plan that is best for you. Your meal plan may vary depending on factors such as:  The calories you need.  The medicines you take.  Your weight.  Your blood glucose, blood pressure, and cholesterol levels.  Your activity level.  Other health conditions you have, such as heart or kidney disease.  How do carbohydrates affect me? Carbohydrates affect your blood glucose level more than any other type of food. Eating carbohydrates naturally increases the amount of glucose in your blood. Carbohydrate counting is a method for keeping track of how many carbohydrates you eat. Counting carbohydrates is important to keep your blood glucose at a healthy level, especially if you use insulin or take certain oral diabetes medicines. It is important to know how many carbohydrates you can safely have in each meal. This is different for every person. Your dietitian can help you calculate how many carbohydrates you should have at each meal and for snack. Foods that contain carbohydrates include:  Bread, cereal, rice, pasta, and crackers.  Potatoes and corn.  Peas, beans, and lentils.  Milk and yogurt.  Fruit and juice.  Desserts, such as cakes, cookies, ice cream, and candy.  How does alcohol affect me? Alcohol can cause a sudden decrease in blood  glucose (hypoglycemia), especially if you use insulin or take certain oral diabetes medicines. Hypoglycemia can be a life-threatening condition. Symptoms of hypoglycemia (sleepiness, dizziness, and confusion) are similar to symptoms of having too much alcohol. If your health care provider says that alcohol is safe for you, follow these guidelines:  Limit alcohol intake to no more than 1 drink per day for nonpregnant women and 2 drinks per day for men. One drink equals 12 oz of beer, 5 oz of wine, or 1 oz of hard liquor.  Do not drink on an empty stomach.  Keep yourself hydrated with water, diet soda, or unsweetened iced tea.  Keep in mind that regular soda, juice, and other mixers may contain a lot of sugar and must be counted as carbohydrates.  What are tips for following this plan? Reading food labels  Start by checking the serving size on the label. The amount of calories, carbohydrates, fats, and other nutrients listed on the label are based on one serving of the food. Many foods contain more than one serving per package.  Check the total grams (g) of carbohydrates in one serving. You can calculate the number of servings of carbohydrates in one serving by dividing the total carbohydrates by 15. For example, if a food has 30 g of total carbohydrates, it would be equal to 2 servings of carbohydrates.  Check the number of grams (g) of saturated and trans fats in one serving. Choose foods that have low or no amount of these fats.  Check the number of milligrams (mg) of sodium in one serving. Most people   should limit total sodium intake to less than 2,300 mg per day.  Always check the nutrition information of foods labeled as "low-fat" or "nonfat". These foods may be higher in added sugar or refined carbohydrates and should be avoided.  Talk to your dietitian to identify your daily goals for nutrients listed on the label. Shopping  Avoid buying canned, premade, or processed foods. These  foods tend to be high in fat, sodium, and added sugar.  Shop around the outside edge of the grocery store. This includes fresh fruits and vegetables, bulk grains, fresh meats, and fresh dairy. Cooking  Use low-heat cooking methods, such as baking, instead of high-heat cooking methods like deep frying.  Cook using healthy oils, such as olive, canola, or sunflower oil.  Avoid cooking with butter, cream, or high-fat meats. Meal planning  Eat meals and snacks regularly, preferably at the same times every day. Avoid going long periods of time without eating.  Eat foods high in fiber, such as fresh fruits, vegetables, beans, and whole grains. Talk to your dietitian about how many servings of carbohydrates you can eat at each meal.  Eat 4-6 ounces of lean protein each day, such as lean meat, chicken, fish, eggs, or tofu. 1 ounce is equal to 1 ounce of meat, chicken, or fish, 1 egg, or 1/4 cup of tofu.  Eat some foods each day that contain healthy fats, such as avocado, nuts, seeds, and fish. Lifestyle   Check your blood glucose regularly.  Exercise at least 30 minutes 5 or more days each week, or as told by your health care provider.  Take medicines as told by your health care provider.  Do not use any products that contain nicotine or tobacco, such as cigarettes and e-cigarettes. If you need help quitting, ask your health care provider.  Work with a counselor or diabetes educator to identify strategies to manage stress and any emotional and social challenges. What are some questions to ask my health care provider?  Do I need to meet with a diabetes educator?  Do I need to meet with a dietitian?  What number can I call if I have questions?  When are the best times to check my blood glucose? Where to find more information:  American Diabetes Association: diabetes.org/food-and-fitness/food  Academy of Nutrition and Dietetics:  www.eatright.org/resources/health/diseases-and-conditions/diabetes  National Institute of Diabetes and Digestive and Kidney Diseases (NIH): www.niddk.nih.gov/health-information/diabetes/overview/diet-eating-physical-activity Summary  A healthy meal plan will help you control your blood glucose and maintain a healthy lifestyle.  Working with a diet and nutrition specialist (dietitian) can help you make a meal plan that is best for you.  Keep in mind that carbohydrates and alcohol have immediate effects on your blood glucose levels. It is important to count carbohydrates and to use alcohol carefully. This information is not intended to replace advice given to you by your health care provider. Make sure you discuss any questions you have with your health care provider. Document Released: 05/29/2005 Document Revised: 10/06/2016 Document Reviewed: 10/06/2016 Elsevier Interactive Patient Education  2018 Elsevier Inc.  

## 2017-10-11 ENCOUNTER — Other Ambulatory Visit: Payer: Self-pay | Admitting: Physician Assistant

## 2017-10-11 DIAGNOSIS — E119 Type 2 diabetes mellitus without complications: Secondary | ICD-10-CM

## 2017-10-12 NOTE — Telephone Encounter (Signed)
Pharmacy requesting refills. Thanks!  

## 2017-11-12 ENCOUNTER — Other Ambulatory Visit: Payer: Self-pay | Admitting: Physician Assistant

## 2017-11-12 DIAGNOSIS — F418 Other specified anxiety disorders: Secondary | ICD-10-CM

## 2017-11-13 NOTE — Telephone Encounter (Signed)
LM for the pharmacy with prescription details.  Thanks,  -

## 2017-11-17 ENCOUNTER — Encounter: Payer: Self-pay | Admitting: Physician Assistant

## 2017-11-17 ENCOUNTER — Ambulatory Visit (INDEPENDENT_AMBULATORY_CARE_PROVIDER_SITE_OTHER): Payer: BLUE CROSS/BLUE SHIELD | Admitting: Physician Assistant

## 2017-11-17 VITALS — BP 118/70 | HR 68 | Temp 97.7°F | Resp 16 | Ht 70.0 in | Wt 177.0 lb

## 2017-11-17 DIAGNOSIS — J0140 Acute pansinusitis, unspecified: Secondary | ICD-10-CM | POA: Diagnosis not present

## 2017-11-17 DIAGNOSIS — J40 Bronchitis, not specified as acute or chronic: Secondary | ICD-10-CM

## 2017-11-17 MED ORDER — AZITHROMYCIN 250 MG PO TABS: ORAL_TABLET | ORAL | 0 refills | Status: DC

## 2017-11-17 MED ORDER — ALBUTEROL SULFATE HFA 108 (90 BASE) MCG/ACT IN AERS: 2.0000 | INHALATION_SPRAY | Freq: Four times a day (QID) | RESPIRATORY_TRACT | 0 refills | Status: DC | PRN

## 2017-11-17 NOTE — Progress Notes (Signed)
Patient: Joseph Terry Male    DOB: 01-Sep-1961   57 y.o.   MRN: [id number] Visit Date: 11/17/2017  Today's Provider: Mar Daring, PA-C   Chief Complaint  Patient presents with  . Fatigue   Subjective:    HPI Patient comes in today c/o fatigue that has worsened over the last 2 days. He reports that he woke up 2 days ago with a lot of head congestion, and a "foggy" sensation in his head. He reports that later on that day, he developed nasal congestion and PND. He denies any fever and chills. He reports, "I just feel bad."     Allergies  Allergen Reactions  . Penicillins Itching    tingling  . Tramadol Rash     Current Outpatient Medications:  .  atorvastatin (LIPITOR) 40 MG tablet, TAKE 1 TABLET(40 MG) BY MOUTH DAILY AT 6 PM, Disp: 90 tablet, Rfl: 1 .  clonazePAM (KLONOPIN) 1 MG tablet, TAKE 1 TABLET BY MOUTH TWICE DAILY AS NEEDED, Disp: 60 tablet, Rfl: 5 .  FARXIGA 10 MG TABS tablet, TAKE 1 TABLET BY MOUTH DAILY, Disp: 90 tablet, Rfl: 1 .  glipiZIDE (GLUCOTROL XL) 10 MG 24 hr tablet, TAKE 1 TABLET(10 MG) BY MOUTH DAILY WITH BREAKFAST, Disp: 90 tablet, Rfl: 1 .  metFORMIN (GLUCOPHAGE) 1000 MG tablet, TAKE 1 TABLET(1000 MG) BY MOUTH TWICE DAILY WITH A MEAL, Disp: 180 tablet, Rfl: 1 .  metoprolol tartrate (LOPRESSOR) 25 MG tablet, TAKE 1 TABLET(25 MG) BY MOUTH TWICE DAILY, Disp: 180 tablet, Rfl: 1 .  omeprazole (PRILOSEC) 40 MG capsule, Take 40 mg by mouth daily., Disp: , Rfl:  .  sitaGLIPtin (JANUVIA) 100 MG tablet, TAKE 1 TABLET (100 MG TOTAL) BY MOUTH DAILY., Disp: 90 tablet, Rfl: 1 .  venlafaxine XR (EFFEXOR-XR) 150 MG 24 hr capsule, TAKE 1 CAPSULE(150 MG) BY MOUTH DAILY WITH BREAKFAST, Disp: 90 capsule, Rfl: 1  Review of Systems  Constitutional: Positive for activity change and fatigue. Negative for appetite change, chills and fever.  HENT: Positive for congestion, postnasal drip, rhinorrhea and sinus pressure.   Respiratory: Negative for cough, shortness of  breath and wheezing.   Cardiovascular: Negative for chest pain, palpitations and leg swelling.  Musculoskeletal: Negative for arthralgias and back pain.  Allergic/Immunologic: Negative for environmental allergies, food allergies and immunocompromised state.  Psychiatric/Behavioral: Negative for agitation, confusion, decreased concentration and hallucinations. The patient is not nervous/anxious and is not hyperactive.     Social History   Tobacco Use  . Smoking status: Former Smoker    Packs/day: 1.50    Years: 35.00    Pack years: 52.50    Types: Cigarettes    Last attempt to quit: 11/14/2011    Years since quitting: 6.0  . Smokeless tobacco: Never Used  . Tobacco comment: quit 2 weeks prior to CABG 12/2011  Substance Use Topics  . Alcohol use: Yes    Alcohol/week: 0.6 oz    Types: 1 Cans of beer per week    Comment: Drinks once per month; beer.   Objective:   BP 118/70 (BP Location: Left Arm, Patient Position: Sitting, Cuff Size: Normal)   Pulse 68   Temp 97.7 F (36.5 C)   Resp 16   Ht 5\' 10"  (1.778 m)   Wt 177 lb (80.3 kg)   SpO2 96%   BMI 25.40 kg/m  Vitals:   11/17/17 0931  BP: 118/70  Pulse: 68  Resp: 16  Temp: 97.7 F (36.5  C)  SpO2: 96%  Weight: 177 lb (80.3 kg)  Height: 5\' 10"  (1.778 m)     Physical Exam  Constitutional: He appears well-developed and well-nourished. No distress.  HENT:  Head: Normocephalic and atraumatic.  Right Ear: Hearing, external ear and ear canal normal. Tympanic membrane is not erythematous and not bulging. A middle ear effusion is present.  Left Ear: Hearing, external ear and ear canal normal. Tympanic membrane is not erythematous and not bulging. A middle ear effusion is present.  Nose: Mucosal edema and rhinorrhea present. Right sinus exhibits maxillary sinus tenderness and frontal sinus tenderness. Left sinus exhibits maxillary sinus tenderness and frontal sinus tenderness.  Mouth/Throat: Uvula is midline, oropharynx is clear  and moist and mucous membranes are normal. No oropharyngeal exudate, posterior oropharyngeal edema or posterior oropharyngeal erythema.  Eyes: Conjunctivae and EOM are normal. Pupils are equal, round, and reactive to light. Right eye exhibits no discharge. Left eye exhibits no discharge.  Neck: Normal range of motion. Neck supple. No tracheal deviation present. No Brudzinski's sign and no Kernig's sign noted. No thyromegaly present.  Cardiovascular: Normal rate, regular rhythm and normal heart sounds. Exam reveals no gallop and no friction rub.  No murmur heard. Pulmonary/Chest: Effort normal. No stridor. No respiratory distress. He has no wheezes. He has rhonchi (diffuse throughout). He has no rales.  Lymphadenopathy:    He has no cervical adenopathy.  Skin: Skin is warm and dry. He is not diaphoretic.  Vitals reviewed.       Assessment & Plan:     1. Bronchitis Worsening symptoms that have not responded to OTC medications. Will give Zpak and albuterol inhaler as below. Continue allergy medications. Stay well hydrated and get plenty of rest. Call if no symptom improvement or if symptoms worsen. - azithromycin (ZITHROMAX) 250 MG tablet; Take 2 tablets PO on day one, and one tablet PO daily thereafter until completed.  Dispense: 6 tablet; Refill: 0 - albuterol (PROVENTIL HFA;VENTOLIN HFA) 108 (90 Base) MCG/ACT inhaler; Inhale 2 puffs into the lungs every 6 (six) hours as needed for wheezing or shortness of breath.  Dispense: 1 Inhaler; Refill: 0  2. Acute pansinusitis, recurrence not specified See above medical treatment plan. - azithromycin (ZITHROMAX) 250 MG tablet; Take 2 tablets PO on day one, and one tablet PO daily thereafter until completed.  Dispense: 6 tablet; Refill: 0       Mar Daring, PA-C  [facility]

## 2017-11-18 ENCOUNTER — Encounter: Payer: Self-pay | Admitting: Physician Assistant

## 2017-11-24 ENCOUNTER — Telehealth: Payer: Self-pay | Admitting: *Deleted

## 2017-11-24 ENCOUNTER — Telehealth: Payer: Self-pay | Admitting: Oncology

## 2017-11-24 NOTE — Telephone Encounter (Signed)
Joseph Terry, please schedule a follow up appt for patient.  Lab (cbc, ferritin, iron, cmp) MD possible Venofer.  Labs need to be done 1-2 days prior

## 2017-11-24 NOTE — Telephone Encounter (Signed)
Message sent to scheduling to schedule.

## 2017-11-24 NOTE — Telephone Encounter (Signed)
Patient called answering service and is asking for an appointment stating he is feeling bad and "needs blood" Please advise as his next appointment is not until May

## 2017-11-25 ENCOUNTER — Inpatient Hospital Stay: Payer: BLUE CROSS/BLUE SHIELD | Attending: Oncology | Admitting: *Deleted

## 2017-11-25 DIAGNOSIS — Z9989 Dependence on other enabling machines and devices: Secondary | ICD-10-CM | POA: Diagnosis not present

## 2017-11-25 DIAGNOSIS — G4733 Obstructive sleep apnea (adult) (pediatric): Secondary | ICD-10-CM | POA: Insufficient documentation

## 2017-11-25 DIAGNOSIS — E119 Type 2 diabetes mellitus without complications: Secondary | ICD-10-CM | POA: Diagnosis not present

## 2017-11-25 DIAGNOSIS — Z888 Allergy status to other drugs, medicaments and biological substances status: Secondary | ICD-10-CM | POA: Insufficient documentation

## 2017-11-25 DIAGNOSIS — I10 Essential (primary) hypertension: Secondary | ICD-10-CM | POA: Insufficient documentation

## 2017-11-25 DIAGNOSIS — I2510 Atherosclerotic heart disease of native coronary artery without angina pectoris: Secondary | ICD-10-CM | POA: Insufficient documentation

## 2017-11-25 DIAGNOSIS — D473 Essential (hemorrhagic) thrombocythemia: Secondary | ICD-10-CM | POA: Diagnosis not present

## 2017-11-25 DIAGNOSIS — K2270 Barrett's esophagus without dysplasia: Secondary | ICD-10-CM | POA: Insufficient documentation

## 2017-11-25 DIAGNOSIS — D509 Iron deficiency anemia, unspecified: Secondary | ICD-10-CM

## 2017-11-25 DIAGNOSIS — Z7984 Long term (current) use of oral hypoglycemic drugs: Secondary | ICD-10-CM | POA: Insufficient documentation

## 2017-11-25 DIAGNOSIS — Z87891 Personal history of nicotine dependence: Secondary | ICD-10-CM | POA: Insufficient documentation

## 2017-11-25 DIAGNOSIS — E785 Hyperlipidemia, unspecified: Secondary | ICD-10-CM | POA: Diagnosis not present

## 2017-11-25 DIAGNOSIS — Z880 Allergy status to penicillin: Secondary | ICD-10-CM | POA: Insufficient documentation

## 2017-11-25 DIAGNOSIS — Z801 Family history of malignant neoplasm of trachea, bronchus and lung: Secondary | ICD-10-CM | POA: Insufficient documentation

## 2017-11-25 DIAGNOSIS — Z8042 Family history of malignant neoplasm of prostate: Secondary | ICD-10-CM | POA: Diagnosis not present

## 2017-11-25 DIAGNOSIS — Z79899 Other long term (current) drug therapy: Secondary | ICD-10-CM | POA: Insufficient documentation

## 2017-11-25 DIAGNOSIS — K219 Gastro-esophageal reflux disease without esophagitis: Secondary | ICD-10-CM | POA: Diagnosis not present

## 2017-11-25 DIAGNOSIS — D721 Eosinophilia: Secondary | ICD-10-CM | POA: Diagnosis not present

## 2017-11-25 LAB — CBC WITH DIFFERENTIAL/PLATELET
Basophils Absolute: 0.1 10*3/uL (ref 0–0.1)
Basophils Relative: 1 %
EOS PCT: 21 %
Eosinophils Absolute: 1.8 10*3/uL — ABNORMAL HIGH (ref 0–0.7)
HCT: 44.9 % (ref 40.0–52.0)
Hemoglobin: 15.3 g/dL (ref 13.0–18.0)
LYMPHS ABS: 1.7 10*3/uL (ref 1.0–3.6)
LYMPHS PCT: 19 %
MCH: 30 pg (ref 26.0–34.0)
MCHC: 34.2 g/dL (ref 32.0–36.0)
MCV: 87.7 fL (ref 80.0–100.0)
MONO ABS: 0.4 10*3/uL (ref 0.2–1.0)
MONOS PCT: 4 %
Neutro Abs: 4.6 10*3/uL (ref 1.4–6.5)
Neutrophils Relative %: 55 %
PLATELETS: 300 10*3/uL (ref 150–440)
RBC: 5.11 MIL/uL (ref 4.40–5.90)
RDW: 14.1 % (ref 11.5–14.5)
WBC: 8.6 10*3/uL (ref 3.8–10.6)

## 2017-11-25 LAB — COMPREHENSIVE METABOLIC PANEL
ALBUMIN: 4.4 g/dL (ref 3.5–5.0)
ALT: 32 U/L (ref 17–63)
AST: 29 U/L (ref 15–41)
Alkaline Phosphatase: 70 U/L (ref 38–126)
Anion gap: 11 (ref 5–15)
BILIRUBIN TOTAL: 0.4 mg/dL (ref 0.3–1.2)
BUN: 11 mg/dL (ref 6–20)
CHLORIDE: 103 mmol/L (ref 101–111)
CO2: 22 mmol/L (ref 22–32)
Calcium: 9 mg/dL (ref 8.9–10.3)
Creatinine, Ser: 0.78 mg/dL (ref 0.61–1.24)
GFR calc Af Amer: >60 mL/min (ref >60)
GFR calc non Af Amer: >60 mL/min (ref >60)
GLUCOSE: 164 mg/dL — AB (ref 65–99)
Potassium: 3.9 mmol/L (ref 3.5–5.1)
Sodium: 136 mmol/L (ref 135–145)
TOTAL PROTEIN: 7.6 g/dL (ref 6.5–8.1)

## 2017-11-25 LAB — IRON AND TIBC
Iron: 47 ug/dL (ref 45–182)
Saturation Ratios: 16 % — ABNORMAL LOW (ref 17.9–39.5)
TIBC: 298 ug/dL (ref 250–450)
UIBC: 252 ug/dL

## 2017-11-25 LAB — FERRITIN: Ferritin: 75 ng/mL (ref 24–336)

## 2017-11-26 ENCOUNTER — Telehealth: Payer: Self-pay | Admitting: *Deleted

## 2017-11-26 DIAGNOSIS — M5033 Other cervical disc degeneration, cervicothoracic region: Secondary | ICD-10-CM | POA: Diagnosis not present

## 2017-11-26 DIAGNOSIS — M5441 Lumbago with sciatica, right side: Secondary | ICD-10-CM | POA: Diagnosis not present

## 2017-11-26 DIAGNOSIS — M9901 Segmental and somatic dysfunction of cervical region: Secondary | ICD-10-CM | POA: Diagnosis not present

## 2017-11-26 DIAGNOSIS — M9903 Segmental and somatic dysfunction of lumbar region: Secondary | ICD-10-CM | POA: Diagnosis not present

## 2017-11-26 NOTE — Telephone Encounter (Signed)
Patient called to request his results from labs this week. He has been scheduled for an infusion on Monday.

## 2017-11-26 NOTE — Telephone Encounter (Signed)
He has appointment with me on Monday.

## 2017-11-29 ENCOUNTER — Other Ambulatory Visit: Payer: Self-pay | Admitting: Oncology

## 2017-11-29 NOTE — Progress Notes (Signed)
. [facility] Follow Up Note  Patient Care Team: Rubye Beach as PCP - General (Family Medicine) Terance Ice, MD as Consulting Physician (Cardiology) [name], MD as Consulting Physician (Family Medicine) Christene Lye, MD (General Surgery) Cristy Friedlander, MD (Oral Surgery)  REASON FOR VISIT Follow up for iron deficiency anemia  PERTINENT HEMATOLOGY HISTORY Joseph Terry 57 y.o. male with PMH listed as below is referred by primary care [facility] to me for evaluation and management of iron deficiency anemia. Patient reports feeling fatigued lately and he was seen by primary care March 30, 2017. His lab tests indicated anemia, thrombocytosis and iron deficiency.  He also had positive occult stool test. Per Epic records, he has had colonoscopy in 2009 and later 2015, with findings of multiple polyps and diverticulosis.   Denies weight loss, fever or chills, chest pain, SOB or abdominal pain. Denies having bright red blood in stool or black stool.  Primary is working on referring him to see GI or Dr.Sankar to get further work up.   Treatment: s/p IV Feraheme 510mg  x 2 doses. Iron store improved.  He has EGD and Colonoscopy during the interval by Dr.Wohl. EGD biopsy showed changes consistent with Barrett's esophagus. Colonoscopy showed polyps which were hyperplastic. Negative for malignancy.  However, despite no bleeding sites found on EGD and Colonoscopy, ferritin dropped again.  He got additional 2 doses of IV Feraheme  #  Had capsule endoscopy and was found to have multiple small bowel AVMs. Patient had small bowel endoscopy at [facility] and had APC of AVMs.  INTERVAL HISTORY 57yo male with above history presents to follow up on iron deficiency anemia. He has chronic GERD and takes PPI. He called and reports feeling fatigue and feels his "iron level" is low.  Today he actually feels well. He reports feeling not well  for a few days after recent acute viral infection. Yesterday he feels better and does not feel tried even after doing some garden work.    Review of Systems  Constitutional: Negative for chills, diaphoresis, fatigue and fever.  HENT:   Negative for lump/mass and nosebleeds.   Eyes: Negative for eye problems.  Respiratory: Negative for chest tightness and hemoptysis.   Cardiovascular: Negative for chest pain.  Gastrointestinal: Negative for abdominal distention, blood in stool, constipation, diarrhea and nausea.       Acid reflux.   Endocrine: Negative for hot flashes.  Genitourinary: Negative for difficulty urinating, frequency and hematuria.   Musculoskeletal: Negative for arthralgias.  Skin: Negative.   Neurological: Negative for dizziness.  Hematological: Negative for adenopathy.  Psychiatric/Behavioral: Negative for confusion. The patient is not nervous/anxious.     MEDICAL HISTORY: Past Medical History:  Diagnosis Date  . Anxiety state, unspecified   . Barrett's esophagus   . CAD (coronary artery disease), severe multivessel CAD surgical consult for CABG    a. 12/2011 Cath: 3 vessel CAD with normal EF;  b. 12/2011 CABG x 5: LIMA to LAD, SVG to distal RCA with sequential SVG to PDA, SVG to OM, and SVG to D2;  c. 03/2014 ETT: Ex time 6:21, 63mm horizontal ST dep in V3-V6, HTN response.  . Depression   . Diaphragmatic hernia without mention of obstruction or gangrene   . Diverticulosis   . GERD (gastroesophageal reflux disease)   . Hyperlipidemia   . Hypertension   . Impotence of organic origin   . Internal hemorrhoids without mention of complication   .  Iron deficiency anemia due to chronic blood loss 04/15/2017  . Kidney stone   . Motion sickness    cars, boats  . OSA on CPAP    a. Sleep study 07/2013 +sever OSA; CPAP at 10cm recommended.  . Personal history of colonic polyps   . Type II diabetes mellitus ([place])   . Wears contact lenses     SURGICAL HISTORY: Past Surgical  History:  Procedure Laterality Date  . CARDIAC CATHETERIZATION  4 /19/ 2013   [place]  11/03/13  . COLONOSCOPY  11/01/13   multiple polyps; diverticulosis. Sankar. Repeat 5 years.  . COLONOSCOPY W/ POLYPECTOMY  09/16/2007   colon polyps.  Repeat 5 years. Ift[facility]har.  . COLONOSCOPY WITH PROPOFOL N/A 06/04/2017   Procedure: COLONOSCOPY WITH PROPOFOL;  Surgeon: [name], MD;  Location: [place];  Service: Gastroenterology;  Laterality: N/A;  needs to remain first patient stated he also suppose to get EGD also  . CORONARY ARTERY BYPASS GRAFT  01/05/2012   Procedure: CORONARY ARTERY BYPASS GRAFTING (CABG);  Surgeon: Rexene Alberts, MD;  Location: [place];  Service: Open Heart Surgery;  Laterality: N/A;  coronary artery bypass graft times five using left internal mammary artery and right leg greater saphenous vein harvested endoscopically   . ESOPHAGOGASTRODUODENOSCOPY N/A 06/04/2017   PT NEEDS REPEAT IN 06/2020/BARRETT'S ESOPHAGUS - Surgeon: [name], MD  . GIVENS CAPSULE STUDY N/A 06/22/2017   Procedure: GIVENS CAPSULE STUDY;  Surgeon: [name], MD;  Location: [facility] ENDOSCOPY;  Service: Endoscopy;  Laterality: N/A;  . LEFT HEART CATHETERIZATION WITH CORONARY ANGIOGRAM N/A 01/02/2012   Procedure: LEFT HEART CATHETERIZATION WITH CORONARY ANGIOGRAM;  Surgeon: Troy Sine, MD;  Location: [facility] CATH LAB;  Service: Cardiovascular;  Laterality: N/A;  . POLYPECTOMY N/A 06/04/2017   Procedure: POLYPECTOMY;  Surgeon: [name], MD;  Location: [place];  Service: Gastroenterology;  Laterality: N/A;  . POLYSOMNOGRAPHY  07/2013   +severe OSA; CPAP at 10cm pressure recommended.  . rotator cuff curgery  2011   lt rotator cuff  . Shoulder surgery  1997   bilateral  . WISDOM TOOTH EXTRACTION      SOCIAL HISTORY: Social History   Socioeconomic History  . Marital status: Divorced    Spouse name: Not on file  . Number of children: 1  . Years of  education: 80  . Highest education level: Not on file  Social Needs  . Financial resource strain: Not on file  . Food insecurity - worry: Not on file  . Food insecurity - inability: Not on file  . Transportation needs - medical: Not on file  . Transportation needs - non-medical: Not on file  Occupational History  . Occupation: Printmaker: [employer]    Comment: x 30 yrs  Tobacco Use  . Smoking status: Former Smoker    Packs/day: 1.50    Years: 35.00    Pack years: 52.50    Types: Cigarettes    Last attempt to quit: 11/14/2011    Years since quitting: 6.0  . Smokeless tobacco: Never Used  . Tobacco comment: quit 2 weeks prior to CABG 12/2011  Substance and Sexual Activity  . Alcohol use: Yes    Alcohol/week: 0.6 oz    Types: 1 Cans of beer per week    Comment: Drinks once per month; beer.  . Drug use: No  . Sexual activity: Yes    Partners: Female    Birth control/protection:  Condom  Other Topics Concern  . Not on file  Social History Narrative   Marital status: divorced since 1996; dating casually in 2016.        Lives: Lives alone.          Children:  One son and a granddaughter 23 years old live in [place].       Tobacco: quit with CABG      Alcohol: weekends; 6-8 beers on average.  Beers with football; ten on football weekends.      Exercise: Light, yard work.        Employment:  Works 45-50 hrs. Some weeks.  [employer].  Desk work since 05/2013. Since 1979.      Guns:  Guns in the home not stored in locked cabinet. Smoke alarm in home, Always wears seatbelts.       Sexual History: active;Last HIV testing 2015; has has 25+ partners; females only.             FAMILY HISTORY Family History  Problem Relation Age of Onset  . Heart attack Mother        defribillator in place  . Hypertension Mother   . Hyperlipidemia Mother   . Heart disease Mother        CHF with defibrillator;  CAD.  . Diabetes Mother   . Cancer Mother 72       Lung  cancer  . Hypertension Father   . Hyperlipidemia Father   . Cancer Father 5        prostate cancer s/p  implants  . COPD Father   . Hypertension Sister   . Heart disease Maternal Grandmother   . Heart disease Maternal Grandfather   . Cancer Paternal Grandmother   . Cancer Paternal Grandfather   . Hypertension Sister     ALLERGIES:  is allergic to penicillins and tramadol.  MEDICATIONS:  Current Outpatient Medications  Medication Sig Dispense Refill  . albuterol (PROVENTIL HFA;VENTOLIN HFA) 108 (90 Base) MCG/ACT inhaler Inhale 2 puffs into the lungs every 6 (six) hours as needed for wheezing or shortness of breath. 1 Inhaler 0  . atorvastatin (LIPITOR) 40 MG tablet TAKE 1 TABLET(40 MG) BY MOUTH DAILY AT 6 PM 90 tablet 1  . azithromycin (ZITHROMAX) 250 MG tablet Take 2 tablets PO on day one, and one tablet PO daily thereafter until completed. 6 tablet 0  . clonazePAM (KLONOPIN) 1 MG tablet TAKE 1 TABLET BY MOUTH TWICE DAILY AS NEEDED 60 tablet 5  . FARXIGA 10 MG TABS tablet TAKE 1 TABLET BY MOUTH DAILY 90 tablet 1  . glipiZIDE (GLUCOTROL XL) 10 MG 24 hr tablet TAKE 1 TABLET(10 MG) BY MOUTH DAILY WITH BREAKFAST 90 tablet 1  . metFORMIN (GLUCOPHAGE) 1000 MG tablet TAKE 1 TABLET(1000 MG) BY MOUTH TWICE DAILY WITH A MEAL 180 tablet 1  . metoprolol tartrate (LOPRESSOR) 25 MG tablet TAKE 1 TABLET(25 MG) BY MOUTH TWICE DAILY 180 tablet 1  . omeprazole (PRILOSEC) 40 MG capsule Take 40 mg by mouth daily.    . sitaGLIPtin (JANUVIA) 100 MG tablet TAKE 1 TABLET (100 MG TOTAL) BY MOUTH DAILY. 90 tablet 1  . venlafaxine XR (EFFEXOR-XR) 150 MG 24 hr capsule TAKE 1 CAPSULE(150 MG) BY MOUTH DAILY WITH BREAKFAST 90 capsule 1   No current facility-administered medications for this visit.     PHYSICAL EXAMINATION:  ECOG PERFORMANCE STATUS: 0 - Asymptomatic   Vitals:   11/30/17 1359 11/30/17 1407  BP:  135/83  Pulse:  84  Resp: 12   Temp:  (!) 97.5 F (36.4 C)    Filed Weights   11/30/17  1359  Weight: 177 lb 3.2 oz (80.4 kg)   Physical Exam  Constitutional: He is oriented to person, place, and time and well-developed, well-nourished, and in no distress. No distress.  HENT:  Head: Normocephalic and atraumatic.  Mouth/Throat: No oropharyngeal exudate.  Eyes: Conjunctivae and EOM are normal. Pupils are equal, round, and reactive to light. Right eye exhibits no discharge. No scleral icterus.  Neck: Normal range of motion. Neck supple.  Cardiovascular: Normal rate, regular rhythm and normal heart sounds.  Pulmonary/Chest: Effort normal and breath sounds normal. He has no wheezes.  Abdominal: Soft. Bowel sounds are normal. There is no tenderness. There is no rebound.  Musculoskeletal: Normal range of motion. He exhibits no edema.  Lymphadenopathy:    He has no cervical adenopathy.  Neurological: He is alert and oriented to person, place, and time. No cranial nerve deficit. Coordination normal.  Skin: Skin is warm and dry.  Psychiatric: Affect normal.     LABORATORY DATA: I have personally reviewed the data as listed: CBC    Component Value Date/Time   WBC 8.6 11/25/2017 1426   RBC 5.11 11/25/2017 1426   HGB 15.3 11/25/2017 1426   HGB 9.6 (L) 03/30/2017 0912   HCT 44.9 11/25/2017 1426   HCT 33.5 (L) 03/30/2017 0912   PLT 300 11/25/2017 1426   PLT 415 (H) 03/30/2017 0912   MCV 87.7 11/25/2017 1426   MCV 64 (L) 03/30/2017 0912   MCH 30.0 11/25/2017 1426   MCHC 34.2 11/25/2017 1426   RDW 14.1 11/25/2017 1426   RDW 19.1 (H) 03/30/2017 0912   LYMPHSABS 1.7 11/25/2017 1426   LYMPHSABS 1.9 03/30/2017 0912   MONOABS 0.4 11/25/2017 1426   EOSABS 1.8 (H) 11/25/2017 1426   EOSABS 1.2 (H) 03/30/2017 0912   BASOSABS 0.1 11/25/2017 1426   BASOSABS 0.1 03/30/2017 0912   ASSESSMENT/PLAN 1. Iron deficiency anemia due to chronic blood loss   2. Barrett's esophagus without dysplasia   3. Eosinophilia   . 1 CBC and iron panel reviewed with patient,  His iron store appears  to decreased from last check, which was a few weeks after IV iron.  Discussed with about starting oral Ferrous sulfate BID with Colace as maintenance.  He can repeat CBC, iron tibc and ferritin in 4 months for re-evaluation.   2 Barrett's esophagous: Follow up with Dr.Wohl 3 Eosinophilia, can be due to acute infection. Monitor. Repeat CBC at next visit.   Follow in 4 months to recheck labs. CBC iron TIBC and ferritin All questions were answered. The patient knows to call the clinic with any problems, questions or concerns.  Earlie Server, MD, PhD Hematology Oncology [facility] Pager- [phone] 11/30/2017

## 2017-11-29 NOTE — Progress Notes (Unsigned)
. [facility] Follow Up Note  Patient Care Team: Rubye Beach as PCP - General (Family Medicine) Terance Ice, MD as Consulting Physician (Cardiology) [name], MD as Consulting Physician (Family Medicine) Christene Lye, MD (General Surgery) Cristy Friedlander, MD (Oral Surgery)  REASON FOR VISIT Follow up for iron deficiency anemia  PERTINENT HEMATOLOGY HISTORY Joseph Terry 57 y.o. male with PMH listed as below is referred by primary care [facility] to me for evaluation and management of iron deficiency anemia. Patient reports feeling fatigued lately and he was seen by primary care March 30, 2017. His lab tests indicated anemia, thrombocytosis and iron deficiency.  He also had positive occult stool test. Per Epic records, he has had colonoscopy in 2009 and later 2015, with findings of multiple polyps and diverticulosis.   Denies weight loss, fever or chills, chest pain, SOB or abdominal pain. Denies having bright red blood in stool or black stool.  Primary is working on referring him to see GI or Dr.Sankar to get further work up.   Treatment: s/p IV Feraheme 510mg  x 2 doses. Iron store improved.  He has EGD and Colonoscopy during the interval by Dr.Wohl. EGD biopsy showed changes consistent with Barrett's esophagus. Colonoscopy showed polyps which were hyperplastic. Negative for malignancy.  However, despite no bleeding sites found on EGD and Colonoscopy, ferritin dropped again.  He got additional 2 doses of IV Feraheme  INTERVAL HISTORY 57yo male with above history presents to follow up on iron deficiency anemia. He has chronic GERD and takes PPI. Had capsule endoscopy and was found to have multiple small bowel AVMs. Patient had small bowel endoscopy at [facility] and had APC of AVMs.  Patient reports energy level has not fully recovered to his baseline yet. Fatigue slightly better.    Review of Systems   Constitutional: Negative.  Negative for chills and fever.  HENT:  Negative.  Negative for lump/mass.   Eyes: Negative.  Negative for eye problems.  Respiratory: Negative.  Negative for chest tightness.   Cardiovascular: Negative.  Negative for chest pain.  Gastrointestinal: Negative for abdominal distention.       Acid reflux.   Endocrine: Negative.  Negative for hot flashes.  Genitourinary: Negative.  Negative for difficulty urinating.   Musculoskeletal: Negative.  Negative for arthralgias.  Skin: Negative.   Neurological: Negative.  Negative for dizziness.  Hematological: Negative.  Negative for adenopathy.  Psychiatric/Behavioral: Negative.  The patient is not nervous/anxious.     MEDICAL HISTORY: Past Medical History:  Diagnosis Date  . Anxiety state, unspecified   . Barrett's esophagus   . CAD (coronary artery disease), severe multivessel CAD surgical consult for CABG    a. 12/2011 Cath: 3 vessel CAD with normal EF;  b. 12/2011 CABG x 5: LIMA to LAD, SVG to distal RCA with sequential SVG to PDA, SVG to OM, and SVG to D2;  c. 03/2014 ETT: Ex time 6:21, 79mm horizontal ST dep in V3-V6, HTN response.  . Depression   . Diaphragmatic hernia without mention of obstruction or gangrene   . Diverticulosis   . GERD (gastroesophageal reflux disease)   . Hyperlipidemia   . Hypertension   . Impotence of organic origin   . Internal hemorrhoids without mention of complication   . Iron deficiency anemia due to chronic blood loss 04/15/2017  . Kidney stone   . Motion sickness    cars, boats  . OSA on CPAP  a. Sleep study 07/2013 +sever OSA; CPAP at 10cm recommended.  . Personal history of colonic polyps   . Type II diabetes mellitus ([place])   . Wears contact lenses     SURGICAL HISTORY: Past Surgical History:  Procedure Laterality Date  . CARDIAC CATHETERIZATION  4 /19/ 2013   [place]  11/03/13  . COLONOSCOPY  11/01/13   multiple polyps; diverticulosis.  Sankar. Repeat 5 years.  . COLONOSCOPY W/ POLYPECTOMY  09/16/2007   colon polyps.  Repeat 5 years. [facility].  . COLONOSCOPY WITH PROPOFOL N/A 06/04/2017   Procedure: COLONOSCOPY WITH PROPOFOL;  Surgeon: [name], MD;  Location: [place];  Service: Gastroenterology;  Laterality: N/A;  needs to remain first patient stated he also suppose to get EGD also  . CORONARY ARTERY BYPASS GRAFT  01/05/2012   Procedure: CORONARY ARTERY BYPASS GRAFTING (CABG);  Surgeon: Rexene Alberts, MD;  Location: [place];  Service: Open Heart Surgery;  Laterality: N/A;  coronary artery bypass graft times five using left internal mammary artery and right leg greater saphenous vein harvested endoscopically   . ESOPHAGOGASTRODUODENOSCOPY N/A 06/04/2017   PT NEEDS REPEAT IN 06/2020/BARRETT'S ESOPHAGUS - Surgeon: [name], MD  . GIVENS CAPSULE STUDY N/A 06/22/2017   Procedure: GIVENS CAPSULE STUDY;  Surgeon: [name], MD;  Location: [facility] ENDOSCOPY;  Service: Endoscopy;  Laterality: N/A;  . LEFT HEART CATHETERIZATION WITH CORONARY ANGIOGRAM N/A 01/02/2012   Procedure: LEFT HEART CATHETERIZATION WITH CORONARY ANGIOGRAM;  Surgeon: Troy Sine, MD;  Location: [facility] CATH LAB;  Service: Cardiovascular;  Laterality: N/A;  . POLYPECTOMY N/A 06/04/2017   Procedure: POLYPECTOMY;  Surgeon: [name], MD;  Location: [place];  Service: Gastroenterology;  Laterality: N/A;  . POLYSOMNOGRAPHY  07/2013   +severe OSA; CPAP at 10cm pressure recommended.  . rotator cuff curgery  2011   lt rotator cuff  . Shoulder surgery  1997   bilateral  . WISDOM TOOTH EXTRACTION      SOCIAL HISTORY: Social History   Socioeconomic History  . Marital status: Divorced    Spouse name: Not on file  . Number of children: 1  . Years of education: 13  . Highest education level: Not on file  Social Needs  . Financial resource strain: Not on file  . Food insecurity - worry: Not on file  . Food insecurity - inability: Not on  file  . Transportation needs - medical: Not on file  . Transportation needs - non-medical: Not on file  Occupational History  . Occupation: Printmaker: [employer]    Comment: x 30 yrs  Tobacco Use  . Smoking status: Former Smoker    Packs/day: 1.50    Years: 35.00    Pack years: 52.50    Types: Cigarettes    Last attempt to quit: 11/14/2011    Years since quitting: 6.0  . Smokeless tobacco: Never Used  . Tobacco comment: quit 2 weeks prior to CABG 12/2011  Substance and Sexual Activity  . Alcohol use: Yes    Alcohol/week: 0.6 oz    Types: 1 Cans of beer per week    Comment: Drinks once per month; beer.  . Drug use: No  . Sexual activity: Yes    Partners: Female    Birth control/protection: Condom  Other Topics Concern  . Not on file  Social History Narrative   Marital status: divorced since 1996; dating casually in 2016.  Lives: Lives alone.          Children:  One son and a granddaughter 65 years old live in [place].       Tobacco: quit with CABG      Alcohol: weekends; 6-8 beers on average.  Beers with football; ten on football weekends.      Exercise: Light, yard work.        Employment:  Works 45-50 hrs. Some weeks.  [employer].  Desk work since 05/2013. Since 1979.      Guns:  Guns in the home not stored in locked cabinet. Smoke alarm in home, Always wears seatbelts.       Sexual History: active;Last HIV testing 2015; has has 25+ partners; females only.             FAMILY HISTORY Family History  Problem Relation Age of Onset  . Heart attack Mother        defribillator in place  . Hypertension Mother   . Hyperlipidemia Mother   . Heart disease Mother        CHF with defibrillator;  CAD.  . Diabetes Mother   . Cancer Mother 31       Lung cancer  . Hypertension Father   . Hyperlipidemia Father   . Cancer Father 21        prostate cancer s/p  implants  . COPD Father   . Hypertension Sister   . Heart disease Maternal Grandmother    . Heart disease Maternal Grandfather   . Cancer Paternal Grandmother   . Cancer Paternal Grandfather   . Hypertension Sister     ALLERGIES:  is allergic to penicillins and tramadol.  MEDICATIONS:  Current Outpatient Medications  Medication Sig Dispense Refill  . albuterol (PROVENTIL HFA;VENTOLIN HFA) 108 (90 Base) MCG/ACT inhaler Inhale 2 puffs into the lungs every 6 (six) hours as needed for wheezing or shortness of breath. 1 Inhaler 0  . atorvastatin (LIPITOR) 40 MG tablet TAKE 1 TABLET(40 MG) BY MOUTH DAILY AT 6 PM 90 tablet 1  . azithromycin (ZITHROMAX) 250 MG tablet Take 2 tablets PO on day one, and one tablet PO daily thereafter until completed. 6 tablet 0  . clonazePAM (KLONOPIN) 1 MG tablet TAKE 1 TABLET BY MOUTH TWICE DAILY AS NEEDED 60 tablet 5  . FARXIGA 10 MG TABS tablet TAKE 1 TABLET BY MOUTH DAILY 90 tablet 1  . glipiZIDE (GLUCOTROL XL) 10 MG 24 hr tablet TAKE 1 TABLET(10 MG) BY MOUTH DAILY WITH BREAKFAST 90 tablet 1  . metFORMIN (GLUCOPHAGE) 1000 MG tablet TAKE 1 TABLET(1000 MG) BY MOUTH TWICE DAILY WITH A MEAL 180 tablet 1  . metoprolol tartrate (LOPRESSOR) 25 MG tablet TAKE 1 TABLET(25 MG) BY MOUTH TWICE DAILY 180 tablet 1  . omeprazole (PRILOSEC) 40 MG capsule Take 40 mg by mouth daily.    . sitaGLIPtin (JANUVIA) 100 MG tablet TAKE 1 TABLET (100 MG TOTAL) BY MOUTH DAILY. 90 tablet 1  . venlafaxine XR (EFFEXOR-XR) 150 MG 24 hr capsule TAKE 1 CAPSULE(150 MG) BY MOUTH DAILY WITH BREAKFAST 90 capsule 1   No current facility-administered medications for this visit.     PHYSICAL EXAMINATION:  ECOG PERFORMANCE STATUS: 0 - Asymptomatic   There were no vitals filed for this visit.  There were no vitals filed for this visit. Physical Exam  GENERAL: No distress, well nourished.  SKIN:  No rashes or significant lesions  HEAD: Normocephalic, No masses, lesions, tenderness or abnormalities  EYES: Conjunctiva  are pink, non icteric ENT: External ears normal ,lips , buccal  mucosa, and tongue normal and mucous membranes are moist  LYMPH: No palpable cervical and axillary lymphadenopathy  LUNGS: Clear to auscultation, no crackles or wheezes HEART: Regular rate & rhythm, no murmurs, no gallops, S1 normal and S2 normal  ABDOMEN: Abdomen soft, non-tender, normal bowel sounds, I did not appreciate any  masses or organomegaly  MUSCULOSKELETAL: No CVA tenderness and no tenderness on percussion of the back or rib cage.  EXTREMITIES: No edema, no skin discoloration or tenderness NEURO: Alert & oriented, no focal motor/sensory deficits.  LABORATORY DATA: I have personally reviewed the data as listed: CBC    Component Value Date/Time   WBC 8.6 11/25/2017 1426   RBC 5.11 11/25/2017 1426   HGB 15.3 11/25/2017 1426   HGB 9.6 (L) 03/30/2017 0912   HCT 44.9 11/25/2017 1426   HCT 33.5 (L) 03/30/2017 0912   PLT 300 11/25/2017 1426   PLT 415 (H) 03/30/2017 0912   MCV 87.7 11/25/2017 1426   MCV 64 (L) 03/30/2017 0912   MCH 30.0 11/25/2017 1426   MCHC 34.2 11/25/2017 1426   RDW 14.1 11/25/2017 1426   RDW 19.1 (H) 03/30/2017 0912   LYMPHSABS 1.7 11/25/2017 1426   LYMPHSABS 1.9 03/30/2017 0912   MONOABS 0.4 11/25/2017 1426   EOSABS 1.8 (H) 11/25/2017 1426   EOSABS 1.2 (H) 03/30/2017 0912   BASOSABS 0.1 11/25/2017 1426   BASOSABS 0.1 03/30/2017 0912   RADIOGRAPHIC STUDIES: I have personally reviewed the radiological images as listed and agree with the findings in the report No recent images.   ASSESSMENT/PLAN No diagnosis found.. 1  S/p IV feraheme x 4 doses. Iron store improved. He is s/p APC of his small bowel AVMS, and anticipate that his chronic blood loss has stopped. No hematuria on UA. Glucosuria detected. Patient admits not following diabetic diet and he was advised to follow up with primary provider for tighter control of his diabetes.  He can repeat CBC, iron tibc and ferritin in 6 months for re-evaluation.   2 Barrett;s esophagous: Follow up with  Dr.Wohl  Follow in 6 months to recheck labs. CBC iron TIBC and ferritin All questions were answered. The patient knows to call the clinic with any problems, questions or concerns. Thank you for this kind referral and the opportunity to participate in the care of this patient. A copy of today's note is routed to referring physician Fenton Malling.   Dr. Earlie Server, MD, PhD [facility] at [facility] Pager- [phone] 11/29/2017

## 2017-11-30 ENCOUNTER — Inpatient Hospital Stay (HOSPITAL_BASED_OUTPATIENT_CLINIC_OR_DEPARTMENT_OTHER): Payer: BLUE CROSS/BLUE SHIELD | Admitting: Oncology

## 2017-11-30 ENCOUNTER — Inpatient Hospital Stay: Payer: BLUE CROSS/BLUE SHIELD

## 2017-11-30 ENCOUNTER — Encounter: Payer: Self-pay | Admitting: Oncology

## 2017-11-30 ENCOUNTER — Other Ambulatory Visit: Payer: Self-pay

## 2017-11-30 VITALS — BP 135/83 | HR 84 | Temp 97.5°F | Resp 12 | Ht 70.0 in | Wt 177.2 lb

## 2017-11-30 DIAGNOSIS — I10 Essential (primary) hypertension: Secondary | ICD-10-CM | POA: Diagnosis not present

## 2017-11-30 DIAGNOSIS — Z87891 Personal history of nicotine dependence: Secondary | ICD-10-CM

## 2017-11-30 DIAGNOSIS — Z880 Allergy status to penicillin: Secondary | ICD-10-CM | POA: Diagnosis not present

## 2017-11-30 DIAGNOSIS — D500 Iron deficiency anemia secondary to blood loss (chronic): Secondary | ICD-10-CM

## 2017-11-30 DIAGNOSIS — G4733 Obstructive sleep apnea (adult) (pediatric): Secondary | ICD-10-CM | POA: Diagnosis not present

## 2017-11-30 DIAGNOSIS — K219 Gastro-esophageal reflux disease without esophagitis: Secondary | ICD-10-CM

## 2017-11-30 DIAGNOSIS — Z801 Family history of malignant neoplasm of trachea, bronchus and lung: Secondary | ICD-10-CM

## 2017-11-30 DIAGNOSIS — K2270 Barrett's esophagus without dysplasia: Secondary | ICD-10-CM

## 2017-11-30 DIAGNOSIS — Z8042 Family history of malignant neoplasm of prostate: Secondary | ICD-10-CM

## 2017-11-30 DIAGNOSIS — Z79899 Other long term (current) drug therapy: Secondary | ICD-10-CM

## 2017-11-30 DIAGNOSIS — D473 Essential (hemorrhagic) thrombocythemia: Secondary | ICD-10-CM | POA: Diagnosis not present

## 2017-11-30 DIAGNOSIS — Z9989 Dependence on other enabling machines and devices: Secondary | ICD-10-CM | POA: Diagnosis not present

## 2017-11-30 DIAGNOSIS — I2510 Atherosclerotic heart disease of native coronary artery without angina pectoris: Secondary | ICD-10-CM | POA: Diagnosis not present

## 2017-11-30 DIAGNOSIS — Z888 Allergy status to other drugs, medicaments and biological substances status: Secondary | ICD-10-CM | POA: Diagnosis not present

## 2017-11-30 DIAGNOSIS — D721 Eosinophilia, unspecified: Secondary | ICD-10-CM

## 2017-11-30 DIAGNOSIS — E785 Hyperlipidemia, unspecified: Secondary | ICD-10-CM

## 2017-11-30 DIAGNOSIS — D509 Iron deficiency anemia, unspecified: Secondary | ICD-10-CM | POA: Diagnosis not present

## 2017-11-30 DIAGNOSIS — Z7984 Long term (current) use of oral hypoglycemic drugs: Secondary | ICD-10-CM | POA: Diagnosis not present

## 2017-11-30 DIAGNOSIS — E119 Type 2 diabetes mellitus without complications: Secondary | ICD-10-CM | POA: Diagnosis not present

## 2017-11-30 DIAGNOSIS — H524 Presbyopia: Secondary | ICD-10-CM | POA: Diagnosis not present

## 2017-11-30 LAB — HM DIABETES EYE EXAM

## 2017-11-30 MED ORDER — DOCUSATE SODIUM 100 MG PO CAPS: 100.0000 mg | ORAL_CAPSULE | Freq: Every day | ORAL | 3 refills | Status: DC

## 2017-11-30 MED ORDER — FERROUS SULFATE 325 (65 FE) MG PO TBEC: 325.0000 mg | DELAYED_RELEASE_TABLET | Freq: Two times a day (BID) | ORAL | 3 refills | Status: DC

## 2017-11-30 NOTE — Progress Notes (Signed)
Patient here for follow up. He reports that he is feeling really rundown, he has headaches for about a month.

## 2017-12-04 DIAGNOSIS — H5213 Myopia, bilateral: Secondary | ICD-10-CM | POA: Diagnosis not present

## 2017-12-07 ENCOUNTER — Encounter: Payer: Self-pay | Admitting: Physician Assistant

## 2017-12-10 ENCOUNTER — Other Ambulatory Visit: Payer: Self-pay | Admitting: Physician Assistant

## 2017-12-10 DIAGNOSIS — E119 Type 2 diabetes mellitus without complications: Secondary | ICD-10-CM

## 2017-12-21 ENCOUNTER — Telehealth: Payer: Self-pay | Admitting: Physician Assistant

## 2017-12-21 NOTE — Telephone Encounter (Signed)
Received ROI for [facility] on 9.4.2018

## 2018-01-18 ENCOUNTER — Other Ambulatory Visit: Payer: BLUE CROSS/BLUE SHIELD

## 2018-01-20 ENCOUNTER — Ambulatory Visit: Payer: BLUE CROSS/BLUE SHIELD | Admitting: Oncology

## 2018-02-04 ENCOUNTER — Other Ambulatory Visit: Payer: Self-pay | Admitting: Physician Assistant

## 2018-02-04 DIAGNOSIS — E119 Type 2 diabetes mellitus without complications: Secondary | ICD-10-CM

## 2018-02-08 ENCOUNTER — Encounter: Payer: Self-pay | Admitting: Family Medicine

## 2018-02-12 ENCOUNTER — Other Ambulatory Visit: Payer: Self-pay | Admitting: Physician Assistant

## 2018-02-12 DIAGNOSIS — I10 Essential (primary) hypertension: Secondary | ICD-10-CM

## 2018-02-17 DIAGNOSIS — M9901 Segmental and somatic dysfunction of cervical region: Secondary | ICD-10-CM | POA: Diagnosis not present

## 2018-02-17 DIAGNOSIS — M5441 Lumbago with sciatica, right side: Secondary | ICD-10-CM | POA: Diagnosis not present

## 2018-02-17 DIAGNOSIS — M9903 Segmental and somatic dysfunction of lumbar region: Secondary | ICD-10-CM | POA: Diagnosis not present

## 2018-02-17 DIAGNOSIS — M5033 Other cervical disc degeneration, cervicothoracic region: Secondary | ICD-10-CM | POA: Diagnosis not present

## 2018-02-25 ENCOUNTER — Encounter: Payer: Self-pay | Admitting: Physician Assistant

## 2018-02-25 DIAGNOSIS — I25708 Atherosclerosis of coronary artery bypass graft(s), unspecified, with other forms of angina pectoris: Secondary | ICD-10-CM | POA: Diagnosis not present

## 2018-02-26 ENCOUNTER — Encounter: Payer: Self-pay | Admitting: Physician Assistant

## 2018-03-02 ENCOUNTER — Encounter: Payer: Self-pay | Admitting: Physician Assistant

## 2018-03-02 ENCOUNTER — Ambulatory Visit: Payer: BLUE CROSS/BLUE SHIELD | Admitting: Physician Assistant

## 2018-03-02 VITALS — BP 120/70 | HR 74 | Temp 97.5°F | Resp 16 | Wt 175.0 lb

## 2018-03-02 DIAGNOSIS — F418 Other specified anxiety disorders: Secondary | ICD-10-CM

## 2018-03-02 NOTE — Progress Notes (Signed)
Patient: Joseph Terry Male    DOB: Jun 22, 1961   57 y.o.   MRN: [id number] Visit Date: 03/02/2018  Today's Provider: Mar Daring, PA-C   Chief Complaint  Patient presents with  . Medication Problem   Subjective:    HPI Patient here today to talk about his Klonopin. Patient has been on Klonopin for years and stable. 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 They are wanting him to stop Klonopin due to possibility of decreased reaction time, made him have another stress test even though last year was negative and want him to have another sleep study even though he had a sleep study in 2014, used a CPAP and had no improvement in symptoms or changes so he discontinued.      Allergies  Allergen Reactions  . Penicillins Itching    tingling  . Tramadol Rash     Current Outpatient Medications:  .  atorvastatin (LIPITOR) 40 MG tablet, TAKE 1 TABLET(40 MG) BY MOUTH DAILY AT 6 PM, Disp: 90 tablet, Rfl: 1 .  clonazePAM (KLONOPIN) 1 MG tablet, TAKE 1 TABLET BY MOUTH TWICE DAILY AS NEEDED, Disp: 60 tablet, Rfl: 5 .  FARXIGA 10 MG TABS tablet, TAKE 1 TABLET BY MOUTH DAILY, Disp: 90 tablet, Rfl: 1 .  ferrous sulfate 325 (65 FE) MG EC tablet, Take 1 tablet (325 mg total) by mouth 2 (two) times daily with a meal., Disp: 60 tablet, Rfl: 3 .  glipiZIDE (GLUCOTROL XL) 10 MG 24 hr tablet, TAKE 1 TABLET BY MOUTH EVERY DAY WITH BREAKFAST, Disp: 90 tablet, Rfl: 3 .  metFORMIN (GLUCOPHAGE) 1000 MG tablet, TAKE 1 TABLET(1000 MG) BY MOUTH TWICE DAILY WITH A MEAL, Disp: 180 tablet, Rfl: 1 .  metoprolol tartrate (LOPRESSOR) 25 MG tablet, TAKE 1 TABLET(25 MG) BY MOUTH TWICE DAILY, Disp: 180 tablet, Rfl: 1 .  omeprazole (PRILOSEC) 40 MG capsule, Take 40 mg by mouth daily., Disp: , Rfl:  .  sitaGLIPtin (JANUVIA) 100 MG tablet, TAKE 1 TABLET (100 MG TOTAL) BY MOUTH DAILY., Disp: 90 tablet, Rfl: 1 .  venlafaxine XR (EFFEXOR-XR) 150 MG 24 hr capsule, TAKE 1 CAPSULE(150 MG) BY MOUTH DAILY  WITH BREAKFAST, Disp: 90 capsule, Rfl: 1 .  albuterol (PROVENTIL HFA;VENTOLIN HFA) 108 (90 Base) MCG/ACT inhaler, Inhale 2 puffs into the lungs every 6 (six) hours as needed for wheezing or shortness of breath. (Patient not taking: Reported on 03/02/2018), Disp: 1 Inhaler, Rfl: 0  Review of Systems  Constitutional: Negative.   Respiratory: Negative.   Cardiovascular: Negative.   Psychiatric/Behavioral: Negative.     Social History   Tobacco Use  . Smoking status: Former Smoker    Packs/day: 1.50    Years: 35.00    Pack years: 52.50    Types: Cigarettes    Last attempt to quit: 11/14/2011    Years since quitting: 6.3  . Smokeless tobacco: Never Used  . Tobacco comment: quit 2 weeks prior to CABG 12/2011  Substance Use Topics  . Alcohol use: Yes    Alcohol/week: 0.6 oz    Types: 1 Cans of beer per week    Comment: Drinks once per month; beer.   Objective:   BP 120/70 (BP Location: Left Arm, Patient Position: Sitting, Cuff Size: Normal)   Pulse 74   Temp (!) 97.5 F (36.4 C) (Oral)   Resp 16   Wt 175 lb (79.4 kg)   SpO2 98%   BMI 25.11 kg/m  Physical Exam  Constitutional: He is oriented to person, place, and time. He appears well-developed and well-nourished.  HENT:  Head: Normocephalic and atraumatic.  Eyes: Conjunctivae and EOM are normal.  Neck: Normal range of motion.  Neurological: He is alert and oriented to person, place, and time.  Psychiatric: He has a normal mood and affect. His speech is normal and behavior is normal.       Assessment & Plan:     1. 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 He also does not want to have to have another sleep study as he declines a CPAP again. He also only keeps his CDL as a back up in case something were to ever happen to his current  employment. 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        Mar Daring, PA-C  [facility]

## 2018-03-11 ENCOUNTER — Other Ambulatory Visit: Payer: Self-pay | Admitting: Physician Assistant

## 2018-03-11 DIAGNOSIS — E119 Type 2 diabetes mellitus without complications: Secondary | ICD-10-CM

## 2018-03-26 ENCOUNTER — Other Ambulatory Visit: Payer: BLUE CROSS/BLUE SHIELD

## 2018-03-26 ENCOUNTER — Other Ambulatory Visit: Payer: Self-pay | Admitting: Physician Assistant

## 2018-03-26 DIAGNOSIS — F418 Other specified anxiety disorders: Secondary | ICD-10-CM

## 2018-03-29 ENCOUNTER — Ambulatory Visit: Payer: BLUE CROSS/BLUE SHIELD | Admitting: Oncology

## 2018-03-31 ENCOUNTER — Ambulatory Visit (INDEPENDENT_AMBULATORY_CARE_PROVIDER_SITE_OTHER): Payer: BLUE CROSS/BLUE SHIELD | Admitting: Physician Assistant

## 2018-03-31 ENCOUNTER — Encounter: Payer: Self-pay | Admitting: Physician Assistant

## 2018-03-31 VITALS — BP 110/60 | HR 72 | Temp 98.0°F | Resp 16 | Ht 70.0 in | Wt 174.2 lb

## 2018-03-31 DIAGNOSIS — E119 Type 2 diabetes mellitus without complications: Secondary | ICD-10-CM | POA: Diagnosis not present

## 2018-03-31 DIAGNOSIS — G4733 Obstructive sleep apnea (adult) (pediatric): Secondary | ICD-10-CM | POA: Diagnosis not present

## 2018-03-31 DIAGNOSIS — Z125 Encounter for screening for malignant neoplasm of prostate: Secondary | ICD-10-CM | POA: Diagnosis not present

## 2018-03-31 DIAGNOSIS — D508 Other iron deficiency anemias: Secondary | ICD-10-CM | POA: Diagnosis not present

## 2018-03-31 DIAGNOSIS — E7800 Pure hypercholesterolemia, unspecified: Secondary | ICD-10-CM

## 2018-03-31 DIAGNOSIS — Z0000 Encounter for general adult medical examination without abnormal findings: Secondary | ICD-10-CM | POA: Diagnosis not present

## 2018-03-31 LAB — POCT UA - MICROALBUMIN: Microalbumin Ur, POC: NEGATIVE mg/L

## 2018-03-31 NOTE — Patient Instructions (Signed)

## 2018-03-31 NOTE — Progress Notes (Signed)
Patient: Joseph Terry, Male    DOB: [birth year], 59 y.o.   MRN: [id number] Visit Date: 03/31/2018  Today's Provider: Mar Daring, PA-C   Chief Complaint  Patient presents with  . Annual Exam   Subjective:    Annual physical exam Joseph Terry is a 57 y.o. male who presents today for health maintenance and complete physical. He feels well. He reports exercising some. He reports he is sleeping well. -----------------------------------------------------------------   Review of Systems  Constitutional: Negative.   HENT: Negative.   Eyes: Negative.   Respiratory: Negative.   Cardiovascular: Negative.   Gastrointestinal: Negative.   Endocrine: Negative.   Genitourinary: Negative.   Musculoskeletal: Negative.   Skin: Negative.   Allergic/Immunologic: Negative.   Neurological: Negative.   Hematological: Negative.   Psychiatric/Behavioral: Negative.     Social History      He  reports that he quit smoking about 6 years ago. His smoking use included cigarettes. He has a 52.50 pack-year smoking history. He has never used smokeless tobacco. He reports that he drinks about 0.6 oz of alcohol per week. He reports that he does not use drugs.       Social History   Socioeconomic History  . Marital status: Divorced    Spouse name: Not on file  . Number of children: 1  . Years of education: 44  . Highest education level: Not on file  Occupational History  . 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 Financial resource strain: Not on file  . Food insecurity:    Worry: Not on file    Inability: Not on file  . Transportation needs:    Medical: Not on file    Non-medical: Not on file  Tobacco Use  . Smoking status: Former Smoker    Packs/day: 1.50    Years: 35.00    Pack years: 52.50    Types: Cigarettes    Last attempt to quit: 11/14/2011    Years since quitting: 6.3  . Smokeless tobacco: Never Used  .  Tobacco comment: quit 2 weeks prior to CABG 12/2011  Substance and Sexual Activity  . Alcohol use: Yes    Alcohol/week: 0.6 oz    Types: 1 Cans of beer per week    Comment: Drinks once per month; beer.  . Drug use: No  . Sexual activity: Yes    Partners: Female    Birth control/protection: Condom  Lifestyle  . Physical activity:    Days per week: Not on file    Minutes per session: Not on file  . Stress: Not on file  Relationships  . Social connections:    Talks on phone: Not on file    Gets together: Not on file    Attends religious service: Not on file    Active member of club or organization: Not on file    Attends meetings of clubs or organizations: Not on file    Relationship status: Not on file  Other Topics Concern  . Not on file  Social History Narrative   Marital status: divorced since 1996; dating casually in 2016.        Lives: Lives alone.          Children:  One son and a granddaughter 52 years old live in [place].       Tobacco: quit with CABG      Alcohol:  weekends; 6-8 beers on average.  Beers with football; ten on football weekends.      Exercise: Light, yard work.        Employment:  Works 45-50 hrs. Some weeks.  [employer].  Desk work since 05/2013. Since 1979.      Guns:  Guns in the home not stored in locked cabinet. Smoke alarm in home, Always wears seatbelts.       Sexual History: active;Last HIV testing 2015; has has 25+ partners; females only.             Past Medical History:  Diagnosis Date  . Anxiety state, unspecified   . Barrett's esophagus   . CAD (coronary artery disease), severe multivessel CAD surgical consult for CABG    a. 12/2011 Cath: 3 vessel CAD with normal EF;  b. 12/2011 CABG x 5: LIMA to LAD, SVG to distal RCA with sequential SVG to PDA, SVG to OM, and SVG to D2;  c. 03/2014 ETT: Ex time 6:21, 62mm horizontal ST dep in V3-V6, HTN response.  . Depression   . Diaphragmatic hernia without mention of obstruction or gangrene   .  Diverticulosis   . GERD (gastroesophageal reflux disease)   . Hyperlipidemia   . Hypertension   . Impotence of organic origin   . Internal hemorrhoids without mention of complication   . Iron deficiency anemia due to chronic blood loss 04/15/2017  . Kidney stone   . Motion sickness    cars, boats  . OSA on CPAP    a. Sleep study 07/2013 +sever OSA; CPAP at 10cm recommended.  . Personal history of colonic polyps   . Type II diabetes mellitus ([place])   . Wears contact lenses      Patient Active Problem List   Diagnosis Date Noted  . Blood loss anemia   . Polyp of sigmoid colon   . Iron deficiency anemia due to chronic blood loss 04/15/2017  . Type 2 diabetes mellitus without complication, without long-term current use of insulin ([place]) 09/26/2016  . Anemia 12/26/2013  . Barrett's esophagus 12/01/2013  . Personal history of colonic polyps 10/19/2013  . OSA on CPAP 09/20/2013  . GERD (gastroesophageal reflux disease) 02/28/2013  . Coronary artery disease 08/30/2012  . Depression with anxiety 05/31/2012  . Hypertension   . Hyperlipidemia   . History of tobacco abuse 01/06/2012  . S/P CABG x 5 01/05/2012  . Unstable angina ([place]) 01/02/2012  . Cardiovascular stress test abnormal 01/02/2012  . CAD at cath, Nl LVF, severe multivessel CAD surgical consult for CABG 01/02/2012    Past Surgical History:  Procedure Laterality Date  . CARDIAC CATHETERIZATION  4 /19/ 2013   [facility]  11/03/13  . COLONOSCOPY  11/01/13   multiple polyps; diverticulosis. [facility]. Repeat 5 years.  . COLONOSCOPY W/ POLYPECTOMY  09/16/2007   colon polyps.  Repeat 5 years. [facility].  . COLONOSCOPY WITH PROPOFOL N/A 06/04/2017   Procedure: COLONOSCOPY WITH PROPOFOL;  Surgeon: [name], MD;  Location: [place] [facility];  Service: Gastroenterology;  Laterality: N/A;  needs to remain first patient stated he also suppose to get EGD also  . CORONARY ARTERY BYPASS GRAFT  01/05/2012    Procedure: CORONARY ARTERY BYPASS GRAFTING (CABG);  Surgeon: Rexene Alberts, MD;  Location: [facility];  Service: Open Heart Surgery;  Laterality: N/A;  coronary artery bypass graft times five using left internal mammary artery and right leg greater saphenous vein harvested endoscopically   .  ESOPHAGOGASTRODUODENOSCOPY N/A 06/04/2017   PT NEEDS REPEAT IN 06/2020/BARRETT'S ESOPHAGUS - Surgeon: [name], MD  . GIVENS CAPSULE STUDY N/A 06/22/2017   Procedure: GIVENS CAPSULE STUDY;  Surgeon: [name], MD;  [facility]COPY;  Service: Endoscopy;  Laterality: N/A;  . LEFT HEART CATHETERIZATION WITH CORONARY ANGIOGRAM N/A 01/02/2012   Procedure: LEFT HEART CATHETERIZATION WITH CORONARY ANGIOGRAM;  Surgeon: Troy Sine, MD;  Location: [facility] CATH LAB;  Service: Cardiovascular;  Laterality: N/A;  . POLYPECTOMY N/A 06/04/2017   Procedure: POLYPECTOMY;  Surgeon: [name], MD;  Location: [place];  Service: Gastroenterology;  Laterality: N/A;  . POLYSOMNOGRAPHY  07/2013   +severe OSA; CPAP at 10cm pressure recommended.  . rotator cuff curgery  2011   lt rotator cuff  . Shoulder surgery  1997   bilateral  . WISDOM TOOTH EXTRACTION      Family History        Family Status  Relation Name Status  . Mother  Deceased at age 37       lung cancer; Defibrillator; age 87.  . Father  Deceased at age 50       COPD  . Sister  Alive  . MGM  Deceased  . MGF  Deceased  . PGM  Deceased  . PGF  Deceased  . Sister  Alive  . Sister  Alive  . Brother  Alive  . Brother  Alive  . Brother  Alive  . Son  Alive        His family history includes COPD in his father; Cancer in his paternal grandfather and paternal grandmother; Cancer (age of onset: 55) in his father; Cancer (age of onset: 6) in his mother; Diabetes in his mother; Heart attack in his mother; Heart disease in his maternal grandfather, maternal grandmother, and mother; Hyperlipidemia in his father and mother; Hypertension in his  father, mother, sister, and sister.      Allergies  Allergen Reactions  . Penicillins Itching    tingling  . Tramadol Rash     Current Outpatient Medications:  .  albuterol (PROVENTIL HFA;VENTOLIN HFA) 108 (90 Base) MCG/ACT inhaler, Inhale 2 puffs into the lungs every 6 (six) hours as needed for wheezing or shortness of breath. (Patient not taking: Reported on 03/02/2018), Disp: 1 Inhaler, Rfl: 0 .  atorvastatin (LIPITOR) 40 MG tablet, TAKE 1 TABLET(40 MG) BY MOUTH DAILY AT 6 PM, Disp: 90 tablet, Rfl: 1 .  clonazePAM (KLONOPIN) 1 MG tablet, TAKE 1 TABLET BY MOUTH TWICE DAILY AS NEEDED, Disp: 60 tablet, Rfl: 5 .  FARXIGA 10 MG TABS tablet, TAKE 1 TABLET BY MOUTH DAILY, Disp: 90 tablet, Rfl: 1 .  ferrous sulfate 325 (65 FE) MG EC tablet, Take 1 tablet (325 mg total) by mouth 2 (two) times daily with a meal., Disp: 60 tablet, Rfl: 3 .  glipiZIDE (GLUCOTROL XL) 10 MG 24 hr tablet, TAKE 1 TABLET BY MOUTH EVERY DAY WITH BREAKFAST, Disp: 90 tablet, Rfl: 3 .  metFORMIN (GLUCOPHAGE) 1000 MG tablet, TAKE 1 TABLET(1000 MG) BY MOUTH TWICE DAILY WITH A MEAL, Disp: 180 tablet, Rfl: 1 .  metoprolol tartrate (LOPRESSOR) 25 MG tablet, TAKE 1 TABLET(25 MG) BY MOUTH TWICE DAILY, Disp: 180 tablet, Rfl: 1 .  omeprazole (PRILOSEC) 40 MG capsule, Take 40 mg by mouth daily., Disp: , Rfl:  .  sitaGLIPtin (JANUVIA) 100 MG tablet, TAKE 1 TABLET(100 MG) BY MOUTH DAILY, Disp: 90 tablet, Rfl: 1 .  venlafaxine XR (EFFEXOR-XR) 150 MG 24 hr  capsule, TAKE 1 CAPSULE(150 MG) BY MOUTH DAILY WITH BREAKFAST, Disp: 90 capsule, Rfl: 1   Patient Care Team: Mar Daring, PA-C as PCP - General (Family Medicine) Terance Ice, MD as Consulting Physician (Cardiology) [name], MD as Consulting Physician (Family Medicine) Christene Lye, MD (General Surgery) Cristy Friedlander, MD (Oral Surgery)      Objective:   Vitals: BP 110/60 (BP Location: Left Arm, Patient Position: Sitting, Cuff Size: Normal)    Pulse 72   Temp 98 F (36.7 C) (Oral)   Resp 16   Ht 5\' 10"  (1.778 m)   Wt 174 lb 3.2 oz (79 kg)   SpO2 99%   BMI 25.00 kg/m    Vitals:   03/31/18 0859  BP: 110/60  Pulse: 72  Resp: 16  Temp: 98 F (36.7 C)  TempSrc: Oral  SpO2: 99%  Weight: 174 lb 3.2 oz (79 kg)  Height: 5\' 10"  (1.778 m)     Physical Exam  Constitutional: He is oriented to person, place, and time. He appears well-developed and well-nourished.  HENT:  Head: Normocephalic and atraumatic.  Right Ear: Hearing, tympanic membrane, external ear and ear canal normal.  Left Ear: Hearing, tympanic membrane, external ear and ear canal normal.  Nose: Nose normal.  Mouth/Throat: Uvula is midline, oropharynx is clear and moist and mucous membranes are normal.  Eyes: Pupils are equal, round, and reactive to light. Conjunctivae and EOM are normal. Right eye exhibits no discharge.  Neck: Normal range of motion. Neck supple. Carotid bruit is not present. No tracheal deviation present. No thyromegaly present.  Cardiovascular: Normal rate, regular rhythm, normal heart sounds and intact distal pulses.  No murmur heard. Pulses:      Dorsalis pedis pulses are 2+ on the right side, and 2+ on the left side.       Posterior tibial pulses are 2+ on the right side, and 2+ on the left side.  Pulmonary/Chest: Effort normal and breath sounds normal. No respiratory distress. He has no wheezes. He has no rales. He exhibits no tenderness.  Abdominal: Soft. Bowel sounds are normal. He exhibits no distension and no mass. There is no tenderness. There is no rebound and no guarding.  Musculoskeletal: Normal range of motion. He exhibits no edema or tenderness.       Right foot: There is normal range of motion and no deformity.       Left foot: There is normal range of motion and no deformity.  Feet:  Right Foot:  Protective Sensation: 10 sites tested. 10 sites sensed.  Skin Integrity: Negative for ulcer, blister, skin breakdown, erythema,  warmth, callus or dry skin.  Left Foot:  Protective Sensation: 10 sites tested. 10 sites sensed.  Skin Integrity: Negative for ulcer, blister, skin breakdown, erythema, warmth, callus or dry skin.  Lymphadenopathy:    He has no cervical adenopathy.  Neurological: He is alert and oriented to person, place, and time. He has normal reflexes. He displays normal reflexes. No cranial nerve deficit. He exhibits normal muscle tone. Coordination normal.  Skin: Skin is warm and dry. No rash noted. No erythema.  Psychiatric: He has a normal mood and affect. His behavior is normal. Judgment and thought content normal.     Depression Screen PHQ 2/9 Scores 03/31/2018 12/26/2016 03/25/2016 10/05/2015  PHQ - 2 Score 2 0 0 0  PHQ- 9 Score 7 1 - -      Assessment & Plan:     Routine Health  Maintenance and Physical Exam  Exercise Activities and Dietary recommendations Goals    None      Immunization History  Administered Date(s) Administered  . Hepatitis B, adult 06/18/2015, 10/05/2015  . Pneumococcal Conjugate-13 08/14/2014  . Pneumococcal Polysaccharide-23 06/18/2015  . Pneumococcal-Unspecified 09/15/2004  . Td 09/16/2007    Health Maintenance  Topic Date Due  . FOOT EXAM  03/25/2017  . URINE MICROALBUMIN  03/25/2017  . TETANUS/TDAP  09/15/2017  . HEMOGLOBIN A1C  12/30/2017  . INFLUENZA VACCINE  04/15/2018  . OPHTHALMOLOGY EXAM  12/01/2018  . COLONOSCOPY  06/04/2022  . PNEUMOCOCCAL POLYSACCHARIDE VACCINE  Completed  . Hepatitis C Screening  Completed  . HIV Screening  Completed     Discussed health benefits of physical activity, and encouraged him to engage in regular exercise appropriate for his age and condition.   1. Annual physical exam Normal physical exam today. Will check labs as below and f/u pending lab results. If labs are stable and WNL he will not need to have these rechecked for one year at his next annual physical exam. He is to call the office in the meantime if he  has any acute issue, questions or concerns. - CBC with Differential/Platelet - Comprehensive metabolic panel - TSH  2. Type 2 diabetes mellitus without complication, without long-term current use of insulin (HCC) Stable. Microalbumin normal. Continue Metformin 1000mg  BID, Januvia 100mg  daily, farxiga 10mg  daily and glipizide XR 10mg  daily. I will see him back in 3 months for recheck.  - Hemoglobin A1c - POCT UA - Microalbumin  3. Hypercholesterolemia Continue atorvastatin 40mg . Will check labs as below and f/u pending results. - Lipid panel  4. Prostate cancer screening Will check labs as below and f/u pending results. - PSA  5. Iron deficiency anemia secondary to inadequate dietary iron intake H/O this that required iron infusions. Will check labs as below and f/u pending results. - Fe+TIBC+Fer  6. Obstructive sleep apnea syndrome Does not use CPAP machine, not used since 2017. Reports it was no help and causing him less sleep. Doing well without he feels.    --------------------------------------------------------------------    Mar Daring, PA-C  [facility]

## 2018-04-01 ENCOUNTER — Encounter: Payer: Self-pay | Admitting: Physician Assistant

## 2018-04-01 ENCOUNTER — Other Ambulatory Visit: Payer: Self-pay | Admitting: Physician Assistant

## 2018-04-01 DIAGNOSIS — D508 Other iron deficiency anemias: Secondary | ICD-10-CM

## 2018-04-01 LAB — HEMOGLOBIN A1C
Est. average glucose Bld gHb Est-mCnc: 140 mg/dL
Hgb A1c MFr Bld: 6.5 % — ABNORMAL HIGH (ref 4.8–5.6)

## 2018-04-01 LAB — COMPREHENSIVE METABOLIC PANEL
ALBUMIN: 4.6 g/dL (ref 3.5–5.5)
ALT: 28 IU/L (ref 0–44)
AST: 17 IU/L (ref 0–40)
Albumin/Globulin Ratio: 2.2 (ref 1.2–2.2)
Alkaline Phosphatase: 98 IU/L (ref 39–117)
BUN/Creatinine Ratio: 13 (ref 9–20)
BUN: 10 mg/dL (ref 6–24)
Bilirubin Total: 0.3 mg/dL (ref 0.0–1.2)
CO2: 19 mmol/L — ABNORMAL LOW (ref 20–29)
Calcium: 9 mg/dL (ref 8.7–10.2)
Chloride: 99 mmol/L (ref 96–106)
Creatinine, Ser: 0.77 mg/dL (ref 0.76–1.27)
GFR, EST AFRICAN AMERICAN: 116 mL/min/{1.73_m2} (ref >59)
GFR, EST NON AFRICAN AMERICAN: 100 mL/min/{1.73_m2} (ref >59)
GLUCOSE: 113 mg/dL — AB (ref 65–99)
Globulin, Total: 2.1 g/dL (ref 1.5–4.5)
Potassium: 4.4 mmol/L (ref 3.5–5.2)
Sodium: 136 mmol/L (ref 134–144)
TOTAL PROTEIN: 6.7 g/dL (ref 6.0–8.5)

## 2018-04-01 LAB — IRON,TIBC AND FERRITIN PANEL
Ferritin: 17 ng/mL — ABNORMAL LOW (ref 30–400)
IRON: 44 ug/dL (ref 38–169)
Iron Saturation: 13 % — ABNORMAL LOW (ref 15–55)
Total Iron Binding Capacity: 342 ug/dL (ref 250–450)
UIBC: 298 ug/dL (ref 111–343)

## 2018-04-01 LAB — CBC WITH DIFFERENTIAL/PLATELET
BASOS ABS: 0 10*3/uL (ref 0.0–0.2)
BASOS: 0 %
EOS (ABSOLUTE): 0.8 10*3/uL — AB (ref 0.0–0.4)
Eos: 8 %
HEMOGLOBIN: 11.5 g/dL — AB (ref 13.0–17.7)
Hematocrit: 37.5 % (ref 37.5–51.0)
IMMATURE GRANS (ABS): 0 10*3/uL (ref 0.0–0.1)
IMMATURE GRANULOCYTES: 0 %
Lymphocytes Absolute: 1 10*3/uL (ref 0.7–3.1)
Lymphs: 9 %
MCH: 23.9 pg — ABNORMAL LOW (ref 26.6–33.0)
MCHC: 30.7 g/dL — ABNORMAL LOW (ref 31.5–35.7)
MCV: 78 fL — ABNORMAL LOW (ref 79–97)
MONOCYTES: 5 %
Monocytes Absolute: 0.5 10*3/uL (ref 0.1–0.9)
NEUTROS PCT: 78 %
Neutrophils Absolute: 8.1 10*3/uL — ABNORMAL HIGH (ref 1.4–7.0)
PLATELETS: 345 10*3/uL (ref 150–450)
RBC: 4.82 x10E6/uL (ref 4.14–5.80)
RDW: 17.2 % — ABNORMAL HIGH (ref 12.3–15.4)
WBC: 10.5 10*3/uL (ref 3.4–10.8)

## 2018-04-01 LAB — LIPID PANEL
CHOLESTEROL TOTAL: 97 mg/dL — AB (ref 100–199)
Chol/HDL Ratio: 3.6 ratio (ref 0.0–5.0)
HDL: 27 mg/dL — ABNORMAL LOW (ref >39)
LDL CALC: 38 mg/dL (ref 0–99)
TRIGLYCERIDES: 160 mg/dL — AB (ref 0–149)
VLDL CHOLESTEROL CAL: 32 mg/dL (ref 5–40)

## 2018-04-01 LAB — PSA: Prostate Specific Ag, Serum: 0.4 ng/mL (ref 0.0–4.0)

## 2018-04-01 LAB — TSH: TSH: 2.2 u[IU]/mL (ref 0.450–4.500)

## 2018-04-01 MED ORDER — FERROUS SULFATE 325 (65 FE) MG PO TBEC: 325.0000 mg | DELAYED_RELEASE_TABLET | Freq: Three times a day (TID) | ORAL | 3 refills | Status: DC

## 2018-04-15 ENCOUNTER — Other Ambulatory Visit: Payer: Self-pay | Admitting: Physician Assistant

## 2018-04-15 DIAGNOSIS — E119 Type 2 diabetes mellitus without complications: Secondary | ICD-10-CM

## 2018-04-26 DIAGNOSIS — M9901 Segmental and somatic dysfunction of cervical region: Secondary | ICD-10-CM | POA: Diagnosis not present

## 2018-04-26 DIAGNOSIS — M5441 Lumbago with sciatica, right side: Secondary | ICD-10-CM | POA: Diagnosis not present

## 2018-04-26 DIAGNOSIS — M5033 Other cervical disc degeneration, cervicothoracic region: Secondary | ICD-10-CM | POA: Diagnosis not present

## 2018-04-26 DIAGNOSIS — M9903 Segmental and somatic dysfunction of lumbar region: Secondary | ICD-10-CM | POA: Diagnosis not present

## 2018-05-06 DIAGNOSIS — M9901 Segmental and somatic dysfunction of cervical region: Secondary | ICD-10-CM | POA: Diagnosis not present

## 2018-05-06 DIAGNOSIS — M5033 Other cervical disc degeneration, cervicothoracic region: Secondary | ICD-10-CM | POA: Diagnosis not present

## 2018-05-06 DIAGNOSIS — M5441 Lumbago with sciatica, right side: Secondary | ICD-10-CM | POA: Diagnosis not present

## 2018-05-06 DIAGNOSIS — M9903 Segmental and somatic dysfunction of lumbar region: Secondary | ICD-10-CM | POA: Diagnosis not present

## 2018-05-10 DIAGNOSIS — M5033 Other cervical disc degeneration, cervicothoracic region: Secondary | ICD-10-CM | POA: Diagnosis not present

## 2018-05-10 DIAGNOSIS — M9901 Segmental and somatic dysfunction of cervical region: Secondary | ICD-10-CM | POA: Diagnosis not present

## 2018-05-10 DIAGNOSIS — M5441 Lumbago with sciatica, right side: Secondary | ICD-10-CM | POA: Diagnosis not present

## 2018-05-10 DIAGNOSIS — M9903 Segmental and somatic dysfunction of lumbar region: Secondary | ICD-10-CM | POA: Diagnosis not present

## 2018-05-18 ENCOUNTER — Other Ambulatory Visit: Payer: Self-pay | Admitting: Physician Assistant

## 2018-05-18 DIAGNOSIS — E7800 Pure hypercholesterolemia, unspecified: Secondary | ICD-10-CM

## 2018-05-19 ENCOUNTER — Other Ambulatory Visit: Payer: Self-pay | Admitting: Physician Assistant

## 2018-05-19 DIAGNOSIS — F418 Other specified anxiety disorders: Secondary | ICD-10-CM

## 2018-05-24 NOTE — Telephone Encounter (Signed)
Pt called for a status update on his refill request for clonazePAM (KLONOPIN) 1 MG tablet. Pt was advised that Tawanna Sat was out of the office last week. Pt stated he understands but is out of the medication and request that this be sent in today if possible. Looks like Rx was sent in while I was documenting call. Thanks TNP

## 2018-05-24 NOTE — Telephone Encounter (Signed)
Pt advised that Rx was sent to pharmacy. Thanks TNP

## 2018-05-24 NOTE — Telephone Encounter (Signed)
Pt is still waiting for a refill on his Clonazepam 1 mg 2 times a day  Meriel Pica  Pt's CB#  [phone]  Thanks,  Con Memos

## 2018-06-16 DIAGNOSIS — M5033 Other cervical disc degeneration, cervicothoracic region: Secondary | ICD-10-CM | POA: Diagnosis not present

## 2018-06-16 DIAGNOSIS — M9903 Segmental and somatic dysfunction of lumbar region: Secondary | ICD-10-CM | POA: Diagnosis not present

## 2018-06-16 DIAGNOSIS — M5441 Lumbago with sciatica, right side: Secondary | ICD-10-CM | POA: Diagnosis not present

## 2018-06-16 DIAGNOSIS — M9901 Segmental and somatic dysfunction of cervical region: Secondary | ICD-10-CM | POA: Diagnosis not present

## 2018-07-01 ENCOUNTER — Encounter: Payer: Self-pay | Admitting: Physician Assistant

## 2018-07-01 ENCOUNTER — Ambulatory Visit: Payer: BLUE CROSS/BLUE SHIELD | Admitting: Physician Assistant

## 2018-07-01 VITALS — BP 130/70 | HR 63 | Resp 16 | Wt 175.8 lb

## 2018-07-01 DIAGNOSIS — E119 Type 2 diabetes mellitus without complications: Secondary | ICD-10-CM

## 2018-07-01 DIAGNOSIS — Z2821 Immunization not carried out because of patient refusal: Secondary | ICD-10-CM

## 2018-07-01 DIAGNOSIS — D500 Iron deficiency anemia secondary to blood loss (chronic): Secondary | ICD-10-CM

## 2018-07-01 LAB — POCT GLYCOSYLATED HEMOGLOBIN (HGB A1C)
ESTIMATED AVERAGE GLUCOSE: 169
Hemoglobin A1C: 7.5 % — AB (ref 4.0–5.6)

## 2018-07-01 NOTE — Progress Notes (Signed)
Patient: Joseph Terry Male    DOB: 1961-08-15   57 y.o.   MRN: [id number] Visit Date: 07/01/2018  Today's Provider: Mar Daring, PA-C   Chief Complaint  Patient presents with  . Follow-up    DM   Subjective:    HPI  Diabetes Mellitus Type II, Follow-up:   Lab Results  Component Value Date   HGBA1C 7.5 (A) 07/01/2018   HGBA1C 6.5 (H) 03/31/2018   HGBA1C 6.9 10/01/2017    Last seen for diabetes 3 months ago.  Management since then includes continue metformin, glipizide, januvia, and farxiga as directed He reports excellent compliance with treatment. He is not having side effects.  Current symptoms include none and have been stable. Home blood sugar records: not checking  Weight trend: stable Current diet: in general, a "healthy" diet   Current exercise: none   Pertinent Labs:    Component Value Date/Time   CHOL 97 (L) 03/31/2018 0957   TRIG 160 (H) 03/31/2018 0957   HDL 27 (L) 03/31/2018 0957   LDLCALC 38 03/31/2018 0957   CREATININE 0.77 03/31/2018 0957   CREATININE 0.76 06/18/2015 0821   ------------------------------------------------------------------------     Allergies  Allergen Reactions  . Penicillins Itching    tingling  . Tramadol Rash     Current Outpatient Medications:  .  atorvastatin (LIPITOR) 40 MG tablet, TAKE 1 TABLET(40 MG) BY MOUTH DAILY AT 6 PM, Disp: 90 tablet, Rfl: 1 .  atorvastatin (LIPITOR) 40 MG tablet, TAKE 1 TABLET BY MOUTH DAILY AT 6PM, Disp: 90 tablet, Rfl: 1 .  clonazePAM (KLONOPIN) 1 MG tablet, TAKE 1 TABLET BY MOUTH TWICE DAILY AS NEEDED, Disp: 60 tablet, Rfl: 5 .  FARXIGA 10 MG TABS tablet, TAKE 1 TABLET BY MOUTH DAILY, Disp: 90 tablet, Rfl: 1 .  ferrous sulfate 325 (65 FE) MG EC tablet, Take 1 tablet (325 mg total) by mouth 3 (three) times daily with meals., Disp: 90 tablet, Rfl: 3 .  glipiZIDE (GLUCOTROL XL) 10 MG 24 hr tablet, TAKE 1 TABLET BY MOUTH EVERY DAY WITH BREAKFAST, Disp: 90 tablet, Rfl: 3 .   metFORMIN (GLUCOPHAGE) 1000 MG tablet, TAKE 1 TABLET(1000 MG) BY MOUTH TWICE DAILY WITH A MEAL, Disp: 180 tablet, Rfl: 1 .  metoprolol tartrate (LOPRESSOR) 25 MG tablet, TAKE 1 TABLET(25 MG) BY MOUTH TWICE DAILY, Disp: 180 tablet, Rfl: 1 .  omeprazole (PRILOSEC) 40 MG capsule, Take 40 mg by mouth daily., Disp: , Rfl:  .  sitaGLIPtin (JANUVIA) 100 MG tablet, TAKE 1 TABLET(100 MG) BY MOUTH DAILY, Disp: 90 tablet, Rfl: 1 .  venlafaxine XR (EFFEXOR-XR) 150 MG 24 hr capsule, TAKE 1 CAPSULE(150 MG) BY MOUTH DAILY WITH BREAKFAST, Disp: 90 capsule, Rfl: 1 .  albuterol (PROVENTIL HFA;VENTOLIN HFA) 108 (90 Base) MCG/ACT inhaler, Inhale 2 puffs into the lungs every 6 (six) hours as needed for wheezing or shortness of breath. (Patient not taking: Reported on 03/02/2018), Disp: 1 Inhaler, Rfl: 0  Review of Systems  Constitutional: Negative.   Respiratory: Negative.   Cardiovascular: Negative.   Gastrointestinal: Negative.   Endocrine: Negative.     Social History   Tobacco Use  . Smoking status: Former Smoker    Packs/day: 1.50    Years: 35.00    Pack years: 52.50    Types: Cigarettes    Last attempt to quit: 11/14/2011    Years since quitting: 6.6  . Smokeless tobacco: Never Used  . Tobacco comment: quit 2 weeks prior  to CABG 12/2011  Substance Use Topics  . Alcohol use: Yes    Alcohol/week: 1.0 standard drinks    Types: 1 Cans of beer per week    Comment: Drinks once per month; beer.   Objective:   BP 130/70 (BP Location: Left Arm, Patient Position: Sitting, Cuff Size: Normal)   Pulse 63   Resp 16   Wt 175 lb 12.8 oz (79.7 kg)   SpO2 98%   BMI 25.22 kg/m  Vitals:   07/01/18 0817  BP: 130/70  Pulse: 63  Resp: 16  SpO2: 98%  Weight: 175 lb 12.8 oz (79.7 kg)     Physical Exam  Constitutional: He appears well-developed and well-nourished. No distress.  HENT:  Head: Normocephalic and atraumatic.  Neck: Normal range of motion. Neck supple.  Cardiovascular: Normal rate, regular  rhythm and normal heart sounds. Exam reveals no gallop and no friction rub.  No murmur heard. Pulmonary/Chest: Effort normal and breath sounds normal. No respiratory distress. He has no wheezes. He has no rales.  Skin: He is not diaphoretic.  Vitals reviewed.      Assessment & Plan:     1. Type 2 diabetes mellitus without complication, without long-term current use of insulin (HCC) A1c up to 7.5 from 6.5. Patient admits to poor dietary habits recently. Will make lifestyle changes. Continue metformin, januvia, glipizide and farxiga. I will see him back in 3 months for recheck.  - POCT glycosylated hemoglobin (Hb A1C)  2. Influenza vaccination declined  3. Iron deficiency anemia due to chronic blood loss H/O this. On ferrous sulfate 325mg  TID. Will check labs as below and f/u pending results. - Fe+TIBC+Fer - CBC with Differential/Platelet       Mar Daring, PA-C  [facility]

## 2018-07-02 LAB — CBC WITH DIFFERENTIAL/PLATELET
Basophils Absolute: 0.1 10*3/uL (ref 0.0–0.2)
Basos: 2 %
EOS (ABSOLUTE): 0.4 10*3/uL (ref 0.0–0.4)
EOS: 8 %
HEMOGLOBIN: 15.4 g/dL (ref 13.0–17.7)
Hematocrit: 49.4 % (ref 37.5–51.0)
IMMATURE GRANS (ABS): 0 10*3/uL (ref 0.0–0.1)
IMMATURE GRANULOCYTES: 0 %
LYMPHS: 22 %
Lymphocytes Absolute: 1.3 10*3/uL (ref 0.7–3.1)
MCH: 25 pg — ABNORMAL LOW (ref 26.6–33.0)
MCHC: 31.2 g/dL — ABNORMAL LOW (ref 31.5–35.7)
MCV: 80 fL (ref 79–97)
Monocytes Absolute: 0.3 10*3/uL (ref 0.1–0.9)
Monocytes: 5 %
NEUTROS ABS: 3.8 10*3/uL (ref 1.4–7.0)
NEUTROS PCT: 63 %
PLATELETS: 314 10*3/uL (ref 150–450)
RBC: 6.16 x10E6/uL — ABNORMAL HIGH (ref 4.14–5.80)
RDW: 19.8 % — AB (ref 12.3–15.4)
WBC: 5.9 10*3/uL (ref 3.4–10.8)

## 2018-07-02 LAB — IRON,TIBC AND FERRITIN PANEL
Ferritin: 35 ng/mL (ref 30–400)
Iron Saturation: 22 % (ref 15–55)
Iron: 65 ug/dL (ref 38–169)
Total Iron Binding Capacity: 301 ug/dL (ref 250–450)
UIBC: 236 ug/dL (ref 111–343)

## 2018-07-30 ENCOUNTER — Ambulatory Visit: Payer: BLUE CROSS/BLUE SHIELD | Admitting: Physician Assistant

## 2018-07-30 ENCOUNTER — Encounter: Payer: Self-pay | Admitting: Physician Assistant

## 2018-07-30 VITALS — BP 130/70 | HR 74 | Temp 97.8°F | Resp 16 | Wt 173.0 lb

## 2018-07-30 DIAGNOSIS — J029 Acute pharyngitis, unspecified: Secondary | ICD-10-CM

## 2018-07-30 LAB — POCT RAPID STREP A (OFFICE): Rapid Strep A Screen: NEGATIVE

## 2018-07-30 NOTE — Progress Notes (Signed)
Patient: Joseph Terry Male    DOB: 10-Sep-1961   57 y.o.   MRN: [id number] Visit Date: 07/30/2018  Today's Provider: Trinna Post, PA-C   Chief Complaint  Patient presents with  . URI   Subjective:    HPI Upper Respiratory Infection: Patient complains of symptoms of a URI. Symptoms include bilateral ear pain, cough and sore throat. Onset of symptoms was 4 days ago, gradually worsening since that time. He also c/o bilateral ear pressure/pain, achiness, congestion, non productive cough and sore throat for the past 1 day .  He is drinking plenty of fluids. Evaluation to date: none. Treatment to date: cough suppressants and decongestants. Concerned about strep sore throat.      Allergies  Allergen Reactions  . Penicillins Itching    tingling  . Tramadol Rash     Current Outpatient Medications:  .  atorvastatin (LIPITOR) 40 MG tablet, TAKE 1 TABLET(40 MG) BY MOUTH DAILY AT 6 PM, Disp: 90 tablet, Rfl: 1 .  atorvastatin (LIPITOR) 40 MG tablet, TAKE 1 TABLET BY MOUTH DAILY AT 6PM, Disp: 90 tablet, Rfl: 1 .  clonazePAM (KLONOPIN) 1 MG tablet, TAKE 1 TABLET BY MOUTH TWICE DAILY AS NEEDED, Disp: 60 tablet, Rfl: 5 .  FARXIGA 10 MG TABS tablet, TAKE 1 TABLET BY MOUTH DAILY, Disp: 90 tablet, Rfl: 1 .  ferrous sulfate 325 (65 FE) MG EC tablet, Take 1 tablet (325 mg total) by mouth 3 (three) times daily with meals., Disp: 90 tablet, Rfl: 3 .  glipiZIDE (GLUCOTROL XL) 10 MG 24 hr tablet, TAKE 1 TABLET BY MOUTH EVERY DAY WITH BREAKFAST, Disp: 90 tablet, Rfl: 3 .  metFORMIN (GLUCOPHAGE) 1000 MG tablet, TAKE 1 TABLET(1000 MG) BY MOUTH TWICE DAILY WITH A MEAL, Disp: 180 tablet, Rfl: 1 .  metoprolol tartrate (LOPRESSOR) 25 MG tablet, TAKE 1 TABLET(25 MG) BY MOUTH TWICE DAILY, Disp: 180 tablet, Rfl: 1 .  omeprazole (PRILOSEC) 40 MG capsule, Take 40 mg by mouth daily., Disp: , Rfl:  .  sitaGLIPtin (JANUVIA) 100 MG tablet, TAKE 1 TABLET(100 MG) BY MOUTH DAILY, Disp: 90 tablet, Rfl: 1 .   venlafaxine XR (EFFEXOR-XR) 150 MG 24 hr capsule, TAKE 1 CAPSULE(150 MG) BY MOUTH DAILY WITH BREAKFAST, Disp: 90 capsule, Rfl: 1  Review of Systems  Constitutional: Positive for chills.  HENT: Positive for congestion, ear pain and sore throat.   Respiratory: Positive for cough.     Social History   Tobacco Use  . Smoking status: Former Smoker    Packs/day: 1.50    Years: 35.00    Pack years: 52.50    Types: Cigarettes    Last attempt to quit: 11/14/2011    Years since quitting: 6.7  . Smokeless tobacco: Never Used  . Tobacco comment: quit 2 weeks prior to CABG 12/2011  Substance Use Topics  . Alcohol use: Yes    Alcohol/week: 1.0 standard drinks    Types: 1 Cans of beer per week    Comment: Drinks once per month; beer.   Objective:   BP 130/70 (BP Location: Left Arm, Patient Position: Sitting, Cuff Size: Large)   Pulse 74   Temp 97.8 F (36.6 C) (Oral)   Resp 16   Wt 173 lb (78.5 kg)   SpO2 97%   BMI 24.82 kg/m  Vitals:   07/30/18 0849  BP: 130/70  Pulse: 74  Resp: 16  Temp: 97.8 F (36.6 C)  TempSrc: Oral  SpO2: 97%  Weight:  173 lb (78.5 kg)     Physical Exam  Constitutional: He is oriented to person, place, and time. He appears well-developed and well-nourished.  HENT:  Right Ear: External ear normal.  Left Ear: External ear normal.  Nose: Nose normal.  Mouth/Throat: Oropharynx is clear and moist. No oropharyngeal exudate.  Neck: Neck supple.  Cardiovascular: Normal rate and regular rhythm.  Pulmonary/Chest: Effort normal and breath sounds normal.  Lymphadenopathy:    He has no cervical adenopathy.  Neurological: He is alert and oriented to person, place, and time.  Skin: Skin is warm and dry.  Psychiatric: He has a normal mood and affect. His behavior is normal.        Assessment & Plan:     1. Sorethroat  Rapid strep is negative. Counseled regarding signs and symptoms of viral and bacterial respiratory infections. Advised to call or return for  additional evaluation if he develops any sign of bacterial infection, or if current symptoms last longer than 10 days.   - POCT rapid strep A  Return if symptoms worsen or fail to improve.  The entirety of the information documented in the History of Present Illness, Review of Systems and Physical Exam were personally obtained by me. Portions of this information were initially documented by Lynford Humphrey, CMA and reviewed by me for thoroughness and accuracy.         Trinna Post, PA-C  [facility]

## 2018-07-30 NOTE — Patient Instructions (Addendum)
Mucinex PLAIN for mucous production Coricidin for decongestant     Viral Respiratory Infection A viral respiratory infection is an illness that affects parts of the body used for breathing, like the lungs, nose, and throat. It is caused by a germ called a virus. Some examples of this kind of infection are:  A cold.  The flu (influenza).  A respiratory syncytial virus (RSV) infection.  How do I know if I have this infection? Most of the time this infection causes:  A stuffy or runny nose.  Yellow or green fluid in the nose.  A cough.  Sneezing.  Tiredness (fatigue).  Achy muscles.  A sore throat.  Sweating or chills.  A fever.  A headache.  How is this infection treated? If the flu is diagnosed early, it may be treated with an antiviral medicine. This medicine shortens the length of time a person has symptoms. Symptoms may be treated with over-the-counter and prescription medicines, such as:  Expectorants. These make it easier to cough up mucus.  Decongestant nasal sprays.  Doctors do not prescribe antibiotic medicines for viral infections. They do not work with this kind of infection. How do I know if I should stay home? To keep others from getting sick, stay home if you have:  A fever.  A lasting cough.  A sore throat.  A runny nose.  Sneezing.  Muscles aches.  Headaches.  Tiredness.  Weakness.  Chills.  Sweating.  An upset stomach (nausea).  Follow these instructions at home:  Rest as much as possible.  Take over-the-counter and prescription medicines only as told by your doctor.  Drink enough fluid to keep your pee (urine) clear or pale yellow.  Gargle with salt water. Do this 3-4 times per day or as needed. To make a salt-water mixture, dissolve -1 tsp of salt in 1 cup of warm water. Make sure the salt dissolves all the way.  Use nose drops made from salt water. This helps with stuffiness (congestion). It also helps soften the  skin around your nose.  Do not drink alcohol.  Do not use tobacco products, including cigarettes, chewing tobacco, and e-cigarettes. If you need help quitting, ask your doctor. Get help if:  Your symptoms last for 10 days or longer.  Your symptoms get worse over time.  You have a fever.  You have very bad pain in your face or forehead.  Parts of your jaw or neck become very swollen. Get help right away if:  You feel pain or pressure in your chest.  You have shortness of breath.  You faint or feel like you will faint.  You keep throwing up (vomiting).  You feel confused. This information is not intended to replace advice given to you by your health care provider. Make sure you discuss any questions you have with your health care provider. Document Released: 08/14/2008 Document Revised: 02/07/2016 Document Reviewed: 02/07/2015 Elsevier Interactive Patient Education  2018 [employer].

## 2018-08-13 ENCOUNTER — Other Ambulatory Visit: Payer: Self-pay | Admitting: Physician Assistant

## 2018-08-13 DIAGNOSIS — E119 Type 2 diabetes mellitus without complications: Secondary | ICD-10-CM

## 2018-08-15 ENCOUNTER — Other Ambulatory Visit: Payer: Self-pay | Admitting: Physician Assistant

## 2018-08-15 DIAGNOSIS — I10 Essential (primary) hypertension: Secondary | ICD-10-CM

## 2018-09-12 ENCOUNTER — Other Ambulatory Visit: Payer: Self-pay | Admitting: Physician Assistant

## 2018-09-12 DIAGNOSIS — E119 Type 2 diabetes mellitus without complications: Secondary | ICD-10-CM

## 2018-09-13 DIAGNOSIS — M5441 Lumbago with sciatica, right side: Secondary | ICD-10-CM | POA: Diagnosis not present

## 2018-09-13 DIAGNOSIS — M9901 Segmental and somatic dysfunction of cervical region: Secondary | ICD-10-CM | POA: Diagnosis not present

## 2018-09-13 DIAGNOSIS — M5033 Other cervical disc degeneration, cervicothoracic region: Secondary | ICD-10-CM | POA: Diagnosis not present

## 2018-09-13 DIAGNOSIS — M9903 Segmental and somatic dysfunction of lumbar region: Secondary | ICD-10-CM | POA: Diagnosis not present

## 2018-09-24 DIAGNOSIS — M9903 Segmental and somatic dysfunction of lumbar region: Secondary | ICD-10-CM | POA: Diagnosis not present

## 2018-09-24 DIAGNOSIS — M5441 Lumbago with sciatica, right side: Secondary | ICD-10-CM | POA: Diagnosis not present

## 2018-09-24 DIAGNOSIS — M9901 Segmental and somatic dysfunction of cervical region: Secondary | ICD-10-CM | POA: Diagnosis not present

## 2018-09-24 DIAGNOSIS — M5033 Other cervical disc degeneration, cervicothoracic region: Secondary | ICD-10-CM | POA: Diagnosis not present

## 2018-09-28 ENCOUNTER — Other Ambulatory Visit: Payer: Self-pay | Admitting: Physician Assistant

## 2018-09-28 DIAGNOSIS — F418 Other specified anxiety disorders: Secondary | ICD-10-CM

## 2018-09-29 NOTE — Progress Notes (Signed)
Patient: Joseph Terry Male    DOB: [birth year]   58 y.o.   MRN: [id number] Visit Date: 10/01/2018  Today's Provider: Mar Daring, PA-C   Chief Complaint  Patient presents with  . Follow-up  . Diabetes   Subjective:     HPI    Diabetes Mellitus Type II, Follow-up:   Lab Results  Component Value Date   HGBA1C 7.5 (A) 07/01/2018   HGBA1C 6.5 (H) 03/31/2018   HGBA1C 6.9 10/01/2017   Last seen for diabetes 3 months ago.  Management since then includes; advised diet and exercise. He reports good compliance with treatment. He is not having side effects. none Current symptoms include none and have been unchanged. Home blood sugar records: not checking  Episodes of hypoglycemia? no   Current Insulin Regimen: n/a Most Recent Eye Exam: 11/30/2017 Weight trend: stable Prior visit with dietician: no Current diet: in general, a "healthy" diet   Current exercise: none  -----------------------------------------------------------------   Iron deficiency anemia due to chronic blood loss From 07/01/2018-labs checked, reduced ferrous sulfate 325mg  to bid.    Allergies  Allergen Reactions  . Penicillins Itching    tingling  . Tramadol Rash     Current Outpatient Medications:  .  atorvastatin (LIPITOR) 40 MG tablet, TAKE 1 TABLET(40 MG) BY MOUTH DAILY AT 6 PM, Disp: 90 tablet, Rfl: 1 .  clonazePAM (KLONOPIN) 1 MG tablet, TAKE 1 TABLET BY MOUTH TWICE DAILY AS NEEDED, Disp: 60 tablet, Rfl: 5 .  FARXIGA 10 MG TABS tablet, TAKE 1 TABLET BY MOUTH DAILY, Disp: 90 tablet, Rfl: 1 .  ferrous sulfate 325 (65 FE) MG EC tablet, Take 1 tablet (325 mg total) by mouth 3 (three) times daily with meals., Disp: 90 tablet, Rfl: 3 .  glipiZIDE (GLUCOTROL XL) 10 MG 24 hr tablet, TAKE 1 TABLET BY MOUTH EVERY DAY WITH BREAKFAST, Disp: 90 tablet, Rfl: 3 .  metFORMIN (GLUCOPHAGE) 1000 MG tablet, TAKE 1 TABLET(1000 MG) BY MOUTH TWICE DAILY WITH A MEAL, Disp: 180 tablet, Rfl: 1 .   metoprolol tartrate (LOPRESSOR) 25 MG tablet, TAKE 1 TABLET(25 MG) BY MOUTH TWICE DAILY, Disp: 180 tablet, Rfl: 1 .  omeprazole (PRILOSEC) 40 MG capsule, Take 40 mg by mouth daily., Disp: , Rfl:  .  sitaGLIPtin (JANUVIA) 100 MG tablet, TAKE 1 TABLET(100 MG) BY MOUTH DAILY, Disp: 90 tablet, Rfl: 1 .  venlafaxine XR (EFFEXOR-XR) 150 MG 24 hr capsule, TAKE 1 CAPSULE(150 MG) BY MOUTH DAILY WITH BREAKFAST, Disp: 90 capsule, Rfl: 1 .  atorvastatin (LIPITOR) 40 MG tablet, TAKE 1 TABLET BY MOUTH DAILY AT 6PM, Disp: 90 tablet, Rfl: 1  Review of Systems  Constitutional: Negative for appetite change, chills and fever.  Respiratory: Negative for chest tightness, shortness of breath and wheezing.   Cardiovascular: Negative for chest pain and palpitations.  Gastrointestinal: Negative for abdominal pain, nausea and vomiting.  Neurological: Negative.     Social History   Tobacco Use  . Smoking status: Former Smoker    Packs/day: 1.50    Years: 35.00    Pack years: 52.50    Types: Cigarettes    Last attempt to quit: 11/14/2011    Years since quitting: 6.8  . Smokeless tobacco: Never Used  . Tobacco comment: quit 2 weeks prior to CABG 12/2011  Substance Use Topics  . Alcohol use: Yes    Alcohol/week: 1.0 standard drinks    Types: 1 Cans of beer per week    Comment:  Drinks once per month; beer.      Objective:   BP 102/64 (BP Location: Left Arm, Patient Position: Sitting, Cuff Size: Large)   Pulse 61   Temp 97.7 F (36.5 C) (Oral)   Resp 16   Wt 168 lb (76.2 kg)   SpO2 98%   BMI 24.11 kg/m  Vitals:   10/01/18 0818  BP: 102/64  Pulse: 61  Resp: 16  Temp: 97.7 F (36.5 C)  TempSrc: Oral  SpO2: 98%  Weight: 168 lb (76.2 kg)     Physical Exam Vitals signs reviewed.  Constitutional:      General: He is not in acute distress.    Appearance: He is well-developed. He is not diaphoretic.  HENT:     Head: Normocephalic and atraumatic.  Neck:     Musculoskeletal: Normal range of  motion and neck supple.     Thyroid: No thyromegaly.     Vascular: No JVD.     Trachea: No tracheal deviation.  Cardiovascular:     Rate and Rhythm: Normal rate and regular rhythm.     Heart sounds: Normal heart sounds. No murmur. No friction rub. No gallop.   Pulmonary:     Effort: Pulmonary effort is normal. No respiratory distress.     Breath sounds: Normal breath sounds. No wheezing or rales.  Lymphadenopathy:     Cervical: No cervical adenopathy.        Assessment & Plan    1. Type 2 diabetes mellitus without complication, without long-term current use of insulin (HCC) A1c improved to 6.5 from 7.5 with dietary changes. Continue Farxiga 10mg , glipizide xr 10mg , metformin 1000mg  BID, and janivia 100mg . Will see him back in 3 months.  - POCT glycosylated hemoglobin (Hb A1C)  2. Iron deficiency anemia due to chronic blood loss Stable on ferrous sulfate 325mg  BID. Will check labs as below and f/u pending results. - CBC w/Diff/Platelet - Fe+TIBC+Fer    Mar Daring, PA-C  [facility]

## 2018-10-01 ENCOUNTER — Encounter: Payer: Self-pay | Admitting: Physician Assistant

## 2018-10-01 ENCOUNTER — Ambulatory Visit: Payer: BLUE CROSS/BLUE SHIELD | Admitting: Physician Assistant

## 2018-10-01 VITALS — BP 102/64 | HR 61 | Temp 97.7°F | Resp 16 | Wt 168.0 lb

## 2018-10-01 DIAGNOSIS — E119 Type 2 diabetes mellitus without complications: Secondary | ICD-10-CM

## 2018-10-01 DIAGNOSIS — D500 Iron deficiency anemia secondary to blood loss (chronic): Secondary | ICD-10-CM

## 2018-10-01 LAB — POCT GLYCOSYLATED HEMOGLOBIN (HGB A1C): Hemoglobin A1C: 6.5 % — AB (ref 4.0–5.6)

## 2018-10-01 NOTE — Patient Instructions (Signed)

## 2018-10-02 LAB — CBC WITH DIFFERENTIAL/PLATELET
Basophils Absolute: 0.1 10*3/uL (ref 0.0–0.2)
Basos: 1 %
EOS (ABSOLUTE): 0.4 10*3/uL (ref 0.0–0.4)
EOS: 5 %
Hematocrit: 45.6 % (ref 37.5–51.0)
Hemoglobin: 15.4 g/dL (ref 13.0–17.7)
Immature Grans (Abs): 0 10*3/uL (ref 0.0–0.1)
Immature Granulocytes: 0 %
Lymphocytes Absolute: 1.4 10*3/uL (ref 0.7–3.1)
Lymphs: 18 %
MCH: 28.8 pg (ref 26.6–33.0)
MCHC: 33.8 g/dL (ref 31.5–35.7)
MCV: 85 fL (ref 79–97)
Monocytes Absolute: 0.6 10*3/uL (ref 0.1–0.9)
Monocytes: 7 %
Neutrophils Absolute: 5.4 10*3/uL (ref 1.4–7.0)
Neutrophils: 69 %
Platelets: 331 10*3/uL (ref 150–450)
RBC: 5.35 x10E6/uL (ref 4.14–5.80)
RDW: 14.9 % (ref 11.6–15.4)
WBC: 7.8 10*3/uL (ref 3.4–10.8)

## 2018-10-02 LAB — IRON,TIBC AND FERRITIN PANEL
Ferritin: 78 ng/mL (ref 30–400)
Iron Saturation: 26 % (ref 15–55)
Iron: 73 ug/dL (ref 38–169)
Total Iron Binding Capacity: 285 ug/dL (ref 250–450)
UIBC: 212 ug/dL (ref 111–343)

## 2018-10-04 ENCOUNTER — Other Ambulatory Visit: Payer: Self-pay | Admitting: Physician Assistant

## 2018-10-04 ENCOUNTER — Telehealth: Payer: Self-pay

## 2018-10-04 DIAGNOSIS — E119 Type 2 diabetes mellitus without complications: Secondary | ICD-10-CM

## 2018-10-04 NOTE — Telephone Encounter (Signed)
-----   Message from Mar Daring, [place] sent at 10/04/2018  8:35 AM EST ----- Hemoglobin is normal and iron stores have continued to improve.

## 2018-10-04 NOTE — Telephone Encounter (Signed)
[email or web address]

## 2018-10-04 NOTE — Telephone Encounter (Signed)
Patient was notified of results. Expressed understanding.  

## 2018-10-04 NOTE — Telephone Encounter (Signed)
Pt returned missed call.  Please call pt back. ° °Thanks, °[facility] °

## 2018-11-11 ENCOUNTER — Other Ambulatory Visit: Payer: Self-pay | Admitting: Physician Assistant

## 2018-11-11 ENCOUNTER — Ambulatory Visit: Payer: BLUE CROSS/BLUE SHIELD | Admitting: Physician Assistant

## 2018-11-11 ENCOUNTER — Encounter: Payer: Self-pay | Admitting: Physician Assistant

## 2018-11-11 VITALS — BP 124/68 | HR 80 | Temp 98.5°F | Wt 169.8 lb

## 2018-11-11 DIAGNOSIS — E119 Type 2 diabetes mellitus without complications: Secondary | ICD-10-CM

## 2018-11-11 DIAGNOSIS — R51 Headache: Secondary | ICD-10-CM

## 2018-11-11 DIAGNOSIS — I10 Essential (primary) hypertension: Secondary | ICD-10-CM

## 2018-11-11 DIAGNOSIS — E7800 Pure hypercholesterolemia, unspecified: Secondary | ICD-10-CM

## 2018-11-11 DIAGNOSIS — F418 Other specified anxiety disorders: Secondary | ICD-10-CM

## 2018-11-11 DIAGNOSIS — R519 Headache, unspecified: Secondary | ICD-10-CM

## 2018-11-11 MED ORDER — CYCLOBENZAPRINE HCL 5 MG PO TABS: 5.0000 mg | ORAL_TABLET | Freq: Three times a day (TID) | ORAL | 1 refills | Status: DC | PRN

## 2018-11-11 MED ORDER — PREDNISONE 5 MG (21) PO TBPK: ORAL_TABLET | ORAL | 0 refills | Status: DC

## 2018-11-11 NOTE — Patient Instructions (Signed)
Analgesic Rebound Headache An analgesic rebound headache, sometimes called a medication overuse headache, is a headache that comes after pain medicine (analgesic) taken to treat the original (primary) headache has worn off. Any type of primary headache can return as a rebound headache if a person regularly takes analgesics more than three times a week to treat it. The types of primary headaches that are commonly associated with rebound headaches include:  Migraines.  Headaches that arise from tense muscles in the head and neck area (tension headaches).  Headaches that develop and happen again (recur) on one side of the head and around the eye (cluster headaches). If rebound headaches continue, they become chronic daily headaches. What are the causes? This condition may be caused by frequent use of:  Over-the-counter medicines such as aspirin, ibuprofen, and acetaminophen.  Sinus relief medicines and other medicines that contain caffeine.  Narcotic pain medicines such as codeine and oxycodone. What are the signs or symptoms? The symptoms of a rebound headache are the same as the symptoms of the original headache. Some of the symptoms of specific types of headaches include: Migraine headache  Pulsing or throbbing pain on one or both sides of the head.  Severe pain that interferes with daily activities.  Pain that is worsened by physical activity.  Nausea, vomiting, or both.  Pain with exposure to bright light, loud noises, or strong smells.  General sensitivity to bright light, loud noises, or strong smells.  Visual changes.  Numbness of one or both arms. Tension headache  Pressure around the head.  Dull, aching head pain.  Pain felt over the front and sides of the head.  Tenderness in the muscles of the head, neck, and shoulders. Cluster headache  Severe pain that begins in or around one eye or temple.  Redness and tearing in the eye on the same side as the  pain.  Droopy or swollen eyelid.  One-sided head pain.  Nausea.  Runny nose.  Sweaty, pale facial skin.  Restlessness. How is this diagnosed? This condition is diagnosed by:  Reviewing your medical history. This includes the nature of your primary headaches.  Reviewing the types of pain medicines that you have been using to treat your headaches and how often you take them. How is this treated? This condition may be treated or managed by:  Discontinuing frequent use of the analgesic medicine. Doing this may worsen your headaches at first, but the pain should eventually become more manageable, less frequent, and less severe.  Seeing a headache specialist. He or she may be able to help you manage your headaches and help make sure there is not another cause of the headaches.  Using methods of stress relief, such as acupuncture, counseling, biofeedback, and massage. Talk with your health care provider about which methods might be good for you. Follow these instructions at home:  Take over-the-counter and prescription medicines only as told by your health care provider.  Stop the repeated use of pain medicine as told by your health care provider. Stopping can be difficult. Carefully follow instructions from your health care provider.  Avoid triggers that are known to cause your primary headaches.  Keep all follow-up visits as told by your health care provider. This is important. Contact a health care provider if:  You continue to experience headaches after following treatments that your health care provider recommended. Get help right away if:  You develop new headache pain.  You develop headache pain that is different than what you have  care provider. This is important.  Contact a health care provider if:  · You continue to experience headaches after following treatments that your health care provider recommended.  Get help right away if:  · You develop new headache pain.  · You develop headache pain that is different than what you have experienced in the past.  · You develop numbness or tingling in your arms or legs.  · You develop changes in your speech or vision.  This information is not intended to replace advice given to you by your health care provider. Make sure you  discuss any questions you have with your health care provider.  Document Released: 11/22/2003 Document Revised: 03/21/2016 Document Reviewed: 02/04/2016  Elsevier Interactive Patient Education © 2019 Elsevier Inc[employer]

## 2018-11-11 NOTE — Progress Notes (Signed)
Patient: Joseph Terry Male    DOB: 1961-05-12   58 y.o.   MRN: [id number] Visit Date: 11/11/2018  Today's Provider: Trinna Post, PA-C   Chief Complaint  Patient presents with  . Headache   Subjective:    Has had headaches previously that did not linger. Reports he has had a headache constantly for the past month every single day. Used afrin several months ago after a viral URI twice a day approximately once a week. Reports his headache is bilateral concentrated around his forehead and eyeballs. Reports pain at the worst is 6-7. Normal pain is 3-4. Does have times where pain is 0. Reports pain and stiffness in his neck. Reports stressful events at work. Cannot concentrate on what he is doing. Denies new medications in the past month. Has tried benadryl for allergies and sinuses. Using advil three times daily, but usually twice a day for one month. Reports no relief. Has tried excedrin migraine with the most relief from this. Denies vision loss, reports feeling somewhat off balance after her URI but this too has subsided. Has had headaches previously, this is not the worst headache of his life. Bps have been running normal.   Headache   This is a recurrent problem. The current episode started more than 1 month ago. The problem occurs intermittently. The problem has been unchanged. The pain is located in the frontal and retro-orbital region. The quality of the pain is described as dull and throbbing. The pain is at a severity of 5/10. The pain is mild. The symptoms are aggravated by fatigue, work and weather changes. He has tried acetaminophen, NSAIDs and Excedrin for the symptoms. The treatment provided no relief.    Allergies  Allergen Reactions  . Penicillins Itching    tingling  . Tramadol Rash     Current Outpatient Medications:  .  atorvastatin (LIPITOR) 40 MG tablet, TAKE 1 TABLET(40 MG) BY MOUTH DAILY AT 6 PM, Disp: 90 tablet, Rfl: 1 .  atorvastatin (LIPITOR) 40 MG  tablet, TAKE 1 TABLET BY MOUTH DAILY AT 6PM, Disp: 90 tablet, Rfl: 1 .  clonazePAM (KLONOPIN) 1 MG tablet, TAKE 1 TABLET BY MOUTH TWICE DAILY AS NEEDED, Disp: 60 tablet, Rfl: 5 .  FARXIGA 10 MG TABS tablet, TAKE 1 TABLET BY MOUTH DAILY, Disp: 90 tablet, Rfl: 1 .  ferrous sulfate 325 (65 FE) MG EC tablet, Take 1 tablet (325 mg total) by mouth 3 (three) times daily with meals., Disp: 90 tablet, Rfl: 3 .  glipiZIDE (GLUCOTROL XL) 10 MG 24 hr tablet, TAKE 1 TABLET BY MOUTH EVERY DAY WITH BREAKFAST, Disp: 90 tablet, Rfl: 3 .  metFORMIN (GLUCOPHAGE) 1000 MG tablet, TAKE 1 TABLET(1000 MG) BY MOUTH TWICE DAILY WITH A MEAL, Disp: 180 tablet, Rfl: 1 .  metoprolol tartrate (LOPRESSOR) 25 MG tablet, TAKE 1 TABLET(25 MG) BY MOUTH TWICE DAILY, Disp: 180 tablet, Rfl: 1 .  omeprazole (PRILOSEC) 40 MG capsule, Take 40 mg by mouth daily., Disp: , Rfl:  .  sitaGLIPtin (JANUVIA) 100 MG tablet, TAKE 1 TABLET(100 MG) BY MOUTH DAILY, Disp: 90 tablet, Rfl: 1 .  venlafaxine XR (EFFEXOR-XR) 150 MG 24 hr capsule, TAKE 1 CAPSULE(150 MG) BY MOUTH DAILY WITH BREAKFAST, Disp: 90 capsule, Rfl: 1 .  cyclobenzaprine (FLEXERIL) 5 MG tablet, Take 1 tablet (5 mg total) by mouth 3 (three) times daily as needed for muscle spasms., Disp: 30 tablet, Rfl: 1 .  predniSONE (STERAPRED UNI-PAK 21 TAB) 5 MG (  21) TBPK tablet, Take 6 pills on day 1, take 5 pills on day 2, take 4 pills on day 3 and so on., Disp: 21 tablet, Rfl: 0  Review of Systems  Respiratory: Negative.   Genitourinary: Negative.   Neurological: Positive for headaches.    Social History   Tobacco Use  . Smoking status: Former Smoker    Packs/day: 1.50    Years: 35.00    Pack years: 52.50    Types: Cigarettes    Last attempt to quit: 11/14/2011    Years since quitting: 6.9  . Smokeless tobacco: Never Used  . Tobacco comment: quit 2 weeks prior to CABG 12/2011  Substance Use Topics  . Alcohol use: Yes    Alcohol/week: 1.0 standard drinks    Types: 1 Cans of beer per  week    Comment: Drinks once per month; beer.      Objective:   BP 124/68 (BP Location: Left Arm, Patient Position: Sitting, Cuff Size: Normal)   Pulse 80   Temp 98.5 F (36.9 C) (Oral)   Wt 169 lb 12.8 oz (77 kg)   BMI 24.36 kg/m  Vitals:   11/11/18 0853  BP: 124/68  Pulse: 80  Temp: 98.5 F (36.9 C)  TempSrc: Oral  Weight: 169 lb 12.8 oz (77 kg)     Physical Exam Constitutional:      Appearance: He is well-developed and normal weight.  Eyes:     General: No visual field deficit. Cardiovascular:     Rate and Rhythm: Normal rate and regular rhythm.  Pulmonary:     Effort: Pulmonary effort is normal.     Breath sounds: Normal breath sounds.  Skin:    General: Skin is warm and dry.  Neurological:     Mental Status: He is alert.     GCS: GCS eye subscore is 4. GCS verbal subscore is 5. GCS motor subscore is 6.     Cranial Nerves: No cranial nerve deficit, dysarthria or facial asymmetry.     Sensory: No sensory deficit.     Motor: No weakness.     Gait: Gait normal.  Psychiatric:        Mood and Affect: Mood normal.        Behavior: Behavior normal.         Assessment & Plan    1. Nonintractable headache, unspecified chronicity pattern, unspecified headache type  58 year old with history of headache over the past month using advil 3 times daily. His physical exam reveals no neurodeficits. Exam and history seem consistent with tension headache complicated by medication rebound and overuse headache. Will have him stop taking advil daily. Will provide flexeril to help with an muscle tightness that can be contributing. Will try to break cycle of pain with steroid, advised his sugars will temporarily rise. If these interventions do not help, we will need to pursue head CT to r/o malignancy.  - cyclobenzaprine (FLEXERIL) 5 MG tablet; Take 1 tablet (5 mg total) by mouth 3 (three) times daily as needed for muscle spasms.  Dispense: 30 tablet; Refill: 1 - predniSONE  (STERAPRED UNI-PAK 21 TAB) 5 MG (21) TBPK tablet; Take 6 pills on day 1, take 5 pills on day 2, take 4 pills on day 3 and so on.  Dispense: 21 tablet; Refill: 0  2. Type II DM  Controlled.  3. HTN  Controlled.   The entirety of the information documented in the History of Present Illness, Review of Systems  and Physical Exam were personally obtained by me. Portions of this information were initially documented by Lynford Humphrey, CMA and reviewed by me for thoroughness and accuracy.   Return if symptoms worsen or fail to improve.     Trinna Post, PA-C  [facility]

## 2018-11-15 ENCOUNTER — Telehealth: Payer: Self-pay | Admitting: Physician Assistant

## 2018-11-15 DIAGNOSIS — R519 Headache, unspecified: Secondary | ICD-10-CM

## 2018-11-15 DIAGNOSIS — G8929 Other chronic pain: Secondary | ICD-10-CM

## 2018-11-15 DIAGNOSIS — R51 Headache: Principal | ICD-10-CM

## 2018-11-15 NOTE — Telephone Encounter (Signed)
Pt wanting to go ahead with having the scan done for the headaches he is having.  Please advise.  Thanks, [employer]

## 2018-11-16 NOTE — Telephone Encounter (Signed)
Pt saw Adriana in last visit for headaches.  He is wanting this request to go to her for a referral for his brain and sinus imaging to be checked asap.   Please call pt back asap with a status.  Thanks, [employer]

## 2018-11-16 NOTE — Telephone Encounter (Signed)
Head CT wo contrast ordered. He will be contacted.

## 2018-11-19 ENCOUNTER — Other Ambulatory Visit: Payer: Self-pay

## 2018-11-19 ENCOUNTER — Telehealth: Payer: Self-pay

## 2018-11-19 ENCOUNTER — Ambulatory Visit
Admission: RE | Admit: 2018-11-19 | Discharge: 2018-11-19 | Disposition: A | Discharge status: Home / Self Care | Payer: BLUE CROSS/BLUE SHIELD | Source: Ambulatory Visit | Attending: Physician Assistant | Admitting: Physician Assistant

## 2018-11-19 DIAGNOSIS — R519 Headache, unspecified: Secondary | ICD-10-CM

## 2018-11-19 DIAGNOSIS — G8929 Other chronic pain: Secondary | ICD-10-CM

## 2018-11-19 DIAGNOSIS — R51 Headache: Secondary | ICD-10-CM | POA: Diagnosis not present

## 2018-11-19 NOTE — Telephone Encounter (Signed)
-----   Message from Trinna Post, [place] sent at 11/19/2018 11:10 AM EST ----- Head ct is normal. Sinuses were unremarkable. If he is still having headaches recommend he see neurology.

## 2018-11-19 NOTE — Telephone Encounter (Signed)
Patient advised as below. Patient states he is still having headaches, but would like to hold off on a Neurology referral for now. Patient will call back if he decides to go through with the Neurology referral.

## 2018-12-07 DIAGNOSIS — H5213 Myopia, bilateral: Secondary | ICD-10-CM | POA: Diagnosis not present

## 2018-12-07 LAB — HM DIABETES EYE EXAM

## 2018-12-20 ENCOUNTER — Telehealth: Payer: Self-pay

## 2018-12-20 NOTE — Telephone Encounter (Signed)
Yeah just have him come in first thing instead.

## 2018-12-20 NOTE — Telephone Encounter (Signed)
Are you ok with this? Or do you want me to look at a day where he can be the first one here?

## 2018-12-20 NOTE — Telephone Encounter (Signed)
done

## 2018-12-20 NOTE — Telephone Encounter (Signed)
Patient would like to only do blood sugar in car on the April 17th. He cannot do an e-visit because he does not have proper equipment. Please call.

## 2018-12-28 ENCOUNTER — Other Ambulatory Visit: Payer: Self-pay | Admitting: Physician Assistant

## 2018-12-28 DIAGNOSIS — E119 Type 2 diabetes mellitus without complications: Secondary | ICD-10-CM

## 2018-12-31 ENCOUNTER — Encounter: Payer: Self-pay | Admitting: Physician Assistant

## 2018-12-31 ENCOUNTER — Ambulatory Visit: Payer: Self-pay | Admitting: Physician Assistant

## 2018-12-31 ENCOUNTER — Other Ambulatory Visit: Payer: Self-pay

## 2018-12-31 ENCOUNTER — Ambulatory Visit (INDEPENDENT_AMBULATORY_CARE_PROVIDER_SITE_OTHER): Payer: BLUE CROSS/BLUE SHIELD | Admitting: Physician Assistant

## 2018-12-31 ENCOUNTER — Other Ambulatory Visit: Payer: Self-pay | Admitting: Physician Assistant

## 2018-12-31 VITALS — BP 122/70 | HR 62 | Temp 98.0°F | Resp 16 | Wt 167.6 lb

## 2018-12-31 DIAGNOSIS — E119 Type 2 diabetes mellitus without complications: Secondary | ICD-10-CM | POA: Diagnosis not present

## 2018-12-31 LAB — POCT GLYCOSYLATED HEMOGLOBIN (HGB A1C)
Est. average glucose Bld gHb Est-mCnc: 134
Hemoglobin A1C: 6.3 % — AB (ref 4.0–5.6)

## 2018-12-31 NOTE — Progress Notes (Signed)
6.3      Patient: Joseph Terry Male    DOB: 1961-02-05   58 y.o.   MRN: [id number] Visit Date: 12/31/2018  Today's Provider: Mar Daring, PA-C   Chief Complaint  Patient presents with  . Follow-up    T2DM   Subjective:     HPI   Diabetes Mellitus Type II, Follow-up:   Lab Results  Component Value Date   HGBA1C 6.3 (A) 12/31/2018   HGBA1C 6.5 (A) 10/01/2018   HGBA1C 7.5 (A) 07/01/2018    Last seen for diabetes 3 months ago.  Management since then includes none. He reports excellent compliance with treatment. He is not having side effects.  Current symptoms include none and have been stable. Home blood sugar records: fasting range: not checking Current Insulin Regimen: none Weight trend: stable  Current exercise: none Current diet habits: in general, a "healthy" diet    Pertinent Labs:    Component Value Date/Time   CHOL 97 (L) 03/31/2018 0957   TRIG 160 (H) 03/31/2018 0957   HDL 27 (L) 03/31/2018 0957   LDLCALC 38 03/31/2018 0957   CREATININE 0.77 03/31/2018 0957   CREATININE 0.76 06/18/2015 0821    Wt Readings from Last 3 Encounters:  12/31/18 167 lb 9.6 oz (76 kg)  11/11/18 169 lb 12.8 oz (77 kg)  10/01/18 168 lb (76.2 kg)   -----------------------------------------------------------------------    Allergies  Allergen Reactions  . Penicillins Itching    tingling  . Tramadol Rash     Current Outpatient Medications:  .  atorvastatin (LIPITOR) 40 MG tablet, TAKE 1 TABLET BY MOUTH DAILY AT 6PM, Disp: 90 tablet, Rfl: 1 .  clonazePAM (KLONOPIN) 1 MG tablet, TAKE 1 TABLET BY MOUTH TWICE DAILY AS NEEDED, Disp: 60 tablet, Rfl: 5 .  cyclobenzaprine (FLEXERIL) 5 MG tablet, Take 1 tablet (5 mg total) by mouth 3 (three) times daily as needed for muscle spasms., Disp: 30 tablet, Rfl: 1 .  FARXIGA 10 MG TABS tablet, TAKE 1 TABLET BY MOUTH DAILY, Disp: 90 tablet, Rfl: 1 .  ferrous sulfate 325 (65 FE) MG EC tablet, Take 1 tablet (325 mg total) by  mouth 3 (three) times daily with meals., Disp: 90 tablet, Rfl: 3 .  glipiZIDE (GLUCOTROL XL) 10 MG 24 hr tablet, TAKE 1 TABLET BY MOUTH EVERY DAY WITH BREAKFAST, Disp: 90 tablet, Rfl: 3 .  metFORMIN (GLUCOPHAGE) 1000 MG tablet, TAKE 1 TABLET(1000 MG) BY MOUTH TWICE DAILY WITH A MEAL, Disp: 180 tablet, Rfl: 1 .  metoprolol tartrate (LOPRESSOR) 25 MG tablet, TAKE 1 TABLET(25 MG) BY MOUTH TWICE DAILY, Disp: 180 tablet, Rfl: 1 .  omeprazole (PRILOSEC) 40 MG capsule, Take 40 mg by mouth daily., Disp: , Rfl:  .  sitaGLIPtin (JANUVIA) 100 MG tablet, TAKE 1 TABLET BY MOUTH DAILY, Disp: 90 tablet, Rfl: 1 .  venlafaxine XR (EFFEXOR-XR) 150 MG 24 hr capsule, TAKE 1 CAPSULE(150 MG) BY MOUTH DAILY WITH BREAKFAST, Disp: 90 capsule, Rfl: 1 .  predniSONE (STERAPRED UNI-PAK 21 TAB) 5 MG (21) TBPK tablet, Take 6 pills on day 1, take 5 pills on day 2, take 4 pills on day 3 and so on. (Patient not taking: Reported on 12/31/2018), Disp: 21 tablet, Rfl: 0  Review of Systems  Constitutional: Negative.   Respiratory: Negative.   Cardiovascular: Negative.   Gastrointestinal: Negative.   Neurological: Negative.     Social History   Tobacco Use  . Smoking status: Former Smoker    Packs/day: 1.50  Years: 35.00    Pack years: 52.50    Types: Cigarettes    Last attempt to quit: 11/14/2011    Years since quitting: 7.1  . Smokeless tobacco: Never Used  . Tobacco comment: quit 2 weeks prior to CABG 12/2011  Substance Use Topics  . Alcohol use: Yes    Alcohol/week: 1.0 standard drinks    Types: 1 Cans of beer per week    Comment: Drinks once per month; beer.      Objective:   BP 122/70 (BP Location: Left Arm, Patient Position: Sitting, Cuff Size: Large)   Pulse 62   Temp 98 F (36.7 C) (Oral)   Resp 16   Wt 167 lb 9.6 oz (76 kg)   SpO2 99%   BMI 24.05 kg/m  Vitals:   12/31/18 0817  BP: 122/70  Pulse: 62  Resp: 16  Temp: 98 F (36.7 C)  TempSrc: Oral  SpO2: 99%  Weight: 167 lb 9.6 oz (76 kg)      Physical Exam Vitals signs reviewed.  Constitutional:      General: He is not in acute distress.    Appearance: Normal appearance. He is well-developed and normal weight. He is not ill-appearing or diaphoretic.  HENT:     Head: Normocephalic and atraumatic.  Neck:     Musculoskeletal: Normal range of motion and neck supple.  Cardiovascular:     Rate and Rhythm: Normal rate and regular rhythm.     Heart sounds: Normal heart sounds. No murmur. No friction rub. No gallop.   Pulmonary:     Effort: Pulmonary effort is normal. No respiratory distress.     Breath sounds: Normal breath sounds. No wheezing or rales.  Musculoskeletal:     Right lower leg: No edema.     Left lower leg: No edema.  Neurological:     Mental Status: He is alert.        Assessment & Plan    1. Type 2 diabetes mellitus without complication, without long-term current use of insulin (HCC) A1c down to 6.3. Doing well with lifestyle modifications. Denies hypoglycemic episodes. Continue metformin, glipizide, januvia and farxiga. I will see him back in 3 months for his CPE. - POCT glycosylated hemoglobin (Hb A1C)     Mar Daring, PA-C  [facility]

## 2018-12-31 NOTE — Patient Instructions (Signed)
Diabetes Mellitus and Nutrition, Adult  When you have diabetes (diabetes mellitus), it is very important to have healthy eating habits because your blood sugar (glucose) levels are greatly affected by what you eat and drink. Eating healthy foods in the appropriate amounts, at about the same times every day, can help you:  · Control your blood glucose.  · Lower your risk of heart disease.  · Improve your blood pressure.  · Reach or maintain a healthy weight.  Every person with diabetes is different, and each person has different needs for a meal plan. Your health care provider may recommend that you work with a diet and nutrition specialist (dietitian) to make a meal plan that is best for you. Your meal plan may vary depending on factors such as:  · The calories you need.  · The medicines you take.  · Your weight.  · Your blood glucose, blood pressure, and cholesterol levels.  · Your activity level.  · Other health conditions you have, such as heart or kidney disease.  How do carbohydrates affect me?  Carbohydrates, also called carbs, affect your blood glucose level more than any other type of food. Eating carbs naturally raises the amount of glucose in your blood. Carb counting is a method for keeping track of how many carbs you eat. Counting carbs is important to keep your blood glucose at a healthy level, especially if you use insulin or take certain oral diabetes medicines.  It is important to know how many carbs you can safely have in each meal. This is different for every person. Your dietitian can help you calculate how many carbs you should have at each meal and for each snack.  Foods that contain carbs include:  · Bread, cereal, rice, pasta, and crackers.  · Potatoes and corn.  · Peas, beans, and lentils.  · Milk and yogurt.  · Fruit and juice.  · Desserts, such as cakes, cookies, ice cream, and candy.  How does alcohol affect me?  Alcohol can cause a sudden decrease in blood glucose (hypoglycemia),  especially if you use insulin or take certain oral diabetes medicines. Hypoglycemia can be a life-threatening condition. Symptoms of hypoglycemia (sleepiness, dizziness, and confusion) are similar to symptoms of having too much alcohol.  If your health care provider says that alcohol is safe for you, follow these guidelines:  · Limit alcohol intake to no more than 1 drink per day for nonpregnant women and 2 drinks per day for men. One drink equals 12 oz of beer, 5 oz of wine, or 1½ oz of hard liquor.  · Do not drink on an empty stomach.  · Keep yourself hydrated with water, diet soda, or unsweetened iced tea.  · Keep in mind that regular soda, juice, and other mixers may contain a lot of sugar and must be counted as carbs.  What are tips for following this plan?    Reading food labels  · Start by checking the serving size on the "Nutrition Facts" label of packaged foods and drinks. The amount of calories, carbs, fats, and other nutrients listed on the label is based on one serving of the item. Many items contain more than one serving per package.  · Check the total grams (g) of carbs in one serving. You can calculate the number of servings of carbs in one serving by dividing the total carbs by 15. For example, if a food has 30 g of total carbs, it would be equal to 2   servings of carbs.  · Check the number of grams (g) of saturated and trans fats in one serving. Choose foods that have low or no amount of these fats.  · Check the number of milligrams (mg) of salt (sodium) in one serving. Most people should limit total sodium intake to less than 2,300 mg per day.  · Always check the nutrition information of foods labeled as "low-fat" or "nonfat". These foods may be higher in added sugar or refined carbs and should be avoided.  · Talk to your dietitian to identify your daily goals for nutrients listed on the label.  Shopping  · Avoid buying canned, premade, or processed foods. These foods tend to be high in fat, sodium,  and added sugar.  · Shop around the outside edge of the grocery store. This includes fresh fruits and vegetables, bulk grains, fresh meats, and fresh dairy.  Cooking  · Use low-heat cooking methods, such as baking, instead of high-heat cooking methods like deep frying.  · Cook using healthy oils, such as olive, canola, or sunflower oil.  · Avoid cooking with butter, cream, or high-fat meats.  Meal planning  · Eat meals and snacks regularly, preferably at the same times every day. Avoid going long periods of time without eating.  · Eat foods high in fiber, such as fresh fruits, vegetables, beans, and whole grains. Talk to your dietitian about how many servings of carbs you can eat at each meal.  · Eat 4-6 ounces (oz) of lean protein each day, such as lean meat, chicken, fish, eggs, or tofu. One oz of lean protein is equal to:  ? 1 oz of meat, chicken, or fish.  ? 1 egg.  ? ¼ cup of tofu.  · Eat some foods each day that contain healthy fats, such as avocado, nuts, seeds, and fish.  Lifestyle  · Check your blood glucose regularly.  · Exercise regularly as told by your health care provider. This may include:  ? 150 minutes of moderate-intensity or vigorous-intensity exercise each week. This could be brisk walking, biking, or water aerobics.  ? Stretching and doing strength exercises, such as yoga or weightlifting, at least 2 times a week.  · Take medicines as told by your health care provider.  · Do not use any products that contain nicotine or tobacco, such as cigarettes and e-cigarettes. If you need help quitting, ask your health care provider.  · Work with a counselor or diabetes educator to identify strategies to manage stress and any emotional and social challenges.  Questions to ask a health care provider  · Do I need to meet with a diabetes educator?  · Do I need to meet with a dietitian?  · What number can I call if I have questions?  · When are the best times to check my blood glucose?  Where to find more  information:  · American Diabetes Association: diabetes.org  · Academy of Nutrition and Dietetics: www.eatright.org  · National Institute of Diabetes and Digestive and Kidney Diseases (NIH): www.niddk.nih.gov  Summary  · A healthy meal plan will help you control your blood glucose and maintain a healthy lifestyle.  · Working with a diet and nutrition specialist (dietitian) can help you make a meal plan that is best for you.  · Keep in mind that carbohydrates (carbs) and alcohol have immediate effects on your blood glucose levels. It is important to count carbs and to use alcohol carefully.  This information is not intended to   replace advice given to you by your health care provider. Make sure you discuss any questions you have with your health care provider.  Document Released: 05/29/2005 Document Revised: 04/01/2017 Document Reviewed: 10/06/2016  Elsevier Interactive Patient Education © 2019 [employer]

## 2019-02-05 ENCOUNTER — Other Ambulatory Visit: Payer: Self-pay | Admitting: Physician Assistant

## 2019-02-05 DIAGNOSIS — E119 Type 2 diabetes mellitus without complications: Secondary | ICD-10-CM

## 2019-02-07 ENCOUNTER — Other Ambulatory Visit: Payer: Self-pay | Admitting: Physician Assistant

## 2019-02-07 DIAGNOSIS — I10 Essential (primary) hypertension: Secondary | ICD-10-CM

## 2019-02-17 DIAGNOSIS — M5033 Other cervical disc degeneration, cervicothoracic region: Secondary | ICD-10-CM | POA: Diagnosis not present

## 2019-02-17 DIAGNOSIS — M5441 Lumbago with sciatica, right side: Secondary | ICD-10-CM | POA: Diagnosis not present

## 2019-02-17 DIAGNOSIS — M9901 Segmental and somatic dysfunction of cervical region: Secondary | ICD-10-CM | POA: Diagnosis not present

## 2019-02-17 DIAGNOSIS — M9903 Segmental and somatic dysfunction of lumbar region: Secondary | ICD-10-CM | POA: Diagnosis not present

## 2019-02-18 DIAGNOSIS — M9901 Segmental and somatic dysfunction of cervical region: Secondary | ICD-10-CM | POA: Diagnosis not present

## 2019-02-18 DIAGNOSIS — M9903 Segmental and somatic dysfunction of lumbar region: Secondary | ICD-10-CM | POA: Diagnosis not present

## 2019-02-18 DIAGNOSIS — M5033 Other cervical disc degeneration, cervicothoracic region: Secondary | ICD-10-CM | POA: Diagnosis not present

## 2019-02-18 DIAGNOSIS — M5441 Lumbago with sciatica, right side: Secondary | ICD-10-CM | POA: Diagnosis not present

## 2019-03-15 DIAGNOSIS — M9903 Segmental and somatic dysfunction of lumbar region: Secondary | ICD-10-CM | POA: Diagnosis not present

## 2019-03-15 DIAGNOSIS — M9901 Segmental and somatic dysfunction of cervical region: Secondary | ICD-10-CM | POA: Diagnosis not present

## 2019-03-15 DIAGNOSIS — M5441 Lumbago with sciatica, right side: Secondary | ICD-10-CM | POA: Diagnosis not present

## 2019-03-15 DIAGNOSIS — M5033 Other cervical disc degeneration, cervicothoracic region: Secondary | ICD-10-CM | POA: Diagnosis not present

## 2019-03-20 ENCOUNTER — Other Ambulatory Visit: Payer: Self-pay | Admitting: Physician Assistant

## 2019-03-20 DIAGNOSIS — E119 Type 2 diabetes mellitus without complications: Secondary | ICD-10-CM

## 2019-03-21 ENCOUNTER — Other Ambulatory Visit: Payer: Self-pay | Admitting: Physician Assistant

## 2019-03-21 DIAGNOSIS — F418 Other specified anxiety disorders: Secondary | ICD-10-CM

## 2019-03-22 NOTE — Telephone Encounter (Signed)
L.O.V. 12/31/2018. Please advise.

## 2019-04-01 NOTE — Progress Notes (Deleted)
       Patient: Joseph Terry Male    DOB: Jan 28, 1961   58 y.o.   MRN: [id number] Visit Date: 04/01/2019  Today's Provider: Mar Daring, PA-C   No chief complaint on file.  Subjective:     HPI  Allergies  Allergen Reactions  . Penicillins Itching    tingling  . Tramadol Rash     Current Outpatient Medications:  .  atorvastatin (LIPITOR) 40 MG tablet, TAKE 1 TABLET BY MOUTH DAILY AT 6PM, Disp: 90 tablet, Rfl: 1 .  clonazePAM (KLONOPIN) 1 MG tablet, TAKE 1 TABLET BY MOUTH TWICE DAILY AS NEEDED, Disp: 60 tablet, Rfl: 5 .  cyclobenzaprine (FLEXERIL) 5 MG tablet, Take 1 tablet (5 mg total) by mouth 3 (three) times daily as needed for muscle spasms., Disp: 30 tablet, Rfl: 1 .  FARXIGA 10 MG TABS tablet, TAKE 1 TABLET BY MOUTH DAILY, Disp: 90 tablet, Rfl: 1 .  ferrous sulfate 325 (65 FE) MG EC tablet, Take 1 tablet (325 mg total) by mouth 3 (three) times daily with meals., Disp: 90 tablet, Rfl: 3 .  glipiZIDE (GLUCOTROL XL) 10 MG 24 hr tablet, TAKE 1 TABLET BY MOUTH EVERY DAY WITH BREAKFAST, Disp: 90 tablet, Rfl: 3 .  metFORMIN (GLUCOPHAGE) 1000 MG tablet, TAKE 1 TABLET(1000 MG) BY MOUTH TWICE DAILY WITH A MEAL, Disp: 180 tablet, Rfl: 1 .  metoprolol tartrate (LOPRESSOR) 25 MG tablet, TAKE 1 TABLET(25 MG) BY MOUTH TWICE DAILY, Disp: 180 tablet, Rfl: 1 .  omeprazole (PRILOSEC) 40 MG capsule, Take 40 mg by mouth daily., Disp: , Rfl:  .  sitaGLIPtin (JANUVIA) 100 MG tablet, TAKE 1 TABLET BY MOUTH DAILY, Disp: 90 tablet, Rfl: 1 .  venlafaxine XR (EFFEXOR-XR) 150 MG 24 hr capsule, TAKE 1 CAPSULE(150 MG) BY MOUTH DAILY WITH BREAKFAST, Disp: 90 capsule, Rfl: 1  Review of Systems  Social History   Tobacco Use  . Smoking status: Former Smoker    Packs/day: 1.50    Years: 35.00    Pack years: 52.50    Types: Cigarettes    Quit date: 11/14/2011    Years since quitting: 7.3  . Smokeless tobacco: Never Used  . Tobacco comment: quit 2 weeks prior to CABG 12/2011  Substance Use  Topics  . Alcohol use: Yes    Alcohol/week: 1.0 standard drinks    Types: 1 Cans of beer per week    Comment: Drinks once per month; beer.      Objective:   There were no vitals taken for this visit. There were no vitals filed for this visit.   Physical Exam   No results found for any visits on 04/04/19.     [facility] [place], PA-C  [facility]

## 2019-04-01 NOTE — Progress Notes (Signed)
Patient: Joseph Terry, Male    DOB: [birth year], 58 y.o.   MRN: [id number] Visit Date: 04/04/2019  Today's Provider: Mar Daring, PA-C   No chief complaint on file.  Subjective:     Annual physical exam Joseph Terry is a 58 y.o. male who presents today for health maintenance and complete physical. He feels well. He reports exercising none. He reports he is sleeping well. -----------------------------------------------------------------   Review of Systems  Constitutional: Negative.   HENT: Negative.   Eyes: Negative.   Respiratory: Negative.   Cardiovascular: Negative.   Gastrointestinal: Negative.   Endocrine: Negative.   Genitourinary: Negative.   Musculoskeletal: Negative.   Skin: Negative.   Allergic/Immunologic: Negative.   Neurological: Negative.   Hematological: Negative.   Psychiatric/Behavioral: Negative.     Social History      He  reports that he quit smoking about 7 years ago. His smoking use included cigarettes. He has a 52.50 pack-year smoking history. He has never used smokeless tobacco. He reports current alcohol use of about 1.0 standard drinks of alcohol per week. He reports that he does not use drugs.       Social History   Socioeconomic History  . Marital status: Divorced    Spouse name: Not on file  . Number of children: 1  . Years of education: 66  . Highest education level: Not on file  Occupational History  . 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 Financial resource strain: Not on file  . Food insecurity    Worry: Not on file    Inability: Not on file  . Transportation needs    Medical: Not on file    Non-medical: Not on file  Tobacco Use  . Smoking status: Former Smoker    Packs/day: 1.50    Years: 35.00    Pack years: 52.50    Types: Cigarettes    Quit date: 11/14/2011    Years since quitting: 7.3  . Smokeless tobacco: Never Used  . Tobacco comment: quit 2  weeks prior to CABG 12/2011  Substance and Sexual Activity  . Alcohol use: Yes    Alcohol/week: 1.0 standard drinks    Types: 1 Cans of beer per week    Comment: Drinks once per month; beer.  . Drug use: No  . Sexual activity: Yes    Partners: Female    Birth control/protection: Condom  Lifestyle  . Physical activity    Days per week: Not on file    Minutes per session: Not on file  . Stress: Not on file  Relationships  . Social Herbalist on phone: Not on file    Gets together: Not on file    Attends religious service: Not on file    Active member of club or organization: Not on file    Attends meetings of clubs or organizations: Not on file    Relationship status: Not on file  Other Topics Concern  . Not on file  Social History Narrative   Marital status: divorced since 1996; dating casually in 2016.        Lives: Lives alone.          Children:  One son and a granddaughter 32 years old live in [place].       Tobacco: quit with CABG      Alcohol: weekends; 6-8 beers on  average.  Beers with football; ten on football weekends.      Exercise: Light, yard work.        Employment:  Works 45-50 hrs. Some weeks.  [employer].  Desk work since 05/2013. Since 1979.      Guns:  Guns in the home not stored in locked cabinet. Smoke alarm in home, Always wears seatbelts.       Sexual History: active;Last HIV testing 2015; has has 25+ partners; females only.             Past Medical History:  Diagnosis Date  . Anxiety state, unspecified   . Barrett's esophagus   . CAD (coronary artery disease), severe multivessel CAD surgical consult for CABG    a. 12/2011 Cath: 3 vessel CAD with normal EF;  b. 12/2011 CABG x 5: LIMA to LAD, SVG to distal RCA with sequential SVG to PDA, SVG to OM, and SVG to D2;  c. 03/2014 ETT: Ex time 6:21, 28mm horizontal ST dep in V3-V6, HTN response.  . Depression   . Diaphragmatic hernia without mention of obstruction or gangrene   .  Diverticulosis   . GERD (gastroesophageal reflux disease)   . Hyperlipidemia   . Hypertension   . Impotence of organic origin   . Internal hemorrhoids without mention of complication   . Iron deficiency anemia due to chronic blood loss 04/15/2017  . Kidney stone   . Motion sickness    cars, boats  . OSA on CPAP    a. Sleep study 07/2013 +sever OSA; CPAP at 10cm recommended.  . Personal history of colonic polyps   . Type II diabetes mellitus ([place])   . Wears contact lenses      Patient Active Problem List   Diagnosis Date Noted  . Blood loss anemia   . Polyp of sigmoid colon   . Iron deficiency anemia due to chronic blood loss 04/15/2017  . Type 2 diabetes mellitus without complication, without long-term current use of insulin ([place]) 09/26/2016  . Anemia 12/26/2013  . Barrett's esophagus 12/01/2013  . Personal history of colonic polyps 10/19/2013  . OSA on CPAP 09/20/2013  . GERD (gastroesophageal reflux disease) 02/28/2013  . Coronary artery disease 08/30/2012  . Depression with anxiety 05/31/2012  . Hypertension   . Hyperlipidemia   . History of tobacco abuse 01/06/2012  . S/P CABG x 5 01/05/2012  . Unstable angina ([place]) 01/02/2012  . Cardiovascular stress test abnormal 01/02/2012  . CAD at cath, Nl LVF, severe multivessel CAD surgical consult for CABG 01/02/2012    Past Surgical History:  Procedure Laterality Date  . CARDIAC CATHETERIZATION  4 /19/ 2013   [place]  11/03/13  . COLONOSCOPY  11/01/13   multiple polyps; diverticulosis. [place]. Repeat 5 years.  . COLONOSCOPY W/ POLYPECTOMY  09/16/2007   colon polyps.  Repeat 5 years. I[facility].  . COLONOSCOPY WITH PROPOFOL N/A 06/04/2017   Procedure: COLONOSCOPY WITH PROPOFOL;  Surgeon: [name], MD;  Location: [place];  Service: Gastroenterology;  Laterality: N/A;  needs to remain first patient stated he also suppose to get EGD also  . CORONARY ARTERY BYPASS GRAFT  01/05/2012    Procedure: CORONARY ARTERY BYPASS GRAFTING (CABG);  Surgeon: Rexene Alberts, MD;  Location: [place];  Service: Open Heart Surgery;  Laterality: N/A;  coronary artery bypass graft times five using left internal mammary artery and right leg greater saphenous vein harvested endoscopically   . ESOPHAGOGASTRODUODENOSCOPY N/A 06/04/2017  PT NEEDS REPEAT IN 06/2020/BARRETT'S ESOPHAGUS - Surgeon: [name], MD  . GIVENS CAPSULE STUDY N/A 06/22/2017   Procedure: GIVENS CAPSULE STUDY;  Surgeon: [name], MD;  Location: [facility] ENDOSCOPY;  Service: Endoscopy;  Laterality: N/A;  . LEFT HEART CATHETERIZATION WITH CORONARY ANGIOGRAM N/A 01/02/2012   Procedure: LEFT HEART CATHETERIZATION WITH CORONARY ANGIOGRAM;  Surgeon: Troy Sine, MD;  Location[facility] CATH LAB;  Service: Cardiovascular;  Laterality: N/A;  . POLYPECTOMY N/A 06/04/2017   Procedure: POLYPECTOMY;  Surgeon: [name], MD;  Location: [place];  Service: Gastroenterology;  Laterality: N/A;  . POLYSOMNOGRAPHY  07/2013   +severe OSA; CPAP at 10cm pressure recommended.  . rotator cuff curgery  2011   lt rotator cuff  . Shoulder surgery  1997   bilateral  . WISDOM TOOTH EXTRACTION      Family History        Family Status  Relation Name Status  . Mother  Deceased at age 22       lung cancer; Defibrillator; age 40.  . Father  Deceased at age 30       COPD  . Sister  Alive  . MGM  Deceased  . MGF  Deceased  . PGM  Deceased  . PGF  Deceased  . Sister  Alive  . Sister  Alive  . Brother  Alive  . Brother  Alive  . Brother  Alive  . Son  Alive        His family history includes COPD in his father; Cancer in his paternal grandfather and paternal grandmother; Cancer (age of onset: 20) in his father; Cancer (age of onset: 7) in his mother; Diabetes in his mother; Heart attack in his mother; Heart disease in his maternal grandfather, maternal grandmother, and mother; Hyperlipidemia in his father and mother; Hypertension in his  father, mother, sister, and sister.      Allergies  Allergen Reactions  . Penicillins Itching    tingling  . Tramadol Rash     Current Outpatient Medications:  .  atorvastatin (LIPITOR) 40 MG tablet, TAKE 1 TABLET BY MOUTH DAILY AT 6PM, Disp: 90 tablet, Rfl: 1 .  clonazePAM (KLONOPIN) 1 MG tablet, TAKE 1 TABLET BY MOUTH TWICE DAILY AS NEEDED, Disp: 60 tablet, Rfl: 5 .  cyclobenzaprine (FLEXERIL) 5 MG tablet, Take 1 tablet (5 mg total) by mouth 3 (three) times daily as needed for muscle spasms., Disp: 30 tablet, Rfl: 1 .  FARXIGA 10 MG TABS tablet, TAKE 1 TABLET BY MOUTH DAILY, Disp: 90 tablet, Rfl: 1 .  ferrous sulfate 325 (65 FE) MG EC tablet, Take 1 tablet (325 mg total) by mouth 3 (three) times daily with meals., Disp: 90 tablet, Rfl: 3 .  glipiZIDE (GLUCOTROL XL) 10 MG 24 hr tablet, TAKE 1 TABLET BY MOUTH EVERY DAY WITH BREAKFAST, Disp: 90 tablet, Rfl: 3 .  metFORMIN (GLUCOPHAGE) 1000 MG tablet, TAKE 1 TABLET(1000 MG) BY MOUTH TWICE DAILY WITH A MEAL, Disp: 180 tablet, Rfl: 1 .  metoprolol tartrate (LOPRESSOR) 25 MG tablet, TAKE 1 TABLET(25 MG) BY MOUTH TWICE DAILY, Disp: 180 tablet, Rfl: 1 .  omeprazole (PRILOSEC) 40 MG capsule, Take 1 capsule (40 mg total) by mouth daily., Disp: 90 capsule, Rfl: 1 .  sitaGLIPtin (JANUVIA) 100 MG tablet, TAKE 1 TABLET BY MOUTH DAILY, Disp: 90 tablet, Rfl: 1 .  venlafaxine XR (EFFEXOR-XR) 150 MG 24 hr capsule, TAKE 1 CAPSULE(150 MG) BY MOUTH DAILY WITH BREAKFAST, Disp: 90 capsule, Rfl: 1  Patient Care Team: Rubye Beach as PCP - General (Family Medicine) Terance Ice, MD (Inactive) as Consulting Physician (Cardiology) [name], MD as Consulting Physician (Family Medicine) Christene Lye, MD (General Surgery) Cristy Friedlander, MD (Oral Surgery)    Objective:    Vitals: There were no vitals taken for this visit.  There were no vitals filed for this visit.   Physical Exam Vitals signs reviewed.   Constitutional:      General: He is not in acute distress.    Appearance: Normal appearance. He is well-developed and normal weight. He is not ill-appearing.  HENT:     Head: Normocephalic and atraumatic.     Right Ear: Tympanic membrane, ear canal and external ear normal.     Left Ear: Tympanic membrane, ear canal and external ear normal.     Nose: Nose normal.     Mouth/Throat:     Mouth: Mucous membranes are moist.  Eyes:     General:        Right eye: No discharge.        Left eye: No discharge.     Extraocular Movements: Extraocular movements intact.     Conjunctiva/sclera: Conjunctivae normal.     Pupils: Pupils are equal, round, and reactive to light.  Neck:     Musculoskeletal: Normal range of motion and neck supple.     Thyroid: No thyromegaly.     Vascular: No carotid bruit.     Trachea: No tracheal deviation.  Cardiovascular:     Rate and Rhythm: Normal rate and regular rhythm.     Pulses: Normal pulses.     Heart sounds: Normal heart sounds. No murmur.  Pulmonary:     Effort: Pulmonary effort is normal. No respiratory distress.     Breath sounds: Normal breath sounds. No wheezing or rales.  Chest:     Chest wall: No tenderness.  Abdominal:     General: Abdomen is flat. Bowel sounds are normal. There is no distension.     Palpations: Abdomen is soft. There is no mass.     Tenderness: There is no abdominal tenderness. There is no guarding or rebound.     Hernia: No hernia is present.  Genitourinary:    Comments: Deferred per patient Musculoskeletal: Normal range of motion.        General: No tenderness.  Lymphadenopathy:     Cervical: No cervical adenopathy.  Skin:    General: Skin is warm and dry.     Capillary Refill: Capillary refill takes less than 2 seconds.     Findings: No erythema or rash.  Neurological:     General: No focal deficit present.     Mental Status: He is alert and oriented to person, place, and time. Mental status is at baseline.      Cranial Nerves: No cranial nerve deficit.     Motor: No weakness or abnormal muscle tone.     Coordination: Coordination normal.     Gait: Gait normal.     Deep Tendon Reflexes: Reflexes are normal and symmetric. Reflexes normal.  Psychiatric:        Mood and Affect: Mood normal.        Behavior: Behavior normal.        Thought Content: Thought content normal.        Judgment: Judgment normal.      Depression Screen PHQ 2/9 Scores 04/04/2019 03/31/2018 12/26/2016 03/25/2016  PHQ - 2 Score 1 2 0  0  PHQ- 9 Score 3 7 1  -       Assessment & Plan:     Routine Health Maintenance and Physical Exam  Exercise Activities and Dietary recommendations Goals   None     Immunization History  Administered Date(s) Administered  . Hepatitis B, adult 06/18/2015, 10/05/2015  . Pneumococcal Conjugate-13 08/14/2014  . Pneumococcal Polysaccharide-23 06/18/2015  . Pneumococcal-Unspecified 09/15/2004  . Td 09/16/2007    Health Maintenance  Topic Date Due  . TETANUS/TDAP  09/15/2017  . HEMOGLOBIN A1C  04/01/2019  . URINE MICROALBUMIN  07/05/2019 (Originally 04/01/2019)  . INFLUENZA VACCINE  04/16/2019  . OPHTHALMOLOGY EXAM  12/07/2019  . FOOT EXAM  12/31/2019  . COLONOSCOPY  06/04/2022  . PNEUMOCOCCAL POLYSACCHARIDE VACCINE AGE 72-64 HIGH RISK  Completed  . Hepatitis C Screening  Completed  . HIV Screening  Completed     Discussed health benefits of physical activity, and encouraged him to engage in regular exercise appropriate for his age and condition.    1. Annual physical exam Normal physical exam today. Will check labs as below and f/u pending lab results. If labs are stable and WNL he will not need to have these rechecked for one year at his next annual physical exam. He is to call the office in the meantime if he has any acute issue, questions or concerns. - CBC w/Diff/Platelet - Comprehensive Metabolic Panel (CMET) - TSH - HgB A1c - Lipid Profile - Fe+TIBC+Fer - PSA  2.  Prostate cancer screening No symptoms. No family history. Will check labs as below and f/u pending results. - PSA  3. Essential hypertension Stable. Diagnosis pulled for medication refill. Continue current medical treatment plan. Will check labs as below and f/u pending results. - CBC w/Diff/Platelet - Comprehensive Metabolic Panel (CMET) - metoprolol tartrate (LOPRESSOR) 25 MG tablet; TAKE 1 TABLET(25 MG) BY MOUTH TWICE DAILY  Dispense: 180 tablet; Refill: 1  4. OSA on CPAP Patient not using CPAP. Sleeping well.   5. Barrett's esophagus without dysplasia Stable. Last EGD was 2018. None rescheduled. Diagnosis pulled for medication refill. Continue current medical treatment plan. Will check labs as below and f/u pending results.  - CBC w/Diff/Platelet - Comprehensive Metabolic Panel (CMET) - omeprazole (PRILOSEC) 40 MG capsule; Take 1 capsule (40 mg total) by mouth daily.  Dispense: 90 capsule; Refill: 1  6. Type 2 diabetes mellitus without complication, without long-term current use of insulin (H[facility]) Stable. Diagnosis pulled for medication refill. Continue current medical treatment plan. Will check labs as below and f/u pending results. - CBC w/Diff/Platelet - Comprehensive Metabolic Panel (CMET) - HgB A1c - dapagliflozin propanediol (FARXIGA) 10 MG TABS tablet; Take 10 mg by mouth daily.  Dispense: 90 tablet; Refill: 1 - glipiZIDE (GLUCOTROL XL) 10 MG 24 hr tablet; TAKE 1 TABLET BY MOUTH EVERY DAY WITH BREAKFAST  Dispense: 90 tablet; Refill: 1 - metFORMIN (GLUCOPHAGE) 1000 MG tablet; Take 1 tablet (1,000 mg total) by mouth 2 (two) times daily with a meal.  Dispense: 180 tablet; Refill: 1 - sitaGLIPtin (JANUVIA) 100 MG tablet; TAKE 1 TABLET BY MOUTH DAILY  Dispense: 90 tablet; Refill: 1  7. Depression with anxiety Stable. Diagnosis pulled for medication refill. Continue current medical treatment plan. Will check labs as below and f/u pending results. - TSH - clonazePAM (KLONOPIN) 1 MG  tablet; Take 1 tablet (1 mg total) by mouth 2 (two) times daily as needed.  Dispense: 180 tablet; Refill: 1 - venlafaxine XR (EFFEXOR-XR) 150 MG 24 hr  capsule; TAKE 1 CAPSULE(150 MG) BY MOUTH DAILY WITH BREAKFAST  Dispense: 90 capsule; Refill: 1  8. Pure hypercholesterolemia Stable. Diagnosis pulled for medication refill. Continue current medical treatment plan. Will check labs as below and f/u pending results. - CBC w/Diff/Platelet - Comprehensive Metabolic Panel (CMET) - Lipid Profile - atorvastatin (LIPITOR) 40 MG tablet; Take 1 tablet (40 mg total) by mouth daily.  Dispense: 90 tablet; Refill: 1  9. Iron deficiency anemia due to chronic blood loss Stable. Had AVMs in 2018. Recheck labs as below.  - CBC w/Diff/Platelet - Fe+TIBC+Fer - ferrous sulfate 325 (65 FE) MG EC tablet; Take 1 tablet (325 mg total) by mouth 3 (three) times daily with meals.  Dispense: 90 tablet; Refill: 3  10. Need for Tdap vaccination Tdap Vaccine given to patient without complications. Patient sat for 15 minutes after administration and was tolerated well without adverse effects. - Tdap vaccine greater than or equal to 7yo IM  --------------------------------------------------------------------    Mar Daring, PA-C  [facility]

## 2019-04-04 ENCOUNTER — Other Ambulatory Visit: Payer: Self-pay

## 2019-04-04 ENCOUNTER — Encounter: Payer: Self-pay | Admitting: Physician Assistant

## 2019-04-04 ENCOUNTER — Ambulatory Visit (INDEPENDENT_AMBULATORY_CARE_PROVIDER_SITE_OTHER): Payer: BC Managed Care – PPO | Admitting: Physician Assistant

## 2019-04-04 VITALS — BP 112/60 | HR 69 | Temp 97.9°F | Resp 18 | Ht 70.0 in | Wt 169.6 lb

## 2019-04-04 DIAGNOSIS — K2270 Barrett's esophagus without dysplasia: Secondary | ICD-10-CM

## 2019-04-04 DIAGNOSIS — G4733 Obstructive sleep apnea (adult) (pediatric): Secondary | ICD-10-CM | POA: Diagnosis not present

## 2019-04-04 DIAGNOSIS — E7800 Pure hypercholesterolemia, unspecified: Secondary | ICD-10-CM

## 2019-04-04 DIAGNOSIS — D500 Iron deficiency anemia secondary to blood loss (chronic): Secondary | ICD-10-CM

## 2019-04-04 DIAGNOSIS — Z0000 Encounter for general adult medical examination without abnormal findings: Secondary | ICD-10-CM

## 2019-04-04 DIAGNOSIS — I10 Essential (primary) hypertension: Secondary | ICD-10-CM | POA: Diagnosis not present

## 2019-04-04 DIAGNOSIS — F418 Other specified anxiety disorders: Secondary | ICD-10-CM

## 2019-04-04 DIAGNOSIS — E119 Type 2 diabetes mellitus without complications: Secondary | ICD-10-CM

## 2019-04-04 DIAGNOSIS — Z23 Encounter for immunization: Secondary | ICD-10-CM | POA: Diagnosis not present

## 2019-04-04 DIAGNOSIS — Z125 Encounter for screening for malignant neoplasm of prostate: Secondary | ICD-10-CM

## 2019-04-04 DIAGNOSIS — Z9989 Dependence on other enabling machines and devices: Secondary | ICD-10-CM

## 2019-04-04 MED ORDER — ATORVASTATIN CALCIUM 40 MG PO TABS: 40.0000 mg | ORAL_TABLET | Freq: Every day | ORAL | 1 refills | Status: DC

## 2019-04-04 MED ORDER — CLONAZEPAM 1 MG PO TABS: 1.0000 mg | ORAL_TABLET | Freq: Two times a day (BID) | ORAL | 1 refills | Status: DC | PRN

## 2019-04-04 MED ORDER — METFORMIN HCL 1000 MG PO TABS: 1000.0000 mg | ORAL_TABLET | Freq: Two times a day (BID) | ORAL | 1 refills | Status: DC

## 2019-04-04 MED ORDER — GLIPIZIDE ER 10 MG PO TB24: ORAL_TABLET | ORAL | 1 refills | Status: DC

## 2019-04-04 MED ORDER — METOPROLOL TARTRATE 25 MG PO TABS: ORAL_TABLET | ORAL | 1 refills | Status: DC

## 2019-04-04 MED ORDER — FARXIGA 10 MG PO TABS: 10.0000 mg | ORAL_TABLET | Freq: Every day | ORAL | 1 refills | Status: DC

## 2019-04-04 MED ORDER — VENLAFAXINE HCL ER 150 MG PO CP24: ORAL_CAPSULE | ORAL | 1 refills | Status: DC

## 2019-04-04 MED ORDER — OMEPRAZOLE 40 MG PO CPDR: 40.0000 mg | DELAYED_RELEASE_CAPSULE | Freq: Every day | ORAL | 1 refills | Status: DC

## 2019-04-04 MED ORDER — FERROUS SULFATE 325 (65 FE) MG PO TBEC: 325.0000 mg | DELAYED_RELEASE_TABLET | Freq: Three times a day (TID) | ORAL | 3 refills | Status: DC

## 2019-04-04 MED ORDER — SITAGLIPTIN PHOSPHATE 100 MG PO TABS: ORAL_TABLET | ORAL | 1 refills | Status: DC

## 2019-04-04 NOTE — Patient Instructions (Signed)

## 2019-04-05 DIAGNOSIS — I10 Essential (primary) hypertension: Secondary | ICD-10-CM | POA: Diagnosis not present

## 2019-04-05 DIAGNOSIS — D500 Iron deficiency anemia secondary to blood loss (chronic): Secondary | ICD-10-CM | POA: Diagnosis not present

## 2019-04-05 DIAGNOSIS — E119 Type 2 diabetes mellitus without complications: Secondary | ICD-10-CM | POA: Diagnosis not present

## 2019-04-05 DIAGNOSIS — K2270 Barrett's esophagus without dysplasia: Secondary | ICD-10-CM | POA: Diagnosis not present

## 2019-04-05 DIAGNOSIS — Z0000 Encounter for general adult medical examination without abnormal findings: Secondary | ICD-10-CM | POA: Diagnosis not present

## 2019-04-05 DIAGNOSIS — E7800 Pure hypercholesterolemia, unspecified: Secondary | ICD-10-CM | POA: Diagnosis not present

## 2019-04-06 ENCOUNTER — Telehealth: Payer: Self-pay

## 2019-04-06 LAB — COMPREHENSIVE METABOLIC PANEL
ALT: 27 IU/L (ref 0–44)
AST: 18 IU/L (ref 0–40)
Albumin/Globulin Ratio: 2.3 — ABNORMAL HIGH (ref 1.2–2.2)
Albumin: 4.9 g/dL (ref 3.8–4.9)
Alkaline Phosphatase: 68 IU/L (ref 39–117)
BUN/Creatinine Ratio: 15 (ref 9–20)
BUN: 11 mg/dL (ref 6–24)
Bilirubin Total: 0.4 mg/dL (ref 0.0–1.2)
CO2: 24 mmol/L (ref 20–29)
Calcium: 9.6 mg/dL (ref 8.7–10.2)
Chloride: 96 mmol/L (ref 96–106)
Creatinine, Ser: 0.74 mg/dL — ABNORMAL LOW (ref 0.76–1.27)
GFR calc Af Amer: 117 mL/min/{1.73_m2} (ref >59)
GFR calc non Af Amer: 101 mL/min/{1.73_m2} (ref >59)
Globulin, Total: 2.1 g/dL (ref 1.5–4.5)
Glucose: 95 mg/dL (ref 65–99)
Potassium: 4.2 mmol/L (ref 3.5–5.2)
Sodium: 139 mmol/L (ref 134–144)
Total Protein: 7 g/dL (ref 6.0–8.5)

## 2019-04-06 LAB — LIPID PANEL
Chol/HDL Ratio: 2.9 ratio (ref 0.0–5.0)
Cholesterol, Total: 109 mg/dL (ref 100–199)
HDL: 37 mg/dL — ABNORMAL LOW (ref >39)
LDL Calculated: 22 mg/dL (ref 0–99)
Triglycerides: 249 mg/dL — ABNORMAL HIGH (ref 0–149)
VLDL Cholesterol Cal: 50 mg/dL — ABNORMAL HIGH (ref 5–40)

## 2019-04-06 LAB — CBC WITH DIFFERENTIAL/PLATELET
Basophils Absolute: 0.1 10*3/uL (ref 0.0–0.2)
Basos: 1 %
EOS (ABSOLUTE): 0.5 10*3/uL — ABNORMAL HIGH (ref 0.0–0.4)
Eos: 7 %
Hematocrit: 44.5 % (ref 37.5–51.0)
Hemoglobin: 15 g/dL (ref 13.0–17.7)
Immature Grans (Abs): 0 10*3/uL (ref 0.0–0.1)
Immature Granulocytes: 0 %
Lymphocytes Absolute: 1.4 10*3/uL (ref 0.7–3.1)
Lymphs: 18 %
MCH: 29.1 pg (ref 26.6–33.0)
MCHC: 33.7 g/dL (ref 31.5–35.7)
MCV: 86 fL (ref 79–97)
Monocytes Absolute: 0.5 10*3/uL (ref 0.1–0.9)
Monocytes: 7 %
Neutrophils Absolute: 5.2 10*3/uL (ref 1.4–7.0)
Neutrophils: 67 %
Platelets: 256 10*3/uL (ref 150–450)
RBC: 5.15 x10E6/uL (ref 4.14–5.80)
RDW: 14.2 % (ref 11.6–15.4)
WBC: 7.9 10*3/uL (ref 3.4–10.8)

## 2019-04-06 LAB — TSH: TSH: 2.89 u[IU]/mL (ref 0.450–4.500)

## 2019-04-06 LAB — IRON,TIBC AND FERRITIN PANEL
Ferritin: 62 ng/mL (ref 30–400)
Iron Saturation: 29 % (ref 15–55)
Iron: 87 ug/dL (ref 38–169)
Total Iron Binding Capacity: 296 ug/dL (ref 250–450)
UIBC: 209 ug/dL (ref 111–343)

## 2019-04-06 LAB — HEMOGLOBIN A1C
Est. average glucose Bld gHb Est-mCnc: 143 mg/dL
Hgb A1c MFr Bld: 6.6 % — ABNORMAL HIGH (ref 4.8–5.6)

## 2019-04-06 LAB — PSA: Prostate Specific Ag, Serum: 0.4 ng/mL (ref 0.0–4.0)

## 2019-04-06 NOTE — Telephone Encounter (Signed)
Viewed by Reche Dixon [place] on 04/06/2019 9:42 AM Written by Mar Daring, PA-C on 04/06/2019 9:03 AM Blood count is normal. Kidney and liver function is normal. Sodium, potassium and calcium are normal. Thyroid is normal. A1c is still under goal at 6.6. Did increase slightly from 6.3. Cholesterol is normal. Iron is holding steady and is normal. PSA is normal.

## 2019-04-06 NOTE — Telephone Encounter (Signed)
-----   Message from Mar Daring, [place] sent at 04/06/2019  9:03 AM EDT ----- Blood count is normal. Kidney and liver function is normal. Sodium, potassium and calcium are normal. Thyroid is normal. A1c is still under goal at 6.6. Did increase slightly from 6.3. Cholesterol is normal. Iron is holding steady and is normal. PSA is normal.

## 2019-05-11 ENCOUNTER — Ambulatory Visit: Payer: BC Managed Care – PPO | Admitting: Physician Assistant

## 2019-05-25 ENCOUNTER — Other Ambulatory Visit: Payer: Self-pay | Admitting: Physician Assistant

## 2019-05-25 DIAGNOSIS — F418 Other specified anxiety disorders: Secondary | ICD-10-CM

## 2019-06-08 IMAGING — CT CT HEAD W/O CM
3 of 4 series · 14 of 47 positions shown, 16 images · non-contrast
Comparison: None.

CLINICAL DATA: Pt c/o worsening H/A's from the level of his
eyebrows up towards the crown of his head x 2 weeks. He states he
had episode of anger and Caal and then this pain has now continued.
He states he lost focus, had intermittent blurry vision. Confusion.

EXAM:
CT HEAD WITHOUT CONTRAST
TECHNIQUE: Contiguous axial images were obtained from the base of the skull
through the vertex without intravenous contrast.

[Series 3: head · axial · 0.34mm/px · z∈[-639,-519]mm · 8 of 30 slices shown, 10 images (1 of 3)]
[im 3/30  brain]
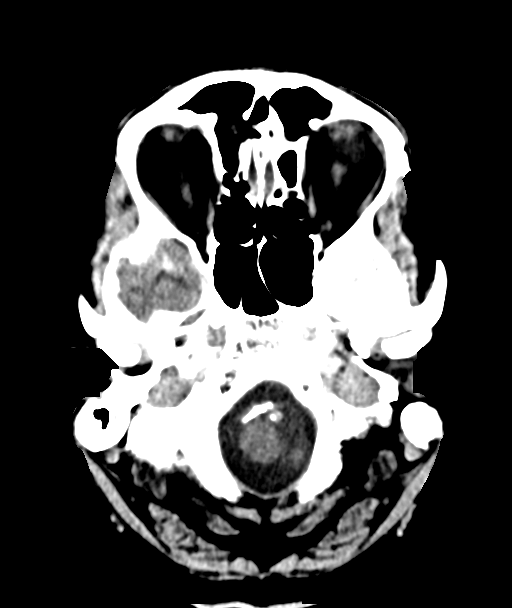
[im 3/30  bone]
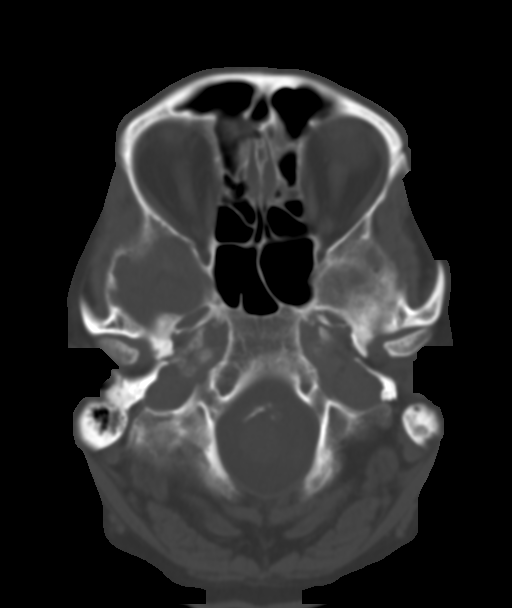
[im 7/30  brain]
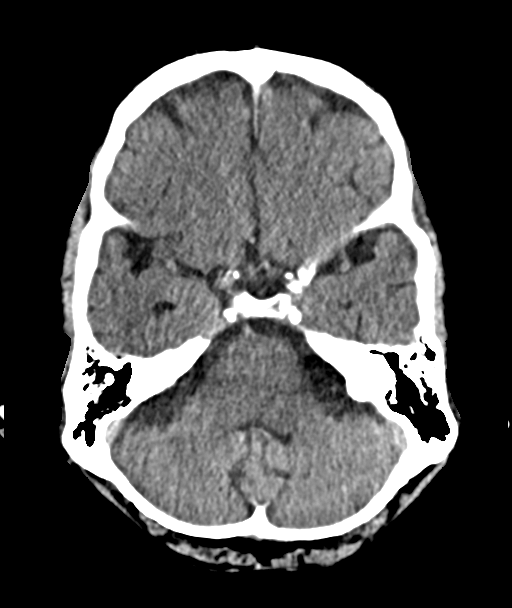
[im 11/30  brain]
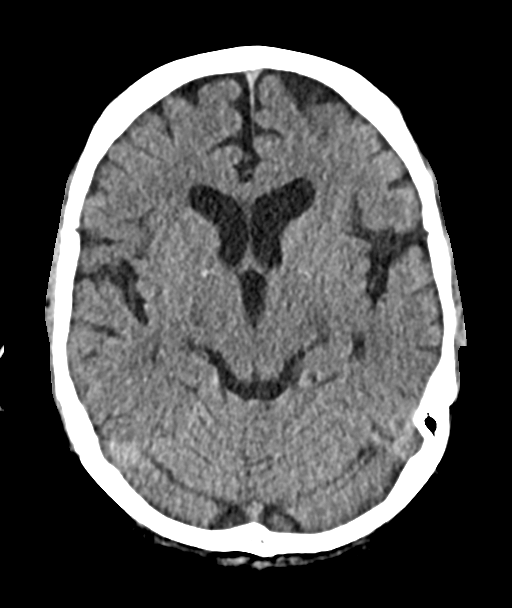
[im 13/30  brain]
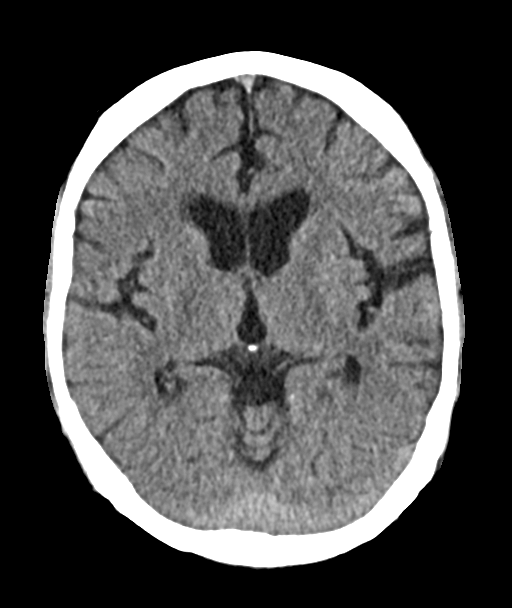
[im 17/30  brain]
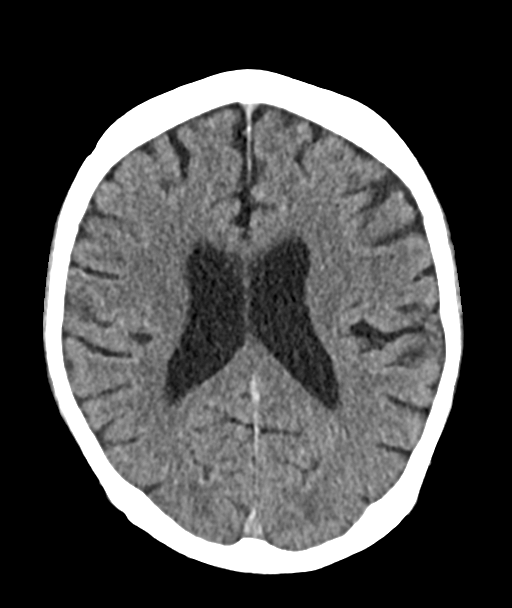
[im 17/30  bone]
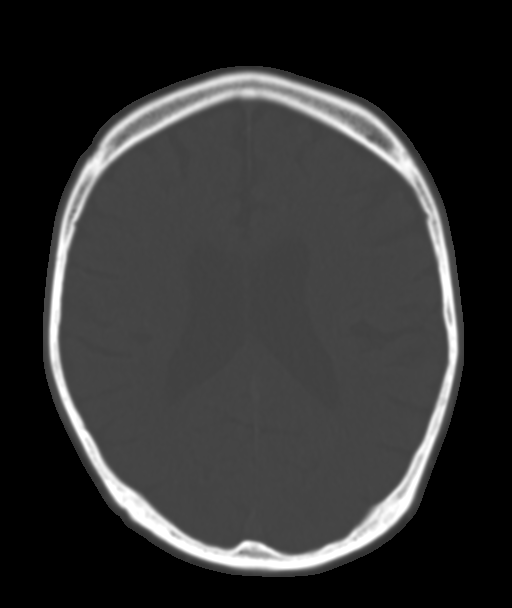
[im 19/30  brain]
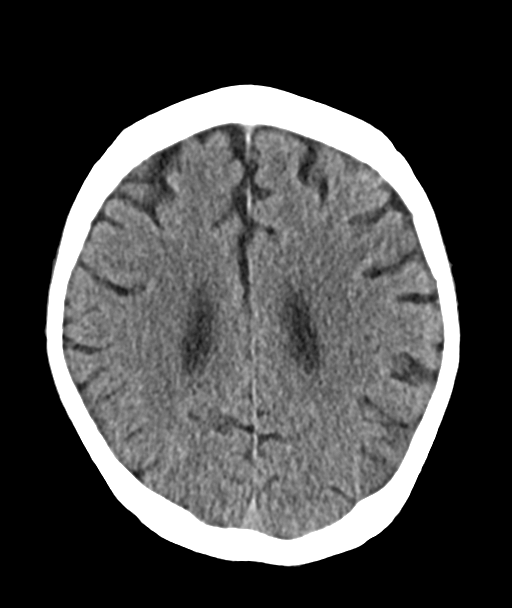
[im 23/30  brain]
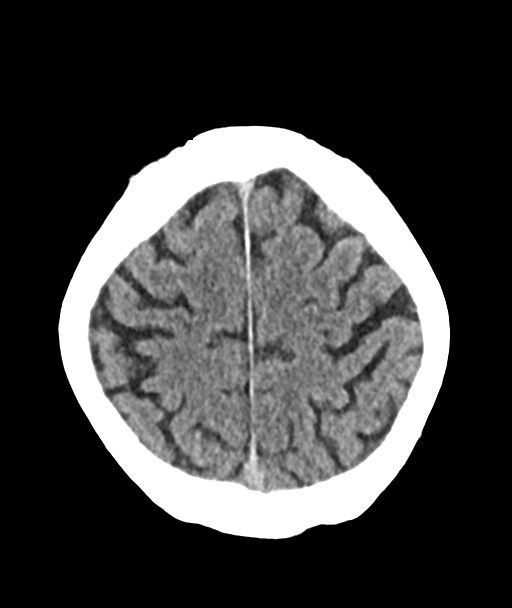
[im 27/30  brain]
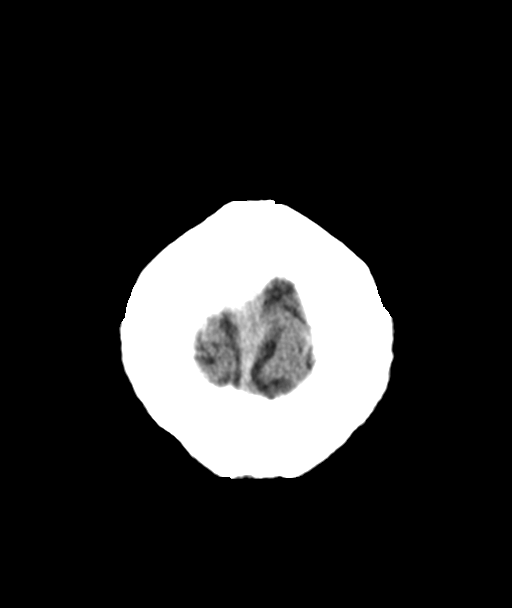

[Series 7: head · coronal · 0.29mm/px · 3 of 69 slices shown (2 of 3)]
[im 23/69  brain]
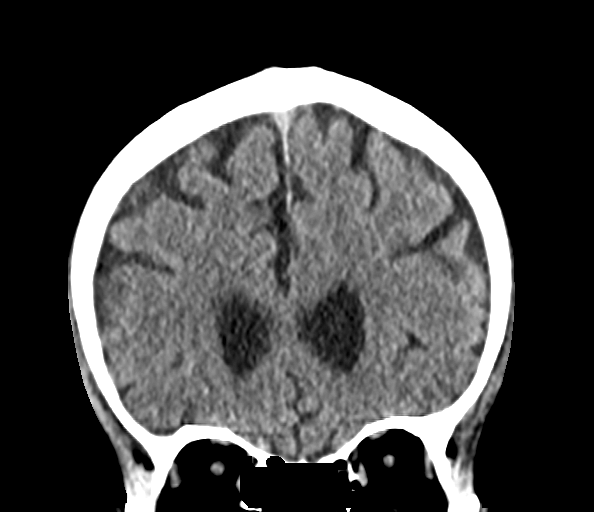
[im 31/69  brain]
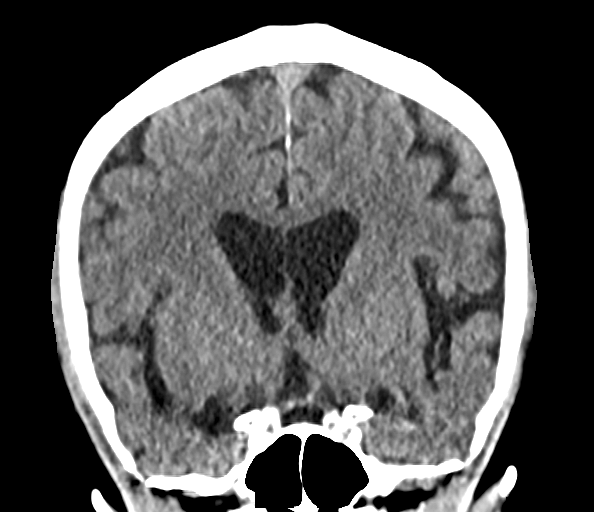
[im 38/69  brain]
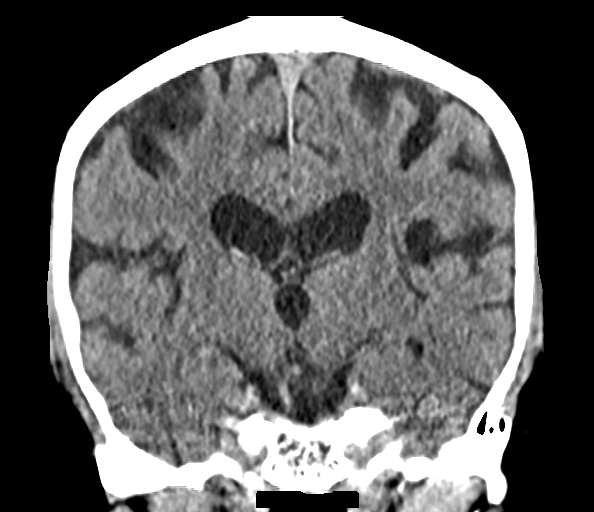

[Series 9: head · sagittal · 0.29mm/px · 3 of 58 slices shown (3 of 3)]
[im 20/58  brain]
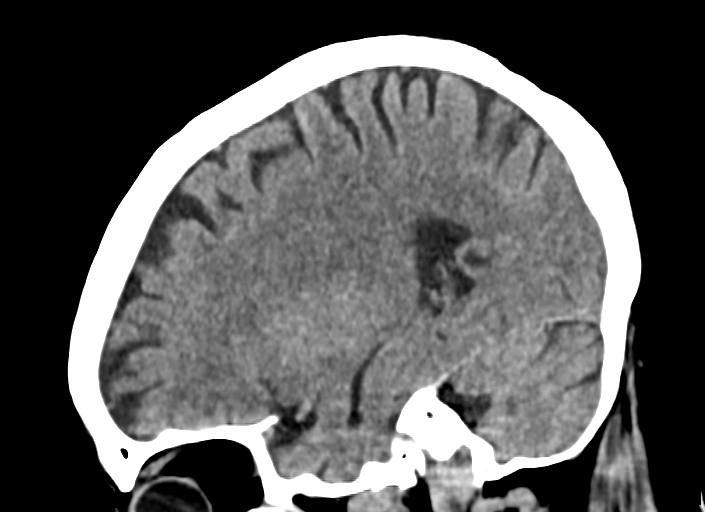
[im 29/58  brain]
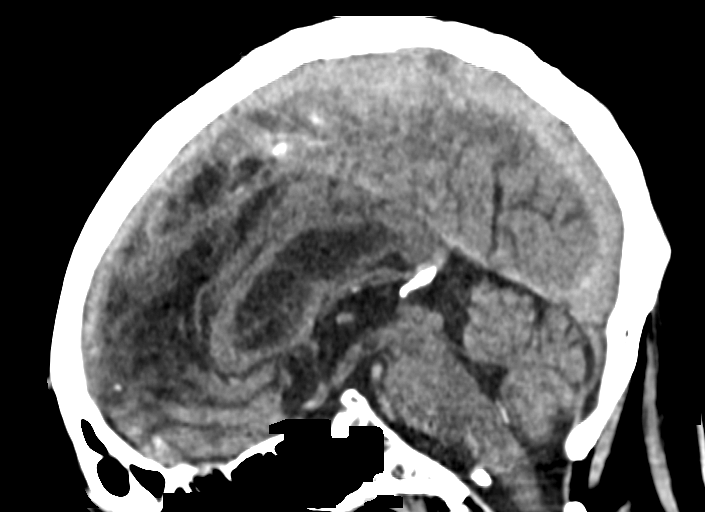
[im 39/58  brain]
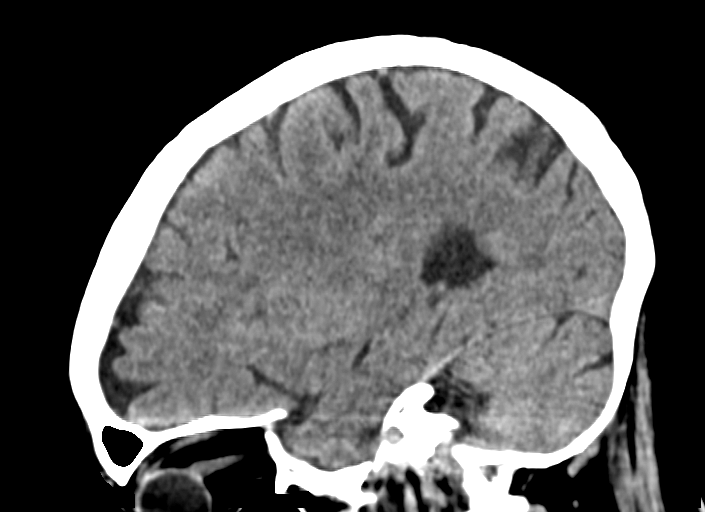

[14 of 47 positions shown; findings below may reference images not displayed]

FINDINGS: Brain: No evidence of acute infarction, hemorrhage, hydrocephalus,
extra-axial collection or mass lesion/mass effect.

Vascular: There is dense atherosclerotic calcification of the
internal carotid arteries.

Skull: Normal. Negative for fracture or focal lesion.

Sinuses/Orbits: No acute finding.

Other: None
IMPRESSION: No evidence for acute intracranial abnormality.

## 2019-09-17 ENCOUNTER — Other Ambulatory Visit: Payer: Self-pay | Admitting: Physician Assistant

## 2019-09-17 DIAGNOSIS — E119 Type 2 diabetes mellitus without complications: Secondary | ICD-10-CM

## 2019-09-24 ENCOUNTER — Other Ambulatory Visit: Payer: Self-pay | Admitting: Physician Assistant

## 2019-09-24 DIAGNOSIS — K2270 Barrett's esophagus without dysplasia: Secondary | ICD-10-CM

## 2019-10-11 ENCOUNTER — Other Ambulatory Visit: Payer: Self-pay | Admitting: Physician Assistant

## 2019-10-11 DIAGNOSIS — E119 Type 2 diabetes mellitus without complications: Secondary | ICD-10-CM

## 2019-10-11 NOTE — Telephone Encounter (Signed)
Requested medication (s) are due for refill today: yes  Requested medication (s) are on the active medication list: yes  Last refill:  09/13/2019  Future visit scheduled: no  Notes to clinic:  no valid encounter within last 6 months Review for refill   Requested Prescriptions  Pending Prescriptions Disp Refills   JANUVIA 100 MG tablet [Pharmacy Med Name: JANUVIA 100MG  TABLETS] 90 tablet 1    Sig: TAKE 1 TABLET BY MOUTH DAILY      Endocrinology:  Diabetes - DPP-4 Inhibitors Failed - 10/11/2019  3:38 AM      Failed - HBA1C is between 0 and 7.9 and within 180 days    Hgb A1c MFr Bld  Date Value Ref Range Status  04/05/2019 6.6 (H) 4.8 - 5.6 % Final    Comment:             Prediabetes: 5.7 - 6.4          Diabetes: >6.4          Glycemic control for adults with diabetes: <7.0           Failed - Cr in normal range and within 360 days    Creat  Date Value Ref Range Status  06/18/2015 0.76 0.70 - 1.33 mg/dL Final   Creatinine, Ser  Date Value Ref Range Status  04/05/2019 0.74 (L) 0.76 - 1.27 mg/dL Final   Creatinine, Urine  Date Value Ref Range Status  05/15/2014 230.5 mg/dL Final          Failed - Valid encounter within last 6 months    Recent Outpatient Visits           6 months ago Annual physical exam   [facility] F[place] Malling M, PA-C   9 months ago Type 2 diabetes mellitus without complication, without long-term current use of insulin [facility])   [place], [place], [place]   11 months ago Nonintractable headache, unspecified chronicity pattern, unspecified headache type   [facility] [place], [place], PA-C   1 year ago Type 2 diabetes mellitus without complication, without long-term current use of insulin [facility])   [facility] [place], Clearnce Sorrel, [place]   1 year ago [place], [place], [place]

## 2019-10-22 ENCOUNTER — Other Ambulatory Visit: Payer: Self-pay | Admitting: Physician Assistant

## 2019-10-22 DIAGNOSIS — E7800 Pure hypercholesterolemia, unspecified: Secondary | ICD-10-CM

## 2019-10-22 NOTE — Telephone Encounter (Signed)
Requested Prescriptions  Pending Prescriptions Disp Refills  . atorvastatin (LIPITOR) 40 MG tablet [Pharmacy Med Name: ATORVASTATIN 40MG  TABLETS] 90 tablet 1    Sig: TAKE 1 TABLET(40 MG) BY MOUTH DAILY     Cardiovascular:  Antilipid - Statins Failed - 10/22/2019 11:02 AM      Failed - HDL in normal range and within 360 days    HDL  Date Value Ref Range Status  04/05/2019 37 (L) >39 mg/dL Final         Failed - Triglycerides in normal range and within 360 days    Triglycerides  Date Value Ref Range Status  04/05/2019 249 (H) 0 - 149 mg/dL Final         Passed - Total Cholesterol in normal range and within 360 days    Cholesterol, Total  Date Value Ref Range Status  04/05/2019 109 100 - 199 mg/dL Final         Passed - LDL in normal range and within 360 days    LDL Calculated  Date Value Ref Range Status  04/05/2019 22 0 - 99 mg/dL Final         Passed - Patient is not pregnant      Passed - Valid encounter within last 12 months    Recent Outpatient Visits          6 months ago Annual physical exam   [facility] Fenton Malling M, PA-C   9 months ago Type 2 diabetes mellitus without complication, without long-term current use of insulin [facility])   [place], [place] M, [place]   11 months ago Nonintractable headache, unspecified chronicity pattern, unspecified headache type   [facility] [place], [place], PA-C   1 year ago Type 2 diabetes mellitus without complication, without long-term current use of insulin [facility])   [facility] [place], Clearnce Sor[place], [place]   1 year ago [facility] [place], [place], [place]

## 2019-11-15 ENCOUNTER — Other Ambulatory Visit: Payer: Self-pay | Admitting: Physician Assistant

## 2019-11-15 DIAGNOSIS — F418 Other specified anxiety disorders: Secondary | ICD-10-CM

## 2019-11-15 NOTE — Telephone Encounter (Signed)
Requested medication (s) are due for refill today: yes  Requested medication (s) are on the active medication list: yes  Last refill:  10/18/2019  Future visit scheduled: no  Notes to clinic:  this refill cannot be delegated    Requested Prescriptions  Pending Prescriptions Disp Refills   clonazePAM (KLONOPIN) 1 MG tablet [Pharmacy Med Name: CLONAZEPAM 1MG  TABLETS] 60 tablet     Sig: TAKE 1 TABLET BY MOUTH TWICE DAILY AS NEEDED      Not Delegated - Psychiatry:  Anxiolytics/Hypnotics Failed - 11/15/2019  3:38 AM      Failed - This refill cannot be delegated      Failed - Urine Drug Screen completed in last 360 days.      Failed - Valid encounter within last 6 months    Recent Outpatient Visits           7 months ago Annual physical exam   [facility] Fenton Malling M, [place]   10 months ago Type 2 diabetes mellitus without complication, without long-term current use of insulin [facility])   [facility] [place], [place], [place]   1 year ago Nonintractable headache, unspecified chronicity pattern, unspecified headache type   [facility] [place], [place], PA-C   1 year ago Type 2 diabetes mellitus without complication, without long-term current use of insulin [facility])   [facility] [place], Clearnce [place], [place]   1 year ago [facility] [place], [place], [place]

## 2019-12-15 ENCOUNTER — Other Ambulatory Visit: Payer: Self-pay | Admitting: Physician Assistant

## 2019-12-15 DIAGNOSIS — E119 Type 2 diabetes mellitus without complications: Secondary | ICD-10-CM

## 2019-12-15 NOTE — Telephone Encounter (Signed)
Requested medication (s) are due for refill today: yes  Requested medication (s) are on the active medication list: yes  Last refill:  04/04/19  Future visit scheduled: No  Notes to clinic:  Attempted to contact patient to schedule appointment. Unable to leave message VM is full. A1C last completed 04/05/19    Requested Prescriptions  Pending Prescriptions Disp Refills   glipiZIDE (GLUCOTROL XL) 10 MG 24 hr tablet [Pharmacy Med Name: GLIPIZIDE ER 10MG  TABLETS] 90 tablet 1    Sig: TAKE 1 TABLET BY MOUTH EVERY DAY WITH BREAKFAST      Endocrinology:  Diabetes - Sulfonylureas Failed - 12/15/2019  1:45 PM      Failed - HBA1C is between 0 and 7.9 and within 180 days    Hgb A1c MFr Bld  Date Value Ref Range Status  04/05/2019 6.6 (H) 4.8 - 5.6 % Final    Comment:             Prediabetes: 5.7 - 6.4          Diabetes: >6.4          Glycemic control for adults with diabetes: <7.0           Failed - Valid encounter within last 6 months    Recent Outpatient Visits           8 months ago Annual physical exam   [facility] [place] Malling M, PA-C   11 months ago Type 2 diabetes mellitus without complication, without long-term current use of insulin [facility])   [place], [place], [place]   1 year ago Nonintractable headache, unspecified chronicity pattern, unspecified headache type   [facility], [place], PA-C   1 year ago Type 2 diabetes mellitus without complication, without long-term current use of insulin [facility])   [facility] [place], Clearnce Sorrel, [place]   1 year ago [facility] [place], [place], [place]

## 2019-12-15 NOTE — Telephone Encounter (Signed)
Attempted to contact patient to schedule OV for medication refills. Unable to leave message mail box full.

## 2020-01-17 ENCOUNTER — Other Ambulatory Visit: Payer: Self-pay | Admitting: Physician Assistant

## 2020-01-17 DIAGNOSIS — E119 Type 2 diabetes mellitus without complications: Secondary | ICD-10-CM

## 2020-01-17 DIAGNOSIS — I10 Essential (primary) hypertension: Secondary | ICD-10-CM

## 2020-01-17 NOTE — Telephone Encounter (Signed)
Requested medication (s) are due for refill today:yes  Requested medication (s) are on the active medication list: yes  Last refill:  10/22/2019  Future visit scheduled:yes  Notes to clinic: Patient has appointment on 01/20/2020 Is it okay to fill   Requested Prescriptions  Pending Prescriptions Disp Refills   metFORMIN (GLUCOPHAGE) 1000 MG tablet [Pharmacy Med Name: METFORMIN 1000MG TABLETS] 180 tablet 1    Sig: TAKE 1 TABLET(1000 MG) BY MOUTH TWICE DAILY WITH A MEAL      Endocrinology:  Diabetes - Biguanides Failed - 01/17/2020 11:08 AM      Failed - Cr in normal range and within 360 days    Creat  Date Value Ref Range Status  06/18/2015 0.76 0.70 - 1.33 mg/dL Final   Creatinine, Ser  Date Value Ref Range Status  04/05/2019 0.74 (L) 0.76 - 1.27 mg/dL Final   Creatinine, Urine  Date Value Ref Range Status  05/15/2014 230.5 mg/dL Final          Failed - HBA1C is between 0 and 7.9 and within 180 days    Hgb A1c MFr Bld  Date Value Ref Range Status  04/05/2019 6.6 (H) 4.8 - 5.6 % Final    Comment:             Prediabetes: 5.7 - 6.4          Diabetes: >6.4          Glycemic control for adults with diabetes: <7.0           Failed - Valid encounter within last 6 months    Recent Outpatient Visits           9 months ago Annual physical exam   [place], [place] M, PA-C   1 year ago Type 2 diabetes mellitus without complication, without long-term current use of insulin [facility])   [facility], [place], [place]   1 year ago Nonintractable headache, unspecified chronicity pattern, unspecified headache type   [facility] [place] [place], Adriana M, PA-C   1 year ago Type 2 diabetes mellitus without complication, without long-term current use of insulin [facility])   [place], Clearnce Sorrel, [place]   1 year ago [employer], Wendee Beavers, PA-C       Future Appointments              In 3 days Marlyn Corporal, Clearnce [place], PA-C [employer], [place]   In 3 weeks Hollice Espy, MD [facility]            Passed - eGFR in normal range and within 360 days    GFR, Est African American  Date Value Ref Range Status  08/14/2014 >89 mL/min Final   GFR calc Af Amer  Date Value Ref Range Status  04/05/2019 117 >59 mL/min/1.73 Final   GFR, Est Non African American  Date Value Ref Range Status  08/14/2014 >89 mL/min Final    Comment:      The estimated GFR is a calculation valid for adults (>=16 years old) that uses the CKD-EPI algorithm to adjust for age and sex. It is   not to be used for children, pregnant women, hospitalized patients,    patients on dialysis, or with rapidly changing kidney function. According to the NKDEP, eGFR >89 is normal, 60-89 shows mild impairment, 30-59 shows moderate impairment, 15-29 shows severe impairment and <15 is ESRD.      GFR  calc non Af Amer  Date Value Ref Range Status  04/05/2019 101 >59 mL/min/1.73 Final            metoprolol tartrate (LOPRESSOR) 25 MG tablet [Pharmacy Med Name: METOPROLOL TARTRATE 25MG TABLETS] 180 tablet 1    Sig: TAKE 1 TABLET(25 MG) BY MOUTH TWICE DAILY      Cardiovascular:  Beta Blockers Failed - 01/17/2020 11:08 AM      Failed - Valid encounter within last 6 months    Recent Outpatient Visits           9 months ago Annual physical exam   [facility], [place] M, PA-C   1 year ago Type 2 diabetes mellitus without complication, without long-term current use of insulin [facility])   [facility] [place], [place], [place]   1 year ago Nonintractable headache, unspecified chronicity pattern, unspecified headache type   [facility] [place], [place], PA-C   1 year ago Type 2 diabetes mellitus without complication, without long-term current use of insulin [facility])   [place], Clearnce Sorrel,  [place]   1 year ago [employer], Wendee Beavers, PA-C       Future Appointments             In 3 days Marlyn Corporal, Clearn[place], PA-C [employer], [place]   In 3 weeks Hollice Espy, MD [facility]            Passed - Last BP in normal range    BP Readings from Last 1 Encounters:  04/04/19 112/60          Passed - Last Heart Rate in normal range    Pulse Readings from Last 1 Encounters:  04/04/19 69

## 2020-01-19 NOTE — Progress Notes (Signed)
Established patient visit   Patient: Joseph Terry   DOB: 11/18/1960   59 y.o. Male  MRN: [id number] Visit Date: 01/20/2020  Today's healthcare provider: Mar Daring, PA-C   Chief Complaint  Patient presents with  . Follow-up    T2DM   Subjective    HPI  Diabetes Mellitus Type II, Follow-up  Lab Results  Component Value Date   HGBA1C 7.0 (A) 01/20/2020   HGBA1C 6.6 (H) 04/05/2019   HGBA1C 6.3 (A) 12/31/2018   Last seen for diabetes 9 months ago.  Management since then includes none. He reports excellent compliance with treatment. He is not having side effects.   Symptoms: No fatigue No foot ulcerations No appetite changes No nausea No paresthesia (numbness or tingling) of the feet  No polydipsia (excessive thirst) No polyuria (frequent urination) No visual disturbances  No vomiting  Home blood sugar records: not checking  Episodes of hypoglycemia? No Reports that once every couple of months he gets shaky and sweaty but knows what to do. Reports that it is not frequently.   Current insulin regiment: none Most Recent Eye Exam: last week [facility]. Current exercise: none  Pertinent Labs: Lab Results  Component Value Date   CHOL 109 04/05/2019   HDL 37 (L) 04/05/2019   LDLCALC 22 04/05/2019   TRIG 249 (H) 04/05/2019   CHOLHDL 2.9 04/05/2019   Lab Results  Component Value Date   NA 139 04/05/2019   K 4.2 04/05/2019   CO2 24 04/05/2019   GLUCOSE 95 04/05/2019   BUN 11 04/05/2019   CREATININE 0.74 (L) 04/05/2019   CALCIUM 9.6 04/05/2019   GFRNONAA 101 04/05/2019   GFRAA 117 04/05/2019     Wt Readings from Last 3 Encounters:  01/20/20 171 lb 3.2 oz (77.7 kg)  04/04/19 169 lb 9.6 oz (76.9 kg)  12/31/18 167 lb 9.6 oz (76 kg)   ---------------------------------------------------------------------------------------------------  Patient Active Problem List   Diagnosis Date Noted  . Essential (hemorrhagic) thrombocythemia  ([place]) 01/20/2020  . Blood loss anemia   . Polyp of sigmoid colon   . Iron deficiency anemia due to chronic blood loss 04/15/2017  . Type 2 diabetes mellitus without complication, without long-term current use of insulin ([place]) 09/26/2016  . Anemia 12/26/2013  . Barrett's esophagus 12/01/2013  . Personal history of colonic polyps 10/19/2013  . OSA on CPAP 09/20/2013  . GERD (gastroesophageal reflux disease) 02/28/2013  . Coronary artery disease 08/30/2012  . Depression with anxiety 05/31/2012  . Hypertension   . Hyperlipidemia   . History of tobacco abuse 01/06/2012  . S/P CABG x 5 01/05/2012  . Unstable angina ([place]) 01/02/2012  . Cardiovascular stress test abnormal 01/02/2012  . CAD at cath, Nl LVF, severe multivessel CAD surgical consult for CABG 01/02/2012   Past Medical History:  Diagnosis Date  . Anxiety state, unspecified   . Barrett's esophagus   . CAD (coronary artery disease), severe multivessel CAD surgical consult for CABG    a. 12/2011 Cath: 3 vessel CAD with normal EF;  b. 12/2011 CABG x 5: LIMA to LAD, SVG to distal RCA with sequential SVG to PDA, SVG to OM, and SVG to D2;  c. 03/2014 ETT: Ex time 6:21, 30mm horizontal ST dep in V3-V6, HTN response.  . Depression   . Diaphragmatic hernia without mention of obstruction or gangrene   . Diverticulosis   . GERD (gastroesophageal reflux disease)   . Hyperlipidemia   . Hypertension   .  Impotence of organic origin   . Internal hemorrhoids without mention of complication   . Iron deficiency anemia due to chronic blood loss 04/15/2017  . Kidney stone   . Motion sickness    cars, boats  . OSA on CPAP    a. Sleep study 07/2013 +sever OSA; CPAP at 10cm recommended.  . Personal history of colonic polyps   . Type II diabetes mellitus ([place])   . Wears contact lenses        Medications: Outpatient Medications Prior to Visit  Medication Sig  . atorvastatin (LIPITOR) 40 MG tablet TAKE 1 TABLET(40 MG) BY MOUTH DAILY  . clonazePAM  (KLONOPIN) 1 MG tablet TAKE 1 TABLET BY MOUTH TWICE DAILY AS NEEDED  . ferrous sulfate 325 (65 FE) MG EC tablet Take 1 tablet (325 mg total) by mouth 3 (three) times daily with meals.  Marland Kitchen glipiZIDE (GLUCOTROL XL) 10 MG 24 hr tablet TAKE 1 TABLET BY MOUTH EVERY DAY WITH BREAKFAST  . metFORMIN (GLUCOPHAGE) 1000 MG tablet TAKE 1 TABLET(1000 MG) BY MOUTH TWICE DAILY WITH A MEAL  . metoprolol tartrate (LOPRESSOR) 25 MG tablet TAKE 1 TABLET(25 MG) BY MOUTH TWICE DAILY  . omeprazole (PRILOSEC) 40 MG capsule TAKE 1 CAPSULE(40 MG) BY MOUTH DAILY  . sitaGLIPtin (JANUVIA) 100 MG tablet TAKE 1 TABLET BY MOUTH DAILY  . [DISCONTINUED] FARXIGA 10 MG TABS tablet TAKE 1 TABLET BY MOUTH DAILY  . [DISCONTINUED] venlafaxine XR (EFFEXOR-XR) 150 MG 24 hr capsule TAKE 1 CAPSULE(150 MG) BY MOUTH DAILY WITH BREAKFAST  . [DISCONTINUED] cyclobenzaprine (FLEXERIL) 5 MG tablet Take 1 tablet (5 mg total) by mouth 3 (three) times daily as needed for muscle spasms.   No facility-administered medications prior to visit.    Review of Systems  Last CBC Lab Results  Component Value Date   WBC 7.9 04/05/2019   HGB 15.0 04/05/2019   HCT 44.5 04/05/2019   MCV 86 04/05/2019   MCH 29.1 04/05/2019   RDW 14.2 04/05/2019   PLT 256 99991111   Last metabolic panel Lab Results  Component Value Date   GLUCOSE 95 04/05/2019   NA 139 04/05/2019   K 4.2 04/05/2019   CL 96 04/05/2019   CO2 24 04/05/2019   BUN 11 04/05/2019   CREATININE 0.74 (L) 04/05/2019   GFRNONAA 101 04/05/2019   GFRAA 117 04/05/2019   CALCIUM 9.6 04/05/2019   PROT 7.0 04/05/2019   ALBUMIN 4.9 04/05/2019   LABGLOB 2.1 04/05/2019   AGRATIO 2.3 (H) 04/05/2019   BILITOT 0.4 04/05/2019   ALKPHOS 68 04/05/2019   AST 18 04/05/2019   ALT 27 04/05/2019   ANIONGAP 11 11/25/2017      Objective    BP 116/75 (BP Location: Left Arm, Patient Position: Sitting, Cuff Size: Large)   Pulse 69   Temp (!) 97 F (36.1 C) (Temporal)   Resp 16   Wt 171 lb 3.2 oz  (77.7 kg)   BMI 24.56 kg/m  BP Readings from Last 3 Encounters:  01/20/20 116/75  04/04/19 112/60  12/31/18 122/70   Wt Readings from Last 3 Encounters:  01/20/20 171 lb 3.2 oz (77.7 kg)  04/04/19 169 lb 9.6 oz (76.9 kg)  12/31/18 167 lb 9.6 oz (76 kg)      Physical Exam Vitals reviewed.  Constitutional:      General: He is not in acute distress.    Appearance: Normal appearance. He is well-developed. He is not diaphoretic.  HENT:     Head: Normocephalic and  atraumatic.  Cardiovascular:     Rate and Rhythm: Normal rate and regular rhythm.     Pulses: Normal pulses.     Heart sounds: Normal heart sounds. No murmur. No friction rub. No gallop.   Pulmonary:     Effort: Pulmonary effort is normal. No respiratory distress.     Breath sounds: Normal breath sounds. No wheezing or rales.  Musculoskeletal:     Cervical back: Normal range of motion and neck supple.     Right lower leg: No edema.     Left lower leg: No edema.  Skin:    General: Skin is warm and dry.     Capillary Refill: Capillary refill takes less than 2 seconds.  Neurological:     General: No focal deficit present.     Mental Status: He is alert and oriented to person, place, and time. Mental status is at baseline.  Psychiatric:        Mood and Affect: Mood normal.       Results for orders placed or performed in visit on 01/20/20  POCT glycosylated hemoglobin (Hb A1C)  Result Value Ref Range   Hemoglobin A1C 7.0 (A) 4.0 - 5.6 %   Est. average glucose Bld gHb Est-mCnc 154     Assessment & Plan     1. Type 2 diabetes mellitus with hyperglycemia, without long-term current use of insulin ([facility]) A1c is up to 7.0. Will continue Januvia 100mg , metformin 1000mg  BID, glipizide XR 10mg , and farxiga 10mg . Return in 3 months.   2. Essential (hemorrhagic) thrombocythemia ([place]) Stable. Will check labs in 3 months.   3. Coronary artery disease involving coronary bypass graft of native heart with angina pectoris  ([place]) Stable.  4. Type 2 diabetes mellitus withcomplication, circulatory problems, without long-term current use of insulin (H[facility]) Stable. Diagnosis pulled for medication refill. Continue current medical treatment plan. See plan for #1. - dapagliflozin propanediol (FARXIGA) 10 MG TABS tablet; Take 10 mg by mouth daily.  Dispense: 90 tablet; Refill: 1  5. Depression with anxiety Stable. Diagnosis pulled for medication refill. Continue current medical treatment plan. - venlafaxine XR (EFFEXOR-XR) 150 MG 24 hr capsule; TAKE 1 CAPSULE(150 MG) BY MOUTH DAILY WITH BREAKFAST  Dispense: 90 capsule; Refill: 1   Return in about 3 months (around 04/21/2020) for CPE.      Reynolds Bowl, PA-C, have reviewed all documentation for this visit. The documentation on 01/20/20 for the exam, diagnosis, procedures, and orders are all accurate and complete.   Rubye Beach  [facility] [phone] (phone) [phone] (fax)  [place]

## 2020-01-20 ENCOUNTER — Encounter: Payer: Self-pay | Admitting: Physician Assistant

## 2020-01-20 ENCOUNTER — Other Ambulatory Visit: Payer: Self-pay

## 2020-01-20 ENCOUNTER — Ambulatory Visit: Payer: BC Managed Care – PPO | Admitting: Physician Assistant

## 2020-01-20 VITALS — BP 116/75 | HR 69 | Temp 97.0°F | Resp 16 | Wt 171.2 lb

## 2020-01-20 DIAGNOSIS — D473 Essential (hemorrhagic) thrombocythemia: Secondary | ICD-10-CM | POA: Insufficient documentation

## 2020-01-20 DIAGNOSIS — I25709 Atherosclerosis of coronary artery bypass graft(s), unspecified, with unspecified angina pectoris: Secondary | ICD-10-CM | POA: Diagnosis not present

## 2020-01-20 DIAGNOSIS — E1165 Type 2 diabetes mellitus with hyperglycemia: Secondary | ICD-10-CM | POA: Diagnosis not present

## 2020-01-20 DIAGNOSIS — F418 Other specified anxiety disorders: Secondary | ICD-10-CM

## 2020-01-20 DIAGNOSIS — E1159 Type 2 diabetes mellitus with other circulatory complications: Secondary | ICD-10-CM

## 2020-01-20 LAB — POCT GLYCOSYLATED HEMOGLOBIN (HGB A1C)
Est. average glucose Bld gHb Est-mCnc: 154
Hemoglobin A1C: 7 % — AB (ref 4.0–5.6)

## 2020-01-20 MED ORDER — VENLAFAXINE HCL ER 150 MG PO CP24: ORAL_CAPSULE | ORAL | 1 refills | Status: DC

## 2020-01-20 MED ORDER — FARXIGA 10 MG PO TABS: 10.0000 mg | ORAL_TABLET | Freq: Every day | ORAL | 1 refills | Status: DC

## 2020-01-20 NOTE — Patient Instructions (Signed)
Ferrous gluconate   Diabetes Mellitus and Nutrition, Adult When you have diabetes (diabetes mellitus), it is very important to have healthy eating habits because your blood sugar (glucose) levels are greatly affected by what you eat and drink. Eating healthy foods in the appropriate amounts, at about the same times every day, can help you:  Control your blood glucose.  Lower your risk of heart disease.  Improve your blood pressure.  Reach or maintain a healthy weight. Every person with diabetes is different, and each person has different needs for a meal plan. Your health care provider may recommend that you work with a diet and nutrition specialist (dietitian) to make a meal plan that is best for you. Your meal plan may vary depending on factors such as:  The calories you need.  The medicines you take.  Your weight.  Your blood glucose, blood pressure, and cholesterol levels.  Your activity level.  Other health conditions you have, such as heart or kidney disease. How do carbohydrates affect me? Carbohydrates, also called carbs, affect your blood glucose level more than any other type of food. Eating carbs naturally raises the amount of glucose in your blood. Carb counting is a method for keeping track of how many carbs you eat. Counting carbs is important to keep your blood glucose at a healthy level, especially if you use insulin or take certain oral diabetes medicines. It is important to know how many carbs you can safely have in each meal. This is different for every person. Your dietitian can help you calculate how many carbs you should have at each meal and for each snack. Foods that contain carbs include:  Bread, cereal, rice, pasta, and crackers.  Potatoes and corn.  Peas, beans, and lentils.  Milk and yogurt.  Fruit and juice.  Desserts, such as cakes, cookies, ice cream, and candy. How does alcohol affect me? Alcohol can cause a sudden decrease in blood glucose  (hypoglycemia), especially if you use insulin or take certain oral diabetes medicines. Hypoglycemia can be a life-threatening condition. Symptoms of hypoglycemia (sleepiness, dizziness, and confusion) are similar to symptoms of having too much alcohol. If your health care provider says that alcohol is safe for you, follow these guidelines:  Limit alcohol intake to no more than 1 drink per day for nonpregnant women and 2 drinks per day for men. One drink equals 12 oz of beer, 5 oz of wine, or 1 oz of hard liquor.  Do not drink on an empty stomach.  Keep yourself hydrated with water, diet soda, or unsweetened iced tea.  Keep in mind that regular soda, juice, and other mixers may contain a lot of sugar and must be counted as carbs. What are tips for following this plan?  Reading food labels  Start by checking the serving size on the "Nutrition Facts" label of packaged foods and drinks. The amount of calories, carbs, fats, and other nutrients listed on the label is based on one serving of the item. Many items contain more than one serving per package.  Check the total grams (g) of carbs in one serving. You can calculate the number of servings of carbs in one serving by dividing the total carbs by 15. For example, if a food has 30 g of total carbs, it would be equal to 2 servings of carbs.  Check the number of grams (g) of saturated and trans fats in one serving. Choose foods that have low or no amount of these fats.  Check the number of milligrams (mg) of salt (sodium) in one serving. Most people should limit total sodium intake to less than 2,300 mg per day.  Always check the nutrition information of foods labeled as "low-fat" or "nonfat". These foods may be higher in added sugar or refined carbs and should be avoided.  Talk to your dietitian to identify your daily goals for nutrients listed on the label. Shopping  Avoid buying canned, premade, or processed foods. These foods tend to be high  in fat, sodium, and added sugar.  Shop around the outside edge of the grocery store. This includes fresh fruits and vegetables, bulk grains, fresh meats, and fresh dairy. Cooking  Use low-heat cooking methods, such as baking, instead of high-heat cooking methods like deep frying.  Cook using healthy oils, such as olive, canola, or sunflower oil.  Avoid cooking with butter, cream, or high-fat meats. Meal planning  Eat meals and snacks regularly, preferably at the same times every day. Avoid going long periods of time without eating.  Eat foods high in fiber, such as fresh fruits, vegetables, beans, and whole grains. Talk to your dietitian about how many servings of carbs you can eat at each meal.  Eat 4-6 ounces (oz) of lean protein each day, such as lean meat, chicken, fish, eggs, or tofu. One oz of lean protein is equal to: ? 1 oz of meat, chicken, or fish. ? 1 egg. ?  cup of tofu.  Eat some foods each day that contain healthy fats, such as avocado, nuts, seeds, and fish. Lifestyle  Check your blood glucose regularly.  Exercise regularly as told by your health care provider. This may include: ? 150 minutes of moderate-intensity or vigorous-intensity exercise each week. This could be brisk walking, biking, or water aerobics. ? Stretching and doing strength exercises, such as yoga or weightlifting, at least 2 times a week.  Take medicines as told by your health care provider.  Do not use any products that contain nicotine or tobacco, such as cigarettes and e-cigarettes. If you need help quitting, ask your health care provider.  Work with a Social worker or diabetes educator to identify strategies to manage stress and any emotional and social challenges. Questions to ask a health care provider  Do I need to meet with a diabetes educator?  Do I need to meet with a dietitian?  What number can I call if I have questions?  When are the best times to check my blood glucose? Where to  find more information:  American Diabetes Association: diabetes.org  Academy of Nutrition and Dietetics: www.eatright.[employer] of Diabetes and Digestive and Kidney Diseases (NIH): [employer] Summary  A healthy meal plan will help you control your blood glucose and maintain a healthy lifestyle.  Working with a diet and nutrition specialist (dietitian) can help you make a meal plan that is best for you.  Keep in mind that carbohydrates (carbs) and alcohol have immediate effects on your blood glucose levels. It is important to count carbs and to use alcohol carefully. This information is not intended to replace advice given to you by your health care provider. Make sure you discuss any questions you have with your health care provider. Document Revised: 08/14/2017 Document Reviewed: 10/06/2016 Elsevier Patient Education  2020 [employer].

## 2020-01-26 ENCOUNTER — Encounter: Payer: Self-pay | Admitting: Physician Assistant

## 2020-01-26 DIAGNOSIS — F411 Generalized anxiety disorder: Secondary | ICD-10-CM

## 2020-01-26 DIAGNOSIS — F321 Major depressive disorder, single episode, moderate: Secondary | ICD-10-CM

## 2020-01-26 MED ORDER — VENLAFAXINE HCL ER 75 MG PO CP24: 75.0000 mg | ORAL_CAPSULE | Freq: Every day | ORAL | 1 refills | Status: DC

## 2020-01-26 NOTE — Addendum Note (Signed)
Addended by: Mar Daring on: 01/26/2020 02:12 PM   Modules accepted: Orders

## 2020-02-06 NOTE — Progress Notes (Signed)
02/07/20 4:44 PM   Joseph Terry November 18, 1960 [id number]  Referring provider: Mar Daring, PA-C [place],  [place] [address] No chief complaint on file.   HPI: Joseph Terry is a 59 y.o. M who presents today for the evaluation and management of Peyronie's disease and ED.  He reports of pain/discomfort w/ penis curvature to the left onset 6 months ago. He estimates his curvature to be approximately 90 degrees. He denies trauma or injury to penis.   With Viagara 20 mg he reports of 20% fullness in erections which he takes 1 hour beforehand. He does not wait 3 hrs prior to using Generic Cialis.   Overall, his satisfaction with the above to medications is moderate.  He is unsure whether the curvature of his penis or lack of full erection is causing the most dissatisfaction.  He is currently not sexually active with a partner because of the above issues.  Multiple risk factors for ED as listed below.  Not currently taking any nitrates.  Most recent PSA 0.4 as of 04/05/19.   PMH: Past Medical History:  Diagnosis Date  . Anxiety state, unspecified   . Barrett's esophagus   . CAD (coronary artery disease), severe multivessel CAD surgical consult for CABG    a. 12/2011 Cath: 3 vessel CAD with normal EF;  b. 12/2011 CABG x 5: LIMA to LAD, SVG to distal RCA with sequential SVG to PDA, SVG to OM, and SVG to D2;  c. 03/2014 ETT: Ex time 6:21, 56mm horizontal ST dep in V3-V6, HTN response.  . Depression   . Diaphragmatic hernia without mention of obstruction or gangrene   . Diverticulosis   . GERD (gastroesophageal reflux disease)   . Hyperlipidemia   . Hypertension   . Impotence of organic origin   . Internal hemorrhoids without mention of complication   . Iron deficiency anemia due to chronic blood loss 04/15/2017  . Kidney stone   . Motion sickness    cars, boats  . OSA on CPAP    a. Sleep study 07/2013 +sever OSA; CPAP at 10cm recommended.  . Personal  history of colonic polyps   . Type II diabetes mellitus ([place])   . Wears contact lenses     Surgical History: Past Surgical History:  Procedure Laterality Date  . CARDIAC CATHETERIZATION  4 /19/ 2013   [facility]  11/03/13  . COLONOSCOPY  11/01/13   multiple polyps; diverticulosis. [facility]. Repeat 5 years.  . COLONOSCOPY W/ POLYPECTOMY  09/16/2007   colon polyps.  Repeat 5 years. [facility].  . COLONOSCOPY WITH PROPOFOL N/A 06/04/2017   Procedure: COLONOSCOPY WITH PROPOFOL;  Surgeon: [name], MD;  Location: [facility];  Service: Gastroenterology;  Laterality: N/A;  needs to remain first patient stated he also suppose to get EGD also  . CORONARY ARTERY BYPASS GRAFT  01/05/2012   Procedure: CORONARY ARTERY BYPASS GRAFTING (CABG);  Surgeon: Rexene Alberts, MD;  Location: [facility];  Service: Open Heart Surgery;  Laterality: N/A;  coronary artery bypass graft times five using left internal mammary artery and right leg greater saphenous vein harvested endoscopically   . ESOPHAGOGASTRODUODENOSCOPY N/A 06/04/2017   PT NEEDS REPEAT IN 06/2020/BARRETT'S ESOPHAGUS - Surgeon: [name], MD  . GIVENS CAPSULE STUDY N/A 06/22/2017   Procedure: GIVENS CAPSULE STUDY;  Surgeon: [name], MD;  Location: [facility] ENDOSCOPY;  Service: Endoscopy;  Laterality: N/A;  . LEFT HEART CATHETERIZATION WITH CORONARY ANGIOGRAM  N/A 01/02/2012   Procedure: LEFT HEART CATHETERIZATION WITH CORONARY ANGIOGRAM;  Surgeon: Troy Sine, MD;  Location: [facility] CATH LAB;  Service: Cardiovascular;  Laterality: N/A;  . POLYPECTOMY N/A 06/04/2017   Procedure: POLYPECTOMY;  Surgeon: [name], MD;  Location: [facility];  Service: Gastroenterology;  Laterality: N/A;  . POLYSOMNOGRAPHY  07/2013   +severe OSA; CPAP at 10cm pressure recommended.  . rotator cuff curgery  2011   lt rotator cuff  . Shoulder surgery  1997   bilateral  . WISDOM TOOTH EXTRACTION      Home Medications:  Allergies as of  02/07/2020      Reactions   Penicillins Itching   tingling   Tramadol Rash      Medication List       Accurate as of Feb 06, 2020  4:44 PM. If you have any questions, ask your nurse or doctor.        atorvastatin 40 MG tablet Commonly known as: LIPITOR TAKE 1 TABLET(40 MG) BY MOUTH DAILY   clonazePAM 1 MG tablet Commonly known as: KLONOPIN TAKE 1 TABLET BY MOUTH TWICE DAILY AS NEEDED   Farxiga 10 MG Tabs tablet Generic drug: dapagliflozin propanediol Take 10 mg by mouth daily.   ferrous sulfate 325 (65 FE) MG EC tablet Take 1 tablet (325 mg total) by mouth 3 (three) times daily with meals.   glipiZIDE 10 MG 24 hr tablet Commonly known as: GLUCOTROL XL TAKE 1 TABLET BY MOUTH EVERY DAY WITH BREAKFAST   metFORMIN 1000 MG tablet Commonly known as: GLUCOPHAGE TAKE 1 TABLET(1000 MG) BY MOUTH TWICE DAILY WITH A MEAL   metoprolol tartrate 25 MG tablet Commonly known as: LOPRESSOR TAKE 1 TABLET(25 MG) BY MOUTH TWICE DAILY   omeprazole 40 MG capsule Commonly known as: PRILOSEC TAKE 1 CAPSULE(40 MG) BY MOUTH DAILY   sitaGLIPtin 100 MG tablet Commonly known as: Januvia TAKE 1 TABLET BY MOUTH DAILY   venlafaxine XR 150 MG 24 hr capsule Commonly known as: EFFEXOR-XR TAKE 1 CAPSULE(150 MG) BY MOUTH DAILY WITH BREAKFAST   venlafaxine XR 75 MG 24 hr capsule Commonly known as: EFFEXOR-XR Take 1 capsule (75 mg total) by mouth daily with breakfast. Take with venlafaxine 150mg  to = 225mg        Allergies:  Allergies  Allergen Reactions  . Penicillins Itching    tingling  . Tramadol Rash    Family History: Family History  Problem Relation Age of Onset  . Heart attack Mother        defribillator in place  . Hypertension Mother   . Hyperlipidemia Mother   . Heart disease Mother        CHF with defibrillator;  CAD.  . Diabetes Mother   . Cancer Mother 45       Lung cancer  . Hypertension Father   . Hyperlipidemia Father   . Cancer Father 85        prostate  cancer s/p  implants  . COPD Father   . Hypertension Sister   . Heart disease Maternal Grandmother   . Heart disease Maternal Grandfather   . Cancer Paternal Grandmother   . Cancer Paternal Grandfather   . Hypertension Sister     Social History:  reports that he quit smoking about 8 years ago. His smoking use included cigarettes. He has a 52.50 pack-year smoking history. He has never used smokeless tobacco. He reports current alcohol use of about 1.0 standard drinks of alcohol per week. He reports  that he does not use drugs.   Physical Exam: There were no vitals taken for this visit.  Constitutional:  Alert and oriented, No acute distress. HEENT: [place] AT, moist mucus membranes.  Trachea midline, no masses. Cardiovascular: No clubbing, cyanosis, or edema. Respiratory: Normal respiratory effort, no increased work of breathing. GU: Circumcised penis. Palpable plaque more dense on left side than right, 1 cm on dorsal aspect of shaft. Skin: No rashes, bruises or suspicious lesions. Neurologic: Grossly intact, no focal deficits, moving all 4 extremities. Psychiatric: Normal mood and affect.  Laboratory Data:  Lab Results  Component Value Date   HGBA1C 7.0 (A) 01/20/2020   Assessment & Plan:    1. ED We discussed the pathophysiology of erectile dysfunction today along with possible congestive any factors. Discussed possible treatment options including PDE 5 inhibitors, vacuum erectile device, intracavernosal injection, MUSE, and placement of the inflatable or malleable penal prosthesis for refractory cases.  In terms of PDE 5 inhibitors, we discussed contraindications for this medication as well as common side effects. Patient was counseled on optimal use. All of his questions were answered in detail.  He is interested in Trimix injections  F/u w/ Larene Beach PA-C for Trimix injection teaching and for measurement of penis curvature   2. Peyronie's Disease  Based on patient's history of  physical exam, findings are consistent with Peyronie's disease.  Pathophysiology was discussed at length including the 2 phases of the diease, acute and chronic.  Treatment options and goals of treatment were discussed today in detail. Options including observation, penile plaque and graft, penile plication, placement of penile prosthesis, and injection of collagenase were all reviewed.  An benefits of each were discussed at length.  He seems to be most interested in collagenase injections with the medication Xiaflex.  We discussed that this medication is administered in cycles which include 2 injections of the medication into the penile plaque followed by modeling procedure with 6 weeks of home exercises. Goals of treatment were reviewed which include improvement of penile curvature ideally to facilitate sexual function/penetration but likely not complete resolution of the curvature. Risks including failure of the medication, penile hematoma/edema, pain, and penile fracture were all discussed. All of his questions were answered.  He'll call us to let us know if it like to schedule cycle this medication and we will arrange for insurance investigation. He will need an induction of an erection with measurement as part of this evaluation.   [facility] [address] [place], [place] [address] [phone]  I, Lucas Mallow, am acting as a scribe for Dr. Hollice Espy,  I have reviewed the above documentation for accuracy and completeness, and I agree with the above.   Hollice Espy, MD

## 2020-02-07 ENCOUNTER — Other Ambulatory Visit: Payer: Self-pay

## 2020-02-07 ENCOUNTER — Ambulatory Visit: Payer: BC Managed Care – PPO | Admitting: Urology

## 2020-02-07 VITALS — BP 145/80 | HR 71 | Ht 70.0 in | Wt 172.0 lb

## 2020-02-07 DIAGNOSIS — R3129 Other microscopic hematuria: Secondary | ICD-10-CM

## 2020-02-07 DIAGNOSIS — N5203 Combined arterial insufficiency and corporo-venous occlusive erectile dysfunction: Secondary | ICD-10-CM

## 2020-02-07 DIAGNOSIS — N486 Induration penis plastica: Secondary | ICD-10-CM

## 2020-02-07 MED ORDER — NONFORMULARY OR COMPOUNDED ITEM: 0 refills | Status: DC

## 2020-02-08 DIAGNOSIS — R519 Headache, unspecified: Secondary | ICD-10-CM | POA: Diagnosis not present

## 2020-02-08 DIAGNOSIS — M5412 Radiculopathy, cervical region: Secondary | ICD-10-CM | POA: Diagnosis not present

## 2020-02-08 DIAGNOSIS — M9901 Segmental and somatic dysfunction of cervical region: Secondary | ICD-10-CM | POA: Diagnosis not present

## 2020-02-08 DIAGNOSIS — M9903 Segmental and somatic dysfunction of lumbar region: Secondary | ICD-10-CM | POA: Diagnosis not present

## 2020-02-08 LAB — URINALYSIS, COMPLETE
Bilirubin, UA: NEGATIVE
Leukocytes,UA: NEGATIVE
Nitrite, UA: NEGATIVE
Protein,UA: NEGATIVE
RBC, UA: NEGATIVE
Specific Gravity, UA: 1.02 (ref 1.005–1.030)
Urobilinogen, Ur: 0.2 mg/dL (ref 0.2–1.0)
pH, UA: 5.5 (ref 5.0–7.5)

## 2020-02-08 LAB — MICROSCOPIC EXAMINATION

## 2020-02-09 DIAGNOSIS — M9901 Segmental and somatic dysfunction of cervical region: Secondary | ICD-10-CM | POA: Diagnosis not present

## 2020-02-09 DIAGNOSIS — M9903 Segmental and somatic dysfunction of lumbar region: Secondary | ICD-10-CM | POA: Diagnosis not present

## 2020-02-09 DIAGNOSIS — R519 Headache, unspecified: Secondary | ICD-10-CM | POA: Diagnosis not present

## 2020-02-09 DIAGNOSIS — M5412 Radiculopathy, cervical region: Secondary | ICD-10-CM | POA: Diagnosis not present

## 2020-02-10 DIAGNOSIS — M9901 Segmental and somatic dysfunction of cervical region: Secondary | ICD-10-CM | POA: Diagnosis not present

## 2020-02-10 DIAGNOSIS — M5412 Radiculopathy, cervical region: Secondary | ICD-10-CM | POA: Diagnosis not present

## 2020-02-10 DIAGNOSIS — R519 Headache, unspecified: Secondary | ICD-10-CM | POA: Diagnosis not present

## 2020-02-10 DIAGNOSIS — M9903 Segmental and somatic dysfunction of lumbar region: Secondary | ICD-10-CM | POA: Diagnosis not present

## 2020-02-15 DIAGNOSIS — M9901 Segmental and somatic dysfunction of cervical region: Secondary | ICD-10-CM | POA: Diagnosis not present

## 2020-02-15 DIAGNOSIS — M5412 Radiculopathy, cervical region: Secondary | ICD-10-CM | POA: Diagnosis not present

## 2020-02-15 DIAGNOSIS — M9903 Segmental and somatic dysfunction of lumbar region: Secondary | ICD-10-CM | POA: Diagnosis not present

## 2020-02-15 DIAGNOSIS — R519 Headache, unspecified: Secondary | ICD-10-CM | POA: Diagnosis not present

## 2020-02-16 DIAGNOSIS — M9901 Segmental and somatic dysfunction of cervical region: Secondary | ICD-10-CM | POA: Diagnosis not present

## 2020-02-16 DIAGNOSIS — M9903 Segmental and somatic dysfunction of lumbar region: Secondary | ICD-10-CM | POA: Diagnosis not present

## 2020-02-16 DIAGNOSIS — R519 Headache, unspecified: Secondary | ICD-10-CM | POA: Diagnosis not present

## 2020-02-16 DIAGNOSIS — M5412 Radiculopathy, cervical region: Secondary | ICD-10-CM | POA: Diagnosis not present

## 2020-02-28 ENCOUNTER — Other Ambulatory Visit: Payer: Self-pay

## 2020-02-28 ENCOUNTER — Encounter: Payer: Self-pay | Admitting: Urology

## 2020-02-28 ENCOUNTER — Ambulatory Visit: Payer: BC Managed Care – PPO | Admitting: Urology

## 2020-02-28 VITALS — BP 132/80 | HR 73 | Ht 70.0 in | Wt 170.0 lb

## 2020-02-28 DIAGNOSIS — N529 Male erectile dysfunction, unspecified: Secondary | ICD-10-CM

## 2020-02-28 DIAGNOSIS — N486 Induration penis plastica: Secondary | ICD-10-CM

## 2020-02-28 NOTE — Progress Notes (Signed)
Joseph Terry presents today for a Trimix titration.  He is still having moderately spontaneous erections. He has had moderate response to PDE5i's.  He denies any history of sickle cell anemia or trait, a history of multiple myeloma or a history of leukemia.  He has not taken trazodone or a PDE5i's today.   Patient's left corpus cavernosum is identified.  An area near the base of the penis is cleansed with rubbing alcohol.  Careful to avoid the dorsal vein, 3 mcg of Trimix (papaverine 30 mg, phentolamine 1 mg and prostaglandin E1 10 mcg, Lot # [id number] exp # 04/07/2020) is injected at a 90 degree angle into the left corpus cavernosum near the base of the penis.  Patient experienced a semi firm erection in 15 minutes. It was noted that the patient had a greater than 70 degree curvature to the right due to the peyronie's plaque.   Advised patient of the condition of priapism, painful erection lasting for more than four hours, and to contact the office immediately or seek treatment in the ED   I, Kyla Peace, am acting as a scribe for  , PA-C.   I have reviewed the above documentation for accuracy and completeness, and I agree with the above.     , PA-C 

## 2020-03-01 ENCOUNTER — Ambulatory Visit: Payer: Self-pay | Admitting: Urology

## 2020-03-02 ENCOUNTER — Telehealth: Payer: Self-pay

## 2020-03-02 NOTE — Telephone Encounter (Signed)
Benefit investigation came back stating medication is covered with restrictions. Patient has met 2,976.03 of a 7500 max out of pocket. Medical benefit is covered, pharmacy is not covered. Patient states he cannot afford at this time.

## 2020-03-12 NOTE — Telephone Encounter (Signed)
Spoke with benefits

## 2020-03-18 ENCOUNTER — Other Ambulatory Visit: Payer: Self-pay | Admitting: Physician Assistant

## 2020-03-18 DIAGNOSIS — F411 Generalized anxiety disorder: Secondary | ICD-10-CM

## 2020-03-18 DIAGNOSIS — F321 Major depressive disorder, single episode, moderate: Secondary | ICD-10-CM

## 2020-03-18 NOTE — Telephone Encounter (Signed)
Requested Prescriptions  Pending Prescriptions Disp Refills  . venlafaxine XR (EFFEXOR-XR) 75 MG 24 hr capsule [Pharmacy Med Name: VENLAFAXINE ER 75MG  CAPSULES] 30 capsule 1    Sig: TAKE 1 CAPSULE(75 MG) BY MOUTH DAILY WITH BREAKFAST. TAKE WITH 150 MG CAPSULE TO TOTAL 225 MG.     Psychiatry: Antidepressants - SNRI - desvenlafaxine & venlafaxine Failed - 03/18/2020 11:09 AM      Failed - Triglycerides in normal range and within 360 days    Triglycerides  Date Value Ref Range Status  04/05/2019 249 (H) 0 - 149 mg/dL Final         Passed - LDL in normal range and within 360 days    LDL Calculated  Date Value Ref Range Status  04/05/2019 22 0 - 99 mg/dL Final         Passed - Total Cholesterol in normal range and within 360 days    Cholesterol, Total  Date Value Ref Range Status  04/05/2019 109 100 - 199 mg/dL Final         Passed - Completed PHQ-2 or PHQ-9 in the last 360 days.      Passed - Last BP in normal range    BP Readings from Last 1 Encounters:  02/28/20 132/80         Passed - Valid encounter within last 6 months    Recent Outpatient Visits          1 month ago Type 2 diabetes mellitus with hyperglycemia, without long-term current use of insulin [facility])   [facility] [place] M, [place]   11 months ago Annual physical exam   [place], [place], [place]   1 year ago Type 2 diabetes mellitus without complication, without long-term current use of insulin [facility])   [facility] [place], [place], [place]   1 year ago Nonintractable headache, unspecified chronicity pattern, unspecified headache type   [facility] [place], [place], PA-C   1 year ago Type 2 diabetes mellitus without complication, without long-term current use of insulin [facility])   [place], Clearnce Sorrel, [place]      Future Appointments            In 1 month [place], Clearnce [place], PA-C [employer],  [place]

## 2020-03-19 ENCOUNTER — Other Ambulatory Visit: Payer: Self-pay | Admitting: Physician Assistant

## 2020-03-19 DIAGNOSIS — K2270 Barrett's esophagus without dysplasia: Secondary | ICD-10-CM

## 2020-03-27 DIAGNOSIS — R519 Headache, unspecified: Secondary | ICD-10-CM | POA: Diagnosis not present

## 2020-03-27 DIAGNOSIS — M9901 Segmental and somatic dysfunction of cervical region: Secondary | ICD-10-CM | POA: Diagnosis not present

## 2020-03-27 DIAGNOSIS — M9903 Segmental and somatic dysfunction of lumbar region: Secondary | ICD-10-CM | POA: Diagnosis not present

## 2020-03-27 DIAGNOSIS — M5412 Radiculopathy, cervical region: Secondary | ICD-10-CM | POA: Diagnosis not present

## 2020-04-01 ENCOUNTER — Other Ambulatory Visit: Payer: Self-pay | Admitting: Physician Assistant

## 2020-04-01 DIAGNOSIS — E119 Type 2 diabetes mellitus without complications: Secondary | ICD-10-CM

## 2020-04-01 NOTE — Telephone Encounter (Signed)
Requested Prescriptions  Pending Prescriptions Disp Refills  . sitaGLIPtin (JANUVIA) 100 MG tablet [Pharmacy Med Name: JANUVIA 100MG  TABLETS] 90 tablet 0    Sig: TAKE 1 TABLET BY MOUTH DAILY     Endocrinology:  Diabetes - DPP-4 Inhibitors Failed - 04/01/2020  3:38 AM      Failed - Cr in normal range and within 360 days    Creat  Date Value Ref Range Status  06/18/2015 0.76 0.70 - 1.33 mg/dL Final   Creatinine, Ser  Date Value Ref Range Status  04/05/2019 0.74 (L) 0.76 - 1.27 mg/dL Final   Creatinine, Urine  Date Value Ref Range Status  05/15/2014 230.5 mg/dL Final         Passed - HBA1C is between 0 and 7.9 and within 180 days    Hemoglobin A1C  Date Value Ref Range Status  01/20/2020 7.0 (A) 4.0 - 5.6 % Final   Hgb A1c MFr Bld  Date Value Ref Range Status  04/05/2019 6.6 (H) 4.8 - 5.6 % Final    Comment:             Prediabetes: 5.7 - 6.4          Diabetes: >6.4          Glycemic control for adults with diabetes: <7.0          Passed - Valid encounter within last 6 months    Recent Outpatient Visits          2 months ago Type 2 diabetes mellitus with hyperglycemia, without long-term current use of insulin [facility])   [facility] [place], [place], [place]   12 months ago Annual physical exam   [employer], [place], [place]   1 year ago Type 2 diabetes mellitus without complication, without long-term current use of insulin [facility])   [place], [place], [place]   1 year ago Nonintractable headache, unspecified chronicity pattern, unspecified headache type   [facility] [place], [place], PA-C   1 year ago Type 2 diabetes mellitus without complication, without long-term current use of insulin [facility])   [place], [place], [place]      Future Appointments            In 2 weeks Hollice Espy, MD [place]   In 2 weeks Hollice Espy, MD  [facility]   In 2 weeks Hollice Espy, MD [place]   In 2 weeks Hollice Espy, MD [place]   In 3 weeks Mar Daring, PA-C [facility], [facility]           Creatinine 0.74 on 04/05/2019.  Patient has an appt with provider in 3 weeks.

## 2020-04-12 ENCOUNTER — Other Ambulatory Visit: Payer: Self-pay | Admitting: Physician Assistant

## 2020-04-12 DIAGNOSIS — E7800 Pure hypercholesterolemia, unspecified: Secondary | ICD-10-CM

## 2020-04-16 NOTE — Telephone Encounter (Signed)
[employer] Approved prior auth for Xiaflex, see letter in chart Auth # [id number] Approved from 03-14-20-05/13/20

## 2020-04-16 NOTE — Progress Notes (Signed)
   04/17/2020 1:18 PM   Joseph Terry 1961/03/31 [id number]  Referring provider: Mar Daring, PA-C [place],  [place] [address] No chief complaint on file.   HPI: Joseph Terry is a 59 y.o. male with ED and peyronie's disease who present for follow up and Xiaflex Injection.   He reports of pain/discomfort w/ penis curvature to the left onset 6 months ago. He estimates his curvature to be approximately 90 degrees. He denies trauma or injury to penis.  Patient was seen by Zara Council on 02/28/2020 for trimix titration.  Earlier this AM, he underwent erection induction and marking by Larene Beach, please see note.   Procedure: Xiaflex injection   Previously marked penile plaque site Remained visible with marking pen.  Elected to treat left midshaft plaque as this seems contributing most to overall curvature at this time. The plaque was easily palpable at the left lateral mid shift. Xiaflex injection (0.58 mg) was then injected directly into the plaque at the point of maximal curvature at an angle after to being warm to room temperature using a small TB needle. He tolerated the procedure very well.     Assessment & Plan:     1. Peyronie's disease Based on patient's history of physical exam, findings are consistent with Peyronie's disease.  Patient received Xiaflex injection today.  Reviewed precautions, post procedure expectations (bruising/ soreness)   Return tomorrow for next injection   [place] [address] [place], [place] [address] [phone]  I, Selena Batten, am acting as a scribe for Dr. Hollice Espy.  I have reviewed the above documentation for accuracy and completeness, and I agree with the above.   Hollice Espy, MD

## 2020-04-16 NOTE — Telephone Encounter (Signed)
Patient notified and scheduled for injections

## 2020-04-17 ENCOUNTER — Ambulatory Visit (INDEPENDENT_AMBULATORY_CARE_PROVIDER_SITE_OTHER): Payer: BC Managed Care – PPO | Admitting: Urology

## 2020-04-17 ENCOUNTER — Other Ambulatory Visit: Payer: Self-pay

## 2020-04-17 ENCOUNTER — Encounter: Payer: Self-pay | Admitting: Urology

## 2020-04-17 VITALS — BP 131/78 | HR 68 | Ht 70.0 in | Wt 172.0 lb

## 2020-04-17 VITALS — BP 143/87 | HR 100

## 2020-04-17 DIAGNOSIS — N486 Induration penis plastica: Secondary | ICD-10-CM | POA: Diagnosis not present

## 2020-04-17 MED ORDER — COLLAGENASE CLOSTRID HISTOLYT 0.9 MG IJ SOLR
0.9000 mg | Freq: Once | INTRAMUSCULAR | Status: AC
  Administered 2020-04-17: 0.9 mg via INTRAMUSCULAR

## 2020-04-17 NOTE — Progress Notes (Signed)
04/18/2020  Procedure: Xiaflex injection   Previously marked penile plaque site Remained visible with marking pen. The most distal left lateral mid shaft plaque was injected.  Notably, there is some edema and bruising noted from yesterday's injection.Shyrl Numbers injection (0.58 mg) was then injected directly into the plaque at the point of maximal curvature at an angle after to being warm to room temperature using a small TB needle. He tolerated the procedure very well.   Fransico Him, am acting as a scribe for Dr. Hollice Espy.  I have reviewed the above documentation for accuracy and completeness, and I agree with the above.   Hollice Espy, MD

## 2020-04-17 NOTE — Progress Notes (Signed)
Joseph Terry presents today for a erection induction and plaque marking for Xiaflex injection this afternoon.   A 2.5 cm x 1.5 cm plaque is palpated in the dorsal area of the penile shaft towards the base.  A 1 cm x 2 cm plaque is palpated on the dorsal left side near the glans.  They are outlined with a marker.      Patient's left corpus cavernosum is identified.  An area near the base of the penis is cleansed with rubbing alcohol.  Careful to avoid the dorsal vein, 5 mcg of Edex 10 mcg Lot # [id number] exp 06/2020 is injected at a 90 degree angle into the left corpus cavernosum near the base of the penis.  Patient experienced a semi erection in 15 minutes.   A 90 degree curvature to the left is noted.    Advised patient of the condition of priapism, painful erection lasting for more than four hours, and to contact the office immediately or seek treatment in the ED   He will return this afternoon to begin Xiaflex injections with Dr. Erlene Quan.

## 2020-04-18 ENCOUNTER — Other Ambulatory Visit: Payer: Self-pay

## 2020-04-18 ENCOUNTER — Ambulatory Visit (INDEPENDENT_AMBULATORY_CARE_PROVIDER_SITE_OTHER): Payer: BC Managed Care – PPO | Admitting: Urology

## 2020-04-18 VITALS — BP 127/74 | HR 81

## 2020-04-18 DIAGNOSIS — N486 Induration penis plastica: Secondary | ICD-10-CM

## 2020-04-18 MED ORDER — COLLAGENASE CLOSTRID HISTOLYT 0.9 MG IJ SOLR
0.9000 mg | Freq: Once | INTRAMUSCULAR | Status: AC
  Administered 2020-04-18: 0.9 mg via INTRAMUSCULAR

## 2020-04-19 NOTE — Progress Notes (Signed)
04/20/20  Modeling procedure: Modeling performed today per the Xiaflex protocol.  Penile plaque palpated at dorsal base of penis. Phallus grasp just 1 cm above and below the plaque and using the plaque as a fulcrum, phallus bent in opposite direction of the curvature for proximally 30 seconds at a time.  This was repeated x 3.  Patient was instructed on home penile stretching exercises as well as penile bending exercises. A handout was given today describing how to perform these exercises for the next 6 weeks.  Avoidance of sexual activity in the interim was stressed again today, risk of penile fracture.  Notably on exam today he did have some residual ecchymosis involving the suprapubic area scrotum as well as along the shaft of the penis. Less edema today.  Hollice Espy, MD.  I spent 10 total minutes on the day of the encounter including pre-visit review of the medical record, face-to-face time with the patient, and post visit ordering of labs/imaging/tests.

## 2020-04-20 ENCOUNTER — Other Ambulatory Visit: Payer: Self-pay

## 2020-04-20 ENCOUNTER — Encounter: Payer: Self-pay | Admitting: Urology

## 2020-04-20 ENCOUNTER — Ambulatory Visit: Payer: BC Managed Care – PPO | Admitting: Urology

## 2020-04-20 VITALS — BP 151/80 | HR 79 | Ht 70.0 in | Wt 170.0 lb

## 2020-04-20 DIAGNOSIS — N486 Induration penis plastica: Secondary | ICD-10-CM

## 2020-04-20 NOTE — Progress Notes (Signed)
Complete physical exam   Patient: Joseph Terry   DOB: 1960/12/04   59 y.o. Male  MRN: [id number] Visit Date: 04/23/2020  Today's healthcare provider: Mar Daring, PA-C   Chief Complaint  Patient presents with  . Annual Exam   Subjective    Joseph Terry is a 59 y.o. male who presents today for a complete physical exam.  He reports consuming a general diet. The patient does not participate in regular exercise at present. He generally feels well. He reports sleeping well. He does not have additional problems to discuss today.  HPI  He would like to decrease Effexor back to just 150mg  daily from the 225mg  (was taking the 150mg  +75mg ). Patient's A1C today is 6.8. It went from 7.0 to 6.8.  Past Medical History:  Diagnosis Date  . Anxiety state, unspecified   . Barrett's esophagus   . CAD (coronary artery disease), severe multivessel CAD surgical consult for CABG    a. 12/2011 Cath: 3 vessel CAD with normal EF;  b. 12/2011 CABG x 5: LIMA to LAD, SVG to distal RCA with sequential SVG to PDA, SVG to OM, and SVG to D2;  c. 03/2014 ETT: Ex time 6:21, 70mm horizontal ST dep in V3-V6, HTN response.  . Depression   . Diaphragmatic hernia without mention of obstruction or gangrene   . Diverticulosis   . GERD (gastroesophageal reflux disease)   . Hyperlipidemia   . Hypertension   . Impotence of organic origin   . Internal hemorrhoids without mention of complication   . Iron deficiency anemia due to chronic blood loss 04/15/2017  . Kidney stone   . Motion sickness    cars, boats  . OSA on CPAP    a. Sleep study 07/2013 +sever OSA; CPAP at 10cm recommended.  . Personal history of colonic polyps   . Type II diabetes mellitus ([place])   . Wears contact lenses    Past Surgical History:  Procedure Laterality Date  . CARDIAC CATHETERIZATION  4 /19/ 2013   [place]  11/03/13  . COLONOSCOPY  11/01/13   multiple polyps; diverticulosis. Sankar. Repeat 5 years.   . COLONOSCOPY W/ POLYPECTOMY  09/16/2007   colon polyps.  Repeat 5 years. Iftikhar.  . COLONOSCOPY WITH PROPOFOL N/A 06/04/2017   Procedure: COLONOSCOPY WITH PROPOFOL;  Surgeon: [name], MD;  Location: [place];  Service: Gastroenterology;  Laterality: N/A;  needs to remain first patient stated he also suppose to get EGD also  . CORONARY ARTERY BYPASS GRAFT  01/05/2012   Procedure: CORONARY ARTERY BYPASS GRAFTING (CABG);  Surgeon: Rexene Alberts, MD;  Location: [place];  Service: Open Heart Surgery;  Laterality: N/A;  coronary artery bypass graft times five using left internal mammary artery and right leg greater saphenous vein harvested endoscopically   . ESOPHAGOGASTRODUODENOSCOPY N/A 06/04/2017   PT NEEDS REPEAT IN 06/2020/BARRETT'S ESOPHAGUS - Surgeon: [name], MD  . GIVENS CAPSULE STUDY N/A 06/22/2017   Procedure: GIVENS CAPSULE STUDY;  Surgeon: [name], MD;  Location[facility] ENDOSCOPY;  Service: Endoscopy;  Laterality: N/A;  . LEFT HEART CATHETERIZATION WITH CORONARY ANGIOGRAM N/A 01/02/2012   Procedure: LEFT HEART CATHETERIZATION WITH CORONARY ANGIOGRAM;  Surgeon: Troy Sine, MD;  [facility]: [facility] CATH LAB;  Service: Cardiovascular;  Laterality: N/A;  . POLYPECTOMY N/A 06/04/2017   Procedure: POLYPECTOMY;  Surgeon: [name], MD;  Location: [place];  Service: Gastroenterology;  Laterality: N/A;  . POLYSOMNOGRAPHY  07/2013   +severe OSA; CPAP at 10cm pressure recommended.  . rotator cuff curgery  2011   lt rotator cuff  . Shoulder surgery  1997   bilateral  . WISDOM TOOTH EXTRACTION     Social History   Socioeconomic History  . Marital status: Divorced    Spouse name: Not on file  . Number of children: 1  . Years of education: 20  . Highest education level: Not on file  Occupational History  . Occupation: Printmaker: [employer]    Comment: x 30 yrs  Tobacco Use  . Smoking status: Former Smoker    Packs/day: 1.50    Years:  35.00    Pack years: 52.50    Types: Cigarettes    Quit date: 11/14/2011    Years since quitting: 8.4  . Smokeless tobacco: Never Used  . Tobacco comment: quit 2 weeks prior to CABG 12/2011  Vaping Use  . Vaping Use: Never used  Substance and Sexual Activity  . Alcohol use: Yes    Alcohol/week: 1.0 standard drink    Types: 1 Cans of beer per week    Comment: Drinks once per month; beer.  . Drug use: No  . Sexual activity: Yes    Partners: Female    Birth control/protection: Condom  Other Topics Concern  . Not on file  Social History Narrative   Marital status: divorced since 1996; dating casually in 2016.        Lives: Lives alone.          Children:  One son and a granddaughter 29 years old live in [place].       Tobacco: quit with CABG      Alcohol: weekends; 6-8 beers on average.  Beers with football; ten on football weekends.      Exercise: Light, yard work.        Employment:  Works 45-50 hrs. Some weeks.  [employer].  Desk work since 05/2013. Since 1979.      Guns:  Guns in the home not stored in locked cabinet. Smoke alarm in home, Always wears seatbelts.       Sexual History: active;Last HIV testing 2015; has has 25+ partners; females only.            Social Determinants of Health   Financial Resource Strain:   . Difficulty of Paying Living Expenses:   Food Insecurity:   . Worried About Charity fundraiser in the Last Year:   . Arboriculturist in the Last Year:   Transportation Needs:   . Film/video editor (Medical):   Marland Kitchen Lack of Transportation (Non-Medical):   Physical Activity:   . Days of Exercise per Week:   . Minutes of Exercise per Session:   Stress:   . Feeling of Stress :   Social Connections:   . Frequency of Communication with Friends and Family:   . Frequency of Social Gatherings with Friends and Family:   . Attends Religious Services:   . Active Member of Clubs or Organizations:   . Attends Archivist Meetings:   Marland Kitchen Marital  Status:   Intimate Partner Violence:   . Fear of Current or Ex-Partner:   . Emotionally Abused:   Marland Kitchen Physically Abused:   . Sexually Abused:    Family Status  Relation Name Status  . Mother  Deceased at age 45       lung cancer; Defibrillator; age 62.  Marland Kitchen  Father  Deceased at age 41       COPD  . Sister  Alive  . MGM  Deceased  . MGF  Deceased  . PGM  Deceased  . PGF  Deceased  . Sister  Alive  . Sister  Alive  . Brother  Alive  . Brother  Alive  . Brother  Alive  . Son  Alive   Family History  Problem Relation Age of Onset  . Heart attack Mother        defribillator in place  . Hypertension Mother   . Hyperlipidemia Mother   . Heart disease Mother        CHF with defibrillator;  CAD.  . Diabetes Mother   . Cancer Mother 74       Lung cancer  . Hypertension Father   . Hyperlipidemia Father   . Cancer Father 10        prostate cancer s/p  implants  . COPD Father   . Hypertension Sister   . Heart disease Maternal Grandmother   . Heart disease Maternal Grandfather   . Cancer Paternal Grandmother   . Cancer Paternal Grandfather   . Hypertension Sister    Allergies  Allergen Reactions  . Penicillins Itching    tingling  . Tramadol Rash    Patient Care Team: Mar Daring, PA-C as PCP - General (Family Medicine) Terance Ice, MD (Inactive) as Consulting Physician (Cardiology) [name], MD as Consulting Physician (Family Medicine) Christene Lye, MD (General Surgery) Cristy Friedlander, MD (Oral Surgery)   Medications: Outpatient Medications Prior to Visit  Medication Sig  . atorvastatin (LIPITOR) 40 MG tablet TAKE 1 TABLET(40 MG) BY MOUTH DAILY  . clonazePAM (KLONOPIN) 1 MG tablet TAKE 1 TABLET BY MOUTH TWICE DAILY AS NEEDED  . dapagliflozin propanediol (FARXIGA) 10 MG TABS tablet Take 10 mg by mouth daily.  . ferrous sulfate 325 (65 FE) MG EC tablet Take 1 tablet (325 mg total) by mouth 3 (three) times daily with meals.  Marland Kitchen  glipiZIDE (GLUCOTROL XL) 10 MG 24 hr tablet TAKE 1 TABLET BY MOUTH EVERY DAY WITH BREAKFAST  . metFORMIN (GLUCOPHAGE) 1000 MG tablet TAKE 1 TABLET(1000 MG) BY MOUTH TWICE DAILY WITH A MEAL  . metoprolol tartrate (LOPRESSOR) 25 MG tablet TAKE 1 TABLET(25 MG) BY MOUTH TWICE DAILY  . omeprazole (PRILOSEC) 40 MG capsule TAKE 1 CAPSULE(40 MG) BY MOUTH DAILY  . sitaGLIPtin (JANUVIA) 100 MG tablet TAKE 1 TABLET BY MOUTH DAILY  . venlafaxine XR (EFFEXOR-XR) 150 MG 24 hr capsule TAKE 1 CAPSULE(150 MG) BY MOUTH DAILY WITH BREAKFAST  . [DISCONTINUED] venlafaxine XR (EFFEXOR-XR) 75 MG 24 hr capsule TAKE 1 CAPSULE(75 MG) BY MOUTH DAILY WITH BREAKFAST. TAKE WITH 150 MG CAPSULE TO TOTAL 225 MG.  . [DISCONTINUED] NONFORMULARY OR COMPOUNDED ITEM Trimix (30/1/10)-(Pap/Phent/PGE)  Test Dose  24ml vial   Qty #3 [place] [phone] Fax [phone]   No facility-administered medications prior to visit.    Review of Systems  Constitutional: Negative.   HENT: Negative.   Eyes: Negative.   Respiratory: Negative.   Cardiovascular: Negative.   Gastrointestinal: Negative.   Endocrine: Negative.   Genitourinary: Negative.   Musculoskeletal: Negative.   Skin: Negative.   Allergic/Immunologic: Negative.   Neurological: Negative.   Hematological: Negative.   Psychiatric/Behavioral: Negative.     Last CBC Lab Results  Component Value Date   WBC 7.9 04/05/2019   HGB 15.0 04/05/2019   HCT  44.5 04/05/2019   MCV 86 04/05/2019   MCH 29.1 04/05/2019   RDW 14.2 04/05/2019   PLT 256 87/86/7672   Last metabolic panel Lab Results  Component Value Date   GLUCOSE 95 04/05/2019   NA 139 04/05/2019   K 4.2 04/05/2019   CL 96 04/05/2019   CO2 24 04/05/2019   BUN 11 04/05/2019   CREATININE 0.74 (L) 04/05/2019   GFRNONAA 101 04/05/2019   GFRAA 117 04/05/2019   CALCIUM 9.6 04/05/2019   PROT 7.0 04/05/2019   ALBUMIN 4.9 04/05/2019   LABGLOB 2.1 04/05/2019   AGRATIO 2.3 (H)  04/05/2019   BILITOT 0.4 04/05/2019   ALKPHOS 68 04/05/2019   AST 18 04/05/2019   ALT 27 04/05/2019   ANIONGAP 11 11/25/2017      Objective    BP 128/78 (BP Location: Left Arm, Patient Position: Sitting, Cuff Size: Normal)   Pulse 61   Temp 97.7 F (36.5 C) (Oral)   Resp 16   Ht 5\' 10"  (1.778 m)   Wt 169 lb 3.2 oz (76.7 kg)   BMI 24.28 kg/m  BP Readings from Last 3 Encounters:  04/23/20 128/78  04/20/20 (!) 151/80  04/18/20 127/74   Wt Readings from Last 3 Encounters:  04/23/20 169 lb 3.2 oz (76.7 kg)  04/20/20 170 lb (77.1 kg)  04/17/20 172 lb (78 kg)      Physical Exam Constitutional:      General: He is not in acute distress.    Appearance: Normal appearance. He is well-developed and normal weight. He is not ill-appearing.  HENT:     Head: Normocephalic and atraumatic.     Right Ear: Tympanic membrane, ear canal and external ear normal.     Left Ear: Tympanic membrane, ear canal and external ear normal.  Eyes:     General: No scleral icterus.       Right eye: No discharge.        Left eye: No discharge.     Extraocular Movements: Extraocular movements intact.     Conjunctiva/sclera: Conjunctivae normal.     Pupils: Pupils are equal, round, and reactive to light.  Neck:     Thyroid: No thyromegaly.     Vascular: No carotid bruit.     Trachea: No tracheal deviation.  Cardiovascular:     Rate and Rhythm: Normal rate and regular rhythm.     Pulses: Normal pulses.     Heart sounds: Normal heart sounds. No murmur heard.   Pulmonary:     Effort: Pulmonary effort is normal. No respiratory distress.     Breath sounds: Normal breath sounds. No wheezing or rales.  Chest:     Chest wall: No tenderness.  Abdominal:     General: Abdomen is flat. Bowel sounds are normal. There is no distension.     Palpations: Abdomen is soft. There is no mass.     Tenderness: There is no abdominal tenderness. There is no guarding or rebound.  Musculoskeletal:        General: No  tenderness. Normal range of motion.     Cervical back: Normal range of motion and neck supple. No tenderness.     Right lower leg: No edema.     Left lower leg: No edema.  Lymphadenopathy:     Cervical: No cervical adenopathy.  Skin:    General: Skin is warm and dry.     Capillary Refill: Capillary refill takes less than 2 seconds.     Findings: No  erythema or rash.  Neurological:     General: No focal deficit present.     Mental Status: He is alert and oriented to person, place, and time. Mental status is at baseline.     Cranial Nerves: No cranial nerve deficit.     Motor: No abnormal muscle tone.     Coordination: Coordination normal.     Deep Tendon Reflexes: Reflexes are normal and symmetric. Reflexes normal.  Psychiatric:        Mood and Affect: Mood normal.        Behavior: Behavior normal.        Thought Content: Thought content normal.        Judgment: Judgment normal.     Last depression screening scores PHQ 2/9 Scores 04/23/2020 01/20/2020 04/04/2019  PHQ - 2 Score 0 2 1  PHQ- 9 Score 2 7 3    Last fall risk screening Fall Risk  04/23/2020  Falls in the past year? 0  Number falls in past yr: 0  Injury with Fall? 0  Risk for fall due to : No Fall Risks  Follow up Falls evaluation completed   Last Audit-C alcohol use screening Alcohol Use Disorder Test (AUDIT) 04/23/2020  1. How often do you have a drink containing alcohol? 2  2. How many drinks containing alcohol do you have on a typical day when you are drinking? 0  3. How often do you have six or more drinks on one occasion? 1  AUDIT-C Score 3  Alcohol Brief Interventions/Follow-up AUDIT Score <7 follow-up not indicated   A score of 3 or more in women, and 4 or more in men indicates increased risk for alcohol abuse, EXCEPT if all of the points are from question 1   Results for orders placed or performed in visit on 04/23/20  POCT glycosylated hemoglobin (Hb A1C)  Result Value Ref Range   Hemoglobin A1C 6.8 (A) 4.0  - 5.6 %   Est. average glucose Bld gHb Est-mCnc 148   POCT UA - Microalbumin  Result Value Ref Range   Microalbumin Ur, POC Negative mg/L    Assessment & Plan    Routine Health Maintenance and Physical Exam  Exercise Activities and Dietary recommendations Goals   None     Immunization History  Administered Date(s) Administered  . Hepatitis B, adult 06/18/2015, 10/05/2015  . PFIZER SARS-COV-2 Vaccination 12/15/2019, 01/05/2020  . Pneumococcal Conjugate-13 08/14/2014  . Pneumococcal Polysaccharide-23 06/18/2015  . Pneumococcal-Unspecified 09/15/2004  . Td 09/16/2007  . Tdap 04/04/2019    Health Maintenance  Topic Date Due  . OPHTHALMOLOGY EXAM  12/07/2019  . FOOT EXAM  12/31/2019  . INFLUENZA VACCINE  05/14/2020 (Originally 04/15/2020)  . HEMOGLOBIN A1C  07/24/2020  . URINE MICROALBUMIN  04/23/2021  . COLONOSCOPY  06/04/2022  . TETANUS/TDAP  04/03/2029  . PNEUMOCOCCAL POLYSACCHARIDE VACCINE AGE 16-64 HIGH RISK  Completed  . COVID-19 Vaccine  Completed  . Hepatitis C Screening  Completed  . HIV Screening  Completed    Discussed health benefits of physical activity, and encouraged him to engage in regular exercise appropriate for his age and condition.  1. Annual physical exam Normal physical exam today. Will check labs as below and f/u pending lab results. If labs are stable and WNL he will not need to have these rechecked for one year at his next annual physical exam. He is to call the office in the meantime if he has any acute issue, questions or concerns. - TSH  2.  Type 2 diabetes mellitus with hyperglycemia, without long-term current use of insulin (HCC) A1c improved from 7.0 to now 6.8. Microalbumin is negative. Continue Metformin 1000mg  BID, Farxiga 10mg  daily, Januvia 100mg  daily, and glipizide XR 10mg  daily. On statin. Will check labs as below and f/u pending results. F/U in 3 months.  - CBC with Differential/Platelet - Comprehensive metabolic panel - Lipid  panel  3. Pure hypercholesterolemia Stable. Continue Atorvastatin 40mg . Will check labs as below and f/u pending results. - CBC with Differential/Platelet - Comprehensive metabolic panel - Lipid panel  4. Essential hypertension Stable. Continue Metoprolol 25mg  BID. Will check labs as below and f/u pending results. - CBC with Differential/Platelet - Comprehensive metabolic panel - Lipid panel  5. Prostate cancer screening Will check labs as below and f/u pending results. - PSA   Return in about 3 months (around 07/24/2020) for T2DM.     Reynolds Bowl, PA-C, have reviewed all documentation for this visit. The documentation on 04/23/20 for the exam, diagnosis, procedures, and orders are all accurate and complete.   Rubye Beach  [facility] [phone] (phone) [phone] (fax)  [place]

## 2020-04-21 ENCOUNTER — Other Ambulatory Visit: Payer: Self-pay | Admitting: Physician Assistant

## 2020-04-21 DIAGNOSIS — F411 Generalized anxiety disorder: Secondary | ICD-10-CM

## 2020-04-21 DIAGNOSIS — F321 Major depressive disorder, single episode, moderate: Secondary | ICD-10-CM

## 2020-04-23 ENCOUNTER — Other Ambulatory Visit: Payer: Self-pay

## 2020-04-23 ENCOUNTER — Encounter: Payer: Self-pay | Admitting: Physician Assistant

## 2020-04-23 ENCOUNTER — Ambulatory Visit: Payer: BC Managed Care – PPO | Admitting: Physician Assistant

## 2020-04-23 VITALS — BP 128/78 | HR 61 | Temp 97.7°F | Resp 16 | Ht 70.0 in | Wt 169.2 lb

## 2020-04-23 DIAGNOSIS — E1165 Type 2 diabetes mellitus with hyperglycemia: Secondary | ICD-10-CM

## 2020-04-23 DIAGNOSIS — Z0000 Encounter for general adult medical examination without abnormal findings: Secondary | ICD-10-CM

## 2020-04-23 DIAGNOSIS — I10 Essential (primary) hypertension: Secondary | ICD-10-CM

## 2020-04-23 DIAGNOSIS — Z125 Encounter for screening for malignant neoplasm of prostate: Secondary | ICD-10-CM | POA: Diagnosis not present

## 2020-04-23 DIAGNOSIS — E7800 Pure hypercholesterolemia, unspecified: Secondary | ICD-10-CM

## 2020-04-23 LAB — POCT GLYCOSYLATED HEMOGLOBIN (HGB A1C)
Est. average glucose Bld gHb Est-mCnc: 148
Hemoglobin A1C: 6.8 % — AB (ref 4.0–5.6)

## 2020-04-23 LAB — POCT UA - MICROALBUMIN: Microalbumin Ur, POC: NEGATIVE mg/L

## 2020-04-23 NOTE — Patient Instructions (Signed)

## 2020-04-24 ENCOUNTER — Telehealth: Payer: Self-pay

## 2020-04-24 LAB — COMPREHENSIVE METABOLIC PANEL
ALT: 30 IU/L (ref 0–44)
AST: 22 IU/L (ref 0–40)
Albumin/Globulin Ratio: 2 (ref 1.2–2.2)
Albumin: 4.7 g/dL (ref 3.8–4.9)
Alkaline Phosphatase: 81 IU/L (ref 48–121)
BUN/Creatinine Ratio: 17 (ref 10–24)
BUN: 12 mg/dL (ref 8–27)
Bilirubin Total: 0.4 mg/dL (ref 0.0–1.2)
CO2: 23 mmol/L (ref 20–29)
Calcium: 9.3 mg/dL (ref 8.6–10.2)
Chloride: 98 mmol/L (ref 96–106)
Creatinine, Ser: 0.72 mg/dL — ABNORMAL LOW (ref 0.76–1.27)
GFR calc Af Amer: 117 mL/min/{1.73_m2} (ref >59)
GFR calc non Af Amer: 101 mL/min/{1.73_m2} (ref >59)
Globulin, Total: 2.4 g/dL (ref 1.5–4.5)
Glucose: 159 mg/dL — ABNORMAL HIGH (ref 65–99)
Potassium: 4.2 mmol/L (ref 3.5–5.2)
Sodium: 138 mmol/L (ref 134–144)
Total Protein: 7.1 g/dL (ref 6.0–8.5)

## 2020-04-24 LAB — CBC WITH DIFFERENTIAL/PLATELET
Basophils Absolute: 0.1 10*3/uL (ref 0.0–0.2)
Basos: 1 %
EOS (ABSOLUTE): 0.5 10*3/uL — ABNORMAL HIGH (ref 0.0–0.4)
Eos: 8 %
Hematocrit: 44.7 % (ref 37.5–51.0)
Hemoglobin: 14.5 g/dL (ref 13.0–17.7)
Immature Grans (Abs): 0 10*3/uL (ref 0.0–0.1)
Immature Granulocytes: 0 %
Lymphocytes Absolute: 1.5 10*3/uL (ref 0.7–3.1)
Lymphs: 26 %
MCH: 28.8 pg (ref 26.6–33.0)
MCHC: 32.4 g/dL (ref 31.5–35.7)
MCV: 89 fL (ref 79–97)
Monocytes Absolute: 0.4 10*3/uL (ref 0.1–0.9)
Monocytes: 7 %
Neutrophils Absolute: 3.3 10*3/uL (ref 1.4–7.0)
Neutrophils: 58 %
Platelets: 236 10*3/uL (ref 150–450)
RBC: 5.03 x10E6/uL (ref 4.14–5.80)
RDW: 14.3 % (ref 11.6–15.4)
WBC: 5.7 10*3/uL (ref 3.4–10.8)

## 2020-04-24 LAB — TSH: TSH: 1.76 u[IU]/mL (ref 0.450–4.500)

## 2020-04-24 LAB — LIPID PANEL
Chol/HDL Ratio: 4.2 ratio (ref 0.0–5.0)
Cholesterol, Total: 118 mg/dL (ref 100–199)
HDL: 28 mg/dL — ABNORMAL LOW (ref >39)
LDL Chol Calc (NIH): 46 mg/dL (ref 0–99)
Triglycerides: 281 mg/dL — ABNORMAL HIGH (ref 0–149)
VLDL Cholesterol Cal: 44 mg/dL — ABNORMAL HIGH (ref 5–40)

## 2020-04-24 LAB — PSA: Prostate Specific Ag, Serum: 0.4 ng/mL (ref 0.0–4.0)

## 2020-04-24 NOTE — Telephone Encounter (Signed)
-----   Message from Mar Daring, [place] sent at 04/24/2020  9:03 AM EDT ----- Blood count is normal. Kidney and liver function is normal. Sodium, potassium, and calcium is normal. Cholesterol is normal. Thyroid is normal. PSA is normal.

## 2020-04-24 NOTE — Telephone Encounter (Signed)
Patient advised as directed below. 

## 2020-05-29 NOTE — Progress Notes (Signed)
05/30/2020 2:06 PM   Joseph Terry 02-24-1961 [id number]  Referring provider: Mar Daring, PA-C [place],  [place] [address] Chief Complaint  Patient presents with  . Abnormal Penile Curvature    6wk foillow up    HPI: Joseph Terry is a 59 y.o. male who returns for a 1 month follow up of peyronie's disease.   During last visit, he reported pain/discomfort w/ penis curvature to the left onset 6 months ago. He estimated his curvature to be approximately 90 degrees. He denies trauma or injury to penis. Earlier that AM, he underwent erection induction and marking by Larene Beach, please see note.  Patient is 6 weeks s/p Xiaflex injections and was last seen for modeling procedure on 04/20/20. He has 2 discrete plaques, on this cycle we treated the most distal left lateral mid shaft plaque.   Most recent PSA was 0.4 on 04/23/2020.   He reports semi-erections. He notes mild improvement. His curvature has improved dramatically which is previously reported about 70%.  He subjectively describes that his curvature is now about 20%.  This is still distressing to him.  He has not tried intercourse.  PMH: Past Medical History:  Diagnosis Date  . Anxiety state, unspecified   . Barrett's esophagus   . CAD (coronary artery disease), severe multivessel CAD surgical consult for CABG    a. 12/2011 Cath: 3 vessel CAD with normal EF;  b. 12/2011 CABG x 5: LIMA to LAD, SVG to distal RCA with sequential SVG to PDA, SVG to OM, and SVG to D2;  c. 03/2014 ETT: Ex time 6:21, 12mm horizontal ST dep in V3-V6, HTN response.  . Depression   . Diaphragmatic hernia without mention of obstruction or gangrene   . Diverticulosis   . GERD (gastroesophageal reflux disease)   . Hyperlipidemia   . Hypertension   . Impotence of organic origin   . Internal hemorrhoids without mention of complication   . Iron deficiency anemia due to chronic blood loss 04/15/2017  . Kidney stone   . Motion  sickness    cars, boats  . OSA on CPAP    a. Sleep study 07/2013 +sever OSA; CPAP at 10cm recommended.  . Personal history of colonic polyps   . Type II diabetes mellitus ([place])   . Wears contact lenses     Surgical History: Past Surgical History:  Procedure Laterality Date  . CARDIAC CATHETERIZATION  4 /19/ 2013   [place]  11/03/13  . COLONOSCOPY  11/01/13   multiple polyps; diverticulosis. [place]. Repeat 5 years.  . COLONOSCOPY W/ POLYPECTOMY  09/16/2007   colon polyps.  Repeat 5 years. I[facility].  . COLONOSCOPY WITH PROPOFOL N/A 06/04/2017   Procedure: COLONOSCOPY WITH PROPOFOL;  Surgeon: [name], MD;  Location: [facility];  Service: Gastroenterology;  Laterality: N/A;  needs to remain first patient stated he also suppose to get EGD also  . CORONARY ARTERY BYPASS GRAFT  01/05/2012   Procedure: CORONARY ARTERY BYPASS GRAFTING (CABG);  Surgeon: Rexene Alberts, MD;  Location: [facility];  Service: Open Heart Surgery;  Laterality: N/A;  coronary artery bypass graft times five using left internal mammary artery and right leg greater saphenous vein harvested endoscopically   . ESOPHAGOGASTRODUODENOSCOPY N/A 06/04/2017   PT NEEDS REPEAT IN 06/2020/BARRETT'S ESOPHAGUS - Surgeon: [name], MD  . [place] N/A 06/22/2017   Procedure: GIVENS CAPSULE STUDY;  Surgeon: [name], MD;  Location: [facility]  ENDOSCOPY;  Service: Endoscopy;  Laterality: N/A;  . LEFT HEART CATHETERIZATION WITH CORONARY ANGIOGRAM N/A 01/02/2012   Procedure: LEFT HEART CATHETERIZATION WITH CORONARY ANGIOGRAM;  Surgeon: Troy Sine, MD;  Location: [facility] CATH LAB;  Service: Cardiovascular;  Laterality: N/A;  . POLYPECTOMY N/A 06/04/2017   Procedure: POLYPECTOMY;  Surgeon: [name], MD;  Location: [facility];  Service: Gastroenterology;  Laterality: N/A;  . POLYSOMNOGRAPHY  07/2013   +severe OSA; CPAP at 10cm pressure recommended.  . rotator cuff curgery  2011   lt  rotator cuff  . Shoulder surgery  1997   bilateral  . WISDOM TOOTH EXTRACTION      Home Medications:  Allergies as of 05/30/2020      Reactions   Penicillins Itching   tingling   Tramadol Rash      Medication List       Accurate as of May 30, 2020  2:06 PM. If you have any questions, ask your nurse or doctor.        atorvastatin 40 MG tablet Commonly known as: LIPITOR TAKE 1 TABLET(40 MG) BY MOUTH DAILY   clonazePAM 1 MG tablet Commonly known as: KLONOPIN TAKE 1 TABLET BY MOUTH TWICE DAILY AS NEEDED   Farxiga 10 MG Tabs tablet Generic drug: dapagliflozin propanediol Take 10 mg by mouth daily.   ferrous sulfate 325 (65 FE) MG EC tablet Take 1 tablet (325 mg total) by mouth 3 (three) times daily with meals.   glipiZIDE 10 MG 24 hr tablet Commonly known as: GLUCOTROL XL TAKE 1 TABLET BY MOUTH EVERY DAY WITH BREAKFAST   metFORMIN 1000 MG tablet Commonly known as: GLUCOPHAGE TAKE 1 TABLET(1000 MG) BY MOUTH TWICE DAILY WITH A MEAL   metoprolol tartrate 25 MG tablet Commonly known as: LOPRESSOR TAKE 1 TABLET(25 MG) BY MOUTH TWICE DAILY   omeprazole 40 MG capsule Commonly known as: PRILOSEC TAKE 1 CAPSULE(40 MG) BY MOUTH DAILY   sitaGLIPtin 100 MG tablet Commonly known as: Januvia TAKE 1 TABLET BY MOUTH DAILY   venlafaxine XR 150 MG 24 hr capsule Commonly known as: EFFEXOR-XR TAKE 1 CAPSULE(150 MG) BY MOUTH DAILY WITH BREAKFAST       Allergies:  Allergies  Allergen Reactions  . Penicillins Itching    tingling  . Tramadol Rash    Family History: Family History  Problem Relation Age of Onset  . Heart attack Mother        defribillator in place  . Hypertension Mother   . Hyperlipidemia Mother   . Heart disease Mother        CHF with defibrillator;  CAD.  . Diabetes Mother   . Cancer Mother 7       Lung cancer  . Hypertension Father   . Hyperlipidemia Father   . Cancer Father 3        prostate cancer s/p  implants  . COPD Father   .  Hypertension Sister   . Heart disease Maternal Grandmother   . Heart disease Maternal Grandfather   . Cancer Paternal Grandmother   . Cancer Paternal Grandfather   . Hypertension Sister     Social History:  reports that he quit smoking about 8 years ago. His smoking use included cigarettes. He has a 52.50 pack-year smoking history. He has never used smokeless tobacco. He reports current alcohol use of about 1.0 standard drink of alcohol per week. He reports that he does not use drugs.   Physical Exam: BP (!) 156/84   Pulse  88   Ht 5\' 10"  (1.778 m)   Wt 172 lb (78 kg)   BMI 24.68 kg/m   Constitutional:  Alert and oriented, No acute distress. HEENT: [place] AT, moist mucus membranes.  Trachea midline, no masses. Cardiovascular: No clubbing, cyanosis, or edema. Respiratory: Normal respiratory effort, no increased work of breathing. GI: Abdomen is soft, nontender, nondistended, no abdominal masses GU: Circumcised phallus. Distal left lateral mid shaft plaque almost completely resolved.  He still has a large at least 1.5 cm firm midline plaque on the dorsal base of the phallus. Neurologic: Grossly intact, no focal deficits, moving all 4 extremities. Psychiatric: Normal mood and affect.  Laboratory Data:  Lab Results  Component Value Date   CREATININE 0.72 (L) 04/23/2020    Lab Results  Component Value Date   HGBA1C 6.8 (A) 04/23/2020    Assessment & Plan:    1. Peyronie's disease  S/p induction Xiaflex, more distal of the 2 plaques in the mid left shaft was treated with dramatic improvement in a curvature but he has residual approximately 20% curvature per his own report Patient is clear to resume sexual activity.  Discussed possible treatment for remaining plaque as long as he meets the criteria, risk and benefits discussed Patient agreed to a second course of Xiaflex  We will work on prior authorization and have him come in for induction, measurement, and to begin the course    [facility] [address], [place], [place] [address] [phone]  I, Selena Batten, am acting as a scribe for Dr. Hollice Espy.  I have reviewed the above documentation for accuracy and completeness, and I agree with the above.   Hollice Espy, MD

## 2020-05-30 ENCOUNTER — Ambulatory Visit: Payer: BC Managed Care – PPO | Admitting: Urology

## 2020-05-30 ENCOUNTER — Other Ambulatory Visit: Payer: Self-pay

## 2020-05-30 ENCOUNTER — Encounter: Payer: Self-pay | Admitting: Urology

## 2020-05-30 VITALS — BP 156/84 | HR 88 | Ht 70.0 in | Wt 172.0 lb

## 2020-05-30 DIAGNOSIS — N486 Induration penis plastica: Secondary | ICD-10-CM

## 2020-05-31 ENCOUNTER — Telehealth: Payer: Self-pay

## 2020-05-31 NOTE — Telephone Encounter (Signed)
Started Prior [employer] investigation for second round of Xiaflex. Form filled and faxed to [employer] [employer] and [place] Awaiting response

## 2020-06-01 ENCOUNTER — Other Ambulatory Visit: Payer: Self-pay | Admitting: Physician Assistant

## 2020-06-01 DIAGNOSIS — F418 Other specified anxiety disorders: Secondary | ICD-10-CM

## 2020-06-01 NOTE — Telephone Encounter (Signed)
Requested  medications are  due for refill today yes  Requested medications are on the active medication list yes  Last refill 8/18  Last visit August 2021  Future visit scheduled Nov 2021  Notes to clinic Not Delegated

## 2020-06-05 NOTE — Telephone Encounter (Signed)
Update from [employer], notes needed for PA. Notes faxed

## 2020-06-08 ENCOUNTER — Other Ambulatory Visit: Payer: Self-pay | Admitting: Physician Assistant

## 2020-06-08 DIAGNOSIS — E119 Type 2 diabetes mellitus without complications: Secondary | ICD-10-CM

## 2020-06-09 ENCOUNTER — Other Ambulatory Visit: Payer: Self-pay | Admitting: Physician Assistant

## 2020-06-09 DIAGNOSIS — E119 Type 2 diabetes mellitus without complications: Secondary | ICD-10-CM

## 2020-06-09 NOTE — Telephone Encounter (Signed)
Requested Prescriptions  Pending Prescriptions Disp Refills   glipiZIDE (GLUCOTROL XL) 10 MG 24 hr tablet [Pharmacy Med Name: GLIPIZIDE ER 10MG  TABLETS] 90 tablet 0    Sig: TAKE 1 TABLET BY MOUTH EVERY DAY WITH BREAKFAST     Endocrinology:  Diabetes - Sulfonylureas Passed - 06/09/2020 11:55 AM      Passed - HBA1C is between 0 and 7.9 and within 180 days    Hemoglobin A1C  Date Value Ref Range Status  04/23/2020 6.8 (A) 4.0 - 5.6 % Final   Hgb A1c MFr Bld  Date Value Ref Range Status  04/05/2019 6.6 (H) 4.8 - 5.6 % Final    Comment:             Prediabetes: 5.7 - 6.4          Diabetes: >6.4          Glycemic control for adults with diabetes: <7.0          Passed - Valid encounter within last 6 months    Recent Outpatient Visits          1 month ago Annual physical exam   [facility] Fenton Malling M, PA-C   4 months ago Type 2 diabetes mellitus with hyperglycemia, without long-term current use of insulin [facility])   [facility] [place], Clearnce Sorrel, [place]   1 year ago Annual physical exam   [place], [place]   1 year ago Type 2 diabetes mellitus without complication, without long-term current use of insulin [facility])   [place], [place], [place]   1 year ago Nonintractable headache, unspecified chronicity pattern, unspecified headache type   [place], Wendee Beavers, PA-C      Future Appointments            In 1 month [place], Clearnce Sorrel, PA-C [employer], [place]

## 2020-06-15 NOTE — Telephone Encounter (Signed)
Fax received by [employer] for additional notes on curvature. Called and spoke with [employer] and previous notes had not been received wrong fax number was given. Notes were faxed again to new fax number

## 2020-06-20 NOTE — Telephone Encounter (Signed)
Patient called the office to inquire about the status of the authorization and scheduling for Xiaflex.    Please call the patient with an update.

## 2020-06-28 NOTE — Telephone Encounter (Signed)
Left pt message, no update this week from [employer], still awaiting PA response from additional notes sent

## 2020-06-28 NOTE — Telephone Encounter (Signed)
Spoke with [employer] to clarify dosing for PA. It was explained that we are requesting medication for second plaque not a second dose for first plaque. They state that they will reach out to [facility] and update this and see if PA will be approved.

## 2020-07-02 ENCOUNTER — Other Ambulatory Visit: Payer: Self-pay | Admitting: Physician Assistant

## 2020-07-02 DIAGNOSIS — F418 Other specified anxiety disorders: Secondary | ICD-10-CM

## 2020-07-02 NOTE — Telephone Encounter (Signed)
Requested medication (s) are due for refill today:yes  Requested medication (s) are on the active medication list: yes  Last refill: 06/01/20  # 60  0 refills  Future visit scheduled  yes 07/30/20  Notes to clinic:not delegated  Requested Prescriptions  Pending Prescriptions Disp Refills   clonazePAM (KLONOPIN) 1 MG tablet [Pharmacy Med Name: CLONAZEPAM 1MG  TABLETS] 60 tablet     Sig: TAKE 1 TABLET BY MOUTH TWICE DAILY AS NEEDED      Not Delegated - Psychiatry:  Anxiolytics/Hypnotics Failed - 07/02/2020  5:59 PM      Failed - This refill cannot be delegated      Failed - Urine Drug Screen completed in last 360 days.      Passed - Valid encounter within last 6 months    Recent Outpatient Visits           2 months ago Annual physical exam   [facility] Fenton Malling M, [place]   5 months ago Type 2 diabetes mellitus with hyperglycemia, without long-term current use of insulin [facility])   [facility], Clearnce Sorrel, [place]   1 year ago Annual physical exam   [facility] Fenton Malling M, PA-C   1 year ago Type 2 diabetes mellitus without complication, without long-term current use of insulin [facility])   [facility] [place], [place], [place]   1 year ago Nonintractable headache, unspecified chronicity pattern, unspecified headache type   [facility], Wendee Beavers, PA-C       Future Appointments             In 4 weeks Marlyn Corporal, Clearnce Sorrel, PA-C [employer], [place]

## 2020-07-10 ENCOUNTER — Telehealth: Payer: Self-pay | Admitting: Physician Assistant

## 2020-07-10 DIAGNOSIS — I10 Essential (primary) hypertension: Secondary | ICD-10-CM

## 2020-07-10 DIAGNOSIS — E119 Type 2 diabetes mellitus without complications: Secondary | ICD-10-CM

## 2020-07-10 MED ORDER — METOPROLOL TARTRATE 25 MG PO TABS: 25.0000 mg | ORAL_TABLET | Freq: Two times a day (BID) | ORAL | 1 refills | Status: DC

## 2020-07-10 MED ORDER — METFORMIN HCL 1000 MG PO TABS: 1000.0000 mg | ORAL_TABLET | Freq: Two times a day (BID) | ORAL | 1 refills | Status: DC

## 2020-07-10 NOTE — Telephone Encounter (Signed)
[facility] faxed refill request for the following medications:  metFORMIN (GLUCOPHAGE) 1000 MG tablet  metoprolol tartrate (LOPRESSOR) 25 MG tablet   Please advise.

## 2020-07-11 NOTE — Telephone Encounter (Signed)
Update from [employer] states PA is still in progress, patient notified

## 2020-07-19 NOTE — Telephone Encounter (Signed)
Update from [employer] states PA is approved and they will contact patient to set up payment. Once payment is received they will then contact our office to set up medication delivery

## 2020-07-20 NOTE — Telephone Encounter (Signed)
Incoming call on triage voicemail from [employer] in regards to delivering patients medication. Call back number [phone].

## 2020-07-23 NOTE — Telephone Encounter (Signed)
Pt was calling asking for Judson Roch, wanting to know if there was any updates on shipping?

## 2020-07-24 DIAGNOSIS — N486 Induration penis plastica: Secondary | ICD-10-CM | POA: Diagnosis not present

## 2020-07-24 NOTE — Telephone Encounter (Signed)
Spoke w/ Joseph Terry at [employer] and confirmed delivery for tomorrow once medication has been received will call to schedule appointments. Patient notified

## 2020-07-27 NOTE — Telephone Encounter (Signed)
Medication received spoke with patient and he was scheduled for second plaque first cycle

## 2020-07-30 ENCOUNTER — Ambulatory Visit: Payer: Self-pay | Admitting: Physician Assistant

## 2020-07-30 NOTE — Progress Notes (Signed)
Established patient visit   Patient: Joseph Terry   DOB: 1961-08-09   59 y.o. Male  MRN: [id number] Visit Date: 08/01/2020  Today's healthcare provider: Mar Daring, PA-C   Chief Complaint  Patient presents with  . Follow-up   Subjective    HPI  Diabetes Mellitus Type II, Follow-up  Lab Results  Component Value Date   HGBA1C 7.4 (A) 08/01/2020   HGBA1C 6.8 (A) 04/23/2020   HGBA1C 7.0 (A) 01/20/2020   Wt Readings from Last 3 Encounters:  08/01/20 173 lb 4.8 oz (78.6 kg)  05/30/20 172 lb (78 kg)  04/23/20 169 lb 3.2 oz (76.7 kg)   Last seen for diabetes 3 months ago.  Management since then includes Continue Metformin 1000mg  BID, Farxiga 10mg  daily, Januvia 100mg  daily, and glipizide XR 10mg  daily . He reports excellent compliance with treatment. He is not having side effects.  Symptoms: No fatigue No foot ulcerations  Yes appetite changes No nausea  No paresthesia of the feet  No polydipsia  No polyuria No visual disturbances   No vomiting     Home blood sugar records: not being checked  Episodes of hypoglycemia? Not being checked   Current insulin regiment: none Most Recent Eye Exam: UTD Current exercise: none Current diet habits: balanced  Pertinent Labs: Lab Results  Component Value Date   CHOL 118 04/23/2020   HDL 28 (L) 04/23/2020   LDLCALC 46 04/23/2020   TRIG 281 (H) 04/23/2020   CHOLHDL 4.2 04/23/2020   Lab Results  Component Value Date   NA 138 04/23/2020   K 4.2 04/23/2020   CREATININE 0.72 (L) 04/23/2020   GFRNONAA 101 04/23/2020   GFRAA 117 04/23/2020   GLUCOSE 159 (H) 04/23/2020     -------------------------------------------------------------------------------------------------- Influenza Vaccine: Never gets flu vaccine.  Patient Active Problem List   Diagnosis Date Noted  . Essential (hemorrhagic) thrombocythemia ([place]) 01/20/2020  . Anxiety 07/08/2017  . DM (diabetes mellitus) ([place]) 07/08/2017  . Blood loss  anemia   . Polyp of sigmoid colon   . Iron deficiency anemia due to chronic blood loss 04/15/2017  . Anemia 12/26/2013  . Barrett's esophagus 12/01/2013  . Personal history of colonic polyps 10/19/2013  . OSA on CPAP 09/20/2013  . GERD (gastroesophageal reflux disease) 02/28/2013  . Coronary artery disease 08/30/2012  . Depression with anxiety 05/31/2012  . Hypertension   . Hyperlipidemia   . History of tobacco abuse 01/06/2012  . S/P CABG x 5 01/05/2012  . Unstable angina ([place]) 01/02/2012  . Cardiovascular stress test abnormal 01/02/2012  . CAD at cath, Nl LVF, severe multivessel CAD surgical consult for CABG 01/02/2012   Past Medical History:  Diagnosis Date  . Anxiety state, unspecified   . Barrett's esophagus   . CAD (coronary artery disease), severe multivessel CAD surgical consult for CABG    a. 12/2011 Cath: 3 vessel CAD with normal EF;  b. 12/2011 CABG x 5: LIMA to LAD, SVG to distal RCA with sequential SVG to PDA, SVG to OM, and SVG to D2;  c. 03/2014 ETT: Ex time 6:21, 16mm horizontal ST dep in V3-V6, HTN response.  . Depression   . Diaphragmatic hernia without mention of obstruction or gangrene   . Diverticulosis   . GERD (gastroesophageal reflux disease)   . Hyperlipidemia   . Hypertension   . Impotence of organic origin   . Internal hemorrhoids without mention of complication   . Iron deficiency anemia due to chronic blood loss  04/15/2017  . Kidney stone   . Motion sickness    cars, boats  . OSA on CPAP    a. Sleep study 07/2013 +sever OSA; CPAP at 10cm recommended.  . Personal history of colonic polyps   . Type II diabetes mellitus ([place])   . Wears contact lenses        Medications: Outpatient Medications Prior to Visit  Medication Sig  . atorvastatin (LIPITOR) 40 MG tablet TAKE 1 TABLET(40 MG) BY MOUTH DAILY  . clonazePAM (KLONOPIN) 1 MG tablet TAKE 1 TABLET BY MOUTH TWICE DAILY AS NEEDED  . dapagliflozin propanediol (FARXIGA) 10 MG TABS tablet Take 10 mg by  mouth daily.  . ferrous sulfate 325 (65 FE) MG EC tablet Take 1 tablet (325 mg total) by mouth 3 (three) times daily with meals.  Marland Kitchen glipiZIDE (GLUCOTROL XL) 10 MG 24 hr tablet TAKE 1 TABLET BY MOUTH EVERY DAY WITH BREAKFAST  . metFORMIN (GLUCOPHAGE) 1000 MG tablet Take 1 tablet (1,000 mg total) by mouth 2 (two) times daily with a meal.  . metoprolol tartrate (LOPRESSOR) 25 MG tablet Take 1 tablet (25 mg total) by mouth 2 (two) times daily.  Marland Kitchen omeprazole (PRILOSEC) 40 MG capsule TAKE 1 CAPSULE(40 MG) BY MOUTH DAILY  . sitaGLIPtin (JANUVIA) 100 MG tablet TAKE 1 TABLET BY MOUTH DAILY  . venlafaxine XR (EFFEXOR-XR) 150 MG 24 hr capsule TAKE 1 CAPSULE(150 MG) BY MOUTH DAILY WITH BREAKFAST   No facility-administered medications prior to visit.    Review of Systems  Constitutional: Negative.   Respiratory: Negative.   Cardiovascular: Negative.   Neurological: Negative.     Last CBC Lab Results  Component Value Date   WBC 5.7 04/23/2020   HGB 14.5 04/23/2020   HCT 44.7 04/23/2020   MCV 89 04/23/2020   MCH 28.8 04/23/2020   RDW 14.3 04/23/2020   PLT 236 61/60/7371   Last metabolic panel Lab Results  Component Value Date   GLUCOSE 159 (H) 04/23/2020   NA 138 04/23/2020   K 4.2 04/23/2020   CL 98 04/23/2020   CO2 23 04/23/2020   BUN 12 04/23/2020   CREATININE 0.72 (L) 04/23/2020   GFRNONAA 101 04/23/2020   GFRAA 117 04/23/2020   CALCIUM 9.3 04/23/2020   PROT 7.1 04/23/2020   ALBUMIN 4.7 04/23/2020   LABGLOB 2.4 04/23/2020   AGRATIO 2.0 04/23/2020   BILITOT 0.4 04/23/2020   ALKPHOS 81 04/23/2020   AST 22 04/23/2020   ALT 30 04/23/2020   ANIONGAP 11 11/25/2017      Objective    BP 139/80 (BP Location: Left Arm, Patient Position: Sitting, Cuff Size: Normal)   Pulse 72   Temp 98.2 F (36.8 C) (Oral)   Resp 16   Wt 173 lb 4.8 oz (78.6 kg)   BMI 24.87 kg/m  BP Readings from Last 3 Encounters:  08/01/20 139/80  05/30/20 (!) 156/84  04/23/20 128/78   Wt Readings from  Last 3 Encounters:  08/01/20 173 lb 4.8 oz (78.6 kg)  05/30/20 172 lb (78 kg)  04/23/20 169 lb 3.2 oz (76.7 kg)      Physical Exam Vitals reviewed.  Constitutional:      General: He is not in acute distress.    Appearance: Normal appearance. He is well-developed. He is not ill-appearing or diaphoretic.  HENT:     Head: Normocephalic and atraumatic.  Cardiovascular:     Rate and Rhythm: Normal rate and regular rhythm.     Pulses: Normal pulses.  Heart sounds: Normal heart sounds. No murmur heard.  No friction rub. No gallop.   Pulmonary:     Effort: Pulmonary effort is normal. No respiratory distress.     Breath sounds: Normal breath sounds. No wheezing or rales.  Musculoskeletal:     Cervical back: Normal range of motion and neck supple.     Right lower leg: No edema.     Left lower leg: No edema.  Neurological:     Mental Status: He is alert.      Results for orders placed or performed in visit on 08/01/20  POCT glycosylated hemoglobin (Hb A1C)  Result Value Ref Range   Hemoglobin A1C 7.4 (A) 4.0 - 5.6 %   Est. average glucose Bld gHb Est-mCnc 166     Assessment & Plan     1. Type 2 diabetes mellitus without complication, without long-term current use of insulin (HCC) A1c did increase. Patient admits to have been drinking fruit juice more recently. He is going to cut back and try crystal light. Continue medications as prescribed, no changes. F/U in 3 months.   2. COVID-19 vaccine administered Covid 19 Booster Vaccine given to patient without complications. Patient sat for 15 minutes after administration and was tolerated well without adverse effects. - Pfizer SARS-COV-2 Vaccine   No follow-ups on file.      Reynolds Bowl, PA-C, have reviewed all documentation for this visit. The documentation on 08/01/20 for the exam, diagnosis, procedures, and orders are all accurate and complete.   Rubye Beach  [facility] [phone] (phone) [phone] (fax)  [place]

## 2020-08-01 ENCOUNTER — Encounter: Payer: Self-pay | Admitting: Physician Assistant

## 2020-08-01 ENCOUNTER — Other Ambulatory Visit: Payer: Self-pay

## 2020-08-01 ENCOUNTER — Ambulatory Visit: Payer: BC Managed Care – PPO | Admitting: Physician Assistant

## 2020-08-01 VITALS — BP 139/80 | HR 72 | Temp 98.2°F | Resp 16 | Wt 173.3 lb

## 2020-08-01 DIAGNOSIS — E119 Type 2 diabetes mellitus without complications: Secondary | ICD-10-CM | POA: Diagnosis not present

## 2020-08-01 DIAGNOSIS — Z23 Encounter for immunization: Secondary | ICD-10-CM

## 2020-08-01 LAB — POCT GLYCOSYLATED HEMOGLOBIN (HGB A1C)
Est. average glucose Bld gHb Est-mCnc: 166
Hemoglobin A1C: 7.4 % — AB (ref 4.0–5.6)

## 2020-08-14 ENCOUNTER — Encounter: Payer: Self-pay | Admitting: Physician Assistant

## 2020-08-14 ENCOUNTER — Other Ambulatory Visit: Payer: Self-pay

## 2020-08-14 ENCOUNTER — Ambulatory Visit (INDEPENDENT_AMBULATORY_CARE_PROVIDER_SITE_OTHER): Payer: BC Managed Care – PPO | Admitting: Urology

## 2020-08-14 ENCOUNTER — Encounter: Payer: Self-pay | Admitting: Urology

## 2020-08-14 ENCOUNTER — Ambulatory Visit (INDEPENDENT_AMBULATORY_CARE_PROVIDER_SITE_OTHER): Payer: BC Managed Care – PPO | Admitting: Physician Assistant

## 2020-08-14 VITALS — BP 136/74 | Ht 70.0 in | Wt 171.0 lb

## 2020-08-14 VITALS — BP 136/74 | HR 74 | Ht 70.0 in | Wt 171.0 lb

## 2020-08-14 DIAGNOSIS — N486 Induration penis plastica: Secondary | ICD-10-CM

## 2020-08-14 MED ORDER — COLLAGENASE CLOSTRID HISTOLYT 0.9 MG IJ SOLR
0.9000 mg | Freq: Once | INTRAMUSCULAR | Status: AC
  Administered 2020-08-14: 0.9 mg via INTRAMUSCULAR

## 2020-08-14 NOTE — Progress Notes (Signed)
Procedure: Xiaflex injection   Previously marked penile plaque site Remained visible with marking pen. The plaque was easily palpable at the left dorsal base. Xiaflex injection (0.58 mg) was then injected directly into the plaque at the point of maximal curvature at an angle after to being warm to room temperature using a small TB needle. He tolerated the procedure very well.   Hollice Espy, MD

## 2020-08-14 NOTE — Progress Notes (Signed)
Edex Injection  Due to Peyronie's disease with plans for Xiaflex this afternoon patient is present today for a Edex Injection for erection induction and plaque marking.  Dose: 5 mcg (0.25 mL of 20 mcg/mL solution) Location: Left corpora Lot: [id number] Exp: 01/2022  Patient tolerated well, no complications were noted  Performed by: Debroah Loop, PA-C   Additional notes: Patient denies a history of sickle cell anemia, sickle cell trait, leukemia, and multiple myeloma. Linear plaque palpable prior to injection. Satisfactory erection achieved for marking; plaque marked along the left side of the penis.  Follow up: This afternoon for Xiaflex.

## 2020-08-15 ENCOUNTER — Ambulatory Visit (INDEPENDENT_AMBULATORY_CARE_PROVIDER_SITE_OTHER): Payer: BC Managed Care – PPO | Admitting: Urology

## 2020-08-15 ENCOUNTER — Encounter: Payer: Self-pay | Admitting: Urology

## 2020-08-15 VITALS — BP 153/97 | HR 88 | Ht 70.0 in | Wt 171.0 lb

## 2020-08-15 DIAGNOSIS — N486 Induration penis plastica: Secondary | ICD-10-CM | POA: Diagnosis not present

## 2020-08-15 MED ORDER — COLLAGENASE CLOSTRID HISTOLYT 0.9 MG IJ SOLR
0.9000 mg | Freq: Once | INTRAMUSCULAR | Status: AC
  Administered 2020-08-15: 0.9 mg via INTRAMUSCULAR

## 2020-08-15 NOTE — Progress Notes (Signed)
Procedure: Xiaflex injection   Previously marked penile plaque site Remained visible with marking pen. The plaque was easily palpable at the left dorsal base.  Notably today, there was some bruising on the dorsal shaft extending up to the suprapubic area and down into the right hemiscrotum.  Xiaflex injection (0.58 mg) was then injected directly into the plaque at the point of maximal curvature at an angle after to being warm to room temperature using a small TB needle.  Injection was given slightly more proximal than the previous one yesterday. He tolerated the procedure very well.   Hollice Espy, MD

## 2020-08-16 NOTE — Progress Notes (Signed)
Procedure:  Xiaflex modeling Patient's Peyronie's plaque is identified. It is palpated on the left dorsal base.  Wearing gloves, I grasped the plaque about 1 cm proximal and distal to the injection site avoiding direct pressure on the injection site.  Using the target plaque as a fulcrum point, I  used both hands to apply firm, steady pressure to elongate and stretch the plaque.  The penis was bent opposite to the patient's penile curvature, with stretching to the point of moderate resistance. I held pressure for 30 seconds then released. After a 30 second rest period, I repeated the penile modeling technique for a total of 3 modeling attempts at 30 seconds for each attempt.

## 2020-08-17 ENCOUNTER — Encounter: Payer: Self-pay | Admitting: Urology

## 2020-08-17 ENCOUNTER — Other Ambulatory Visit: Payer: Self-pay

## 2020-08-17 ENCOUNTER — Ambulatory Visit (INDEPENDENT_AMBULATORY_CARE_PROVIDER_SITE_OTHER): Payer: BC Managed Care – PPO | Admitting: Urology

## 2020-08-17 VITALS — BP 150/78 | HR 66 | Ht 70.0 in | Wt 171.0 lb

## 2020-08-17 DIAGNOSIS — N486 Induration penis plastica: Secondary | ICD-10-CM

## 2020-08-31 ENCOUNTER — Telehealth: Payer: Self-pay | Admitting: Physician Assistant

## 2020-08-31 DIAGNOSIS — R519 Headache, unspecified: Secondary | ICD-10-CM | POA: Diagnosis not present

## 2020-08-31 DIAGNOSIS — F418 Other specified anxiety disorders: Secondary | ICD-10-CM

## 2020-08-31 DIAGNOSIS — M5412 Radiculopathy, cervical region: Secondary | ICD-10-CM | POA: Diagnosis not present

## 2020-08-31 DIAGNOSIS — M9903 Segmental and somatic dysfunction of lumbar region: Secondary | ICD-10-CM | POA: Diagnosis not present

## 2020-08-31 DIAGNOSIS — M9901 Segmental and somatic dysfunction of cervical region: Secondary | ICD-10-CM | POA: Diagnosis not present

## 2020-08-31 MED ORDER — VENLAFAXINE HCL ER 150 MG PO CP24: ORAL_CAPSULE | ORAL | 1 refills | Status: DC

## 2020-08-31 NOTE — Telephone Encounter (Signed)
[facility] faxed refill request for the following medications:  venlafaxine XR (EFFEXOR-XR) 150 MG 24 hr capsule  Last Rx: 01/20/2020 90 day supply with 1 refill LOV: 08/01/2020 Please advise. Thanks TNP

## 2020-09-04 ENCOUNTER — Telehealth: Payer: Self-pay | Admitting: Physician Assistant

## 2020-09-04 DIAGNOSIS — E1159 Type 2 diabetes mellitus with other circulatory complications: Secondary | ICD-10-CM

## 2020-09-04 MED ORDER — DAPAGLIFLOZIN PROPANEDIOL 10 MG PO TABS: 10.0000 mg | ORAL_TABLET | Freq: Every day | ORAL | 1 refills | Status: DC

## 2020-09-04 NOTE — Telephone Encounter (Signed)
[facility] faxed refill request for the following medications:  dapagliflozin propanediol (FARXIGA) 10 MG TABS tablet  Please advise. Thanks, [employer]

## 2020-09-05 ENCOUNTER — Other Ambulatory Visit: Payer: Self-pay | Admitting: Physician Assistant

## 2020-09-05 NOTE — Progress Notes (Signed)
[facility] sent yesterday

## 2020-09-24 ENCOUNTER — Other Ambulatory Visit: Payer: Self-pay | Admitting: Physician Assistant

## 2020-09-24 DIAGNOSIS — K2270 Barrett's esophagus without dysplasia: Secondary | ICD-10-CM

## 2020-09-24 MED ORDER — OMEPRAZOLE 40 MG PO CPDR: DELAYED_RELEASE_CAPSULE | ORAL | 1 refills | Status: DC

## 2020-09-24 NOTE — Telephone Encounter (Signed)
Pt states [facility] was told by [facility] on [address] that they have shut down-closed. They advised pt he needed to call his dr and have them send his rx to a [place].  Pt requesting omeprazole (PRILOSEC) 40 MG capsule Sent to [facility], [place]

## 2020-09-26 ENCOUNTER — Other Ambulatory Visit: Payer: Self-pay

## 2020-09-26 ENCOUNTER — Ambulatory Visit: Payer: BC Managed Care – PPO | Admitting: Urology

## 2020-09-26 VITALS — BP 150/80 | HR 80 | Ht 70.0 in | Wt 170.0 lb

## 2020-09-26 DIAGNOSIS — N5203 Combined arterial insufficiency and corporo-venous occlusive erectile dysfunction: Secondary | ICD-10-CM | POA: Diagnosis not present

## 2020-09-26 DIAGNOSIS — N486 Induration penis plastica: Secondary | ICD-10-CM | POA: Diagnosis not present

## 2020-09-26 MED ORDER — SILDENAFIL CITRATE 20 MG PO TABS: ORAL_TABLET | ORAL | 11 refills | Status: DC

## 2020-09-26 NOTE — Progress Notes (Signed)
09/26/2020 3:04 PM   Joseph Terry 08-19-1961 [id number]  Referring provider: Mar Daring, PA-C [address],  [place] [id number]  Chief Complaint  Patient presents with  . Abnormal Penile Curvature    HPI: 60 year old male with significant Peyronie's disease status post 2 cycles of Xiaflex returns today for follow-up.  He had 2 distinct plaques for each of which he underwent a complete cycle for a total of 2 cycles.  He completed his last cycle in 08/2020.  He reports that he has had extremely good response to the medication.  He is only had a partial erection since then but they were almost completely straight and he no longer has any penile curvature.  He did have some postprocedural pain but this is also resolved.  He has not been sexually active since the procedure.  He continues to complain of erectile dysfunction.  He previously tried low-dose sildenafil which was not effective.  He reports today that he has headaches with Cialis.   PMH: Past Medical History:  Diagnosis Date  . Anxiety state, unspecified   . Barrett's esophagus   . CAD (coronary artery disease), severe multivessel CAD surgical consult for CABG    a. 12/2011 Cath: 3 vessel CAD with normal EF;  b. 12/2011 CABG x 5: LIMA to LAD, SVG to distal RCA with sequential SVG to PDA, SVG to OM, and SVG to D2;  c. 03/2014 ETT: Ex time 6:21, 5mm horizontal ST dep in V3-V6, HTN response.  . Depression   . Diaphragmatic hernia without mention of obstruction or gangrene   . Diverticulosis   . GERD (gastroesophageal reflux disease)   . Hyperlipidemia   . Hypertension   . Impotence of organic origin   . Internal hemorrhoids without mention of complication   . Iron deficiency anemia due to chronic blood loss 04/15/2017  . Kidney stone   . Motion sickness    cars, boats  . OSA on CPAP    a. Sleep study 07/2013 +sever OSA; CPAP at 10cm recommended.  . Personal history of colonic polyps   .  Type II diabetes mellitus ([place])   . Wears contact lenses     Surgical History: Past Surgical History:  Procedure Laterality Date  . CARDIAC CATHETERIZATION  4 /19/ 2013   [place]  11/03/13  . COLONOSCOPY  11/01/13   multiple polyps; diverticulosis. [place]. Repeat 5 years.  . COLONOSCOPY W/ POLYPECTOMY  09/16/2007   colon polyps.  Repeat 5 years. [place].  . COLONOSCOPY WITH PROPOFOL N/A 06/04/2017   Procedure: COLONOSCOPY WITH PROPOFOL;  Surgeon: [name], MD;  Location: [place];  Service: Gastroenterology;  Laterality: N/A;  needs to remain first patient stated he also suppose to get EGD also  . CORONARY ARTERY BYPASS GRAFT  01/05/2012   Procedure: CORONARY ARTERY BYPASS GRAFTING (CABG);  Surgeon: Rexene Alberts, MD;  Location: [place] [facility];  Service: Open Heart Surgery;  Laterality: N/A;  coronary artery bypass graft times five using left internal mammary artery and right leg greater saphenous vein harvested endoscopically   . ESOPHAGOGASTRODUODENOSCOPY N/A 06/04/2017   PT NEEDS REPEAT IN 06/2020/BARRETT'S ESOPHAGUS - Surgeon: [name], MD  . GIVENS CAPSULE STUDY N/A 06/22/2017   Procedure: GIVENS CAPSULE STUDY;  Surgeon: [name], MD;  Location: [facility] ENDOSCOPY;  Service: Endoscopy;  Laterality: N/A;  . LEFT HEART CATHETERIZATION WITH CORONARY ANGIOGRAM N/A 01/02/2012   Procedure: LEFT HEART CATHETERIZATION WITH CORONARY ANGIOGRAM;  Surgeon: Troy Sine, MD;  Location: [facility] CATH LAB;  Service: Cardiovascular;  Laterality: N/A;  . POLYPECTOMY N/A 06/04/2017   Procedure: POLYPECTOMY;  Surgeon: [name], MD;  Location: [facility];  Service: Gastroenterology;  Laterality: N/A;  . POLYSOMNOGRAPHY  07/2013   +severe OSA; CPAP at 10cm pressure recommended.  . rotator cuff curgery  2011   lt rotator cuff  . Shoulder surgery  1997   bilateral  . WISDOM TOOTH EXTRACTION      Home Medications:  Allergies as of 09/26/2020      Reactions    Penicillins Itching   tingling   Tramadol Rash      Medication List       Accurate as of September 26, 2020  3:04 PM. If you have any questions, ask your nurse or doctor.        atorvastatin 40 MG tablet Commonly known as: LIPITOR TAKE 1 TABLET(40 MG) BY MOUTH DAILY   clonazePAM 1 MG tablet Commonly known as: KLONOPIN TAKE 1 TABLET BY MOUTH TWICE DAILY AS NEEDED   dapagliflozin propanediol 10 MG Tabs tablet Commonly known as: Farxiga Take 1 tablet (10 mg total) by mouth daily.   ferrous sulfate 325 (65 FE) MG EC tablet Take 1 tablet (325 mg total) by mouth 3 (three) times daily with meals.   glipiZIDE 10 MG 24 hr tablet Commonly known as: GLUCOTROL XL TAKE 1 TABLET BY MOUTH EVERY DAY WITH BREAKFAST   metFORMIN 1000 MG tablet Commonly known as: GLUCOPHAGE Take 1 tablet (1,000 mg total) by mouth 2 (two) times daily with a meal.   metoprolol tartrate 25 MG tablet Commonly known as: LOPRESSOR Take 1 tablet (25 mg total) by mouth 2 (two) times daily.   omeprazole 40 MG capsule Commonly known as: PRILOSEC TAKE 1 CAPSULE(40 MG) BY MOUTH DAILY   sildenafil 20 MG tablet Commonly known as: Revatio 3-5 tablets 1 hour prior to intercourse Started by: Hollice Espy, MD   sitaGLIPtin 100 MG tablet Commonly known as: Januvia TAKE 1 TABLET BY MOUTH DAILY   venlafaxine XR 150 MG 24 hr capsule Commonly known as: EFFEXOR-XR TAKE 1 CAPSULE(150 MG) BY MOUTH DAILY WITH BREAKFAST       Allergies:  Allergies  Allergen Reactions  . Penicillins Itching    tingling  . Tramadol Rash    Family History: Family History  Problem Relation Age of Onset  . Heart attack Mother        defribillator in place  . Hypertension Mother   . Hyperlipidemia Mother   . Heart disease Mother        CHF with defibrillator;  CAD.  . Diabetes Mother   . Cancer Mother 75       Lung cancer  . Hypertension Father   . Hyperlipidemia Father   . Cancer Father 63        prostate cancer s/p   implants  . COPD Father   . Hypertension Sister   . Heart disease Maternal Grandmother   . Heart disease Maternal Grandfather   . Cancer Paternal Grandmother   . Cancer Paternal Grandfather   . Hypertension Sister     Social History:  reports that he quit smoking about 8 years ago. His smoking use included cigarettes. He has a 52.50 pack-year smoking history. He has never used smokeless tobacco. He reports current alcohol use of about 1.0 standard drink of alcohol per week. He reports that he does not use drugs.   Physical  Exam: BP (!) 150/80   Pulse 80   Ht 5\' 10"  (1.778 m)   Wt 170 lb (77.1 kg)   BMI 24.39 kg/m   Constitutional:  Alert and oriented, No acute distress. HEENT: [place] AT, moist mucus membranes.  Trachea midline, no masses. Cardiovascular: No clubbing, cyanosis, or edema. Respiratory: Normal respiratory effort, no increased work of breathing. Skin: No rashes, bruises or suspicious lesions. Neurologic: Grossly intact, no focal deficits, moving all 4 extremities. Psychiatric: Normal mood and affect.  Assessment & Plan:    1. Peyronie's disease Excellent response to Xiaflex x2 continue to monitor and return if he is dissatisfied or has recurrence  Clear to pursue sexual activity at this point  2. Combined arterial insufficiency and corporo-venous occlusive erectile dysfunction Encouraged him to try higher dose of sildenafil which was sent to his pharmacy today, up to 100 mg.  Discussed optimal administration.  Possible side effects also reviewed.  He also underwent trimix teaching back in 02/2020 and has this prescription as well.  He may use either or treatment options.   Follow-up as needed  Hollice Espy, MD  [facility] [address], [place], [place] [address] [phone]

## 2020-10-04 ENCOUNTER — Other Ambulatory Visit: Payer: Self-pay | Admitting: Physician Assistant

## 2020-10-04 DIAGNOSIS — E119 Type 2 diabetes mellitus without complications: Secondary | ICD-10-CM

## 2020-10-04 MED ORDER — SITAGLIPTIN PHOSPHATE 100 MG PO TABS: ORAL_TABLET | ORAL | 0 refills | Status: DC

## 2020-10-09 ENCOUNTER — Other Ambulatory Visit: Payer: Self-pay | Admitting: Family Medicine

## 2020-10-09 DIAGNOSIS — I10 Essential (primary) hypertension: Secondary | ICD-10-CM

## 2020-10-09 DIAGNOSIS — E119 Type 2 diabetes mellitus without complications: Secondary | ICD-10-CM

## 2020-11-02 ENCOUNTER — Encounter: Payer: Self-pay | Admitting: Physician Assistant

## 2020-11-02 ENCOUNTER — Ambulatory Visit: Payer: BC Managed Care – PPO | Admitting: Physician Assistant

## 2020-11-02 ENCOUNTER — Other Ambulatory Visit: Payer: Self-pay

## 2020-11-02 ENCOUNTER — Ambulatory Visit: Payer: Self-pay | Admitting: Physician Assistant

## 2020-11-02 VITALS — BP 137/83 | HR 64 | Temp 97.8°F | Wt 167.0 lb

## 2020-11-02 DIAGNOSIS — E7800 Pure hypercholesterolemia, unspecified: Secondary | ICD-10-CM | POA: Diagnosis not present

## 2020-11-02 DIAGNOSIS — I10 Essential (primary) hypertension: Secondary | ICD-10-CM | POA: Diagnosis not present

## 2020-11-02 DIAGNOSIS — E1165 Type 2 diabetes mellitus with hyperglycemia: Secondary | ICD-10-CM | POA: Diagnosis not present

## 2020-11-02 DIAGNOSIS — E119 Type 2 diabetes mellitus without complications: Secondary | ICD-10-CM | POA: Diagnosis not present

## 2020-11-02 NOTE — Progress Notes (Signed)
Established patient visit   Patient: Joseph Terry   DOB: March 30, 1961   60 y.o. Male  MRN: [id number] Visit Date: 11/02/2020  Today's healthcare provider: Mar Daring, PA-C   Chief Complaint  Patient presents with  . Diabetes  . Hyperlipidemia  . Hypertension   Subjective    HPI  Diabetes Mellitus Type II, follow-up  Lab Results  Component Value Date   HGBA1C 7.4 (A) 08/01/2020   HGBA1C 6.8 (A) 04/23/2020   HGBA1C 7.0 (A) 01/20/2020   Last seen for diabetes 3 months ago.  Management since then includes continuing the same treatment, but work harder on lifestyle changes.  He reports excellent compliance with treatment. He is not having side effects.   Home blood sugar records: are not being checked at home.   Episodes of hypoglycemia? Yes 1-2 times a months    Current insulin regiment: None Most Recent Eye Exam: UTD  --------------------------------------------------------------------------------------------------- Hypertension, follow-up  BP Readings from Last 3 Encounters:  11/02/20 137/83  09/26/20 (!) 150/80  08/17/20 (!) 150/78   Wt Readings from Last 3 Encounters:  11/02/20 167 lb (75.8 kg)  09/26/20 170 lb (77.1 kg)  08/17/20 171 lb (77.6 kg)     He was last seen for hypertension 3 months ago.  He reports excellent compliance with treatment. He is not having side effects.  He is not exercising. He is adherent to low salt diet.   Outside blood pressures are not being checked.  He does not smoke.  Use of agents associated with hypertension: none.   --------------------------------------------------------------------------------------------------- Lipid/Cholesterol, follow-up  Last Lipid Panel: Lab Results  Component Value Date   CHOL 118 04/23/2020   LDLCALC 46 04/23/2020   HDL 28 (L) 04/23/2020   TRIG 281 (H) 04/23/2020    He was last seen for this 6 months ago.  Management since that visit includes no changes.  He  reports excellent compliance with treatment. He is not having side effects.   Symptoms: No appetite changes No foot ulcerations  No chest pain No chest pressure/discomfort  No dyspnea No orthopnea  No fatigue No lower extremity edema  No palpitations No paroxysmal nocturnal dyspnea  No nausea No numbness or tingling of extremity  No polydipsia No polyuria  No speech difficulty No syncope   He is following a Regular diet. Current exercise: none  Last metabolic panel Lab Results  Component Value Date   GLUCOSE 159 (H) 04/23/2020   NA 138 04/23/2020   K 4.2 04/23/2020   BUN 12 04/23/2020   CREATININE 0.72 (L) 04/23/2020   GFRNONAA 101 04/23/2020   GFRAA 117 04/23/2020   CALCIUM 9.3 04/23/2020   AST 22 04/23/2020   ALT 30 04/23/2020   The ASCVD Risk score (Goff DC Jr., et al., 2013) failed to calculate for the following reasons:   The valid total cholesterol range is 130 to 320 mg/dL  ---------------------------------------------------------------------------------------------------   Patient Active Problem List   Diagnosis Date Noted  . Essential (hemorrhagic) thrombocythemia ([place]) 01/20/2020  . Anxiety 07/08/2017  . Type 2 diabetes mellitus with hyperglycemia, without long-term current use of insulin ([place]) 07/08/2017  . Blood loss anemia   . Polyp of sigmoid colon   . Iron deficiency anemia due to chronic blood loss 04/15/2017  . Anemia 12/26/2013  . Barrett's esophagus 12/01/2013  . Personal history of colonic polyps 10/19/2013  . OSA on CPAP 09/20/2013  . GERD (gastroesophageal reflux disease) 02/28/2013  . Coronary artery disease 08/30/2012  .  Depression with anxiety 05/31/2012  . Hypertension   . Hyperlipidemia   . History of tobacco abuse 01/06/2012  . S/P CABG x 5 01/05/2012  . Unstable angina ([place]) 01/02/2012  . Cardiovascular stress test abnormal 01/02/2012  . CAD at cath, Nl LVF, severe multivessel CAD surgical consult for CABG 01/02/2012   Past  Medical History:  Diagnosis Date  . Anxiety state, unspecified   . Barrett's esophagus   . CAD (coronary artery disease), severe multivessel CAD surgical consult for CABG    a. 12/2011 Cath: 3 vessel CAD with normal EF;  b. 12/2011 CABG x 5: LIMA to LAD, SVG to distal RCA with sequential SVG to PDA, SVG to OM, and SVG to D2;  c. 03/2014 ETT: Ex time 6:21, 65mm horizontal ST dep in V3-V6, HTN response.  . Depression   . Diaphragmatic hernia without mention of obstruction or gangrene   . Diverticulosis   . GERD (gastroesophageal reflux disease)   . Hyperlipidemia   . Hypertension   . Impotence of organic origin   . Internal hemorrhoids without mention of complication   . Iron deficiency anemia due to chronic blood loss 04/15/2017  . Kidney stone   . Motion sickness    cars, boats  . OSA on CPAP    a. Sleep study 07/2013 +sever OSA; CPAP at 10cm recommended.  . Personal history of colonic polyps   . Type II diabetes mellitus ([place])   . Wears contact lenses    Social History   Tobacco Use  . Smoking status: Former Smoker    Packs/day: 1.50    Years: 35.00    Pack years: 52.50    Types: Cigarettes    Quit date: 11/14/2011    Years since quitting: 8.9  . Smokeless tobacco: Never Used  . Tobacco comment: quit 2 weeks prior to CABG 12/2011  Vaping Use  . Vaping Use: Never used  Substance Use Topics  . Alcohol use: Yes    Alcohol/week: 1.0 standard drink    Types: 1 Cans of beer per week    Comment: Drinks once per month; beer.  . Drug use: No   Allergies  Allergen Reactions  . Penicillins Itching    tingling  . Tramadol Rash     Medications: Outpatient Medications Prior to Visit  Medication Sig  . atorvastatin (LIPITOR) 40 MG tablet TAKE 1 TABLET(40 MG) BY MOUTH DAILY  . clonazePAM (KLONOPIN) 1 MG tablet TAKE 1 TABLET BY MOUTH TWICE DAILY AS NEEDED  . dapagliflozin propanediol (FARXIGA) 10 MG TABS tablet Take 1 tablet (10 mg total) by mouth daily.  . ferrous sulfate 325 (65  FE) MG EC tablet Take 1 tablet (325 mg total) by mouth 3 (three) times daily with meals.  Marland Kitchen glipiZIDE (GLUCOTROL XL) 10 MG 24 hr tablet TAKE 1 TABLET BY MOUTH EVERY DAY WITH BREAKFAST  . metFORMIN (GLUCOPHAGE) 1000 MG tablet TAKE 1 TABLET(1000 MG) BY MOUTH TWICE DAILY WITH A MEAL  . metoprolol tartrate (LOPRESSOR) 25 MG tablet TAKE 1 TABLET(25 MG) BY MOUTH TWICE DAILY  . omeprazole (PRILOSEC) 40 MG capsule TAKE 1 CAPSULE(40 MG) BY MOUTH DAILY  . sildenafil (REVATIO) 20 MG tablet 3-5 tablets 1 hour prior to intercourse  . sitaGLIPtin (JANUVIA) 100 MG tablet TAKE 1 TABLET BY MOUTH DAILY  . venlafaxine XR (EFFEXOR-XR) 150 MG 24 hr capsule TAKE 1 CAPSULE(150 MG) BY MOUTH DAILY WITH BREAKFAST   No facility-administered medications prior to visit.    Review of Systems  Constitutional: Negative.   Respiratory: Negative.   Cardiovascular: Negative.   Gastrointestinal: Negative.   Neurological: Negative for dizziness, light-headedness and headaches.        Objective    BP 137/83 (BP Location: Left Arm, Patient Position: Sitting, Cuff Size: Large)   Pulse 64   Temp 97.8 F (36.6 C) (Oral)   Wt 167 lb (75.8 kg)   BMI 23.96 kg/m    Physical Exam Vitals reviewed.  Constitutional:      General: He is not in acute distress.    Appearance: Normal appearance. He is well-developed, normal weight and well-nourished. He is not ill-appearing or diaphoretic.  HENT:     Head: Normocephalic and atraumatic.  Cardiovascular:     Rate and Rhythm: Normal rate and regular rhythm.     Heart sounds: Normal heart sounds. No murmur heard. No friction rub. No gallop.   Pulmonary:     Effort: Pulmonary effort is normal. No respiratory distress.     Breath sounds: Normal breath sounds. No wheezing or rales.  Musculoskeletal:        General: Normal range of motion.     Cervical back: Normal range of motion and neck supple.  Skin:    General: Skin is warm and dry.  Neurological:     Mental Status: He  is alert.     No results found for any visits on 11/02/20.  Assessment & Plan     1. Primary hypertension Stable. Will check labs as below and f/u pending results. - Basic Metabolic Panel (BMET) - HgB A1c  2. Type 2 diabetes mellitus without complication, without long-term current use of insulin (HCC) Stable. Continue Farxiga 10mg , Glipizide XL 10mg , Metformin 1000mg  BID, and Januvia 100mg . Will check labs as below and f/u pending results. - Basic Metabolic Panel (BMET) - HgB A1c  3. Pure hypercholesterolemia Stable. Continue Atorvastatin 40mg . Will check labs as below and f/u pending results. - Basic Metabolic Panel (BMET) - HgB A1c   Return in about 3 months (around 01/30/2021), or if symptoms worsen or fail to improve.      Reynolds Bowl, PA-C, have reviewed all documentation for this visit. The documentation on 11/02/20 for the exam, diagnosis, procedures, and orders are all accurate and complete.   Rubye Beach  [facility] [phone] (phone) [phone] (fax)  [place]

## 2020-11-03 LAB — BASIC METABOLIC PANEL
BUN/Creatinine Ratio: 15 (ref 10–24)
BUN: 12 mg/dL (ref 8–27)
CO2: 20 mmol/L (ref 20–29)
Calcium: 10.2 mg/dL (ref 8.6–10.2)
Chloride: 99 mmol/L (ref 96–106)
Creatinine, Ser: 0.78 mg/dL (ref 0.76–1.27)
GFR calc Af Amer: 113 mL/min/{1.73_m2} (ref >59)
GFR calc non Af Amer: 97 mL/min/{1.73_m2} (ref >59)
Glucose: 97 mg/dL (ref 65–99)
Potassium: 5 mmol/L (ref 3.5–5.2)
Sodium: 140 mmol/L (ref 134–144)

## 2020-11-03 LAB — HEMOGLOBIN A1C
Est. average glucose Bld gHb Est-mCnc: 148 mg/dL
Hgb A1c MFr Bld: 6.8 % — ABNORMAL HIGH (ref 4.8–5.6)

## 2020-11-22 ENCOUNTER — Telehealth: Payer: Self-pay | Admitting: Physician Assistant

## 2020-11-22 DIAGNOSIS — E119 Type 2 diabetes mellitus without complications: Secondary | ICD-10-CM

## 2020-11-22 MED ORDER — GLIPIZIDE ER 10 MG PO TB24: 10.0000 mg | ORAL_TABLET | Freq: Every day | ORAL | 1 refills | Status: DC

## 2020-11-22 NOTE — Telephone Encounter (Signed)
[facility] faxed refill request for the following medications:  glipiZIDE (GLUCOTROL XL) 10 MG 24 hr tablet  Last Rx: 06/09/20 LOV: 11/02/20 Please advise. Thanks TNP

## 2020-11-27 ENCOUNTER — Telehealth: Payer: Self-pay

## 2020-11-27 DIAGNOSIS — E119 Type 2 diabetes mellitus without complications: Secondary | ICD-10-CM

## 2020-11-27 MED ORDER — SITAGLIPTIN PHOSPHATE 100 MG PO TABS: ORAL_TABLET | ORAL | 1 refills | Status: DC

## 2020-11-27 NOTE — Telephone Encounter (Signed)
[facility] faxed refill request for the following medications:  sitaGLIPtin (JANUVIA) 100 MG tablet   Please advise.

## 2020-12-07 ENCOUNTER — Other Ambulatory Visit: Payer: Self-pay | Admitting: Physician Assistant

## 2020-12-07 DIAGNOSIS — E1159 Type 2 diabetes mellitus with other circulatory complications: Secondary | ICD-10-CM

## 2020-12-24 LAB — HM DIABETES EYE EXAM

## 2021-01-01 ENCOUNTER — Other Ambulatory Visit: Payer: Self-pay | Admitting: Physician Assistant

## 2021-01-01 DIAGNOSIS — F418 Other specified anxiety disorders: Secondary | ICD-10-CM

## 2021-01-01 NOTE — Telephone Encounter (Signed)
Requested medication (s) are due for refill today: yes   Requested medication (s) are on the active medication list: yes   Last refill:  12/03/2020  Future visit scheduled:  yes   Notes to clinic:  this refill cannot be delegated    Requested Prescriptions  Pending Prescriptions Disp Refills   clonazePAM (KLONOPIN) 1 MG tablet [Pharmacy Med Name: CLONAZEPAM 1MG  TABLETS] 60 tablet     Sig: TAKE 1 TABLET BY MOUTH TWICE DAILY AS NEEDED      Not Delegated - Psychiatry:  Anxiolytics/Hypnotics Failed - 01/01/2021 12:15 PM      Failed - This refill cannot be delegated      Failed - Urine Drug Screen completed in last 360 days      Passed - Valid encounter within last 6 months    Recent Outpatient Visits           2 months ago Primary hypertension   [facility] Fenton Malling M, PA-C   5 months ago Type 2 diabetes mellitus without complication, without long-term current use of insulin [facility])   [place], [place], [place]   8 months ago Annual physical exam   [facility] Fenton Malling M, [place]   11 months ago Type 2 diabetes mellitus with hyperglycemia, without long-term current use of insulin [facility])   [facility] [place], [place], [place]   1 year ago Annual physical exam   [facility] [place], [place], [place]       Future Appointments             In 1 month Bacigalupo, Dionne Bucy, MD [facility], [place]

## 2021-01-03 ENCOUNTER — Other Ambulatory Visit: Payer: Self-pay | Admitting: Physician Assistant

## 2021-01-03 ENCOUNTER — Encounter: Payer: Self-pay | Admitting: Family Medicine

## 2021-01-03 DIAGNOSIS — K2270 Barrett's esophagus without dysplasia: Secondary | ICD-10-CM

## 2021-01-03 DIAGNOSIS — E7800 Pure hypercholesterolemia, unspecified: Secondary | ICD-10-CM

## 2021-01-03 MED ORDER — OMEPRAZOLE 40 MG PO CPDR: DELAYED_RELEASE_CAPSULE | ORAL | 1 refills | Status: DC

## 2021-01-03 MED ORDER — ATORVASTATIN CALCIUM 40 MG PO TABS: ORAL_TABLET | ORAL | 1 refills | Status: DC

## 2021-01-03 NOTE — Telephone Encounter (Signed)
Medication Refill - Medication:  atorvastatin (LIPITOR) 40 MG tablet 90 tablet 1 04/12/2020    Sig: TAKE 1 TABLET(40 MG) BY MOUTH DAILY   Sent to pharmacy as: atorvastatin (LIPITOR) 40 MG tablet    omeprazole (PRILOSEC) 40 MG capsule 90 capsule 1 09/24/2020    Sig: TAKE 1 CAPSULE(40 MG) BY MOUTH DAILY   Sent to pharmacy as: omeprazole (PRILOSEC) 40 MG capsule      Has the patient contacted their pharmacy?  Yes  Response Call dr Preferred Pharmacy (with phone number or street name):  [id number] [address], [place] Phone:  [phone]  Fax:  [phone]     Please send asap. Pt very upset ans this request was made with another drug Clonazepam said did 3 at once and is irate that he has been waiting on other 2. Current with visits Agent: Please be advised that RX refills may take up to 3 business days. We ask that you follow-up with your pharmacy.

## 2021-02-01 ENCOUNTER — Other Ambulatory Visit: Payer: Self-pay

## 2021-02-01 ENCOUNTER — Ambulatory Visit: Payer: BC Managed Care – PPO | Admitting: Family Medicine

## 2021-02-01 ENCOUNTER — Encounter: Payer: Self-pay | Admitting: Family Medicine

## 2021-02-01 VITALS — BP 124/89 | HR 71 | Temp 98.2°F | Resp 16 | Ht 71.0 in | Wt 167.7 lb

## 2021-02-01 DIAGNOSIS — E1169 Type 2 diabetes mellitus with other specified complication: Secondary | ICD-10-CM

## 2021-02-01 DIAGNOSIS — E1159 Type 2 diabetes mellitus with other circulatory complications: Secondary | ICD-10-CM

## 2021-02-01 DIAGNOSIS — F419 Anxiety disorder, unspecified: Secondary | ICD-10-CM

## 2021-02-01 DIAGNOSIS — I25709 Atherosclerosis of coronary artery bypass graft(s), unspecified, with unspecified angina pectoris: Secondary | ICD-10-CM

## 2021-02-01 DIAGNOSIS — Z862 Personal history of diseases of the blood and blood-forming organs and certain disorders involving the immune mechanism: Secondary | ICD-10-CM

## 2021-02-01 DIAGNOSIS — F418 Other specified anxiety disorders: Secondary | ICD-10-CM

## 2021-02-01 DIAGNOSIS — E785 Hyperlipidemia, unspecified: Secondary | ICD-10-CM | POA: Diagnosis not present

## 2021-02-01 DIAGNOSIS — I152 Hypertension secondary to endocrine disorders: Secondary | ICD-10-CM

## 2021-02-01 LAB — POCT GLYCOSYLATED HEMOGLOBIN (HGB A1C)
Est. average glucose Bld gHb Est-mCnc: 146
Hemoglobin A1C: 6.7 % — AB (ref 4.0–5.6)

## 2021-02-01 MED ORDER — CLONAZEPAM 0.5 MG PO TABS: 0.5000 mg | ORAL_TABLET | Freq: Two times a day (BID) | ORAL | 0 refills | Status: DC | PRN

## 2021-02-01 MED ORDER — OZEMPIC (0.25 OR 0.5 MG/DOSE) 2 MG/1.5ML ~~LOC~~ SOPN: 0.5000 mg | PEN_INJECTOR | SUBCUTANEOUS | 2 refills | Status: DC

## 2021-02-01 NOTE — Assessment & Plan Note (Signed)
Followed by cardiology Continue risk factor management

## 2021-02-01 NOTE — Assessment & Plan Note (Signed)
Well controlled Continue current medications, except patient is unable to afford Januvia, so we will try Ozempic 0.25 mg weekly instead UTD on vaccines, eye exam foot exam completed today On Statin Discussed diet and exercise F/u in 3 months

## 2021-02-01 NOTE — Assessment & Plan Note (Signed)
Continue Effexor at current dose Discussed with the risks of long-term benzos Patient is amenable to cutting back on his Klonopin, so we will reduce from 1 mg to 0.5 mg twice daily After 1 month, if stable, will cut down to 0.5 mg once daily and then plan to stop Can increase Effexor to 225 mg daily if needed to compensate Could consider BuSpar in the future

## 2021-02-01 NOTE — Patient Instructions (Signed)
Decrease Klonopin to 0.5mg  twice daily. Next month we will go to 0.5mg  daily. Then we will try to stop.

## 2021-02-01 NOTE — Assessment & Plan Note (Signed)
Previously well controlled Continue statin Repeat FLP and CMP Goal LDL < 70 

## 2021-02-01 NOTE — Progress Notes (Signed)
Established patient visit   Patient: Joseph Terry   DOB: 1961-01-15   60 y.o. Male  MRN: [id number] Visit Date: 02/01/2021  Today's healthcare provider: Lavon Paganini, MD   Chief Complaint  Patient presents with  . Diabetes  . Hypertension  . Hyperlipidemia  . Anxiety  . Depression   Subjective    Diabetes Pertinent negatives for hypoglycemia include no dizziness or headaches. Associated symptoms include fatigue. Pertinent negatives for diabetes include no chest pain, no polyphagia and no polyuria.  Hypertension Pertinent negatives include no chest pain, headaches, palpitations or shortness of breath.  Hyperlipidemia Pertinent negatives include no chest pain or shortness of breath.    Diabetes Mellitus Type II, follow-up  Lab Results  Component Value Date   HGBA1C 6.7 (A) 02/01/2021   HGBA1C 6.8 (H) 11/02/2020   HGBA1C 7.4 (A) 08/01/2020   Last seen for diabetes 3 months ago.  Management since then includes continuing the same treatment. He reports excellent compliance with treatment. He is not having side effects.  Home blood sugar records: not checked  Episodes of hypoglycemia? No   Current insulin regiment: NONE Most Recent Eye Exam: UTD  --------------------------------------------------------------------------------------------------- Hypertension, follow-up  BP Readings from Last 3 Encounters:  02/01/21 124/89  11/02/20 137/83  09/26/20 (!) 150/80   Wt Readings from Last 3 Encounters:  02/01/21 167 lb 11.2 oz (76.1 kg)  11/02/20 167 lb (75.8 kg)  09/26/20 170 lb (77.1 kg)     He was last seen for hypertension 3 months ago.  BP at that visit was 137/83. Management since that visit includes NO CHANGES. He reports excellent compliance with treatment. He is not having side effects. He is not exercising. He is not adherent to low salt diet.   Outside blood pressures are not being checked.  He does not smoke.  Use of agents associated  w h hypertension: none.   --------------------------------------------------------------------------------------------------- Lipid/Cholesterol, follow-up  Last Lipid Panel: Lab Results  Component Value Date   CHOL 118 04/23/2020   LDLCALC 46 04/23/2020   HDL 28 (L) 04/23/2020   TRIG 281 (H) 04/23/2020    He was last seen for this 3 months ago.  Management since that visit includes NO CHANGES.  He reports excellent compliance with treatment. He is not having side effects.  Symptoms: No appetite changes No foot ulcerations  No chest pain No chest pressure/discomfort  No dyspnea No orthopnea  Yes fatigue No lower extremity edema  No palpitations No paroxysmal nocturnal dyspnea  No nausea No numbness or tingling of extremity  No polydipsia No polyuria  No speech difficulty No syncope   He is following a Regular diet. Current exercise: none  Last metabolic panel Lab Results  Component Value Date   GLUCOSE 97 11/02/2020   NA 140 11/02/2020   K 5.0 11/02/2020   BUN 12 11/02/2020   CREATININE 0.78 11/02/2020   GFRNONAA 97 11/02/2020   GFRAA 113 11/02/2020   CALCIUM 10.2 11/02/2020   AST 22 04/23/2020   ALT 30 04/23/2020   The ASCVD Risk score Mikey Bussing DC Jr., et al., 2013) failed to calculate for the following reasons:   The valid total cholesterol range is 130 to 320 mg/dL  Fatigue He states that he has recently been feeling really tired. He states that he is "worn out" from his job and he has been feeling really stressed recently.  ---------------------------------------------------------------------------------------------------   Patient Active Problem List   Diagnosis Date Noted  . Essential (hemorrhagic) thrombocythemia (  [place]) 01/20/2020  . Anxiety 07/08/2017  . Diabetes mellitus ([place]) 07/08/2017  . Polyp of sigmoid colon   . Iron deficiency anemia due to chronic blood loss 04/15/2017  . Anemia 12/26/2013  . Barrett's esophagus 12/01/2013  . Personal  history of colonic polyps 10/19/2013  . OSA on CPAP 09/20/2013  . GERD (gastroesophageal reflux disease) 02/28/2013  . Coronary artery disease involving coronary bypass graft of native heart with angina pectoris ([place]) 08/30/2012  . Depression with anxiety 05/31/2012  . Hypertension associated with diabetes ([place])   . Hyperlipidemia associated with type 2 diabetes mellitus ([place])   . History of tobacco abuse 01/06/2012  . S/P CABG x 5 01/05/2012  . Cardiovascular stress test abnormal 01/02/2012  . CAD at cath, Nl LVF, severe multivessel CAD surgical consult for CABG 01/02/2012   Social History   Tobacco Use  . Smoking status: Former Smoker    Packs/day: 1.50    Years: 35.00    Pack years: 52.50    Types: Cigarettes    Quit date: 11/14/2011    Years since quitting: 9.2  . Smokeless tobacco: Never Used  . Tobacco comment: quit 2 weeks prior to CABG 12/2011  Vaping Use  . Vaping Use: Never used  Substance Use Topics  . Alcohol use: Yes    Alcohol/week: 1.0 standard drink    Types: 1 Cans of beer per week    Comment: Drinks once per month; beer.  . Drug use: No   Allergies  Allergen Reactions  . Penicillins Itching    tingling  . Tramadol Rash       Medications: Outpatient Medications Prior to Visit  Medication Sig  . atorvastatin (LIPITOR) 40 MG tablet TAKE 1 TABLET(40 MG) BY MOUTH DAILY  . dapagliflozin propanediol (FARXIGA) 10 MG TABS tablet Take 1 tablet (10 mg total) by mouth daily.  . ferrous sulfate 325 (65 FE) MG EC tablet Take 1 tablet (325 mg total) by mouth 3 (three) times daily with meals.  Marland Kitchen glipiZIDE (GLUCOTROL XL) 10 MG 24 hr tablet Take 1 tablet (10 mg total) by mouth daily with breakfast.  . metFORMIN (GLUCOPHAGE) 1000 MG tablet TAKE 1 TABLET(1000 MG) BY MOUTH TWICE DAILY WITH A MEAL  . metoprolol tartrate (LOPRESSOR) 25 MG tablet TAKE 1 TABLET(25 MG) BY MOUTH TWICE DAILY  . omeprazole (PRILOSEC) 40 MG capsule TAKE 1 CAPSULE(40 MG) BY MOUTH DAILY  .  sildenafil (REVATIO) 20 MG tablet 3-5 tablets 1 hour prior to intercourse  . venlafaxine XR (EFFEXOR-XR) 150 MG 24 hr capsule TAKE 1 CAPSULE(150 MG) BY MOUTH DAILY WITH BREAKFAST  . [DISCONTINUED] clonazePAM (KLONOPIN) 1 MG tablet TAKE 1 TABLET BY MOUTH TWICE DAILY AS NEEDED  . [DISCONTINUED] sitaGLIPtin (JANUVIA) 100 MG tablet TAKE 1 TABLET BY MOUTH DAILY   No facility-administered medications prior to visit.    Review of Systems  Constitutional: Positive for fatigue. Negative for activity change, appetite change, chills and fever.  HENT: Negative for congestion, ear pain, rhinorrhea, sinus pain and sore throat.   Eyes: Negative for visual disturbance.  Respiratory: Negative for cough, chest tightness, shortness of breath and wheezing.   Cardiovascular: Negative for chest pain, palpitations and leg swelling.  Gastrointestinal: Negative for abdominal pain, blood in stool, diarrhea, nausea and vomiting.  Endocrine: Negative for polyphagia and polyuria.  Genitourinary: Negative for dysuria, flank pain, frequency and urgency.  Neurological: Negative for dizziness and headaches.        Objective    BP 124/89 (BP Location: Right  Arm, Patient Position: Sitting, Cuff Size: Normal)   Pulse 71   Temp 98.2 F (36.8 C) (Oral)   Resp 16   Ht 5\' 11"  (1.803 m)   Wt 167 lb 11.2 oz (76.1 kg)   SpO2 97%   BMI 23.39 kg/m  BP Readings from Last 3 Encounters:  02/01/21 124/89  11/02/20 137/83  09/26/20 (!) 150/80   Wt Readings from Last 3 Encounters:  02/01/21 167 lb 11.2 oz (76.1 kg)  11/02/20 167 lb (75.8 kg)  09/26/20 170 lb (77.1 kg)      Physical Exam Vitals reviewed.  Constitutional:      General: He is not in acute distress.    Appearance: Normal appearance. He is not diaphoretic.  HENT:     Head: Normocephalic and atraumatic.  Eyes:     General: No scleral icterus.    Conjunctiva/sclera: Conjunctivae normal.  Cardiovascular:     Rate and Rhythm: Normal rate and regular  rhythm.     Pulses: Normal pulses.     Heart sounds: Normal heart sounds. No murmur heard.   Pulmonary:     Effort: Pulmonary effort is normal. No respiratory distress.     Breath sounds: Normal breath sounds. No wheezing or rhonchi.  Abdominal:     General: There is no distension.     Palpations: Abdomen is soft.     Tenderness: There is no abdominal tenderness.  Musculoskeletal:     Cervical back: Neck supple.     Right lower leg: No edema.     Left lower leg: No edema.  Lymphadenopathy:     Cervical: No cervical adenopathy.  Skin:    General: Skin is warm and dry.     Capillary Refill: Capillary refill takes less than 2 seconds.     Findings: No rash.  Neurological:     Mental Status: He is alert and oriented to person, place, and time.     Cranial Nerves: No cranial nerve deficit.  Psychiatric:        Mood and Affect: Mood normal.        Behavior: Behavior normal.     Results for orders placed or performed in visit on 02/01/21  POCT glycosylated hemoglobin (Hb A1C)  Result Value Ref Range   Hemoglobin A1C 6.7 (A) 4.0 - 5.6 %   Est. average glucose Bld gHb Est-mCnc 146     Assessment & Plan     Problem List Items Addressed This Visit      Cardiovascular and Mediastinum   Hypertension associated with diabetes ([place]) - Primary    Well controlled Continue current medications Recheck metabolic panel F/u in 3-6 months       Relevant Medications   Semaglutide,0.25 or 0.5MG /DOS, (OZEMPIC, 0.25 OR 0.5 MG/DOSE,) 2 MG/1.5ML SOPN   Other Relevant Orders   Comprehensive metabolic panel   Coronary artery disease involving coronary bypass graft of native heart with angina pectoris ([place])    Followed by cardiology Continue risk factor management        Endocrine   Hyperlipidemia associated with type 2 diabetes mellitus ([place])    Previously well controlled Continue statin Repeat FLP and CMP Goal LDL <70      Relevant Medications   Semaglutide,0.25 or 0.5MG /DOS,  (OZEMPIC, 0.25 OR 0.5 MG/DOSE,) 2 MG/1.5ML SOPN   Other Relevant Orders   Comprehensive metabolic panel   Lipid panel   Diabetes mellitus ([place])    Well controlled Continue current medications, except patient is unable to  afford Januvia, so we will try Ozempic 0.25 mg weekly instead UTD on vaccines, eye exam foot exam completed today On Statin Discussed diet and exercise F/u in 3 months       Relevant Medications   Semaglutide,0.25 or 0.5MG /DOS, (OZEMPIC, 0.25 OR 0.5 MG/DOSE,) 2 MG/1.5ML SOPN   Other Relevant Orders   POCT glycosylated hemoglobin (Hb A1C) (Completed)     Other   Depression with anxiety   Relevant Medications   clonazePAM (KLONOPIN) 0.5 MG tablet   Anxiety    Continue Effexor at current dose Discussed with the risks of long-term benzos Patient is amenable to cutting back on his Klonopin, so we will reduce from 1 mg to 0.5 mg twice daily After 1 month, if stable, will cut down to 0.5 mg once daily and then plan to stop Can increase Effexor to 225 mg daily if needed to compensate Could consider BuSpar in the future       Other Visit Diagnoses    History of anemia       Relevant Orders   CBC w/Diff/Platelet       Return in about 3 months (around 05/04/2021) for chronic disease f/u.       Frederic Jericho Moorehead,acting as a Education administrator for Lavon Paganini, MD.,have documented all relevant documentation on the behalf of Lavon Paganini, MD,as directed by  Lavon Paganini, MD while in the presence of Lavon Paganini, MD.   I, Lavon Paganini, MD, have reviewed all documentation for this visit. The documentation on 02/01/21 for the exam, diagnosis, procedures, and orders are all accurate and complete.   , Dionne Bucy, MD, MPH [facility]

## 2021-02-01 NOTE — Assessment & Plan Note (Signed)
Well controlled Continue current medications Recheck metabolic panel F/u in 3-6 months  

## 2021-02-02 LAB — CBC WITH DIFFERENTIAL/PLATELET
Basophils Absolute: 0.1 10*3/uL (ref 0.0–0.2)
Basos: 1 %
EOS (ABSOLUTE): 0.7 10*3/uL — ABNORMAL HIGH (ref 0.0–0.4)
Eos: 8 %
Hematocrit: 48.1 % (ref 37.5–51.0)
Hemoglobin: 15.8 g/dL (ref 13.0–17.7)
Immature Grans (Abs): 0 10*3/uL (ref 0.0–0.1)
Immature Granulocytes: 1 %
Lymphocytes Absolute: 1.6 10*3/uL (ref 0.7–3.1)
Lymphs: 19 %
MCH: 28.2 pg (ref 26.6–33.0)
MCHC: 32.8 g/dL (ref 31.5–35.7)
MCV: 86 fL (ref 79–97)
Monocytes Absolute: 0.6 10*3/uL (ref 0.1–0.9)
Monocytes: 7 %
Neutrophils Absolute: 5.1 10*3/uL (ref 1.4–7.0)
Neutrophils: 64 %
Platelets: 317 10*3/uL (ref 150–450)
RBC: 5.61 x10E6/uL (ref 4.14–5.80)
RDW: 14.3 % (ref 11.6–15.4)
WBC: 8.1 10*3/uL (ref 3.4–10.8)

## 2021-02-02 LAB — LIPID PANEL
Chol/HDL Ratio: 4.3 ratio (ref 0.0–5.0)
Cholesterol, Total: 151 mg/dL (ref 100–199)
HDL: 35 mg/dL — ABNORMAL LOW (ref >39)
LDL Chol Calc (NIH): 62 mg/dL (ref 0–99)
Triglycerides: 344 mg/dL — ABNORMAL HIGH (ref 0–149)
VLDL Cholesterol Cal: 54 mg/dL — ABNORMAL HIGH (ref 5–40)

## 2021-02-02 LAB — COMPREHENSIVE METABOLIC PANEL
ALT: 36 IU/L (ref 0–44)
AST: 18 IU/L (ref 0–40)
Albumin/Globulin Ratio: 2 (ref 1.2–2.2)
Albumin: 5 g/dL — ABNORMAL HIGH (ref 3.8–4.8)
Alkaline Phosphatase: 89 IU/L (ref 44–121)
BUN/Creatinine Ratio: 18 (ref 10–24)
BUN: 14 mg/dL (ref 8–27)
Bilirubin Total: 0.3 mg/dL (ref 0.0–1.2)
CO2: 21 mmol/L (ref 20–29)
Calcium: 10.2 mg/dL (ref 8.6–10.2)
Chloride: 95 mmol/L — ABNORMAL LOW (ref 96–106)
Creatinine, Ser: 0.78 mg/dL (ref 0.76–1.27)
Globulin, Total: 2.5 g/dL (ref 1.5–4.5)
Glucose: 102 mg/dL — ABNORMAL HIGH (ref 65–99)
Potassium: 5 mmol/L (ref 3.5–5.2)
Sodium: 136 mmol/L (ref 134–144)
Total Protein: 7.5 g/dL (ref 6.0–8.5)
eGFR: 101 mL/min/{1.73_m2} (ref >59)

## 2021-02-18 ENCOUNTER — Other Ambulatory Visit: Payer: Self-pay | Admitting: Physician Assistant

## 2021-02-18 DIAGNOSIS — F418 Other specified anxiety disorders: Secondary | ICD-10-CM

## 2021-03-08 ENCOUNTER — Telehealth: Payer: Self-pay | Admitting: Physician Assistant

## 2021-03-08 ENCOUNTER — Ambulatory Visit: Payer: Self-pay | Admitting: *Deleted

## 2021-03-08 DIAGNOSIS — F418 Other specified anxiety disorders: Secondary | ICD-10-CM

## 2021-03-08 MED ORDER — CLONAZEPAM 0.5 MG PO TABS: 0.5000 mg | ORAL_TABLET | Freq: Every day | ORAL | 0 refills | Status: DC

## 2021-03-08 NOTE — Addendum Note (Signed)
Addended by: Minette Headland on: 03/08/2021 02:47 PM   Modules accepted: Orders

## 2021-03-08 NOTE — Addendum Note (Signed)
Addended by: Vernie Murders E on: 03/08/2021 02:53 PM   Modules accepted: Orders

## 2021-03-08 NOTE — Telephone Encounter (Signed)
I returned pt's call.   He has been weaned off of Klonopin and is having "I think I'm having withdrawal symptoms".    See triage notes.  I let him know I'm sending a note to Dr. Brita Romp high priority and someone will be in contact with him with further directions.  He was agreeable to this plan.

## 2021-03-08 NOTE — Telephone Encounter (Signed)
Reason for Disposition  [1] Caller has URGENT medicine question about med that PCP or specialist prescribed AND [2] triager unable to answer question    Pt has been weaned off of Klonopin.  Having withdrawal symptoms.  Answer Assessment - Initial Assessment Questions 1. NAME of MEDICATION: "What medicine are you calling about?"     Klonopin has been weaned off.   Wed and Thur and today I have felt terrible.  Dizzy, headache, nauseated and really tense.   Last Tues. Last day I took 0.5 mg tablet.  2. QUESTION: "What is your question?" (e.g., double dose of medicine, side effect)     I was on Klonopin 20 yrs.     3. PRESCRIBING HCP: "Who prescribed it?" Reason: if prescribed by specialist, call should be referred to that group.     Dr. Brita Romp started weaning. 4. SYMPTOMS: "Do you have any symptoms?"     Dizzy, headache, nausea and very tense.   5. SEVERITY: If symptoms are present, ask "Are they mild, moderate or severe?"     Severe 6. PREGNANCY:  "Is there any chance that you are pregnant?" "When was your last menstrual period?"     N/A  Protocols used: Medication Question Call-A-AH

## 2021-03-08 NOTE — Telephone Encounter (Signed)
Medication: clonazePAM (KLONOPIN) 0.5 MG tablet [[id number]] - this was sent to the wrong pharmacy. Can this be sent to the pharamcy below please/  Has the patient contacted their pharmacy? YES  (Agent: If no, request that the patient contact the pharmacy for the refill.) (Agent: If yes, when and what did the pharmacy advise?)  Preferred Pharmacy (with phone number or street name): [place], [place] AT [facility] [address] [place] [address] Phone: [phone] Fax: [phone] Hours: Not open 24 hours    Agent: Please be advised that RX refills may take up to 3 business days. We ask that you follow-up with your pharmacy.

## 2021-03-08 NOTE — Telephone Encounter (Signed)
Spoke with patient on the phone and reviewed with him last office note and plan of action for Klonopin. Patient states that he did follow directions of decreasing dose to 0.5mg  BID but states that he ran out of medication 3 days ago. Patient is suppose to be take 0.5mg  qd now for the month, according to last office note. Can you authorize refill? Patient reports withdrawal symptoms of nausea, headache and sweating. KW

## 2021-03-08 NOTE — Telephone Encounter (Signed)
Anxiety       Continue Effexor at current dose Discussed with the risks of long-term benzos Patient is amenable to cutting back on his Klonopin, so we will reduce from 1 mg to 0.5 mg twice daily After 1 month, if stable, will cut down to 0.5 mg once daily and then plan to stop Can increase Effexor to 225 mg daily if needed to compensate Could consider BuSpar in the future    This is what was documented from patients last visit on 02/01/2021. KW

## 2021-03-11 ENCOUNTER — Other Ambulatory Visit: Payer: Self-pay | Admitting: Family Medicine

## 2021-03-11 DIAGNOSIS — F418 Other specified anxiety disorders: Secondary | ICD-10-CM

## 2021-03-11 MED ORDER — CLONAZEPAM 0.5 MG PO TABS: 0.5000 mg | ORAL_TABLET | Freq: Every day | ORAL | 0 refills | Status: DC

## 2021-03-11 NOTE — Telephone Encounter (Signed)
Pts clonazePAM (KLONOPIN) 0.5 MG tablet went to the wrong pharmacy / please send to Preferred Pharmacy (with phone number or street name): [facility], [place] - [place] AT [facility] [address] [place] [address] Phone: [phone] Fax: [phone] Hours: Not open 24 hours

## 2021-04-01 ENCOUNTER — Other Ambulatory Visit: Payer: Self-pay | Admitting: Physician Assistant

## 2021-04-01 DIAGNOSIS — E119 Type 2 diabetes mellitus without complications: Secondary | ICD-10-CM

## 2021-04-01 DIAGNOSIS — E1159 Type 2 diabetes mellitus with other circulatory complications: Secondary | ICD-10-CM

## 2021-04-03 NOTE — Telephone Encounter (Signed)
[facility] faxed refill request for the following medications:   FARXIGA 10 MG TABLETS   NOT ON CURRENT MED LIST   Please advise.

## 2021-04-11 ENCOUNTER — Telehealth: Payer: Self-pay

## 2021-04-11 NOTE — Telephone Encounter (Signed)
Copied from [place] [id number]. Topic: General - Other >> Apr 11, 2021 10:20 AM Loma Boston wrote: Pls fu with pt re clonazePAM (KLONOPIN) 0.5 MG tablet 30 tablet 0 03/11/2021 / pt is seeming to have great difficullty doing without med, states was not eased off of med and has been having withdrawal issues, not willing to make an appt with Legrand Rams or even another office, states going on vacation and cannot go feeling this way. Pls FU with pt as agitated. [phone]

## 2021-04-12 ENCOUNTER — Ambulatory Visit: Payer: Self-pay | Admitting: *Deleted

## 2021-04-12 DIAGNOSIS — F418 Other specified anxiety disorders: Secondary | ICD-10-CM

## 2021-04-12 MED ORDER — CLONAZEPAM 0.5 MG PO TABS: 0.5000 mg | ORAL_TABLET | Freq: Every day | ORAL | 0 refills | Status: DC

## 2021-04-12 NOTE — Addendum Note (Signed)
Addended by: Birdie Sons on: 04/12/2021 12:14 PM   Modules accepted: Orders

## 2021-04-12 NOTE — Telephone Encounter (Signed)
Pt being weaned from [place] and is having withdrawal symptoms.   See triage notes for details.   He is leaving out of town on vacation Sat tomorrow and is requesting a refill on his rx of Klonopin.  I sent a note to [facility].

## 2021-04-12 NOTE — Telephone Encounter (Signed)
Reason for Disposition  Substance use (drug use) or unhealthy alcohol use, known or suspected    Being weaned off Klonopin.  Feels he is having withdrawal symptoms.  Answer Assessment - Initial Assessment Questions 1. DESCRIPTION: "Describe your dizziness."     Pt has been weaning off of Klonopin that he has been on for 25 yrs.  He was on 1 mg a day.   He saw Dr. Marland KitchenB" and she started weaning him off.  For some reason the rx did not get sent in for 5 days because it was sent to the wrong pharmacy.   He went cold [place] without it.   Vernie Murders, PA called in a 30 day supply of 0.5 mg.   He has taken it with the last pill being on Tues.   He is now having nausea, headaches,, dizziness and feels incoherent and uptight.   He is leaving tomorrow, Sat. For vacation for a week but feeling like this he is not sure he can go or not. He is requesting another rx for the Klonopin be called into the [facility] on [address]. He mentioned he called in yesterday and talked with someone about this but she just wanted to make me an appt with the new lady.  I can't wait that long to get an appt.   I need something for this now feeling like this.   I'm feeling worse by the day. 2. LIGHTHEADED: "Do you feel lightheaded?" (e.g., somewhat faint, woozy, weak upon standing)     Dizzy and incoherent from not being on the Klonopin. 3. VERTIGO: "Do you feel like either you or the room is spinning or tilting?" (i.e. vertigo)     No 4. SEVERITY: "How bad is it?"  "Do you feel like you are going to faint?" "Can you stand and walk?"   - MILD: Feels slightly dizzy, but walking normally.   - MODERATE: Feels unsteady when walking, but not falling; interferes with normal activities (e.g., school, work).   - SEVERE: Unable to walk without falling, or requires assistance to walk without falling; feels like passing out now.      Mild 5. ONSET:  "When did the dizziness begin?"     My last pill was Tues.   This  started around [place]. 6. AGGRAVATING FACTORS: "Does anything make it worse?" (e.g., standing, change in head position)     No 7. HEART RATE: "Can you tell me your heart rate?" "How many beats in 15 seconds?"  (Note: not all patients can do this)       N/A 8. CAUSE: "What do you think is causing the dizziness?"     Being off of the Klonopin too soon.    I've been weaned off too fast I think. 9. RECURRENT SYMPTOM: "Have you had dizziness before?" If Yes, ask: "When was the last time?" "What happened that time?"     Not asked 10. OTHER SYMPTOMS: "Do you have any other symptoms?" (e.g., fever, chest pain, vomiting, diarrhea, bleeding)       Nausea, headache, dizziness 11. PREGNANCY: "Is there any chance you are pregnant?" "When was your last menstrual period?"       N/A  Protocols used: Dizziness - Lightheadedness-A-AH

## 2021-04-12 NOTE — Telephone Encounter (Signed)
Please review. Ok to send in refill?

## 2021-05-03 ENCOUNTER — Telehealth: Payer: Self-pay | Admitting: Physician Assistant

## 2021-05-03 DIAGNOSIS — I10 Essential (primary) hypertension: Secondary | ICD-10-CM

## 2021-05-03 MED ORDER — METOPROLOL TARTRATE 25 MG PO TABS: ORAL_TABLET | ORAL | 1 refills | Status: DC

## 2021-05-03 NOTE — Telephone Encounter (Signed)
[facility] faxed refill request for the following medications:   metoprolol tartrate (LOPRESSOR) 25 MG tablet    Please advise.  

## 2021-05-06 ENCOUNTER — Encounter: Payer: Self-pay | Admitting: Family Medicine

## 2021-05-06 ENCOUNTER — Ambulatory Visit: Payer: BC Managed Care – PPO | Admitting: Family Medicine

## 2021-05-06 ENCOUNTER — Other Ambulatory Visit: Payer: Self-pay

## 2021-05-06 VITALS — BP 127/77 | HR 82 | Temp 98.4°F | Ht 70.0 in | Wt 166.6 lb

## 2021-05-06 DIAGNOSIS — F419 Anxiety disorder, unspecified: Secondary | ICD-10-CM | POA: Diagnosis not present

## 2021-05-06 DIAGNOSIS — E1169 Type 2 diabetes mellitus with other specified complication: Secondary | ICD-10-CM

## 2021-05-06 DIAGNOSIS — E1159 Type 2 diabetes mellitus with other circulatory complications: Secondary | ICD-10-CM | POA: Diagnosis not present

## 2021-05-06 DIAGNOSIS — I152 Hypertension secondary to endocrine disorders: Secondary | ICD-10-CM

## 2021-05-06 DIAGNOSIS — F418 Other specified anxiety disorders: Secondary | ICD-10-CM | POA: Diagnosis not present

## 2021-05-06 DIAGNOSIS — E785 Hyperlipidemia, unspecified: Secondary | ICD-10-CM

## 2021-05-06 DIAGNOSIS — E119 Type 2 diabetes mellitus without complications: Secondary | ICD-10-CM | POA: Diagnosis not present

## 2021-05-06 MED ORDER — CLONAZEPAM 0.5 MG PO TABS: 0.5000 mg | ORAL_TABLET | Freq: Every day | ORAL | 2 refills | Status: DC

## 2021-05-06 NOTE — Assessment & Plan Note (Signed)
-   Chronic and stable - Continue Atorvastatin - UTD on labs

## 2021-05-06 NOTE — Assessment & Plan Note (Signed)
-   Stable - Continue Effexor & Klonopin 0.5 mg daily - Will defer further medication changes at this time, but will continue to monitor

## 2021-05-06 NOTE — Assessment & Plan Note (Addendum)
-   Chronic and well-controlled Complicated by HTN and HLD - Continue Farxiga, Glipizide, Metformin, & Januvia; refer to CCM for resource assistance with medication affordability - Discontinue Ozempic (this was ordered to replace Januvia, but it is less affordable) - Check urine microalbumin - UTD on foot & eye exams

## 2021-05-06 NOTE — Assessment & Plan Note (Signed)
-   Chronic and well-controlled - Continue Metoprolol - UTD on labs

## 2021-05-06 NOTE — Progress Notes (Signed)
Established patient visit   Patient: Joseph Terry   DOB: Apr 05, 1961   60 y.o. Male  MRN: [id number] Visit Date: 05/06/2021  Today's healthcare provider: Lavon Paganini, MD   Chief Complaint  Patient presents with   Follow-up   Subjective    HPI  Hypertension - Currently taking Metoprolol; denies chest pain, leg swelling, headaches, & vision changes  Diabetes - Currently taking Farxiga, Glipizide, Januvia, & Metformin - Pt. reports difficulty affording [facility] [place] - He reported difficulty affording Januvia so was transitioned to Ozempic at last appt, but reports continued difficulty affording Ozempic and has not picked up from pharmacy  Hyperlipidemia - Currently taking atorvastatin; denies myalgias  Depression & Anxiety - Pt. states that he's taking Klonopin 0.5 mg daily, tolerating well - Report withdrawal symptoms when he is out of Klonopin - describes dizziness, "brain fog," headaches, sweating - Was previously taking 2 mg Klonopin daily for ~20 yrs, but has been tapered down to 0.5 mg in recent months - Currently on Effexor, tolerating well     Medications: Outpatient Medications Prior to Visit  Medication Sig   atorvastatin (LIPITOR) 40 MG tablet TAKE 1 TABLET(40 MG) BY MOUTH DAILY   FARXIGA 10 MG TABS tablet TAKE 1 TABLET(10 MG) BY MOUTH DAILY   ferrous sulfate 325 (65 FE) MG EC tablet Take 1 tablet (325 mg total) by mouth 3 (three) times daily with meals.   glipiZIDE (GLUCOTROL XL) 10 MG 24 hr tablet TAKE 1 TABLET(10 MG) BY MOUTH DAILY WITH BREAKFAST   metFORMIN (GLUCOPHAGE) 1000 MG tablet TAKE 1 TABLET(1000 MG) BY MOUTH TWICE DAILY WITH A MEAL   metoprolol tartrate (LOPRESSOR) 25 MG tablet TAKE 1 TABLET(25 MG) BY MOUTH TWICE DAILY   omeprazole (PRILOSEC) 40 MG capsule TAKE 1 CAPSULE(40 MG) BY MOUTH DAILY   sildenafil (REVATIO) 20 MG tablet 3-5 tablets 1 hour prior to intercourse   sitaGLIPtin (JANUVIA) 100 MG tablet Take 100 mg by mouth daily.    venlafaxine XR (EFFEXOR-XR) 150 MG 24 hr capsule TAKE 1 CAPSULE(150 MG) BY MOUTH DAILY WITH BREAKFAST   [DISCONTINUED] clonazePAM (KLONOPIN) 0.5 MG tablet Take 1 tablet (0.5 mg total) by mouth daily.   [DISCONTINUED] Semaglutide,0.25 or 0.'5MG'$ /DOS, (OZEMPIC, 0.25 OR 0.5 MG/DOSE,) 2 MG/1.5ML SOPN Inject 0.5 mg into the skin once a week.   No facility-administered medications prior to visit.    Review of Systems  Constitutional:  Negative for activity change, appetite change and fever.  HENT: Negative.    Eyes: Negative.  Negative for visual disturbance.  Respiratory: Negative.  Negative for chest tightness and shortness of breath.   Cardiovascular: Negative.  Negative for chest pain and leg swelling.  Gastrointestinal: Negative.   Endocrine: Negative.  Negative for polydipsia and polyuria.  Genitourinary: Negative.   Musculoskeletal: Negative.   Skin: Negative.   Neurological: Negative.   Psychiatric/Behavioral: Negative.        Objective    BP 127/77 (BP Location: Right Arm, Patient Position: Sitting, Cuff Size: Normal)   Pulse 82   Temp 98.4 F (36.9 C) (Oral)   Ht '5\' 10"'$  (1.778 m)   Wt 166 lb 9.6 oz (75.6 kg)   SpO2 98%   BMI 23.90 kg/m     Physical Exam Constitutional:      General: He is not in acute distress.    Appearance: Normal appearance. He is normal weight.  HENT:     Head: Normocephalic and atraumatic.     Right Ear: External ear  normal.     Left Ear: External ear normal.  Eyes:     Conjunctiva/sclera: Conjunctivae normal.  Cardiovascular:     Rate and Rhythm: Normal rate and regular rhythm.     Pulses: Normal pulses.     Heart sounds: Normal heart sounds.  Pulmonary:     Effort: Pulmonary effort is normal.     Breath sounds: Normal breath sounds.  Abdominal:     General: Abdomen is flat. Bowel sounds are normal.     Palpations: Abdomen is soft.  Skin:    General: Skin is warm and dry.  Neurological:     Mental Status: He is alert.  Psychiatric:         Behavior: Behavior normal.        Thought Content: Thought content normal.     No results found for any visits on 05/06/21.  Assessment & Plan     Problem List Items Addressed This Visit       Cardiovascular and Mediastinum   Hypertension associated with diabetes ([place]) - Primary    - Chronic and well-controlled - Continue Metoprolol - UTD on labs      Relevant Medications   sitaGLIPtin (JANUVIA) 100 MG tablet     Endocrine   Hyperlipidemia associated with type 2 diabetes mellitus (HCC)    - Chronic and stable - Continue Atorvastatin - UTD on labs      Relevant Medications   sitaGLIPtin (JANUVIA) 100 MG tablet   Diabetes mellitus ([place])    - Chronic and well-controlled - Continue Farxiga, Glipizide, Metformin, & Januvia; refer to CCM for resource assistance with medication affordability - Discontinue Ozempic - Check urine microalbumin - UTD on foot & eye exams        Relevant Medications   sitaGLIPtin (JANUVIA) 100 MG tablet   Other Relevant Orders   AMB Referral to [place], urine     Other   Depression with anxiety    - Stable - Continue Effexor & Klonopin 0.5 mg daily - Will defer further medication changes at this time, but will continue to monitor      Relevant Medications   clonazePAM (KLONOPIN) 0.5 MG tablet   Anxiety    - Stable - Continue Effexor & Klonopin 0.5 mg daily - Will defer further medication changes at this time, but will continue to monitor        Return in about 3 months (around 08/06/2021) for With new PCP, CPE.        Percell Locus, MS3   Patient seen along with MS3 student Percell Locus. I personally evaluated this patient along with the student, and verified all aspects of the history, physical exam, and medical decision making as documented by the student. I agree with the student's documentation and have made all necessary edits.  , Dionne Bucy, MD, MPH [facility]

## 2021-05-07 ENCOUNTER — Telehealth: Payer: Self-pay | Admitting: *Deleted

## 2021-05-07 LAB — SPECIMEN STATUS REPORT

## 2021-05-07 LAB — MICROALBUMIN, URINE: Microalbumin, Urine: <3 ug/mL

## 2021-05-07 NOTE — Chronic Care Management (AMB) (Signed)
  Care Management   Note  05/07/2021 Name: Joseph Terry MRN: [id number] DOB: 20-Mar-1961  Joseph Terry is a 60 y.o. year old male who is a primary care patient of Gwyneth Sprout, FNP. I reached out to [place] by phone today in response to a referral sent by Joseph Terry's health plan.    Joseph Terry was given information about care management services today including:  Care management services include personalized support from designated clinical staff supervised by his physician, including individualized plan of care and coordination with other care providers 24/7 contact phone numbers for assistance for urgent and routine care needs. The patient may stop care management services at any time by phone call to the office staff.  Patient agreed to services and verbal consent obtained.   Follow up plan: Telephone appointment with care management team member scheduled for: 06/18/2021  Julian Hy, [place] [facility]  Direct Dial: [phone]

## 2021-05-23 ENCOUNTER — Telehealth: Payer: Self-pay | Admitting: Family Medicine

## 2021-05-23 DIAGNOSIS — I10 Essential (primary) hypertension: Secondary | ICD-10-CM

## 2021-05-23 MED ORDER — METOPROLOL TARTRATE 25 MG PO TABS: ORAL_TABLET | ORAL | 1 refills | Status: DC

## 2021-05-23 NOTE — Telephone Encounter (Signed)
[facility] faxed refill request for the following medications:   metoprolol tartrate (LOPRESSOR) 25 MG tablet    Please advise.  

## 2021-06-17 ENCOUNTER — Telehealth: Payer: Self-pay

## 2021-06-17 NOTE — Progress Notes (Signed)
    Chronic Care Management Pharmacy Assistant   Name: Joseph Terry  MRN: [id number] DOB: 03/20/1961  Chart Review for clinical pharmacist on 06/18/2021.   Conditions to be addressed/monitored: CAD, HTN, HLD, DMII, Anxiety, Depression, and OSA, GERD, Barrett's Esophagus,Essential thrombocythemia, Anemia  Primary concerns for visit include: None ID   Recent office visits:  05/06/2021 Lavon Paganini MD (PCP office) Discontinue Ozempic (this was ordered to replace Januvia, but it is less affordable), restart Januvia 100 mg , AMB Referral to [place] 02/01/2021 Lavon Paganini MD (PCP office) start Ozempic 0.25 mg weekly, stop Januvia due to cost, reduce Klonopin 0.5 mg once daily, increase Effexor to 225 mg daily if needed ,   Recent consult visits:  No recent [facility] visits:  None in previous 6 months  Left Voice message to do initial question prior to patient appointment on 06/18/2021 for CCM at 1:00 pm  with Junius Argyle the Clinical pharmacist.    Medications: Outpatient Encounter Medications as of 06/17/2021  Medication Sig   atorvastatin (LIPITOR) 40 MG tablet TAKE 1 TABLET(40 MG) BY MOUTH DAILY   clonazePAM (KLONOPIN) 0.5 MG tablet Take 1 tablet (0.5 mg total) by mouth daily.   FARXIGA 10 MG TABS tablet TAKE 1 TABLET(10 MG) BY MOUTH DAILY   ferrous sulfate 325 (65 FE) MG EC tablet Take 1 tablet (325 mg total) by mouth 3 (three) times daily with meals.   glipiZIDE (GLUCOTROL XL) 10 MG 24 hr tablet TAKE 1 TABLET(10 MG) BY MOUTH DAILY WITH BREAKFAST   metFORMIN (GLUCOPHAGE) 1000 MG tablet TAKE 1 TABLET(1000 MG) BY MOUTH TWICE DAILY WITH A MEAL   metoprolol tartrate (LOPRESSOR) 25 MG tablet TAKE 1 TABLET(25 MG) BY MOUTH TWICE DAILY   omeprazole (PRILOSEC) 40 MG capsule TAKE 1 CAPSULE(40 MG) BY MOUTH DAILY   sildenafil (REVATIO) 20 MG tablet 3-5 tablets 1 hour prior to intercourse   sitaGLIPtin (JANUVIA) 100 MG tablet Take 100 mg by mouth  daily.   venlafaxine XR (EFFEXOR-XR) 150 MG 24 hr capsule TAKE 1 CAPSULE(150 MG) BY MOUTH DAILY WITH BREAKFAST   No facility-administered encounter medications on file as of 06/17/2021.    Care Gaps: Shingrix Vaccine COVID-19 Vaccine (4-Booster for Pifzer) Influenza Vaccine Hemoglobin A1C  Star Rating Drugs: Januvia 100 mg last filled 06/13/2021 for 90 day supply at [facility]. Glipizide 10 mg last filled 05/03/2021 for 90 day supply at [facility]. Atorvastatin 40 mg last filled 04/03/2021 for 90 day supply at [facility]. Metformin 1000 mg last filled 04/03/2021 for 90 day supply at [facility].  Medication Fill Gaps: None  Web designer [phone]

## 2021-06-18 ENCOUNTER — Telehealth: Payer: Self-pay | Admitting: Family Medicine

## 2021-06-18 ENCOUNTER — Ambulatory Visit: Payer: BC Managed Care – PPO

## 2021-06-18 DIAGNOSIS — E1169 Type 2 diabetes mellitus with other specified complication: Secondary | ICD-10-CM

## 2021-06-18 DIAGNOSIS — K2270 Barrett's esophagus without dysplasia: Secondary | ICD-10-CM

## 2021-06-18 MED ORDER — OMEPRAZOLE 40 MG PO CPDR: DELAYED_RELEASE_CAPSULE | ORAL | 1 refills | Status: DC

## 2021-06-18 NOTE — Telephone Encounter (Signed)
[facility] faxed refill request for the following medications:   omeprazole (PRILOSEC) 40 MG capsule   Please advise.

## 2021-06-18 NOTE — Progress Notes (Signed)
Chronic Care Management Pharmacy Note  06/18/2021 Name:  Joseph Terry MRN:  [id number] DOB:  1961-03-28  Subjective: Joseph Terry is an 60 y.o. year old male who is a primary patient of Gwyneth Sprout, [place].  The CCM team was consulted for assistance with disease management and care coordination needs.    Engaged with patient by telephone for initial visit in response to provider referral for pharmacy case management and/or care coordination services.   Consent to Services:  Patient does not qualify for CCM services. Pharmacy consulted for assistance with medication costs.  Patient Care Team: Gwyneth Sprout, FNP as PCP - General (Family Medicine) Terance Ice, MD (Inactive) as Consulting Physician (Cardiology) [name], MD as Consulting Physician (Family Medicine) Christene Lye, MD (General Surgery) Cristy Friedlander, MD (Oral Surgery) Germaine Pomfret, [facility] (Pharmacist)  Recent office visits: 05/06/2021 Lavon Paganini MD (PCP office) Discontinue Ozempic (this was ordered to replace Januvia, but it is less affordable), restart Januvia 100 mg , AMB Referral to [place] 02/01/2021 Lavon Paganini MD (PCP office) start Ozempic 0.25 mg weekly, stop Januvia due to cost, reduce Klonopin 0.5 mg once daily, increase Effexor to 225 mg daily if needed ,  Recent consult visits: No recent [facility] visits: None in previous 6 months   Objective:  Lab Results  Component Value Date   CREATININE 0.78 02/01/2021   BUN 14 02/01/2021   GFRNONAA 97 11/02/2020   GFRAA 113 11/02/2020   NA 136 02/01/2021   K 5.0 02/01/2021   CALCIUM 10.2 02/01/2021   CO2 21 02/01/2021   GLUCOSE 102 (H) 02/01/2021    Lab Results  Component Value Date/Time   HGBA1C 6.7 (A) 02/01/2021 08:44 AM   HGBA1C 6.8 (H) 11/02/2020 08:59 AM   HGBA1C 7.4 (A) 08/01/2020 10:09 AM   HGBA1C 6.6 (H) 04/05/2019 09:15 AM   MICROALBUR Negative 04/23/2020  08:29 AM   MICROALBUR Negative 03/31/2018 09:29 AM    Last diabetic Eye exam:  Lab Results  Component Value Date/Time   HMDIABEYEEXA No Retinopathy 12/24/2020 12:00 AM    Last diabetic Foot exam: No results found for: HMDIABFOOTEX   Lab Results  Component Value Date   CHOL 151 02/01/2021   HDL 35 (L) 02/01/2021   LDLCALC 62 02/01/2021   TRIG 344 (H) 02/01/2021   CHOLHDL 4.3 02/01/2021    Hepatic Function Latest Ref Rng & Units 02/01/2021 04/23/2020 04/05/2019  Total Protein 6.0 - 8.5 g/dL 7.5 7.1 7.0  Albumin 3.8 - 4.8 g/dL 5.0(H) 4.7 4.9  AST 0 - 40 IU/L _0 ALT 0 - 44 IU/L 36 30 27  Alk Phosphatase 44 - 121 IU/L 89 81 68  Total Bilirubin 0.0 - 1.2 mg/dL 0.3 0.4 0.4  Bilirubin, Direct 0.00 - 0.40 mg/dL - - -    Lab Results  Component Value Date/Time   TSH 1.760 04/23/2020 08:52 AM   TSH 2.890 04/05/2019 09:15 AM    CBC Latest Ref Rng & Units 02/01/2021 04/23/2020 04/05/2019  WBC 3.4 - 10.8 x10E3/uL 8.1 5.7 7.9  Hemoglobin 13.0 - 17.7 g/dL 15.8 14.5 15.0  Hematocrit 37.5 - 51.0 % 48.1 44.7 44.5  Platelets 150 - 450 x10E3/uL 317 236 256    Lab Results  Component Value Date/Time   VD25OH 39 02/28/2013 08:32 AM   VD25OH 37 05/31/2012 08:49 AM    Clinical ASCVD: Yes  The 10-year ASCVD risk score (Arnett DK, et  al., 2019) is: 21.3%   Values used to calculate the score:     Age: 18 years     Sex: Male     Is Non-Hispanic African American: No     Diabetic: Yes     Tobacco smoker: No     Systolic Blood Pressure: 381 mmHg     Is BP treated: Yes     HDL Cholesterol: 35 mg/dL     Total Cholesterol: 151 mg/dL    Depression screen [facility] 2/9 05/06/2021 02/01/2021 11/02/2020  Decreased Interest 0 1 0  Down, Depressed, Hopeless 0 1 0  PHQ - 2 Score 0 2 0  Altered sleeping 0 1 0  Tired, decreased energy 1 2 0  Change in appetite 0 1 0  Feeling bad or failure about yourself  0 1 0  Trouble concentrating 0 1 0  Moving slowly or fidgety/restless 0 0 0  Suicidal thoughts 0  0 0  PHQ-9 Score 1 8 0  Difficult doing work/chores Not difficult at all Somewhat difficult Not difficult at all  Some recent data might be hidden    Social History   Tobacco Use  Smoking Status Former   Packs/day: 1.50   Years: 35.00   Pack years: 52.50   Types: Cigarettes   Quit date: 11/14/2011   Years since quitting: 9.6  Smokeless Tobacco Never  Tobacco Comments   quit 2 weeks prior to CABG 12/2011   BP Readings from Last 3 Encounters:  05/06/21 127/77  02/01/21 124/89  11/02/20 137/83   Pulse Readings from Last 3 Encounters:  05/06/21 82  02/01/21 71  11/02/20 64   Wt Readings from Last 3 Encounters:  05/06/21 166 lb 9.6 oz (75.6 kg)  02/01/21 167 lb 11.2 oz (76.1 kg)  11/02/20 167 lb (75.8 kg)   BMI Readings from Last 3 Encounters:  05/06/21 23.90 kg/m  02/01/21 23.39 kg/m  11/02/20 23.96 kg/m    Assessment/Interventions: Review of patient past medical history, allergies, medications, health status, including review of consultants reports, laboratory and other test data, was performed as part of comprehensive evaluation and provision of chronic care management services.   SDOH:  (Social Determinants of Health) assessments and interventions performed: Yes  SDOH Screenings   Alcohol Screen: Low Risk    Last Alcohol Screening Score (AUDIT): 3  Depression (PHQ2-9): Low Risk    PHQ-2 Score: 1  Financial Resource Strain: Not on file  Food Insecurity: Not on file  Housing: Not on file  Physical Activity: Not on file  Social Connections: Not on file  Stress: Not on file  Tobacco Use: Medium Risk   Smoking Tobacco Use: Former   Smokeless Tobacco Use: Never  Transportation Needs: Not on file    CCM Care Plan  Allergies  Allergen Reactions   Tramadol Rash    Medications Reviewed Today     Reviewed by Virginia Crews, MD (Physician) on 05/06/21 at 1557  Med List Status: <None>   Medication Order Taking? Sig Documenting Provider Last Dose  Status Informant  atorvastatin (LIPITOR) 40 MG tablet [id number] Yes TAKE 1 TABLET(40 MG) BY MOUTH DAILY Bacigalupo, Dionne Bucy, MD Taking Active   clonazePAM (KLONOPIN) 0.5 MG tablet [id number]  Take 1 tablet (0.5 mg total) by mouth daily. Virginia Crews, MD  Active   FARXIGA 10 MG TABS tablet [id number] Yes TAKE 1 TABLET(10 MG) BY MOUTH DAILY Bacigalupo, Dionne Bucy, MD Taking Active   ferrous sulfate 325 (65 FE) MG EC tablet  [id number] Yes Take 1 tablet (325 mg total) by mouth 3 (three) times daily with meals. Fenton Malling M, [place] Taking Active   glipiZIDE (GLUCOTROL XL) 10 MG 24 hr tablet [id number] Yes TAKE 1 TABLET(10 MG) BY MOUTH DAILY WITH BREAKFAST Virginia Crews, MD Taking Active   metFORMIN (GLUCOPHAGE) 1000 MG tablet [id number] Yes TAKE 1 TABLET(1000 MG) BY MOUTH TWICE DAILY WITH A MEAL Mar Daring, PA-C Taking Active   metoprolol tartrate (LOPRESSOR) 25 MG tablet [id number] Yes TAKE 1 TABLET(25 MG) BY MOUTH TWICE DAILY Bacigalupo, Dionne Bucy, MD Taking Active   omeprazole (PRILOSEC) 40 MG capsule [id number] Yes TAKE 1 CAPSULE(40 MG) BY MOUTH DAILY Bacigalupo, Dionne Bucy, MD Taking Active   sildenafil (REVATIO) 20 MG tablet [id number] Yes 3-5 tablets 1 hour prior to intercourse Hollice Espy, MD Taking Active   sitaGLIPtin (JANUVIA) 100 MG tablet [id number] Yes Take 100 mg by mouth daily. [provider]  Active   venlafaxine XR (EFFEXOR-XR) 150 MG 24 hr capsule [id number] Yes TAKE 1 CAPSULE(150 MG) BY MOUTH DAILY WITH BREAKFAST Bacigalupo, Dionne Bucy, MD Taking Active             Patient Active Problem List   Diagnosis Date Noted   Essential (hemorrhagic) thrombocythemia ([place]) 01/20/2020   Anxiety 07/08/2017   Diabetes mellitus ([place]) 07/08/2017   Polyp of sigmoid colon    Iron deficiency anemia due to chronic blood loss 04/15/2017   Anemia 12/26/2013   Barrett's esophagus 12/01/2013   Personal history of colonic polyps 10/19/2013   OSA on CPAP 09/20/2013    GERD (gastroesophageal reflux disease) 02/28/2013   Coronary artery disease involving coronary bypass graft of native heart with angina pectoris ([place]) 08/30/2012   Depression with anxiety 05/31/2012   Hypertension associated with diabetes ([place])    Hyperlipidemia associated with type 2 diabetes mellitus ([place])    History of tobacco abuse 01/06/2012   S/P CABG x 5 01/05/2012   Cardiovascular stress test abnormal 01/02/2012   CAD at cath, Nl LVF, severe multivessel CAD surgical consult for CABG 01/02/2012    Immunization History  Administered Date(s) Administered   Hepatitis B, adult 06/18/2015, 10/05/2015   PFIZER(Purple Top)SARS-COV-2 Vaccination 12/15/2019, 01/05/2020, 08/01/2020   Pneumococcal Conjugate-13 08/14/2014   Pneumococcal Polysaccharide-23 06/18/2015   Pneumococcal-Unspecified 09/15/2004   Td 09/16/2007   Tdap 04/04/2019    Conditions to be addressed/monitored:  Hypertension, Hyperlipidemia, Diabetes, Coronary Artery Disease, GERD, Depression, and Anxiety  There are no care plans that you recently modified to display for this patient.    Medication Assistance: Application for Ozempic  medication assistance program. in process.  Anticipated assistance start date TBD.  See plan of care for additional detail.  Compliance/Adherence/Medication fill history: Care Gaps: Shingrix Vaccine COVID-19 Vaccine (4-Booster for Pifzer) Influenza Vaccine Hemoglobin A1C  Star-Rating Drugs: Januvia 100 mg last filled 06/13/2021 for 90 day supply at [facility]. Glipizide 10 mg last filled 05/03/2021 for 90 day supply at [facility]. Atorvastatin 40 mg last filled 04/03/2021 for 90 day supply at [facility]. Metformin 1000 mg last filled 04/03/2021 for 90 day supply at [facility].  Patient's preferred pharmacy is:  [place], [place] - [address] [place] [address] Phone: [phone] Fax:  [facility] [phone] [address], [place] [address] [place] [address] Phone: [phone] Fax: [phone]  [facility] [id number] -  [place], [place] [address] Phone: [phone] Fax: [phone]  [id number] [place], [place] - [place] AT [facility] [address] [address] Phone: [phone] Fax: [phone]  Care Plan and Follow Up Patient Decision:  Patient does not qualify for CCM services. Pharmacy consulted for assistance with medication costs.  Plan: No further follow up required:  Patient does not qualify for CCM services. Pharmacy consulted for assistance with medication costs.  Junius Argyle, PharmD, [place], CPP  Clinical Pharmacist [facility] [address] [phone]  Current Barriers:  Unable to independently afford treatment regimen  Pharmacist Clinical Goal(s):  Patient will verbalize ability to afford treatment regimen through collaboration with PharmD and provider.   Interventions: 1:1 collaboration with Lavon Paganini MD  regarding development and update of comprehensive plan of care as evidenced by provider attestation and co-signature Inter-disciplinary care team collaboration (see longitudinal plan of care) Comprehensive medication review performed; medication list updated in electronic medical record  Diabetes (A1c goal <7%) -Uncontrolled -Current medications: Farxiga 10 mg daily  Glipizide XL 10 mg daily  Metformin 1000 mg twice daily  Januvia 100 mg daily  -Medications previously tried: Ozempic, never picked up due to cost  -Patient amenable to starting Ozempic if affordable. Was able to pick up a 30-day supply of Januvia but not affordable even with pharmacy voucher.  -Will start PAP for Ozempic. Mailed to patient on 06/18/21.   Patient Goals/Self-Care Activities Patient will:   - take medications as prescribed  Follow Up Plan: No further follow up required:  Patient does not qualify for CCM services. Pharmacy consulted for assistance with medication costs.

## 2021-07-05 MED ORDER — OZEMPIC (0.25 OR 0.5 MG/DOSE) 2 MG/1.5ML ~~LOC~~ SOPN: 0.5000 mg | PEN_INJECTOR | SUBCUTANEOUS | Status: DC

## 2021-07-05 NOTE — Addendum Note (Signed)
Addended by: Daron Offer A on: 07/05/2021 11:38 AM   Modules accepted: Orders

## 2021-07-05 NOTE — Progress Notes (Signed)
[place] Patient Assistance form for Ozempic completed by patient and faxed for review on 07/05/21

## 2021-07-09 ENCOUNTER — Other Ambulatory Visit: Payer: Self-pay | Admitting: Family Medicine

## 2021-07-09 DIAGNOSIS — E7800 Pure hypercholesterolemia, unspecified: Secondary | ICD-10-CM

## 2021-07-09 NOTE — Telephone Encounter (Signed)
Requested Prescriptions  Pending Prescriptions Disp Refills  . atorvastatin (LIPITOR) 40 MG tablet [Pharmacy Med Name: ATORVASTATIN 40MG  TABLETS] 90 tablet 2    Sig: TAKE 1 TABLET(40 MG) BY MOUTH DAILY     Cardiovascular:  Antilipid - Statins Failed - 07/09/2021  4:53 PM      Failed - HDL in normal range and within 360 days    HDL  Date Value Ref Range Status  02/01/2021 35 (L) >39 mg/dL Final         Failed - Triglycerides in normal range and within 360 days    Triglycerides  Date Value Ref Range Status  02/01/2021 344 (H) 0 - 149 mg/dL Final         Passed - Total Cholesterol in normal range and within 360 days    Cholesterol, Total  Date Value Ref Range Status  02/01/2021 151 100 - 199 mg/dL Final         Passed - LDL in normal range and within 360 days    LDL Chol Calc (NIH)  Date Value Ref Range Status  02/01/2021 62 0 - 99 mg/dL Final         Passed - Patient is not pregnant      Passed - Valid encounter within last 12 months    Recent Outpatient Visits          2 months ago Hypertension associated with diabetes [facility])   [place], Dionne Bucy, MD   5 months ago Hypertension associated with diabetes [facility])   [place], Dionne Bucy, MD   8 months ago Primary hypertension   [facility] Fenton Malling M, [place]   11 months ago Type 2 diabetes mellitus without complication, without long-term current use of insulin [facility])   [facility], Clearnce Sorrel, [place]   1 year ago Annual physical exam   [facility], Clearnce Sorrel, [place]      Future Appointments            In 4 weeks Gwyneth Sprout, [place], [facility]

## 2021-07-15 DIAGNOSIS — Z20822 Contact with and (suspected) exposure to covid-19: Secondary | ICD-10-CM | POA: Diagnosis not present

## 2021-07-19 ENCOUNTER — Other Ambulatory Visit: Payer: Self-pay | Admitting: Physician Assistant

## 2021-07-19 DIAGNOSIS — E119 Type 2 diabetes mellitus without complications: Secondary | ICD-10-CM

## 2021-07-19 MED ORDER — METFORMIN HCL 1000 MG PO TABS: ORAL_TABLET | ORAL | 1 refills | Status: DC

## 2021-07-19 NOTE — Telephone Encounter (Signed)
[facility] faxed refill request for the following medications:   metFORMIN (GLUCOPHAGE) 1000 MG tablet     Please advise.  

## 2021-07-19 NOTE — Telephone Encounter (Signed)
Please advise refill for Januvia?  LOV: 05/06/2021 NOV: 08/07/21 Last refill: Medication is listed as historical provider. Is this ok to fill?

## 2021-07-22 ENCOUNTER — Other Ambulatory Visit: Payer: Self-pay | Admitting: Physician Assistant

## 2021-07-23 ENCOUNTER — Telehealth: Payer: Self-pay | Admitting: Family Medicine

## 2021-07-23 NOTE — Telephone Encounter (Signed)
Pt went to get his JANUVIA 100 MG tablet and it was $248.  Previously had been $98.  Pt is requesting an alternative to this medication that is affordable and will work.   Pt had met with Cristie Hem to drive and get some help with this med. Please advise.  [place], [place]

## 2021-07-23 NOTE — Telephone Encounter (Signed)
We are out of samples in office , please review. KW

## 2021-07-25 NOTE — Telephone Encounter (Signed)
Patient would like alternative of medication or equivalent to what he was on. KW

## 2021-07-26 ENCOUNTER — Other Ambulatory Visit: Payer: Self-pay | Admitting: Family Medicine

## 2021-07-26 DIAGNOSIS — E1159 Type 2 diabetes mellitus with other circulatory complications: Secondary | ICD-10-CM

## 2021-07-26 NOTE — Telephone Encounter (Signed)
Spoke with patient on the phone who states that currently he is taking Metformin, Farxiga and Glipizide to control his blood sugar. Patient states that he stopped Januvia this week after finding out from pharmacist that cost is now raised to $248 a month. Patient states that you guys had explored looking into Ozempic but states that pharmacist advised him cost would be $600 a month. Patient states that he dosent know what to do at this point and states that [facility] the pharmacist had advised him that he could not find Januvia at a different price after speaking with  manufacture. KW

## 2021-07-29 NOTE — Telephone Encounter (Signed)
Patient advised and at this time declined referral to lifestyle center. Joseph Terry

## 2021-08-05 ENCOUNTER — Other Ambulatory Visit: Payer: Self-pay | Admitting: Family Medicine

## 2021-08-05 DIAGNOSIS — F418 Other specified anxiety disorders: Secondary | ICD-10-CM

## 2021-08-05 NOTE — Telephone Encounter (Signed)
Pt called to let Daneil Dan know he needed the refill for clonazePAM (KLONOPIN) 0.5 MG tablet / he didn't want to go through holidays with this delayed / there was also a request sent to Dr. B by mistake / FYI/ please advise

## 2021-08-06 NOTE — Telephone Encounter (Signed)
Requested medications are due for refill today yes  Requested medications are on the active medication list yes  Last refill 07/09/21  Last visit 05/06/21  Future visit scheduled tomorrow 08/07/21  Notes to clinic not delegated, please assess. Requested Prescriptions  Pending Prescriptions Disp Refills   clonazePAM (KLONOPIN) 0.5 MG tablet [Pharmacy Med Name: CLONAZEPAM 0.5MG  TABLETS] 30 tablet     Sig: TAKE 1 TABLET(0.5 MG) BY MOUTH DAILY     Not Delegated - Psychiatry:  Anxiolytics/Hypnotics Failed - 08/05/2021  4:20 PM      Failed - This refill cannot be delegated      Failed - Urine Drug Screen completed in last 360 days      Passed - Valid encounter within last 6 months    Recent Outpatient Visits           3 months ago Hypertension associated with diabetes [facility])   [facility] [place], Dionne Bucy, MD   6 months ago Hypertension associated with diabetes [facility])   [facility], Dionne Bucy, MD   9 months ago Primary hypertension   [place], [place], [place]   1 year ago Type 2 diabetes mellitus without complication, without long-term current use of insulin [facility])   [facility] [place], Clearnce Sorrel, [place]   1 year ago Annual physical exam   [place], Clearnce Sorrel, [place]       Future Appointments             Tomorrow Gwyneth Sprout, [place], [place]

## 2021-08-07 ENCOUNTER — Encounter: Payer: Self-pay | Admitting: Family Medicine

## 2021-08-07 ENCOUNTER — Other Ambulatory Visit: Payer: Self-pay

## 2021-08-07 ENCOUNTER — Ambulatory Visit (INDEPENDENT_AMBULATORY_CARE_PROVIDER_SITE_OTHER): Payer: BC Managed Care – PPO | Admitting: Family Medicine

## 2021-08-07 VITALS — BP 143/81 | HR 93 | Resp 16 | Ht 70.0 in | Wt 169.8 lb

## 2021-08-07 DIAGNOSIS — E1159 Type 2 diabetes mellitus with other circulatory complications: Secondary | ICD-10-CM

## 2021-08-07 DIAGNOSIS — F418 Other specified anxiety disorders: Secondary | ICD-10-CM | POA: Diagnosis not present

## 2021-08-07 DIAGNOSIS — E1169 Type 2 diabetes mellitus with other specified complication: Secondary | ICD-10-CM | POA: Diagnosis not present

## 2021-08-07 DIAGNOSIS — K2270 Barrett's esophagus without dysplasia: Secondary | ICD-10-CM

## 2021-08-07 DIAGNOSIS — K5904 Chronic idiopathic constipation: Secondary | ICD-10-CM | POA: Diagnosis not present

## 2021-08-07 DIAGNOSIS — N401 Enlarged prostate with lower urinary tract symptoms: Secondary | ICD-10-CM

## 2021-08-07 DIAGNOSIS — E119 Type 2 diabetes mellitus without complications: Secondary | ICD-10-CM

## 2021-08-07 DIAGNOSIS — Z0000 Encounter for general adult medical examination without abnormal findings: Secondary | ICD-10-CM | POA: Diagnosis not present

## 2021-08-07 DIAGNOSIS — I10 Essential (primary) hypertension: Secondary | ICD-10-CM

## 2021-08-07 DIAGNOSIS — R351 Nocturia: Secondary | ICD-10-CM

## 2021-08-07 DIAGNOSIS — Z125 Encounter for screening for malignant neoplasm of prostate: Secondary | ICD-10-CM | POA: Diagnosis not present

## 2021-08-07 DIAGNOSIS — F17201 Nicotine dependence, unspecified, in remission: Secondary | ICD-10-CM

## 2021-08-07 DIAGNOSIS — E786 Lipoprotein deficiency: Secondary | ICD-10-CM

## 2021-08-07 DIAGNOSIS — R739 Hyperglycemia, unspecified: Secondary | ICD-10-CM

## 2021-08-07 DIAGNOSIS — E785 Hyperlipidemia, unspecified: Secondary | ICD-10-CM | POA: Diagnosis not present

## 2021-08-07 DIAGNOSIS — R948 Abnormal results of function studies of other organs and systems: Secondary | ICD-10-CM | POA: Diagnosis not present

## 2021-08-07 MED ORDER — METOPROLOL TARTRATE 25 MG PO TABS: ORAL_TABLET | ORAL | 3 refills | Status: DC

## 2021-08-07 MED ORDER — TAMSULOSIN HCL 0.4 MG PO CAPS: 0.4000 mg | ORAL_CAPSULE | Freq: Every day | ORAL | 3 refills | Status: DC

## 2021-08-07 MED ORDER — DAPAGLIFLOZIN PROPANEDIOL 10 MG PO TABS: 10.0000 mg | ORAL_TABLET | Freq: Every day | ORAL | 3 refills | Status: DC

## 2021-08-07 MED ORDER — GLIPIZIDE ER 10 MG PO TB24: 10.0000 mg | ORAL_TABLET | Freq: Every day | ORAL | 3 refills | Status: DC

## 2021-08-07 MED ORDER — OMEPRAZOLE 40 MG PO CPDR: DELAYED_RELEASE_CAPSULE | ORAL | 3 refills | Status: DC

## 2021-08-07 MED ORDER — POLYETHYLENE GLYCOL 3350 17 GM/SCOOP PO POWD: 1.0000 | Freq: Once | ORAL | 0 refills | Status: AC

## 2021-08-07 MED ORDER — VENLAFAXINE HCL ER 150 MG PO CP24: 150.0000 mg | ORAL_CAPSULE | Freq: Every day | ORAL | 3 refills | Status: DC

## 2021-08-07 NOTE — Assessment & Plan Note (Signed)
Lab work for [employer]

## 2021-08-07 NOTE — Assessment & Plan Note (Signed)
Repeat lipid panel ?

## 2021-08-07 NOTE — Assessment & Plan Note (Signed)
Recommend use of prn miralax

## 2021-08-07 NOTE — Assessment & Plan Note (Signed)
Stressors related to work

## 2021-08-07 NOTE — Assessment & Plan Note (Signed)
Trial of flomax

## 2021-08-07 NOTE — Assessment & Plan Note (Signed)
PSA screening

## 2021-08-07 NOTE — Assessment & Plan Note (Signed)
Chronic, stable 

## 2021-08-07 NOTE — Assessment & Plan Note (Signed)
Repeat chemistry

## 2021-08-07 NOTE — Assessment & Plan Note (Signed)
UTD on dental and eye exams Things to do to keep yourself healthy  - Exercise at least 30-45 minutes a day, 3-4 days a week.  - Eat a low-fat diet with lots of fruits and vegetables, up to 7-9 servings per day.  - Seatbelts can save your life. Wear them always.  - Smoke detectors on every level of your home, check batteries every year.  - Eye Doctor - have an eye exam every 1-2 years  - Safe sex - if you may be exposed to STDs, use a condom.  - Alcohol -  If you drink, do it moderately, less than 2 drinks per day.  - [place]. Choose someone to speak for you if you are not able.  - Depression is common in our stressful world.If you're feeling down or losing interest in things you normally enjoy, please come in for a visit.  - Violence - If anyone is threatening or hurting you, please call immediately.

## 2021-08-07 NOTE — Assessment & Plan Note (Signed)
Has not taken his metop for >10 due to lack of supply Reinforced need to let PCP know that he needs a refill

## 2021-08-07 NOTE — Progress Notes (Signed)
Complete physical exam   Patient: Joseph Terry   DOB: [birth year]   60 y.o. Male  MRN: [id number] Visit Date: 08/07/2021  Today's healthcare provider: Gwyneth Sprout, FNP   Chief Complaint  Patient presents with   Annual Exam   Subjective     HPI  Joseph Terry is a 60 y.o. male who presents today for a complete physical exam.  He reports consuming a general diet. The patient does not participate in regular exercise at present. He generally feels fairly well. He reports sleeping poorly. He does not have additional problems to discuss today.   Past Medical History:  Diagnosis Date   Anxiety state, unspecified    Barrett's esophagus    CAD (coronary artery disease), severe multivessel CAD surgical consult for CABG    a. 12/2011 Cath: 3 vessel CAD with normal EF;  b. 12/2011 CABG x 5: LIMA to LAD, SVG to distal RCA with sequential SVG to PDA, SVG to OM, and SVG to D2;  c. 03/2014 ETT: Ex time 6:21, 70mm horizontal ST dep in V3-V6, HTN response.   Depression    Diaphragmatic hernia without mention of obstruction or gangrene    Diverticulosis    GERD (gastroesophageal reflux disease)    Hyperlipidemia    Hypertension    Impotence of organic origin    Internal hemorrhoids without mention of complication    Iron deficiency anemia due to chronic blood loss 04/15/2017   Kidney stone    Motion sickness    cars, boats   OSA on CPAP    a. Sleep study 07/2013 +sever OSA; CPAP at 10cm recommended.   Personal history of colonic polyps    Type II diabetes mellitus ([place])    Wears contact lenses    Past Surgical History:  Procedure Laterality Date   CARDIAC CATHETERIZATION  4 /19/ 2013   [facility]    CHOLECYSTECTOMY  11/03/13   COLONOSCOPY  11/01/13   multiple polyps; diverticulosis. Sankar. Repeat 5 years.   COLONOSCOPY W/ POLYPECTOMY  09/16/2007   colon polyps.  Repeat 5 years. Iftikhar.   COLONOSCOPY WITH PROPOFOL N/A 06/04/2017   Procedure: COLONOSCOPY WITH PROPOFOL;   Surgeon: [name], MD;  Location: [place];  Service: Gastroenterology;  Laterality: N/A;  needs to remain first patient stated he also suppose to get EGD also   CORONARY ARTERY BYPASS GRAFT  01/05/2012   Procedure: CORONARY ARTERY BYPASS GRAFTING (CABG);  Surgeon: Rexene Alberts, MD;  Location: [place];  Service: Open Heart Surgery;  Laterality: N/A;  coronary artery bypass graft times five using left internal mammary artery and right leg greater saphenous vein harvested endoscopically    ESOPHAGOGASTRODUODENOSCOPY N/A 06/04/2017   PT NEEDS REPEAT IN 06/2020/BARRETT'S ESOPHAGUS - Surgeon: [name], MD   GIVENS CAPSULE STUDY N/A 06/22/2017   Procedure: GIVENS CAPSULE STUDY;  Surgeon: [name], MD;  Location: [facility] ENDOSCOPY;  Service: Endoscopy;  Laterality: N/A;   LEFT HEART CATHETERIZATION WITH CORONARY ANGIOGRAM N/A 01/02/2012   Procedure: LEFT HEART CATHETERIZATION WITH CORONARY ANGIOGRAM;  Surgeon: Troy Sine, MD;  Location: [facility] CATH LAB;  Service: Cardiovascular;  Laterality: N/A;   POLYPECTOMY N/A 06/04/2017   Procedure: POLYPECTOMY;  Surgeon: [name], MD;  Location: [place];  Service: Gastroenterology;  Laterality: N/A;   POLYSOMNOGRAPHY  07/2013   +severe OSA; CPAP at 10cm pressure recommended.   rotator cuff curgery  2011   lt rotator cuff   Shoulder surgery  1997   bilateral   WISDOM TOOTH EXTRACTION     Social History   Socioeconomic History   Marital status: Divorced    Spouse name: Not on file   Number of children: 1   Years of education: 12   Highest education level: Not on file  Occupational History   Occupation: Printmaker: [employer]    Comment: x 30 yrs  Tobacco Use   Smoking status: Former    Packs/day: 1.50    Years: 35.00    Pack years: 52.50    Types: Cigarettes    Quit date: 11/14/2011    Years since quitting: 9.7   Smokeless tobacco: Never   Tobacco comments:    quit 2 weeks prior to CABG 12/2011   Vaping Use   Vaping Use: Never used  Substance and Sexual Activity   Alcohol use: Yes    Alcohol/week: 1.0 standard drink    Types: 1 Cans of beer per week    Comment: Drinks once per month; beer.   Drug use: No   Sexual activity: Yes    Partners: Female    Birth control/protection: Condom  Other Topics Concern   Not on file  Social History Narrative   Marital status: divorced since 1996; dating casually in 2016.        Lives: Lives alone.          Children:  One son and a granddaughter 7 years old live in [place].       Tobacco: quit with CABG      Alcohol: weekends; 6-8 beers on average.  Beers with football; ten on football weekends.      Exercise: Light, yard work.        Employment:  Works 45-50 hrs. Some weeks.  [employer].  Desk work since 05/2013. Since 1979.      Guns:  Guns in the home not stored in locked cabinet. Smoke alarm in home, Always wears seatbelts.       Sexual History: active;Last HIV testing 2015; has has 25+ partners; females only.            Social Determinants of Health   Financial Resource Strain: Not on file  Food Insecurity: Not on file  Transportation Needs: Not on file  Physical Activity: Not on file  Stress: Not on file  Social Connections: Not on file  Intimate Partner Violence: Not on file   Family Status  Relation Name Status   Mother  Deceased at age 47       lung cancer; Defibrillator; age 24.   Father  Deceased at age 89       COPD   Sister  Alive   MGM  Deceased   MGF  Deceased   PGM  Deceased   PGF  Deceased   Sister  Alive   Sister  Alive   Brother  Alive   Brother  Alive   Brother  Alive   Son  Alive   Family History  Problem Relation Age of Onset   Heart attack Mother        defribillator in place   Hypertension Mother    Hyperlipidemia Mother    Heart disease Mother        CHF with defibrillator;  CAD.   Diabetes Mother    Cancer Mother 76       Lung cancer   Hypertension Father    Hyperlipidemia  Father    Cancer Father  69        prostate cancer s/p  implants   COPD Father    Hypertension Sister    Heart disease Maternal Grandmother    Heart disease Maternal Grandfather    Cancer Paternal Grandmother    Cancer Paternal Grandfather    Hypertension Sister    Allergies  Allergen Reactions   Tramadol Rash    Patient Care Team: Gwyneth Sprout, FNP as PCP - General (Family Medicine) Terance Ice, MD (Inactive) as Consulting Physician (Cardiology) [name], MD as Consulting Physician (Family Medicine) Christene Lye, MD (General Surgery) Cristy Friedlander, MD (Oral Surgery) Germaine Pomfret, [facility] (Pharmacist)   Medications: Outpatient Medications Prior to Visit  Medication Sig   atorvastatin (LIPITOR) 40 MG tablet TAKE 1 TABLET(40 MG) BY MOUTH DAILY   clonazePAM (KLONOPIN) 0.5 MG tablet TAKE 1 TABLET(0.5 MG) BY MOUTH DAILY   ferrous sulfate 325 (65 FE) MG EC tablet Take 1 tablet (325 mg total) by mouth 3 (three) times daily with meals.   metFORMIN (GLUCOPHAGE) 1000 MG tablet TAKE 1 TABLET(1000 MG) BY MOUTH TWICE DAILY WITH A MEAL   sildenafil (REVATIO) 20 MG tablet 3-5 tablets 1 hour prior to intercourse   [DISCONTINUED] FARXIGA 10 MG TABS tablet TAKE 1 TABLET(10 MG) BY MOUTH DAILY   [DISCONTINUED] glipiZIDE (GLUCOTROL XL) 10 MG 24 hr tablet TAKE 1 TABLET(10 MG) BY MOUTH DAILY WITH BREAKFAST   [DISCONTINUED] metoprolol tartrate (LOPRESSOR) 25 MG tablet TAKE 1 TABLET(25 MG) BY MOUTH TWICE DAILY   [DISCONTINUED] omeprazole (PRILOSEC) 40 MG capsule TAKE 1 CAPSULE(40 MG) BY MOUTH DAILY   [DISCONTINUED] venlafaxine XR (EFFEXOR-XR) 150 MG 24 hr capsule TAKE 1 CAPSULE(150 MG) BY MOUTH DAILY WITH BREAKFAST   [DISCONTINUED] JANUVIA 100 MG tablet TAKE 1 TABLET BY MOUTH DAILY   No facility-administered medications prior to visit.    Review of Systems  All other systems reviewed and are negative.    Objective    BP (!) 143/81   Pulse 93   Resp 16   Ht 5'  10" (1.778 m)   Wt 169 lb 12.8 oz (77 kg)   SpO2 97%   BMI 24.36 kg/m    Physical Exam Vitals and nursing note reviewed.  Constitutional:      General: He is awake. He is not in acute distress.    Appearance: Normal appearance. He is well-developed, well-groomed and normal weight. He is not ill-appearing, toxic-appearing or diaphoretic.  HENT:     Head: Normocephalic and atraumatic.     Jaw: There is normal jaw occlusion. No trismus, tenderness, swelling or pain on movement.     Salivary Glands: Right salivary gland is not diffusely enlarged or tender. Left salivary gland is not diffusely enlarged or tender.     Right Ear: Hearing, tympanic membrane, ear canal and external ear normal. There is no impacted cerumen.     Left Ear: Hearing, tympanic membrane, ear canal and external ear normal. There is no impacted cerumen.     Nose: Nose normal. No congestion or rhinorrhea.     Right Turbinates: Not enlarged, swollen or pale.     Left Turbinates: Not enlarged, swollen or pale.     Right Sinus: No maxillary sinus tenderness or frontal sinus tenderness.     Left Sinus: No maxillary sinus tenderness or frontal sinus tenderness.     Mouth/Throat:     Lips: Pink.     Mouth: Mucous membranes are moist. No injury, lacerations, oral lesions  or angioedema.     Pharynx: Oropharynx is clear. Uvula midline. No pharyngeal swelling, oropharyngeal exudate or posterior oropharyngeal erythema.     Tonsils: No tonsillar exudate or tonsillar abscesses.  Eyes:     General: Lids are normal. Vision grossly intact. Gaze aligned appropriately.        Right eye: No discharge.        Left eye: No discharge.     Extraocular Movements: Extraocular movements intact.     Conjunctiva/sclera: Conjunctivae normal.     Pupils: Pupils are equal, round, and reactive to light.  Neck:     Thyroid: No thyroid mass, thyromegaly or thyroid tenderness.     Vascular: No carotid bruit.     Trachea: Trachea normal. No tracheal  tenderness.  Cardiovascular:     Rate and Rhythm: Normal rate and regular rhythm.     Pulses: Normal pulses.          Carotid pulses are 2+ on the right side and 2+ on the left side.      Radial pulses are 2+ on the right side and 2+ on the left side.       Femoral pulses are 2+ on the right side and 2+ on the left side.      Popliteal pulses are 2+ on the right side and 2+ on the left side.       Dorsalis pedis pulses are 2+ on the right side and 2+ on the left side.       Posterior tibial pulses are 2+ on the right side and 2+ on the left side.     Heart sounds: Normal heart sounds, S1 normal and S2 normal. No murmur heard.   No friction rub. No gallop.  Pulmonary:     Effort: Pulmonary effort is normal. No respiratory distress.     Breath sounds: Normal breath sounds and air entry. No stridor. No wheezing, rhonchi or rales.  Chest:     Chest wall: No tenderness.  Abdominal:     General: Abdomen is flat. Bowel sounds are normal. There is no distension.     Palpations: Abdomen is soft. There is no mass.     Tenderness: There is no abdominal tenderness. There is no guarding or rebound.     Hernia: No hernia is present.  Genitourinary:    Comments: Exam deferred; denies complaints Musculoskeletal:        General: No swelling, tenderness, deformity or signs of injury. Normal range of motion.     Cervical back: Normal range of motion and neck supple. No rigidity or tenderness.     Right lower leg: No edema.     Left lower leg: No edema.  Lymphadenopathy:     Cervical: No cervical adenopathy.     Right cervical: No superficial, deep or posterior cervical adenopathy.    Left cervical: No superficial, deep or posterior cervical adenopathy.  Skin:    General: Skin is warm and dry.     Capillary Refill: Capillary refill takes less than 2 seconds.     Coloration: Skin is not jaundiced or pale.     Findings: No bruising, erythema, lesion or rash.  Neurological:     General: No focal  deficit present.     Mental Status: He is alert and oriented to person, place, and time. Mental status is at baseline.     GCS: GCS eye subscore is 4. GCS verbal subscore is 5. GCS motor subscore is 6.  Sensory: Sensation is intact. No sensory deficit.     Motor: Motor function is intact. No weakness.     Coordination: Coordination is intact.     Gait: Gait is intact.  Psychiatric:        Attention and Perception: Attention and perception normal.        Mood and Affect: Mood and affect normal.        Speech: Speech normal.        Behavior: Behavior normal. Behavior is cooperative.        Thought Content: Thought content normal.        Cognition and Memory: Cognition normal.        Judgment: Judgment normal.     Last depression screening scores PHQ 2/9 Scores 08/07/2021 05/06/2021 02/01/2021  PHQ - 2 Score 2 0 2  PHQ- 9 Score 5 1 8    Last fall risk screening Fall Risk  05/06/2021  Falls in the past year? 0  Number falls in past yr: 0  Injury with Fall? 0  Risk for fall due to : No Fall Risks  Follow up -   Last Audit-C alcohol use screening Alcohol Use Disorder Test (AUDIT) 08/07/2021  1. How often do you have a drink containing alcohol? 1  2. How many drinks containing alcohol do you have on a typical day when you are drinking? 1  3. How often do you have six or more drinks on one occasion? 1  AUDIT-C Score 3  4. How often during the last year have you found that you were not able to stop drinking once you had started? -  5. How often during the last year have you failed to do what was normally expected from you because of drinking? -  6. How often during the last year have you needed a first drink in the morning to get yourself going after a heavy drinking session? -  7. How often during the last year have you had a feeling of guilt of remorse after drinking? -  8. How often during the last year have you been unable to remember what happened the night before because you had  been drinking? -  9. Have you or someone else been injured as a result of your drinking? -  10. Has a relative or friend or a doctor or another health worker been concerned about your drinking or suggested you cut down? -  Alcohol Use Disorder Identification Test Final Score (AUDIT) -  Alcohol Brief Interventions/Follow-up -   A score of 3 or more in women, and 4 or more in men indicates increased risk for alcohol abuse, EXCEPT if all of the points are from question 1   No results found for any visits on 08/07/21.  Assessment & Plan    Routine Health Maintenance and Physical Exam  Exercise Activities and Dietary recommendations  Goals   None     Immunization History  Administered Date(s) Administered   Hepatitis B, adult 06/18/2015, 10/05/2015   PFIZER(Purple Top)SARS-COV-2 Vaccination 12/15/2019, 01/05/2020, 08/01/2020   Pneumococcal Conjugate-13 08/14/2014   Pneumococcal Polysaccharide-23 06/18/2015   Pneumococcal-Unspecified 09/15/2004   Td 09/16/2007   Tdap 04/04/2019    Health Maintenance  Topic Date Due   Zoster Vaccines- Shingrix (1 of 2) Never done   Pneumococcal Vaccine 73-40 Years old (3 - PPSV23 if available, else PCV20) 06/17/2020   COVID-19 Vaccine (4 - Booster for Pfizer series) 09/26/2020   INFLUENZA VACCINE  Never done   HEMOGLOBIN  A1C  05/04/2021   OPHTHALMOLOGY EXAM  12/24/2021   FOOT EXAM  02/01/2022   URINE MICROALBUMIN  05/06/2022   COLONOSCOPY (Pts 45-29yrs Insurance coverage will need to be confirmed)  06/04/2022   TETANUS/TDAP  04/03/2029   Hepatitis C Screening  Completed   HIV Screening  Completed   HPV VACCINES  Aged Out    Discussed health benefits of physical activity, and encouraged him to engage in regular exercise appropriate for his age and condition.  Problem List Items Addressed This Visit       Cardiovascular and Mediastinum   Essential hypertension    Has not taken his metop for >10 due to lack of supply Reinforced need to  let PCP know that he needs a refill       Relevant Medications   metoprolol tartrate (LOPRESSOR) 25 MG tablet     Digestive   Barrett's esophagus    Chronic, stable      Relevant Medications   omeprazole (PRILOSEC) 40 MG capsule   Chronic idiopathic constipation    Recommend use of prn miralax      Relevant Medications   polyethylene glycol powder (GLYCOLAX/MIRALAX) 17 GM/SCOOP powder     Endocrine   Diabetes mellitus ([place])    Lab work for a1c      Relevant Medications   dapagliflozin propanediol (FARXIGA) 10 MG TABS tablet   glipiZIDE (GLUCOTROL XL) 10 MG 24 hr tablet   Other Relevant Orders   Hemoglobin A1c     Other   Depression with anxiety    Stressors related to work      Relevant Medications   venlafaxine XR (EFFEXOR-XR) 150 MG 24 hr capsule   Low HDL (under 40)    Repeat lipid panel      Relevant Orders   Lipid panel   Elevated serum glucose    Repeat chemistry      Relevant Orders   Hemoglobin A1c   Comprehensive metabolic panel   BPH associated with nocturia    Trial of flomax      Relevant Medications   tamsulosin (FLOMAX) 0.4 MG CAPS capsule   Annual physical exam - Primary    UTD on dental and eye exams Things to do to keep yourself healthy  - Exercise at least 30-45 minutes a day, 3-4 days a week.  - Eat a low-fat diet with lots of fruits and vegetables, up to 7-9 servings per day.  - Seatbelts can save your life. Wear them always.  - Smoke detectors on every level of your home, check batteries every year.  - Eye Doctor - have an eye exam every 1-2 years  - Safe sex - if you may be exposed to STDs, use a condom.  - Alcohol -  If you drink, do it moderately, less than 2 drinks per day.  - [place]. Choose someone to speak for you if you are not able.  - Depression is common in our stressful world.If you're feeling down or losing interest in things you normally enjoy, please come in for a visit.  - Violence - If  anyone is threatening or hurting you, please call immediately.        Prostate cancer screening    PSA screening      Relevant Orders   PSA   Tobacco abuse, in remission    Needs low dose CT screening      Relevant Orders   CT CHEST LUNG CA SCREEN LOW  DOSE W/O CM     Return in about 3 months (around 11/07/2021) for T2DM management.     Vonna Kotyk, FNP, have reviewed all documentation for this visit. The documentation on 08/07/21 for the exam, diagnosis, procedures, and orders are all accurate and complete.    Gwyneth Sprout, [place] [phone] (phone) [phone] (fax)  [place]

## 2021-08-07 NOTE — Assessment & Plan Note (Signed)
Needs low dose CT screening

## 2021-08-08 LAB — COMPREHENSIVE METABOLIC PANEL
ALT: 46 IU/L — ABNORMAL HIGH (ref 0–44)
AST: 24 IU/L (ref 0–40)
Albumin/Globulin Ratio: 2 (ref 1.2–2.2)
Albumin: 4.7 g/dL (ref 3.8–4.8)
Alkaline Phosphatase: 106 IU/L (ref 44–121)
BUN/Creatinine Ratio: 13 (ref 10–24)
BUN: 9 mg/dL (ref 8–27)
Bilirubin Total: 0.2 mg/dL (ref 0.0–1.2)
CO2: 26 mmol/L (ref 20–29)
Calcium: 9.7 mg/dL (ref 8.6–10.2)
Chloride: 97 mmol/L (ref 96–106)
Creatinine, Ser: 0.68 mg/dL — ABNORMAL LOW (ref 0.76–1.27)
Globulin, Total: 2.4 g/dL (ref 1.5–4.5)
Glucose: 219 mg/dL — ABNORMAL HIGH (ref 70–99)
Potassium: 4.9 mmol/L (ref 3.5–5.2)
Sodium: 137 mmol/L (ref 134–144)
Total Protein: 7.1 g/dL (ref 6.0–8.5)
eGFR: 105 mL/min/{1.73_m2} (ref >59)

## 2021-08-08 LAB — LIPID PANEL
Chol/HDL Ratio: 4.9 ratio (ref 0.0–5.0)
Cholesterol, Total: 146 mg/dL (ref 100–199)
HDL: 30 mg/dL — ABNORMAL LOW (ref >39)
LDL Chol Calc (NIH): 38 mg/dL (ref 0–99)
Triglycerides: 550 mg/dL — ABNORMAL HIGH (ref 0–149)
VLDL Cholesterol Cal: 78 mg/dL — ABNORMAL HIGH (ref 5–40)

## 2021-08-08 LAB — PSA: Prostate Specific Ag, Serum: 0.5 ng/mL (ref 0.0–4.0)

## 2021-08-08 LAB — HEMOGLOBIN A1C
Est. average glucose Bld gHb Est-mCnc: 177 mg/dL
Hgb A1c MFr Bld: 7.8 % — ABNORMAL HIGH (ref 4.8–5.6)

## 2021-08-10 ENCOUNTER — Other Ambulatory Visit: Payer: Self-pay | Admitting: Family Medicine

## 2021-08-10 MED ORDER — FENOFIBRATE 145 MG PO TABS: 145.0000 mg | ORAL_TABLET | Freq: Every day | ORAL | 3 refills | Status: DC

## 2021-08-10 MED ORDER — ROSUVASTATIN CALCIUM 20 MG PO TABS: 20.0000 mg | ORAL_TABLET | Freq: Every day | ORAL | 3 refills | Status: DC

## 2021-09-04 ENCOUNTER — Other Ambulatory Visit: Payer: Self-pay | Admitting: Family Medicine

## 2021-09-04 DIAGNOSIS — F418 Other specified anxiety disorders: Secondary | ICD-10-CM

## 2021-09-04 NOTE — Telephone Encounter (Signed)
Requested medication (s) are due for refill today: Yes  Requested medication (s) are on the active medication list: Yes  Last refill:  08/06/21  Future visit scheduled: No  Notes to clinic:  See request.    Requested Prescriptions  Pending Prescriptions Disp Refills   clonazePAM (KLONOPIN) 0.5 MG tablet [Pharmacy Med Name: CLONAZEPAM 0.5MG  TABLETS] 30 tablet     Sig: TAKE 1 TABLET(0.5 MG) BY MOUTH DAILY     Not Delegated - Psychiatry:  Anxiolytics/Hypnotics Failed - 09/04/2021  9:16 AM      Failed - This refill cannot be delegated      Failed - Urine Drug Screen completed in last 360 days      Passed - Valid encounter within last 6 months    Recent Outpatient Visits           4 weeks ago Annual physical exam   [facility] Tally Joe T, FNP   4 months ago Hypertension associated with diabetes [facility])   [place], Dionne Bucy, MD   7 months ago Hypertension associated with diabetes [facility])   [facility], Dionne Bucy, MD   10 months ago Primary hypertension   [place], [place], [place]   1 year ago Type 2 diabetes mellitus without complication, without long-term current use of insulin [facility])   [place], [place], [place]

## 2021-09-06 NOTE — Telephone Encounter (Signed)
Ria Comment can you review prescription below, Joseph Terry is out of office and patient is stating he needs medication today.KW

## 2021-09-06 NOTE — Telephone Encounter (Signed)
Pt following up on this request for clonazePAM (KLONOPIN) 0.5 MG tablet  Pt called pharmacy 3 days ago (counting today) Pt is upset, and needs today.  Pt would appreciate refills, as he says he has to call every month and go through this same thing.   [place], [place]

## 2021-09-06 NOTE — Telephone Encounter (Signed)
Refill covering for pcp, elise

## 2021-09-30 ENCOUNTER — Telehealth: Payer: Self-pay | Admitting: Acute Care

## 2021-09-30 NOTE — Telephone Encounter (Signed)
Contacted for LCS referral.  Patient states that he is not interested in lung cancer screening CT and would prefer we close the referral and no future contact is requested.  Routed this note to referring PCP for update.

## 2021-10-07 ENCOUNTER — Telehealth: Payer: Self-pay | Admitting: Physician Assistant

## 2021-10-07 ENCOUNTER — Other Ambulatory Visit: Payer: Self-pay | Admitting: Family Medicine

## 2021-10-07 DIAGNOSIS — F418 Other specified anxiety disorders: Secondary | ICD-10-CM

## 2021-10-07 MED ORDER — CLONAZEPAM 0.5 MG PO TABS: 0.5000 mg | ORAL_TABLET | Freq: Every day | ORAL | 0 refills | Status: DC

## 2021-10-07 NOTE — Telephone Encounter (Signed)
Patient states he is out of he's clonazePAM (KLONOPIN) 0.5 MG tablet and does not understand why he has to call in every month for a refill and would like an explanation.  Patient states the last physician filled it on time and when he is without he goes through withdrawals.     [facility] [place], [place] [facility] Phone:  [phone]  Fax:  [phone]

## 2021-10-07 NOTE — Telephone Encounter (Signed)
Pt is very upset that denied this med today.  Wants to know why he has to go through this everytime and tomorrow is withdrawal day. Looks like due to duplicate and offered additional by nurse to confirm but is extremely upset /offered to do a clinical to have a nurse talk to him but he does not want that either. He does want his script today ans starting withdrawals now. Stated no fu just needs a refill.

## 2021-10-07 NOTE — Telephone Encounter (Signed)
Patient wants a [employer] employee to call him back to let him know why he is only get #30 with no refills.

## 2021-11-04 ENCOUNTER — Other Ambulatory Visit: Payer: Self-pay | Admitting: Family Medicine

## 2021-11-04 DIAGNOSIS — F418 Other specified anxiety disorders: Secondary | ICD-10-CM

## 2021-11-05 NOTE — Telephone Encounter (Signed)
Requested medication (s) are due for refill today: Due 11/07/21  Requested medication (s) are on the active medication list: yes    Last refill: 10/07/21  #30  0 refills  Future visit scheduled no  Notes to clinic:not delegated  Requested Prescriptions  Pending Prescriptions Disp Refills   clonazePAM (KLONOPIN) 0.5 MG tablet [Pharmacy Med Name: CLONAZEPAM 0.5MG  TABLETS] 30 tablet     Sig: TAKE 1 TABLET(0.5 MG) BY MOUTH DAILY     Not Delegated - Psychiatry: Anxiolytics/Hypnotics 2 Failed - 11/04/2021 12:44 PM      Failed - This refill cannot be delegated      Failed - Urine Drug Screen completed in last 360 days      Passed - Patient is not pregnant      Passed - Valid encounter within last 6 months    Recent Outpatient Visits           3 months ago Annual physical exam   [facility] Tally Joe T, FNP   6 months ago Hypertension associated with diabetes [facility])   [place], Dionne Bucy, MD   9 months ago Hypertension associated with diabetes [facility])   [facility], Dionne Bucy, MD   1 year ago Primary hypertension   [place], [place], [place]   1 year ago Type 2 diabetes mellitus without complication, without long-term current use of insulin [facility])   [place], [place], [place]

## 2021-12-23 ENCOUNTER — Telehealth: Payer: Self-pay | Admitting: Family Medicine

## 2021-12-23 NOTE — Telephone Encounter (Signed)
Copied from [place] [id number]. Topic: General - Other ?>> Dec 23, 2021  9:58 AM Leward Quan A wrote: ?Reason for CRM: Patient called in to inquire of Tally Joe how he can get the dapagliflozin propanediol (FARXIGA) 10 MG TABS tablet since he does not have any insurance and can not afford to pay $362 for a 30 day supply. Patient trying to find a job with benefits and need an alternative please. Asking for a call back at Ph# [phone] ?

## 2021-12-24 ENCOUNTER — Other Ambulatory Visit: Payer: Self-pay | Admitting: Family Medicine

## 2021-12-24 ENCOUNTER — Encounter: Payer: Self-pay | Admitting: Oncology

## 2021-12-24 DIAGNOSIS — Z5989 Other problems related to housing and economic circumstances: Secondary | ICD-10-CM

## 2021-12-24 NOTE — Telephone Encounter (Signed)
We have 3 sample bottles left of farxiga '5mg'$ . KW ? ?

## 2021-12-24 NOTE — Telephone Encounter (Signed)
Left detailed message for patient advising him samples are left for p/u. KW ?

## 2021-12-27 ENCOUNTER — Telehealth: Payer: Self-pay

## 2021-12-27 ENCOUNTER — Telehealth: Payer: BC Managed Care – PPO

## 2021-12-27 NOTE — Telephone Encounter (Signed)
Copied from [place] [phone]. Topic: General - Other >> Dec 27, 2021 11:55 AM Tessa Lerner A wrote: Reason for CRM: The patient has missed a call from Junius Argyle and would like to speak with him again when possible   Please contact further when available

## 2021-12-27 NOTE — Progress Notes (Signed)
? ? ?  Chronic Care Management ?Pharmacy Assistant  ? ?Name: Joseph Terry  MRN: [id number] DOB: 18-Mar-1961 ? ?Reason for Encounter: Medication Review/Patient assistance application for [place]. ?  ?Medications: ?Outpatient Encounter Medications as of 12/27/2021  ?Medication Sig  ? clonazePAM (KLONOPIN) 0.5 MG tablet TAKE 1 TABLET(0.5 MG) BY MOUTH DAILY  ? dapagliflozin propanediol (FARXIGA) 10 MG TABS tablet Take 1 tablet (10 mg total) by mouth daily.  ? fenofibrate (TRICOR) 145 MG tablet Take 1 tablet (145 mg total) by mouth daily.  ? ferrous sulfate 325 (65 FE) MG EC tablet Take 1 tablet (325 mg total) by mouth 3 (three) times daily with meals.  ? glipiZIDE (GLUCOTROL XL) 10 MG 24 hr tablet Take 1 tablet (10 mg total) by mouth daily with breakfast.  ? metFORMIN (GLUCOPHAGE) 1000 MG tablet TAKE 1 TABLET(1000 MG) BY MOUTH TWICE DAILY WITH A MEAL  ? metoprolol tartrate (LOPRESSOR) 25 MG tablet TAKE 1 TABLET(25 MG) BY MOUTH TWICE DAILY  ? omeprazole (PRILOSEC) 40 MG capsule TAKE 1 CAPSULE(40 MG) BY MOUTH DAILY  ? rosuvastatin (CRESTOR) 20 MG tablet Take 1 tablet (20 mg total) by mouth daily.  ? sildenafil (REVATIO) 20 MG tablet 3-5 tablets 1 hour prior to intercourse  ? tamsulosin (FLOMAX) 0.4 MG CAPS capsule Take 1 capsule (0.4 mg total) by mouth daily.  ? venlafaxine XR (EFFEXOR-XR) 150 MG 24 hr capsule Take 1 capsule (150 mg total) by mouth daily with breakfast.  ? ?No facility-administered encounter medications on file as of 12/27/2021.  ? ? ?I received a task from Junius Argyle, CPP requesting that I start the application for patient assistance on the medication Farxiga.  ?  ?Spoke with the patient and he gave consent to complete his application online with me over the phone. ? ?Patient was denied due Income Criteria not met.Patient reports he does not have health insurance or a job at the moment. ? ?I reach out to [employer] to see if we can appeal the denial and what are the next steps we can take to help the patient  afford Farxiga.Per [employer] his income is pulled from last year and that's what they go by.Patient  has to have some kind of income to be eligible. Patient could show that he spends 10% of his income on medical expensive per month base of his income last year which is 72,000. ? ?Patient states he is unsure how to show or prove that he spends 10% of his income on medical expensive since he does not have any income at this time. I did inform him he could try to apply for Medicaid which he thought you had to be at least 63 years old.I inform him Medicare is the one that usually starts at 49 ,and Medicaid can be for any age. Patient was unaware of this, and States he will to apply for Medicaid.Patient ask is there another alternative medications he can take that is not 362 dollars for a 30 day supply?Patient reports  he does have samples that was provided by his PCP. Notified Clinical pharmacist of above. ? ?Anderson Malta ?Clinical Pharmacist Assistant ?[phone]  ? ? ?

## 2021-12-27 NOTE — Progress Notes (Deleted)
? ?Chronic Care Management ?Pharmacy Note ? ?12/27/2021 ?Name:  Joseph Terry MRN:  [id number] DOB:  [birth year] ? ?Subjective: ?Joseph Terry is an 61 y.o. year old male who is a primary patient of Gwyneth Sprout, FNP.  The CCM team was consulted for assistance with disease management and care coordination needs.   ? ?Engaged with patient by telephone for initial visit in response to provider referral for pharmacy case management and/or care coordination services.  ? ?Consent to Services:  ?Patient does not qualify for CCM services. Pharmacy consulted for assistance with medication costs. ? ?Patient Care Team: ?Gwyneth Sprout, FNP as PCP - General (Family Medicine) ?Terance Ice, MD (Inactive) as Consulting Physician (Cardiology) ?[name], MD as Consulting Physician (Family Medicine) ?Christene Lye, MD (General Surgery) ?Cristy Friedlander, MD (Oral Surgery) ?Germaine Pomfret, [facility] (Pharmacist) ? ?Recent office visits: ?05/06/2021 Lavon Paganini MD (PCP office) Discontinue Ozempic (this was ordered to replace Januvia, but it is less affordable), restart Januvia 100 mg , AMB Referral to [place] ?02/01/2021 Lavon Paganini MD (PCP office) start Ozempic 0.25 mg weekly, stop Januvia due to cost, reduce Klonopin 0.5 mg once daily, increase Effexor to 225 mg daily if needed , ? ?Recent consult visits: ?No recent Consult Visit  ? ?Hospital visits: ?None in previous 6 months ? ? ?Objective: ? ?Lab Results  ?Component Value Date  ? CREATININE 0.68 (L) 08/07/2021  ? BUN 9 08/07/2021  ? GFRNONAA 97 11/02/2020  ? GFRAA 113 11/02/2020  ? NA 137 08/07/2021  ? K 4.9 08/07/2021  ? CALCIUM 9.7 08/07/2021  ? CO2 26 08/07/2021  ? GLUCOSE 219 (H) 08/07/2021  ? ? ?Lab Results  ?Component Value Date/Time  ? HGBA1C 7.8 (H) 08/07/2021 03:25 PM  ? HGBA1C 6.7 (A) 02/01/2021 08:44 AM  ? HGBA1C 6.8 (H) 11/02/2020 08:59 AM  ? MICROALBUR Negative 04/23/2020 08:29 AM  ? MICROALBUR Negative  03/31/2018 09:29 AM  ?  ?Last diabetic Eye exam:  ?Lab Results  ?Component Value Date/Time  ? HMDIABEYEEXA No Retinopathy 12/24/2020 12:00 AM  ?  ?Last diabetic Foot exam: No results found for: HMDIABFOOTEX  ? ?Lab Results  ?Component Value Date  ? CHOL 146 08/07/2021  ? HDL 30 (L) 08/07/2021  ? Climax 38 08/07/2021  ? TRIG 550 (H) 08/07/2021  ? CHOLHDL 4.9 08/07/2021  ? ? ? ?  Latest Ref Rng & Units 08/07/2021  ?  3:25 PM 02/01/2021  ?  9:05 AM 04/23/2020  ?  8:52 AM  ?Hepatic Function  ?Total Protein 6.0 - 8.5 g/dL 7.1   7.5   7.1    ?Albumin 3.8 - 4.8 g/dL 4.7   5.0   4.7    ?AST 0 - 40 IU/L $Remov'24   18   22    'FTTBkV$ ?ALT 0 - 44 IU/L 46   36   30    ?Alk Phosphatase 44 - 121 IU/L 106   89   81    ?Total Bilirubin 0.0 - 1.2 mg/dL 0.2   0.3   0.4    ? ? ?Lab Results  ?Component Value Date/Time  ? TSH 1.760 04/23/2020 08:52 AM  ? TSH 2.890 04/05/2019 09:15 AM  ? ? ? ?  Latest Ref Rng & Units 02/01/2021  ?  9:05 AM 04/23/2020  ?  8:52 AM 04/05/2019  ?  9:15 AM  ?CBC  ?WBC 3.4 - 10.8 x10E3/uL 8.1   5.7   7.9    ?Hemoglobin  13.0 - 17.7 g/dL 15.8   14.5   15.0    ?Hematocrit 37.5 - 51.0 % 48.1   44.7   44.5    ?Platelets 150 - 450 x10E3/uL 317   236   256    ? ? ?Lab Results  ?Component Value Date/Time  ? VD25OH 39 02/28/2013 08:32 AM  ? VD25OH 37 05/31/2012 08:49 AM  ? ? ?Clinical ASCVD: Yes  ?The 10-year ASCVD risk score (Arnett DK, et al., 2019) is: 27.4% ?  Values used to calculate the score: ?    Age: 75 years ?    Sex: Male ?    Is Non-Hispanic African American: No ?    Diabetic: Yes ?    Tobacco smoker: No ?    Systolic Blood Pressure: 741 mmHg ?    Is BP treated: Yes ?    HDL Cholesterol: 30 mg/dL ?    Total Cholesterol: 146 mg/dL   ? ? ?  08/07/2021  ?  2:28 PM 05/06/2021  ?  3:15 PM 02/01/2021  ?  8:21 AM  ?Depression screen PHQ 2/9  ?Decreased Interest 1 0 1  ?Down, Depressed, Hopeless 1 0 1  ?PHQ - 2 Score 2 0 2  ?Altered sleeping 1 0 1  ?Tired, decreased energy $RemoveBeforeDE'1 1 2  'rbPGhvutOJdsmbC$ ?Change in appetite 0 0 1  ?Feeling bad or failure about  yourself  1 0 1  ?Trouble concentrating 0 0 1  ?Moving slowly or fidgety/restless 0 0 0  ?Suicidal thoughts 0 0 0  ?PHQ-9 Score $RemoveBefo'5 1 8  'BoIOOHCapYr$ ?Difficult doing work/chores Not difficult at all Not difficult at all Somewhat difficult  ?  ?Social History  ? ?Tobacco Use  ?Smoking Status Former  ? Packs/day: 1.50  ? Years: 35.00  ? Pack years: 52.50  ? Types: Cigarettes  ? Quit date: 11/14/2011  ? Years since quitting: 10.1  ?Smokeless Tobacco Never  ?Tobacco Comments  ? quit 2 weeks prior to CABG 12/2011  ? ?BP Readings from Last 3 Encounters:  ?08/07/21 (!) 143/81  ?05/06/21 127/77  ?02/01/21 124/89  ? ?Pulse Readings from Last 3 Encounters:  ?08/07/21 93  ?05/06/21 82  ?02/01/21 71  ? ?Wt Readings from Last 3 Encounters:  ?08/07/21 169 lb 12.8 oz (77 kg)  ?05/06/21 166 lb 9.6 oz (75.6 kg)  ?02/01/21 167 lb 11.2 oz (76.1 kg)  ? ?BMI Readings from Last 3 Encounters:  ?08/07/21 24.36 kg/m?  ?05/06/21 23.90 kg/m?  ?02/01/21 23.39 kg/m?  ? ? ?Assessment/Interventions: Review of patient past medical history, allergies, medications, health status, including review of consultants reports, laboratory and other test data, was performed as part of comprehensive evaluation and provision of chronic care management services.  ? ?SDOH:  (Social Determinants of Health) assessments and interventions performed: Yes ? ?SDOH Screenings  ? ?Alcohol Screen: Low Risk   ? Last Alcohol Screening Score (AUDIT): 3  ?Depression (PHQ2-9): Medium Risk  ? PHQ-2 Score: 5  ?Financial Resource Strain: Not on file  ?Food Insecurity: Not on file  ?Housing: Not on file  ?Physical Activity: Not on file  ?Social Connections: Not on file  ?Stress: Not on file  ?Tobacco Use: Medium Risk  ? Smoking Tobacco Use: Former  ? Smokeless Tobacco Use: Never  ? Passive Exposure: Not on file  ?Transportation Needs: Not on file  ? ? ?[place] ? ?Allergies  ?Allergen Reactions  ? Tramadol Rash  ? ? ?Medications Reviewed Today   ? ? Reviewed by Tally Joe  T, FNP (Family  Nurse Practitioner) on 08/07/21 at 53  Med List Status: <None>  ? ?Medication Order Taking? Sig Documenting Provider Last Dose Status Informant  ?atorvastatin (LIPITOR) 40 MG tablet [id number] Yes TAKE 1 TABLET(40 MG) BY MOUTH DAILY Gwyneth Sprout, FNP Taking Active   ?clonazePAM (KLONOPIN) 0.5 MG tablet [id number] Yes TAKE 1 TABLET(0.5 MG) BY MOUTH DAILY Gwyneth Sprout, FNP Taking Active   ?FARXIGA 10 MG TABS tablet [id number] Yes TAKE 1 TABLET(10 MG) BY MOUTH DAILY [place], Dionne Bucy, MD Taking Active   ?ferrous sulfate 325 (65 FE) MG EC tablet [id number] Yes Take 1 tablet (325 mg total) by mouth 3 (three) times daily with meals. Mar Daring, PA-C Taking Active   ?glipiZIDE (GLUCOTROL XL) 10 MG 24 hr tablet [id number] Yes TAKE 1 TABLET(10 MG) BY MOUTH DAILY WITH BREAKFAST Bacigalupo, Dionne Bucy, MD Taking Active   ?  Discontinued 08/07/21 1427 (Completed Course)   ?metFORMIN (GLUCOPHAGE) 1000 MG tablet [id number] Yes TAKE 1 TABLET(1000 MG) BY MOUTH TWICE DAILY WITH A MEAL Gwyneth Sprout, FNP Taking Active   ?metoprolol tartrate (LOPRESSOR) 25 MG tablet [id number] Yes TAKE 1 TABLET(25 MG) BY MOUTH TWICE DAILY Bac[place], Dionne Bucy, MD Taking Active   ?omeprazole (PRILOSEC) 40 MG capsule [id number] Yes TAKE 1 CAPSULE(40 MG) BY MOUTH DAILY Gwyneth Sprout, FNP Taking Active   ?sildenafil (REVATIO) 20 MG tablet [id number] Yes 3-5 tablets 1 hour prior to intercourse Hollice Espy, MD Taking Active   ?venlafaxine XR (EFFEXOR-XR) 150 MG 24 hr capsule [id number] Yes TAKE 1 CAPSULE(150 MG) BY MOUTH DAILY WITH BREAKFAST Bacigalupo, Dionne Bucy, MD Taking Active   ? ?  ?  ? ?  ? ? ?Patient Active Problem List  ? Diagnosis Date Noted  ? Low HDL (under 40) 08/07/2021  ? Essential hypertension 08/07/2021  ? Chronic idiopathic constipation 08/07/2021  ? Elevated serum glucose 08/07/2021  ? BPH associated with nocturia 08/07/2021  ? Annual physical exam 08/07/2021  ? Prostate cancer screening 08/07/2021  ? Tobacco abuse, in  remission 08/07/2021  ? Essential (hemorrhagic) thrombocythemia ([place]) 01/20/2020  ? Anxiety 07/08/2017  ? Diabetes mellitus ([place]) 07/08/2017  ? Polyp of sigmoid colon   ? Iron deficiency anemia due to chronic blood

## 2022-01-17 ENCOUNTER — Other Ambulatory Visit: Payer: Self-pay | Admitting: Family Medicine

## 2022-01-17 DIAGNOSIS — E119 Type 2 diabetes mellitus without complications: Secondary | ICD-10-CM

## 2022-01-17 NOTE — Telephone Encounter (Signed)
Requested Prescriptions  ?Pending Prescriptions Disp Refills  ?? metFORMIN (GLUCOPHAGE) 1000 MG tablet [Pharmacy Med Name: METFORMIN $RemoveBeforeD'1000MG'[id number]$  TABLETS] 180 tablet 1  ?  Sig: TAKE 1 TABLET(1000 MG) BY MOUTH TWICE DAILY WITH A MEAL  ?  ? Endocrinology:  Diabetes - Biguanides Failed - 01/17/2022  6:28 AM  ?  ?  Failed - Cr in normal range and within 360 days  ?  Creat  ?Date Value Ref Range Status  ?06/18/2015 0.76 0.70 - 1.33 mg/dL Final  ? ?Creatinine, Ser  ?Date Value Ref Range Status  ?08/07/2021 0.68 (L) 0.76 - 1.27 mg/dL Final  ? ?Creatinine, Urine  ?Date Value Ref Range Status  ?05/15/2014 230.5 mg/dL Final  ?   ?  ?  Failed - B12 Level in normal range and within 720 days  ?  Vitamin B-12  ?Date Value Ref Range Status  ?09/20/2013 670 211 - 911 pg/mL Final  ?   ?  ?  Passed - HBA1C is between 0 and 7.9 and within 180 days  ?  Hgb A1c MFr Bld  ?Date Value Ref Range Status  ?08/07/2021 7.8 (H) 4.8 - 5.6 % Final  ?  Comment:  ?           Prediabetes: 5.7 - 6.4 ?         Diabetes: >6.4 ?         Glycemic control for adults with diabetes: <7.0 ?  ?   ?  ?  Passed - eGFR in normal range and within 360 days  ?  GFR, Est African American  ?Date Value Ref Range Status  ?08/14/2014 >89 mL/min Final  ? ?GFR calc Af Amer  ?Date Value Ref Range Status  ?11/02/2020 113 >59 mL/min/1.73 Final  ?  Comment:  ?  **In accordance with recommendations from the NKF-ASN Task force,** ?  [facility] is in the process of updating its eGFR calculation to the ?  2021 CKD-EPI creatinine equation that estimates kidney function ?  without a race variable. ?  ? ?GFR, Est Non African American  ?Date Value Ref Range Status  ?08/14/2014 >89 mL/min Final  ?  Comment:  ?    ?The estimated GFR is a calculation valid for adults (>=46 years old) ?that uses the CKD-EPI algorithm to adjust for age and sex. It is   ?not to be used for children, pregnant women, hospitalized patients,    ?patients on dialysis, or with rapidly changing kidney function. ?According to  the NKDEP, eGFR >89 is normal, 60-89 shows mild ?impairment, 30-59 shows moderate impairment, 15-29 shows severe ?impairment and <15 is ESRD. ?  ?  ? ?GFR calc non Af Amer  ?Date Value Ref Range Status  ?11/02/2020 97 >59 mL/min/1.73 Final  ? ?eGFR  ?Date Value Ref Range Status  ?08/07/2021 105 >59 mL/min/1.73 Final  ?   ?  ?  Passed - Valid encounter within last 6 months  ?  Recent Outpatient Visits   ?      ? 5 months ago Annual physical exam  ? [facility] Gwyneth Sprout, FNP  ? 8 months ago Hypertension associated with diabetes ([place])  ? [facility] [place], Dionne Bucy, MD  ? 11 months ago Hypertension associated with diabetes [facility])  ? [facility] Bacigalupo, Dionne Bucy, MD  ? 1 year ago Primary hypertension  ? [place], [place]  ? 1 year ago Type 2 diabetes mellitus without complication, without long-term  current use of insulin ([place])  ? [place], [place]  ?  ?  ? ?  ?  ?  Passed - CBC within normal limits and completed in the last 12 months  ?  WBC  ?Date Value Ref Range Status  ?02/01/2021 8.1 3.4 - 10.8 x10E3/uL Final  ?11/25/2017 8.6 3.8 - 10.6 K/uL Final  ? ?RBC  ?Date Value Ref Range Status  ?02/01/2021 5.61 4.14 - 5.80 x10E6/uL Final  ?11/25/2017 5.11 4.40 - 5.90 MIL/uL Final  ? ?Hemoglobin  ?Date Value Ref Range Status  ?02/01/2021 15.8 13.0 - 17.7 g/dL Final  ? ?Hematocrit  ?Date Value Ref Range Status  ?02/01/2021 48.1 37.5 - 51.0 % Final  ? ?MCHC  ?Date Value Ref Range Status  ?02/01/2021 32.8 31.5 - 35.7 g/dL Final  ?11/25/2017 34.2 32.0 - 36.0 g/dL Final  ? ?MCH  ?Date Value Ref Range Status  ?02/01/2021 28.2 26.6 - 33.0 pg Final  ?11/25/2017 30.0 26.0 - 34.0 pg Final  ? ?MCV  ?Date Value Ref Range Status  ?02/01/2021 86 79 - 97 fL Final  ? ?No results found for: PLTCOUNTKUC, LABPLAT, [place] ?RDW  ?Date Value Ref Range Status  ?02/01/2021 14.3 11.6 - 15.4 % Final  ? ?  ?  ?  ? ? ?

## 2022-04-14 ENCOUNTER — Encounter: Payer: Self-pay | Admitting: Oncology

## 2022-04-15 ENCOUNTER — Encounter: Payer: Self-pay | Admitting: Family Medicine

## 2022-04-15 ENCOUNTER — Ambulatory Visit (INDEPENDENT_AMBULATORY_CARE_PROVIDER_SITE_OTHER): Payer: BC Managed Care – PPO | Admitting: Family Medicine

## 2022-04-15 ENCOUNTER — Encounter: Payer: Self-pay | Admitting: Oncology

## 2022-04-15 VITALS — BP 123/68 | HR 67 | Resp 16 | Wt 161.0 lb

## 2022-04-15 DIAGNOSIS — E119 Type 2 diabetes mellitus without complications: Secondary | ICD-10-CM

## 2022-04-15 DIAGNOSIS — E1159 Type 2 diabetes mellitus with other circulatory complications: Secondary | ICD-10-CM

## 2022-04-15 DIAGNOSIS — E785 Hyperlipidemia, unspecified: Secondary | ICD-10-CM

## 2022-04-15 DIAGNOSIS — D500 Iron deficiency anemia secondary to blood loss (chronic): Secondary | ICD-10-CM

## 2022-04-15 DIAGNOSIS — I152 Hypertension secondary to endocrine disorders: Secondary | ICD-10-CM

## 2022-04-15 DIAGNOSIS — E1169 Type 2 diabetes mellitus with other specified complication: Secondary | ICD-10-CM

## 2022-04-15 DIAGNOSIS — I10 Essential (primary) hypertension: Secondary | ICD-10-CM

## 2022-04-15 LAB — POCT GLYCOSYLATED HEMOGLOBIN (HGB A1C): Hemoglobin A1C: 7.2 % — AB (ref 4.0–5.6)

## 2022-04-15 MED ORDER — ATORVASTATIN CALCIUM 40 MG PO TABS: 40.0000 mg | ORAL_TABLET | Freq: Every day | ORAL | 3 refills | Status: DC

## 2022-04-15 MED ORDER — METFORMIN HCL 1000 MG PO TABS: 1000.0000 mg | ORAL_TABLET | Freq: Two times a day (BID) | ORAL | 3 refills | Status: DC

## 2022-04-15 MED ORDER — FERROUS SULFATE 325 (65 FE) MG PO TBEC: 325.0000 mg | DELAYED_RELEASE_TABLET | Freq: Two times a day (BID) | ORAL | Status: AC

## 2022-04-15 NOTE — Assessment & Plan Note (Signed)
Chronic, improved Continue iron 65 mg BID with meals to assist

## 2022-04-15 NOTE — Assessment & Plan Note (Signed)
Chronic, improved Reports out of medication for 2.5 months at regular dose A1c 7.2% Continue to recommend balanced, lower carb meals. Smaller meal size, adding snacks. Choosing water as drink of choice and increasing purposeful exercise. Continue farxiga 10 mg; glipizide 10 mg; metformin 1000 mg BID

## 2022-04-15 NOTE — Progress Notes (Signed)
Established patient visit  I,Joseph Terry,acting as a scribe for Joseph Sprout, FNP.,have documented all relevant documentation on the behalf of Joseph Sprout, FNP,as directed by  Joseph Sprout, FNP while in the presence of Joseph Sprout, FNP.   Patient: Joseph Terry   DOB: Feb 03, 1961   61 y.o. Male  MRN: [id number] Visit Date: 04/15/2022  Today's healthcare provider: Gwyneth Sprout, FNP  Introduced to nurse practitioner role and practice setting.  All questions answered.  Discussed provider/patient relationship and expectations.   Chief Complaint  Patient presents with   Follow-up   Diabetes   Subjective    HPI  Diabetes Mellitus Type II, follow-up  Lab Results  Component Value Date   HGBA1C 7.2 (A) 04/15/2022   HGBA1C 7.8 (H) 08/07/2021   HGBA1C 6.7 (A) 02/01/2021   Last seen for diabetes 9 months ago.  Management since then includes continuing the same treatment.  Home blood sugar records: fasting range: not checking  Most Recent Eye Exam: sent request for most recent eye exam.  --------------------------------------------------------------------------------------------------- Hypertension, follow-up  BP Readings from Last 3 Encounters:  04/15/22 123/68  08/07/21 (!) 143/81  05/06/21 127/77   Wt Readings from Last 3 Encounters:  04/15/22 161 lb (73 kg)  08/07/21 169 lb 12.8 oz (77 kg)  05/06/21 166 lb 9.6 oz (75.6 kg)     He was last seen for hypertension 9 months ago.  Management since that visit includes taking metoprolol 25 mg. Outside blood pressures are not checking.  ---------------------------------------------------------------------------------------------------   Medications: Outpatient Medications Prior to Visit  Medication Sig   clonazePAM (KLONOPIN) 0.5 MG tablet TAKE 1 TABLET(0.5 MG) BY MOUTH DAILY   dapagliflozin propanediol (FARXIGA) 10 MG TABS tablet Take 1 tablet (10 mg total) by mouth daily.   fenofibrate (TRICOR) 145 MG  tablet Take 1 tablet (145 mg total) by mouth daily.   glipiZIDE (GLUCOTROL XL) 10 MG 24 hr tablet Take 1 tablet (10 mg total) by mouth daily with breakfast.   metoprolol tartrate (LOPRESSOR) 25 MG tablet TAKE 1 TABLET(25 MG) BY MOUTH TWICE DAILY   omeprazole (PRILOSEC) 40 MG capsule TAKE 1 CAPSULE(40 MG) BY MOUTH DAILY   tamsulosin (FLOMAX) 0.4 MG CAPS capsule Take 1 capsule (0.4 mg total) by mouth daily.   venlafaxine XR (EFFEXOR-XR) 150 MG 24 hr capsule Take 1 capsule (150 mg total) by mouth daily with breakfast.   [DISCONTINUED] ferrous sulfate 325 (65 FE) MG EC tablet Take 1 tablet (325 mg total) by mouth 3 (three) times daily with meals. (Patient taking differently: Take 325 mg by mouth 2 (two) times daily with breakfast and lunch.)   [DISCONTINUED] metFORMIN (GLUCOPHAGE) 1000 MG tablet TAKE 1 TABLET(1000 MG) BY MOUTH TWICE DAILY WITH A MEAL   sildenafil (REVATIO) 20 MG tablet 3-5 tablets 1 hour prior to intercourse (Patient not taking: Reported on 04/15/2022)   [DISCONTINUED] rosuvastatin (CRESTOR) 20 MG tablet Take 1 tablet (20 mg total) by mouth daily. (Patient not taking: Reported on 04/15/2022)   No facility-administered medications prior to visit.    Review of Systems  Constitutional:  Negative for appetite change, chills and fever.  Respiratory:  Negative for chest tightness, shortness of breath and wheezing.   Cardiovascular:  Negative for chest pain and palpitations.  Gastrointestinal:  Negative for abdominal pain, nausea and vomiting.       Objective    BP 123/68 (BP Location: Right Arm, Patient Position: Sitting, Cuff Size: Normal)   Pulse  67   Resp 16   Wt 161 lb (73 kg)   SpO2 98%   BMI 23.10 kg/m    Physical Exam Vitals and nursing note reviewed.  Constitutional:      Appearance: Normal appearance. He is normal weight.  HENT:     Head: Normocephalic and atraumatic.  Eyes:     Pupils: Pupils are equal, round, and reactive to light.  Cardiovascular:     Rate and  Rhythm: Normal rate and regular rhythm.     Pulses: Normal pulses.     Heart sounds: Normal heart sounds.  Pulmonary:     Effort: Pulmonary effort is normal.     Breath sounds: Normal breath sounds.  Musculoskeletal:        General: Normal range of motion.     Cervical back: Normal range of motion.  Skin:    General: Skin is warm and dry.     Capillary Refill: Capillary refill takes less than 2 seconds.  Neurological:     General: No focal deficit present.     Mental Status: He is alert and oriented to person, place, and time. Mental status is at baseline.  Psychiatric:        Mood and Affect: Mood normal.        Behavior: Behavior normal.        Thought Content: Thought content normal.        Judgment: Judgment normal.      Results for orders placed or performed in visit on 04/15/22  POCT glycosylated hemoglobin (Hb A1C)  Result Value Ref Range   Hemoglobin A1C 7.2 (A) 4.0 - 5.6 %    Assessment & Plan     Problem List Items Addressed This Visit       Cardiovascular and Mediastinum   RESOLVED: Essential hypertension   Relevant Medications   atorvastatin (LIPITOR) 40 MG tablet   Hypertension associated with diabetes (HCC)    Chronic, stable At goal, <130/<80 Continue farxiga 10 mg, metop 25 mg BID Repeat CMP at CPE in 07/2022      Relevant Medications   metFORMIN (GLUCOPHAGE) 1000 MG tablet   atorvastatin (LIPITOR) 40 MG tablet     Endocrine   Diabetes mellitus ([place]) - Primary    Chronic, improved Reports out of medication for 2.5 months at regular dose A1c 7.2% Continue to recommend balanced, lower carb meals. Smaller meal size, adding snacks. Choosing water as drink of choice and increasing purposeful exercise. Continue farxiga 10 mg; glipizide 10 mg; metformin 1000 mg BID      Relevant Medications   metFORMIN (GLUCOPHAGE) 1000 MG tablet   atorvastatin (LIPITOR) 40 MG tablet   Other Relevant Orders   POCT glycosylated hemoglobin (Hb A1C) (Completed)    Hyperlipidemia associated with type 2 diabetes mellitus (HCC)    Chronic, request for Lipitor 40 mg; previous recommendation for crestor 20 mg to assist with ASCVD risk Continue fenofibrate 145 mg daily recommend diet low in saturated fat and regular exercise - 30 min at least 5 times per week       Relevant Medications   metFORMIN (GLUCOPHAGE) 1000 MG tablet   atorvastatin (LIPITOR) 40 MG tablet     Other   Iron deficiency anemia due to chronic blood loss    Chronic, improved Continue iron 65 mg BID with meals to assist       Relevant Medications   ferrous sulfate 325 (65 FE) MG EC tablet     Return in about  4 months (around 08/15/2022) for annual examination.      Vonna Kotyk, FNP, have reviewed all documentation for this visit. The documentation on 04/15/22 for the exam, diagnosis, procedures, and orders are all accurate and complete.    Joseph Terry, [place] [phone] (phone) [phone] (fax)  [place]

## 2022-04-15 NOTE — Assessment & Plan Note (Signed)
Chronic, request for Lipitor 40 mg; previous recommendation for crestor 20 mg to assist with ASCVD risk Continue fenofibrate 145 mg daily recommend diet low in saturated fat and regular exercise - 30 min at least 5 times per week

## 2022-04-15 NOTE — Assessment & Plan Note (Signed)
Chronic, stable At goal, <130/<80 Continue farxiga 10 mg, metop 25 mg BID Repeat CMP at CPE in 07/2022

## 2022-04-16 ENCOUNTER — Encounter: Payer: Self-pay | Admitting: Family Medicine

## 2022-05-04 ENCOUNTER — Other Ambulatory Visit: Payer: Self-pay | Admitting: Family Medicine

## 2022-05-04 DIAGNOSIS — F418 Other specified anxiety disorders: Secondary | ICD-10-CM

## 2022-05-30 ENCOUNTER — Telehealth: Payer: Self-pay | Admitting: Family Medicine

## 2022-05-30 NOTE — Telephone Encounter (Signed)
[facility] faxed refill request for the following medications:  atorvastatin (LIPITOR) 40 MG tablet   venlafaxine XR (EFFEXOR-XR) 150 MG 24 hr capsule   omeprazole (PRILOSEC) 40 MG capsule   clonazePAM (KLONOPIN) 0.5 MG tablet   tamsulosin (FLOMAX) 0.4 MG CAPS capsule   fenofibrate (TRICOR) 145 MG tablet  metFORMIN (GLUCOPHAGE) 1000 MG tablet   dapagliflozin propanediol (FARXIGA) 10 MG TABS tablet   rosuvastatin (CRESTOR) 20 MG tablet  Please advise.

## 2022-06-02 ENCOUNTER — Other Ambulatory Visit: Payer: Self-pay | Admitting: Family Medicine

## 2022-06-02 DIAGNOSIS — E1169 Type 2 diabetes mellitus with other specified complication: Secondary | ICD-10-CM

## 2022-06-02 DIAGNOSIS — F418 Other specified anxiety disorders: Secondary | ICD-10-CM

## 2022-06-02 DIAGNOSIS — E119 Type 2 diabetes mellitus without complications: Secondary | ICD-10-CM

## 2022-06-02 DIAGNOSIS — E1159 Type 2 diabetes mellitus with other circulatory complications: Secondary | ICD-10-CM

## 2022-06-02 DIAGNOSIS — K2270 Barrett's esophagus without dysplasia: Secondary | ICD-10-CM

## 2022-06-02 DIAGNOSIS — I10 Essential (primary) hypertension: Secondary | ICD-10-CM

## 2022-06-02 DIAGNOSIS — N401 Enlarged prostate with lower urinary tract symptoms: Secondary | ICD-10-CM

## 2022-06-02 MED ORDER — OMEPRAZOLE 40 MG PO CPDR: DELAYED_RELEASE_CAPSULE | ORAL | 3 refills | Status: DC

## 2022-06-02 MED ORDER — TAMSULOSIN HCL 0.4 MG PO CAPS: 0.4000 mg | ORAL_CAPSULE | Freq: Every day | ORAL | 3 refills | Status: DC

## 2022-06-02 MED ORDER — FENOFIBRATE 145 MG PO TABS: 145.0000 mg | ORAL_TABLET | Freq: Every day | ORAL | 3 refills | Status: DC

## 2022-06-02 MED ORDER — GLIPIZIDE ER 10 MG PO TB24: 10.0000 mg | ORAL_TABLET | Freq: Every day | ORAL | 3 refills | Status: DC

## 2022-06-02 MED ORDER — ATORVASTATIN CALCIUM 40 MG PO TABS: 40.0000 mg | ORAL_TABLET | Freq: Every day | ORAL | 3 refills | Status: DC

## 2022-06-02 MED ORDER — METFORMIN HCL 1000 MG PO TABS: 1000.0000 mg | ORAL_TABLET | Freq: Two times a day (BID) | ORAL | 3 refills | Status: DC

## 2022-06-02 MED ORDER — CLONAZEPAM 0.5 MG PO TABS: ORAL_TABLET | ORAL | 0 refills | Status: DC

## 2022-06-02 MED ORDER — VENLAFAXINE HCL ER 150 MG PO CP24: 150.0000 mg | ORAL_CAPSULE | Freq: Every day | ORAL | 3 refills | Status: DC

## 2022-06-02 MED ORDER — METOPROLOL TARTRATE 25 MG PO TABS: ORAL_TABLET | ORAL | 3 refills | Status: DC

## 2022-06-02 MED ORDER — DAPAGLIFLOZIN PROPANEDIOL 10 MG PO TABS
10.0000 mg | ORAL_TABLET | Freq: Every day | ORAL | 3 refills | Status: DC
Start: 2022-06-02 — End: 2023-05-20

## 2022-06-04 NOTE — Telephone Encounter (Signed)
Pt called to report that he is out of his prescription, [facility] just [facility] and was told that he will not receive his supply until the 30th. He says he just needs an emergency supply to last him until his delivery. He says this can be sent to [facility]. Please advise, patient does not want to face the symptoms of not taking this medication.   clonazePAM (KLONOPIN) 0.5 MG tablet  [id number] [address], [place] [address] [address]  Phone: [phone] Fax: [phone]

## 2022-06-05 ENCOUNTER — Other Ambulatory Visit: Payer: Self-pay | Admitting: Family Medicine

## 2022-06-05 DIAGNOSIS — F418 Other specified anxiety disorders: Secondary | ICD-10-CM

## 2022-06-05 MED ORDER — CLONAZEPAM 0.5 MG PO TABS: ORAL_TABLET | ORAL | 0 refills | Status: DC

## 2022-06-10 ENCOUNTER — Telehealth: Payer: Self-pay | Admitting: Family Medicine

## 2022-06-10 NOTE — Telephone Encounter (Signed)
Medication is not on the list, please advise.

## 2022-06-10 NOTE — Telephone Encounter (Signed)
Medication Refill - Medication: rosuvastatin (CRESTOR) 20 MG tablet   Has the patient contacted their pharmacy? Yes.     Preferred Pharmacy (with phone number or street name):  [facility], [place] Phone:  [phone]  Fax:  [phone]      Has the patient been seen for an appointment in the last year OR does the patient have an upcoming appointment? Yes.    [facility] called in stating that got refill requests on all medications but the rosuvastatin.

## 2022-06-16 ENCOUNTER — Telehealth: Payer: Self-pay | Admitting: Family Medicine

## 2022-06-16 NOTE — Telephone Encounter (Signed)
[facility] faxed refill request for the following medications:  rosuvastatin (CRESTOR) 20 MG tablet   Please advise.

## 2022-06-24 DIAGNOSIS — L03114 Cellulitis of left upper limb: Secondary | ICD-10-CM | POA: Diagnosis not present

## 2022-06-24 DIAGNOSIS — L255 Unspecified contact dermatitis due to plants, except food: Secondary | ICD-10-CM | POA: Diagnosis not present

## 2022-06-26 DIAGNOSIS — L255 Unspecified contact dermatitis due to plants, except food: Secondary | ICD-10-CM | POA: Diagnosis not present

## 2022-06-26 DIAGNOSIS — L03114 Cellulitis of left upper limb: Secondary | ICD-10-CM | POA: Diagnosis not present

## 2022-07-24 DIAGNOSIS — L309 Dermatitis, unspecified: Secondary | ICD-10-CM | POA: Diagnosis not present

## 2022-08-11 ENCOUNTER — Encounter: Payer: BC Managed Care – PPO | Admitting: Family Medicine

## 2022-08-13 ENCOUNTER — Encounter: Payer: Self-pay | Admitting: Family Medicine

## 2022-08-13 ENCOUNTER — Ambulatory Visit (INDEPENDENT_AMBULATORY_CARE_PROVIDER_SITE_OTHER): Payer: BC Managed Care – PPO | Admitting: Family Medicine

## 2022-08-13 VITALS — BP 152/71 | HR 78 | Resp 16 | Wt 155.0 lb

## 2022-08-13 DIAGNOSIS — R21 Rash and other nonspecific skin eruption: Secondary | ICD-10-CM | POA: Diagnosis not present

## 2022-08-13 MED ORDER — TRIAMCINOLONE ACETONIDE 0.1 % EX OINT: 1.0000 | TOPICAL_OINTMENT | Freq: Two times a day (BID) | CUTANEOUS | 1 refills | Status: DC

## 2022-08-13 MED ORDER — HYDROXYZINE PAMOATE 25 MG PO CAPS: 25.0000 mg | ORAL_CAPSULE | Freq: Three times a day (TID) | ORAL | 1 refills | Status: DC | PRN

## 2022-08-13 NOTE — Assessment & Plan Note (Addendum)
Unclear etiology. Per chart review, perhaps initially with marked allergic reaction to poison plant with secondary infection with good response to steroid/antibiotics but with repeated flares and no known ongoing exposure or other preceding event/medication, question underlying inflammatory component. No signs of superimposed infection on exam today. Agree with derm evaluation for possible biopsy, referral placed today. Certainly made worse by repeated scratching evidenced by multiple excoriations and hyperkeratotic scarring, will provide hydroxyzine to aid in itching, rest. Refill triamcinolone ointment today.

## 2022-08-13 NOTE — Progress Notes (Signed)
    SUBJECTIVE:   CHIEF COMPLAINT / HPI:   RASH - previously seen at UC 10/10, 10/12 and 11/9 for same. Initial rash with red bumps to R forearm after cutting trees that progressed to L forearm with marked swelling and diffuse redness, refractory to OTC anti-itch creams. Initially treated for cellulitis and poison oak 10/10, given solumedrol injection, prednisone and doxycycline with improvement. 10/12 recheck improved. Symptoms returned prompting 11/9 UC visit, received prednisone and triamcinolone cream. Lots of relief with topical steroid. Referred to Derm but hasn't seen yet. - symptoms returned yesterday with redness, scaling and intense itching. - worse with warm water. Cold water helps - skin turning white.  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 No change in work chemicals since June.   Duration:  ~6 weeks, intermittent  Location: b/l forearms, back of neck  Itching: yes Burning: yes Redness: yes Oozing: resolved Scaling: yes Blisters: not anymore Painful: yes Fevers: no Change in detergents/soaps/personal care products: not preceding initial rash. Has since changed to hypoallergenic soaps, detergents, creams. Recent illness: no Medicine changes or recent antibiotics: no Recent travel:no History of same: no Context: fluctuating Alleviating factors: triamcinolone cream Treatments attempted:hydrocortisone cream and lotion/moisturizer, OTC anti-itch cream, prednisone, antibiotic, eucerin, benadryl Shortness of breath: no  Throat/tongue swelling: no Myalgias/arthralgias: no   OBJECTIVE:   BP (!) 152/71 (BP Location: Left Arm, Patient Position: Sitting, Cuff Size: Normal)   Pulse 78   Resp 16   Wt 155 lb (70.3 kg)   SpO2 99%   BMI 22.24 kg/m   Gen: well appearing, in NAD Skin: hyperkeratotic dry erythematous rash to b/l forearms and back of neck with diffuse excoriations and scale. No blisters.  Ext: WWP, no edema. 2+ radial  pulses.   ASSESSMENT/PLAN:   Rash Unclear etiology. Per chart review, perhaps initially with marked allergic reaction to poison plant with secondary infection with good response to steroid/antibiotics but with repeated flares and no known ongoing exposure or other preceding event/medication, question underlying inflammatory component. No signs of superimposed infection on exam today. Agree with derm evaluation for possible biopsy, referral placed today. Certainly made worse by repeated scratching evidenced by multiple excoriations and hyperkeratotic scarring, will provide hydroxyzine to aid in itching, rest. Refill triamcinolone ointment today.      Myles Gip, DO

## 2022-08-18 ENCOUNTER — Encounter: Payer: Self-pay | Admitting: Oncology

## 2022-08-20 ENCOUNTER — Encounter: Payer: Self-pay | Admitting: Oncology

## 2022-08-20 ENCOUNTER — Ambulatory Visit: Payer: BLUE CROSS/BLUE SHIELD | Admitting: Dermatology

## 2022-08-20 ENCOUNTER — Ambulatory Visit (INDEPENDENT_AMBULATORY_CARE_PROVIDER_SITE_OTHER): Payer: BC Managed Care – PPO | Admitting: Dermatology

## 2022-08-20 DIAGNOSIS — L2089 Other atopic dermatitis: Secondary | ICD-10-CM | POA: Diagnosis not present

## 2022-08-20 DIAGNOSIS — Z79899 Other long term (current) drug therapy: Secondary | ICD-10-CM | POA: Diagnosis not present

## 2022-08-20 DIAGNOSIS — L255 Unspecified contact dermatitis due to plants, except food: Secondary | ICD-10-CM

## 2022-08-20 DIAGNOSIS — R21 Rash and other nonspecific skin eruption: Secondary | ICD-10-CM | POA: Diagnosis not present

## 2022-08-20 MED ORDER — OPZELURA 1.5 % EX CREA: TOPICAL_CREAM | CUTANEOUS | 1 refills | Status: DC

## 2022-08-20 NOTE — Patient Instructions (Addendum)
Discontinue triamcinolone cream   Can continue triamcinolone ointment can apply to affected areas daily but only use 3 days a week on Friday through Sunday. Avoid applying to face, groin, and axilla. Use as directed. Long-term use can cause thinning of the skin.  Topical steroids (such as triamcinolone, fluocinolone, fluocinonide, mometasone, clobetasol, halobetasol, betamethasone, hydrocortisone) can cause thinning and lightening of the skin if they are used for too long in the same area. Your physician has selected the right strength medicine for your problem and area affected on the body. Please use your medication only as directed by your physician to prevent side effects.   Start opzelura 1.5 % cream - apply twice daily to affected areas of rash daily.  Your prescription was sent to [facility] in [place]. A representative from [facility] will contact you within 3 business hours to verify your address and insurance information to schedule a free delivery. If for any reason you do not receive a phone call from them, please reach out to them. Their phone number is [phone] and their hours are Monday-Friday 9:00 am-5:00 pm.     Due to recent changes in healthcare laws, you may see results of your pathology and/or laboratory studies on MyChart before the doctors have had a chance to review them. We understand that in some cases there may be results that are confusing or concerning to you. Please understand that not all results are received at the same time and often the doctors may need to interpret multiple results in order to provide you with the best plan of care or course of treatment. Therefore, we ask that you please give [place] 2 business days to thoroughly review all your results before contacting the office for clarification. Should we see a critical lab result, you will be contacted sooner.   If You Need Anything After Your Visit  If you have any questions or concerns for  your doctor, please call our main line at [phone] and press option 4 to reach your doctor's medical assistant. If no one answers, please leave a voicemail as directed and we will return your call as soon as possible. Messages left after 4 pm will be answered the following business day.   You may also send [place] a message via [place]. We typically respond to MyChart messages within 1-2 business days.  For prescription refills, please ask your pharmacy to contact our office. Our fax number is [phone].  If you have an urgent issue when the clinic is closed that cannot wait until the next business day, you can page your doctor at the number below.    Please note that while we do our best to be available for urgent issues outside of office hours, we are not available 24/7.   If you have an urgent issue and are unable to reach [place], you may choose to seek medical care at your doctor's office, retail clinic, urgent care center, or emergency room.  If you have a medical emergency, please immediately call 911 or go to the emergency department.  Pager Numbers  - Dr. Nehemiah Massed: [phone]  - Dr. Laurence Ferrari: [phone]  - Dr. Nicole Kindred: [phone]  In the event of inclement weather, please call our main line at [phone] for an update on the status of any delays or closures.  Dermatology Medication Tips: Please keep the boxes that topical medications come in in order to help keep track of the instructions about where and how to use these. Pharmacies typically print the  medication instructions only on the boxes and not directly on the medication tubes.   If your medication is too expensive, please contact our office at [phone] option 4 or send [place] a message through [place].   We are unable to tell what your co-pay for medications will be in advance as this is different depending on your insurance coverage. However, we may be able to find a substitute medication at lower cost or fill out  paperwork to get insurance to cover a needed medication.   If a prior authorization is required to get your medication covered by your insurance company, please allow [place] 1-2 business days to complete this process.  Drug prices often vary depending on where the prescription is filled and some pharmacies may offer cheaper prices.  The website www.goodrx.com contains coupons for medications through different pharmacies. The prices here do not account for what the cost may be with help from insurance (it may be cheaper with your insurance), but the website can give you the price if you did not use any insurance.  - You can print the associated coupon and take it with your prescription to the pharmacy.  - You may also stop by our office during regular business hours and pick up a GoodRx coupon card.  - If you need your prescription sent electronically to a different pharmacy, notify our office through [facility] or by phone at [phone] option 4.     Si Usted Necesita Algo Despus de Su Visita  Tambin puede enviarnos un mensaje a travs de Pharmacist, community. Por lo general respondemos a los mensajes de MyChart en el transcurso de 1 a 2 das hbiles.  Para renovar recetas, por favor pida a su farmacia que se ponga en contacto con nuestra oficina. Harland Dingwall de fax es [place] [phone].  Si tiene un asunto urgente cuando la clnica est cerrada y que no puede esperar hasta el siguiente da hbil, puede llamar/localizar a su doctor(a) al nmero que aparece a continuacin.   Por favor, tenga en cuenta que aunque hacemos todo lo posible para estar disponibles para asuntos urgentes fuera del horario de [place], no estamos disponibles las 24 horas del da, los 7 das de la [place].   Si tiene un problema urgente y no puede comunicarse con nosotros, puede optar por buscar atencin mdica  en el consultorio de su doctor(a), en una clnica privada, en un centro de atencin urgente o en una sala de  emergencias.  Si tiene Engineering geologist, por favor llame inmediatamente al 911 o vaya a la sala de emergencias.  Nmeros de bper  - Dr. Nehemiah Massed: [phone]  - Dra. Moye: [phone]  - Dra. Nicole Kindred: [phone]  En caso de inclemencias del [place], por favor llame a Johnsie Kindred principal al [phone] para una actualizacin sobre el [place] de cualquier retraso o cierre.  Consejos para la medicacin en dermatologa: Por favor, guarde las cajas en las que vienen los medicamentos de uso tpico para ayudarle a seguir las instrucciones sobre dnde y cmo usarlos. Las farmacias generalmente imprimen las instrucciones del medicamento slo en las cajas y no directamente en los tubos del [place].   Si su medicamento es muy caro, por favor, pngase en contacto con Zigmund Daniel llamando al [phone] y presione la opcin 4 o envenos un mensaje a travs de Pharmacist, community.   No podemos decirle cul ser su copago por los medicamentos por adelantado ya que esto es diferente dependiendo de la cobertura de su seguro. Sin  embargo, es posible que podamos encontrar un medicamento sustituto a Electrical engineer un formulario para que el seguro cubra el medicamento que se considera necesario.   Si se requiere una autorizacin previa para que su compaa de seguros [place] su medicamento, por favor permtanos de 1 a 2 das hbiles para completar este proceso.  Los precios de los medicamentos varan con frecuencia dependiendo del Environmental consultant de dnde se surte la receta y alguna farmacias pueden ofrecer precios ms baratos.  El sitio web www.goodrx.com tiene cupones para medicamentos de Airline pilot. Los precios aqu no tienen en cuenta lo que podra costar con la ayuda del seguro (puede ser ms barato con su seguro), pero el sitio web puede darle el precio si no utiliz Research scientist (physical sciences).  - Puede imprimir el cupn correspondiente y llevarlo con su receta a la farmacia.  - Tambin puede pasar por  nuestra oficina durante el horario de atencin regular y Charity fundraiser una tarjeta de cupones de GoodRx.  - Si necesita que su receta se enve electrnicamente a una farmacia diferente, informe a nuestra oficina a travs de MyChart de [place] o por telfono llamando al [phone] y presione la opcin 4.

## 2022-08-20 NOTE — Progress Notes (Signed)
   New Patient Visit  Subjective  Joseph Terry is a 61 y.o. male who presents for the following: New Patient (Initial Visit) (A few months ago Itchy rash started after trimming bushes at home after a week later had red bump at arms then spread to left arms. Patient reports some swelling in left arm went to urgent care gave shot, antibiotic and prednisone. Patient states medication helped and went back. Irritation was still there but swelling went away ).  The following portions of the chart were reviewed this encounter and updated as appropriate:   Tobacco  Allergies  Meds  Problems  Med Hx  Surg Hx  Fam Hx     Review of Systems:  No other skin or systemic complaints except as noted in HPI or Assessment and Plan.  Objective  Well appearing patient in no apparent distress; mood and affect are within normal limits.  A full examination was performed including scalp, head, eyes, ears, nose, lips, neck, chest, axillae, abdomen, back, buttocks, bilateral upper extremities, bilateral lower extremities, hands, feet, fingers, toes, fingernails, and toenails. All findings within normal limits unless otherwise noted below.  b/l arms and posterior neck Edema, erythema, and scale with minimal crusted excoriation on arms and neck                    Assessment & Plan  Rash and other nonspecific skin eruption b/l arms and posterior neck Eczema/atopic dermatitis with history of contact dermatitis causing flare  Patient seen by [facility] on 06/24/2022  Prescribed doxycycline 100 mg  and prednisone  Patient also reports given a injection of steroid at this time.  Could not find documentation of injection.  06/26/22 seen again by [facility] for follow up  Seen by [facility] on 07/24/2022  Prescribed triamcinolone cream and more prednisone  Seen by [facility] on 08/13/22  Prescribed hydroxyzine and triamcinolone  ointment  Discontinue tmc cream daily.Can continue triamcinolone ointment to affected areas of rash on Friday, Saturday, and Sunday only - 3 days per week.   Start opzelura 1.5 % cream apply bid to affected areas of rash daily Rx sent to [facility] Can continue hydroxyzine prescription if needed for itch  Will reevaluate in 6 weeks May prescribe tacrolimus ointment at next follow up Discussed starting Adbry or Dupixent injection in future.   Ruxolitinib Phosphate (OPZELURA) 1.5 % CREA - b/l arms and posterior neck Apply bid to affected areas of rash qd  Return for 6 week rash follow up .  [name], CMA, am acting as scribe for Sarina Ser, MD. Documentation: I have reviewed the above documentation for accuracy and completeness, and I agree with the above.  Sarina Ser, MD

## 2022-08-24 ENCOUNTER — Other Ambulatory Visit: Payer: Self-pay | Admitting: Family Medicine

## 2022-08-24 DIAGNOSIS — F418 Other specified anxiety disorders: Secondary | ICD-10-CM

## 2022-08-26 ENCOUNTER — Encounter: Payer: Self-pay | Admitting: Family Medicine

## 2022-08-26 ENCOUNTER — Ambulatory Visit (INDEPENDENT_AMBULATORY_CARE_PROVIDER_SITE_OTHER): Payer: BC Managed Care – PPO | Admitting: Family Medicine

## 2022-08-26 VITALS — BP 127/76 | HR 75 | Resp 16 | Ht 70.0 in | Wt 156.0 lb

## 2022-08-26 DIAGNOSIS — E785 Hyperlipidemia, unspecified: Secondary | ICD-10-CM

## 2022-08-26 DIAGNOSIS — D500 Iron deficiency anemia secondary to blood loss (chronic): Secondary | ICD-10-CM | POA: Diagnosis not present

## 2022-08-26 DIAGNOSIS — Z122 Encounter for screening for malignant neoplasm of respiratory organs: Secondary | ICD-10-CM

## 2022-08-26 DIAGNOSIS — E1169 Type 2 diabetes mellitus with other specified complication: Secondary | ICD-10-CM | POA: Diagnosis not present

## 2022-08-26 DIAGNOSIS — Z23 Encounter for immunization: Secondary | ICD-10-CM

## 2022-08-26 DIAGNOSIS — R351 Nocturia: Secondary | ICD-10-CM

## 2022-08-26 DIAGNOSIS — E1159 Type 2 diabetes mellitus with other circulatory complications: Secondary | ICD-10-CM | POA: Diagnosis not present

## 2022-08-26 DIAGNOSIS — F418 Other specified anxiety disorders: Secondary | ICD-10-CM

## 2022-08-26 DIAGNOSIS — Z0000 Encounter for general adult medical examination without abnormal findings: Secondary | ICD-10-CM

## 2022-08-26 DIAGNOSIS — N401 Enlarged prostate with lower urinary tract symptoms: Secondary | ICD-10-CM | POA: Diagnosis not present

## 2022-08-26 NOTE — Assessment & Plan Note (Signed)
Chronic, stable Repeat PSA Continue flomax 0.4 and revatio 60-100 mg PRN

## 2022-08-26 NOTE — Assessment & Plan Note (Signed)
Chronic, stable Continue Effexor 150 mg qd  Also on low dose Klonopin 0.5 mg to assist  Contracted for safety; denies SI or HI

## 2022-08-26 NOTE — Assessment & Plan Note (Signed)
Chronic, previously elevated Will send for eye eam Repeat A1c Repeat Urine micro Continue to recommend balanced, lower carb meals. Smaller meal size, adding snacks. Choosing water as drink of choice and increasing purposeful exercise. Continue farxiga 10 mg and 1000 mg metformin BID

## 2022-08-26 NOTE — Assessment & Plan Note (Signed)
Chronic, previously stable Repeat LP Goal <55 Continue fenofibrate 145 mcg; Continue Lipitor 40 mg

## 2022-08-26 NOTE — Patient Instructions (Signed)
The CDC recommends two doses of Shingrix (the new shingles vaccine) separated by 2 to 6 months for adults age 63 years and older. I recommend checking with your insurance plan regarding coverage for this vaccine.    

## 2022-08-26 NOTE — Progress Notes (Signed)
Complete physical exam   Patient: Joseph Terry   DOB: 01/07/1961   61 y.o. Male  MRN: [id number] Visit Date: 08/26/2022  Today's healthcare provider: Gwyneth Sprout, FNP  Re Introduced to nurse practitioner role and practice setting.  All questions answered.  Discussed provider/patient relationship and expectations.  I,Tiffany J Bragg,acting as a scribe for Gwyneth Sprout, FNP.,have documented all relevant documentation on the behalf of Gwyneth Sprout, FNP,as directed by  Gwyneth Sprout, FNP while in the presence of Gwyneth Sprout, FNP.   Chief Complaint  Patient presents with   Annual Exam   Subjective    XZAVIEN HARADA is a 61 y.o. male who presents today for a complete physical exam.  He reports consuming a general diet. The patient has a physically strenuous job, but has no regular exercise apart from work.  He generally feels well. He reports sleeping well. He does have additional problems to discuss today. Patient complains of hand pain he thinks is caused from work.  HPI   Past Medical History:  Diagnosis Date   Anxiety state, unspecified    Barrett's esophagus    CAD (coronary artery disease), severe multivessel CAD surgical consult for CABG    a. 12/2011 Cath: 3 vessel CAD with normal EF;  b. 12/2011 CABG x 5: LIMA to LAD, SVG to distal RCA with sequential SVG to PDA, SVG to OM, and SVG to D2;  c. 03/2014 ETT: Ex time 6:21, 72m horizontal ST dep in V3-V6, HTN response.   Depression    Diaphragmatic hernia without mention of obstruction or gangrene    Diverticulosis    GERD (gastroesophageal reflux disease)    Hyperlipidemia    Hypertension    Impotence of organic origin    Internal hemorrhoids without mention of complication    Iron deficiency anemia due to chronic blood loss 04/15/2017   Kidney stone    Motion sickness    cars, boats   OSA on CPAP    a. Sleep study 07/2013 +sever OSA; CPAP at 10cm recommended.   Personal history of colonic polyps    Type II  diabetes mellitus ([place]    Wears contact lenses    Past Surgical History:  Procedure Laterality Date   [facility]   CHOLECYSTECTOMY  11/03/13   COLONOSCOPY  11/01/13   multiple polyps; diverticulosis. Sankar. Repeat 5 years.   COLONOSCOPY W/ POLYPECTOMY  09/16/2007   colon polyps.  Repeat 5 years. Iftikhar.   COLONOSCOPY WITH PROPOFOL N/A 06/04/2017   Procedure: COLONOSCOPY WITH PROPOFOL;  Surgeon: WLucilla Lame MD;  [facility]  Service: Gastroenterology;  Laterality: N/A;  needs to remain first patient stated he also suppose to get EGD also   CORONARY ARTERY BYPASS GRAFT  01/05/2012   Procedure: CORONARY ARTERY BYPASS GRAFTING (CABG);  Surgeon: CRexene Alberts MD;  [place] [facility]  Service: Open Heart Surgery;  Laterality: N/A;  coronary artery bypass graft times five using left internal mammary artery and right leg greater saphenous vein harvested endoscopically    ESOPHAGOGASTRODUODENOSCOPY N/A 06/04/2017   PT NEEDS REPEAT IN 06/2020/BARRETT'S ESOPHAGUS - Surgeon: WLucilla Lame MD   GIVENS CAPSULE STUDY N/A 06/22/2017   Procedure: GIVENS CAPSULE STUDY;  Surgeon: WLucilla Lame MD;  [facility]  Service: Endoscopy;  Laterality: N/A;   LEFT HEART CATHETERIZATION WITH CORONARY ANGIOGRAM N/A 01/02/2012   Procedure: LEFT HEART CATHETERIZATION WITH CORONARY  ANGIOGRAM;  Surgeon: Troy Sine, MD;  [facility];  Service: Cardiovascular;  Laterality: N/A;   POLYPECTOMY N/A 06/04/2017   Procedure: POLYPECTOMY;  Surgeon: [name], MD;  Location: [place];  Service: Gastroenterology;  Laterality: N/A;   POLYSOMNOGRAPHY  07/2013   +severe OSA; CPAP at 10cm pressure recommended.   rotator cuff curgery  2011   lt rotator cuff   Shoulder surgery  1997   bilateral   WISDOM TOOTH EXTRACTION     Social History   Socioeconomic History   Marital status: Divorced    Spouse name: Not on file   Number of  children: 1   Years of education: 12   Highest education level: Not on file  Occupational History   Occupation: Printmaker: [employer]    Comment: x 30 yrs  Tobacco Use   Smoking status: Former    Packs/day: 1.50    Years: 35.00    Total pack years: 52.50    Types: Cigarettes    Quit date: 11/14/2011    Years since quitting: 10.7   Smokeless tobacco: Never   Tobacco comments:    quit 2 weeks prior to CABG 12/2011  Vaping Use   Vaping Use: Never used  Substance and Sexual Activity   Alcohol use: Yes    Alcohol/week: 1.0 standard drink of alcohol    Types: 1 Cans of beer per week    Comment: Drinks once per month; beer.   Drug use: No   Sexual activity: Yes    Partners: Female    Birth control/protection: Condom  Other Topics Concern   Not on file  Social History Narrative   Marital status: divorced since 1996; dating casually in 2016.        Lives: Lives alone.          Children:  One son and a granddaughter 72 years old live in [place].       Tobacco: quit with CABG      Alcohol: weekends; 6-8 beers on average.  Beers with football; ten on football weekends.      Exercise: Light, yard work.        Employment:  Works 45-50 hrs. Some weeks.  [employer].  Desk work since 05/2013. Since 1979.      Guns:  Guns in the home not stored in locked cabinet. Smoke alarm in home, Always wears seatbelts.       Sexual History: active;Last HIV testing 2015; has has 25+ partners; females only.            Social Determinants of Health   Financial Resource Strain: Not on file  Food Insecurity: Not on file  Transportation Needs: Not on file  Physical Activity: Not on file  Stress: Not on file  Social Connections: Not on file  Intimate Partner Violence: Not on file   Family Status  Relation Name Status   Mother  Deceased at age 7       lung cancer; Defibrillator; age 42.   Father  Deceased at age 73       COPD   Sister  Alive   MGM  Deceased   MGF   Deceased   PGM  Deceased   PGF  Deceased   Sister  Comptroller   Sister  Alive   Brother  Alive   Brother  Alive   Brother  Alive   Son  Alive   Family History  Problem Relation  Age of Onset   Heart attack Mother        defribillator in place   Hypertension Mother    Hyperlipidemia Mother    Heart disease Mother        CHF with defibrillator;  CAD.   Diabetes Mother    Cancer Mother 32       Lung cancer   Hypertension Father    Hyperlipidemia Father    Cancer Father 33        prostate cancer s/p  implants   COPD Father    Hypertension Sister    Heart disease Maternal Grandmother    Heart disease Maternal Grandfather    Cancer Paternal Grandmother    Cancer Paternal Grandfather    Hypertension Sister    Allergies  Allergen Reactions   Tramadol Rash    Patient Care Team: Gwyneth Sprout, FNP as PCP - General (Family Medicine) Terance Ice, MD (Inactive) as Consulting Physician (Cardiology) [name], MD as Consulting Physician (Family Medicine) Christene Lye, MD (General Surgery) Cristy Friedlander, MD (Oral Surgery) Germaine Pomfret, [facility] (Pharmacist) Cleaster Corin, OD (Optometry)   Medications: Outpatient Medications Prior to Visit  Medication Sig   atorvastatin (LIPITOR) 40 MG tablet Take 1 tablet (40 mg total) by mouth daily.   clonazePAM (KLONOPIN) 0.5 MG tablet TAKE 1 TABLET(0.5 MG) BY MOUTH DAILY   dapagliflozin propanediol (FARXIGA) 10 MG TABS tablet Take 1 tablet (10 mg total) by mouth daily.   fenofibrate (TRICOR) 145 MG tablet Take 1 tablet (145 mg total) by mouth daily.   ferrous sulfate 325 (65 FE) MG EC tablet Take 1 tablet (325 mg total) by mouth 2 (two) times daily with breakfast and lunch.   glipiZIDE (GLUCOTROL XL) 10 MG 24 hr tablet Take 1 tablet (10 mg total) by mouth daily with breakfast.   hydrOXYzine (VISTARIL) 25 MG capsule Take 1 capsule (25 mg total) by mouth every 8 (eight) hours as needed.   metFORMIN (GLUCOPHAGE) 1000  MG tablet Take 1 tablet (1,000 mg total) by mouth 2 (two) times daily with a meal.   metoprolol tartrate (LOPRESSOR) 25 MG tablet TAKE 1 TABLET(25 MG) BY MOUTH TWICE DAILY   omeprazole (PRILOSEC) 40 MG capsule TAKE 1 CAPSULE(40 MG) BY MOUTH DAILY   Ruxolitinib Phosphate (OPZELURA) 1.5 % CREA Apply bid to affected areas of rash qd   sildenafil (REVATIO) 20 MG tablet 3-5 tablets 1 hour prior to intercourse   tamsulosin (FLOMAX) 0.4 MG CAPS capsule Take 1 capsule (0.4 mg total) by mouth daily.   triamcinolone ointment (KENALOG) 0.1 % Apply 1 Application topically 2 (two) times daily.   venlafaxine XR (EFFEXOR-XR) 150 MG 24 hr capsule TAKE 1 CAPSULE(150 MG) BY MOUTH DAILY WITH BREAKFAST   No facility-administered medications prior to visit.    Review of Systems   Objective    BP 127/76 (BP Location: Right Arm, Patient Position: Sitting, Cuff Size: Normal)   Pulse 75   Resp 16   Ht _0  (1.778 m)   Wt 156 lb (70.8 kg)   SpO2 98%   BMI 22.38 kg/m   Physical Exam Vitals and nursing note reviewed.  Constitutional:      General: He is awake. He is not in acute distress.    Appearance: Normal appearance. He is well-developed, well-groomed and normal weight. He is not ill-appearing, toxic-appearing or diaphoretic.  HENT:     Head: Normocephalic and atraumatic.     Jaw: There is normal jaw  occlusion. No trismus, tenderness, swelling or pain on movement.     Salivary Glands: Right salivary gland is not diffusely enlarged or tender. Left salivary gland is not diffusely enlarged or tender.     Right Ear: Hearing, tympanic membrane, ear canal and external ear normal. There is no impacted cerumen.     Left Ear: Hearing, tympanic membrane, ear canal and external ear normal. There is no impacted cerumen.     Nose: Nose normal. No congestion or rhinorrhea.     Right Turbinates: Not enlarged, swollen or pale.     Left Turbinates: Not enlarged, swollen or pale.     Right Sinus: No maxillary sinus  tenderness or frontal sinus tenderness.     Left Sinus: No maxillary sinus tenderness or frontal sinus tenderness.     Mouth/Throat:     Lips: Pink.     Mouth: Mucous membranes are moist. No injury, lacerations, oral lesions or angioedema.     Pharynx: Oropharynx is clear. Uvula midline. No pharyngeal swelling, oropharyngeal exudate or posterior oropharyngeal erythema.     Tonsils: No tonsillar exudate or tonsillar abscesses.  Eyes:     General: Lids are normal. Vision grossly intact. Gaze aligned appropriately.        Right eye: No discharge.        Left eye: No discharge.     Extraocular Movements: Extraocular movements intact.     Conjunctiva/sclera: Conjunctivae normal.     Pupils: Pupils are equal, round, and reactive to light.  Neck:     Thyroid: No thyroid mass, thyromegaly or thyroid tenderness.     Vascular: No carotid bruit.     Trachea: Trachea normal. No tracheal tenderness.  Cardiovascular:     Rate and Rhythm: Normal rate and regular rhythm.     Pulses: Normal pulses.          Carotid pulses are 2+ on the right side and 2+ on the left side.      Radial pulses are 2+ on the right side and 2+ on the left side.       Femoral pulses are 2+ on the right side and 2+ on the left side.      Popliteal pulses are 2+ on the right side and 2+ on the left side.       Dorsalis pedis pulses are 2+ on the right side and 2+ on the left side.       Posterior tibial pulses are 2+ on the right side and 2+ on the left side.     Heart sounds: Normal heart sounds, S1 normal and S2 normal. No murmur heard.    No friction rub. No gallop.  Pulmonary:     Effort: Pulmonary effort is normal. No respiratory distress.     Breath sounds: Normal breath sounds and air entry. No stridor. No wheezing, rhonchi or rales.  Chest:     Chest wall: No tenderness.  Abdominal:     General: Abdomen is flat. Bowel sounds are normal. There is no distension.     Palpations: Abdomen is soft. There is no mass.      Tenderness: There is no abdominal tenderness. There is no guarding or rebound.     Hernia: No hernia is present.  Genitourinary:    Comments: Exam deferred; denies complaints Musculoskeletal:        General: No swelling, tenderness, deformity or signs of injury. Normal range of motion.     Cervical back: Normal range of motion  and neck supple. No rigidity or tenderness.     Right lower leg: No edema.     Left lower leg: No edema.  Lymphadenopathy:     Cervical: No cervical adenopathy.     Right cervical: No superficial, deep or posterior cervical adenopathy.    Left cervical: No superficial, deep or posterior cervical adenopathy.  Skin:    General: Skin is warm and dry.     Capillary Refill: Capillary refill takes less than 2 seconds.     Coloration: Skin is not jaundiced or pale.     Findings: No bruising, erythema, lesion or rash.  Neurological:     General: No focal deficit present.     Mental Status: He is alert and oriented to person, place, and time. Mental status is at baseline.     GCS: GCS eye subscore is 4. GCS verbal subscore is 5. GCS motor subscore is 6.     Sensory: Sensation is intact. No sensory deficit.     Motor: Motor function is intact. No weakness.     Coordination: Coordination is intact.     Gait: Gait is intact.  Psychiatric:        Attention and Perception: Attention and perception normal.        Mood and Affect: Mood and affect normal.        Speech: Speech normal.        Behavior: Behavior normal. Behavior is cooperative.        Thought Content: Thought content normal.        Cognition and Memory: Cognition normal.        Judgment: Judgment normal.     Last depression screening scores    08/26/2022    3:43 PM 04/15/2022    4:31 PM 08/07/2021    2:28 PM  PHQ 2/9 Scores  PHQ - 2 Score _0 PHQ- 9 Score _1 Last fall risk screening    08/26/2022    3:43 PM  [place] in the past year? 0  Number falls in past yr: 0  Injury with  Fall? 0  Risk for fall due to : No Fall Risks  Follow up Falls evaluation completed   Last Audit-C alcohol use screening    08/26/2022    3:43 PM  Alcohol Use Disorder Test (AUDIT)  1. How often do you have a drink containing alcohol? 2  2. How many drinks containing alcohol do you have on a typical day when you are drinking? 1  3. How often do you have six or more drinks on one occasion? 1  AUDIT-C Score 4   A score of 3 or more in women, and 4 or more in men indicates increased risk for alcohol abuse, EXCEPT if all of the points are from question 1   No results found for any visits on 08/26/22.  Assessment & Plan    Routine Health Maintenance and Physical Exam  Exercise Activities and Dietary recommendations  Goals   None     Immunization History  Administered Date(s) Administered   COVID-19, mRNA, vaccine(Comirnaty)12 years and older 08/26/2022   Hepatitis B, adult 06/18/2015, 10/05/2015   PFIZER(Purple Top)SARS-COV-2 Vaccination 12/15/2019, 01/05/2020, 08/01/2020   Pneumococcal Conjugate-13 08/14/2014   Pneumococcal Polysaccharide-23 06/18/2015   Pneumococcal-Unspecified 09/15/2004   Td 09/16/2007   Tdap 04/04/2019    Health Maintenance  Topic Date Due   Lung Cancer Screening  Never done   Diabetic  kidney evaluation - Urine ACR  04/23/2021   OPHTHALMOLOGY EXAM  12/24/2021   FOOT EXAM  02/01/2022   HEMOGLOBIN A1C  07/16/2022   Diabetic kidney evaluation - eGFR measurement  08/07/2022   Zoster Vaccines- Shingrix (1 of 2) 11/25/2022 (Originally 06/12/1980)   INFLUENZA VACCINE  12/14/2022 (Originally 04/15/2022)   COLONOSCOPY (Pts 45-83yr Insurance coverage will need to be confirmed)  08/27/2023 (Originally 06/04/2022)   COVID-19 Vaccine (5 - 2023-24 season) 10/21/2022   DTaP/Tdap/Td (3 - Td or Tdap) 04/03/2029   Hepatitis C Screening  Completed   HIV Screening  Completed   HPV VACCINES  Aged Out    Discussed health benefits of physical activity, and encouraged  him to engage in regular exercise appropriate for his age and condition.  Problem List Items Addressed This Visit       Endocrine   Diabetes mellitus ([place]    Chronic, previously elevated Will send for eye eam Repeat A1c Repeat Urine micro Continue to recommend balanced, lower carb meals. Smaller meal size, adding snacks. Choosing water as drink of choice and increasing purposeful exercise. Continue farxiga 10 mg and 1000 mg metformin BID      Relevant Orders   Hemoglobin A1c   Urine Microalbumin w/creat. ratio   Hyperlipidemia associated with type 2 diabetes mellitus (HCC)    Chronic, previously stable Repeat LP Goal <55 Continue fenofibrate 145 mcg; Continue Lipitor 40 mg       Relevant Orders   Lipid panel     Other   Annual physical exam - Primary    UTD on vision Due for dental Due for lung cancer screening Things to do to keep yourself healthy  - Exercise at least 30-45 minutes a day, 3-4 days a week.  - Eat a low-fat diet with lots of fruits and vegetables, up to 7-9 servings per day.  - Seatbelts can save your life. Wear them always.  - Smoke detectors on every level of your home, check batteries every year.  - Eye Doctor - have an eye exam every 1-2 years  - Safe sex - if you may be exposed to STDs, use a condom.  - Alcohol -  If you drink, do it moderately, less than 2 drinks per day.  - [place] Choose someone to speak for you if you are not able.  - Depression is common in our stressful world.If you're feeling down or losing interest in things you normally enjoy, please come in for a visit.  - Violence - If anyone is threatening or hurting you, please call immediately.       Relevant Orders   Comprehensive Metabolic Panel (CMET)   CBC   Hemoglobin A1c   PSA   Urine Microalbumin w/creat. ratio   BPH associated with nocturia    Chronic, stable Repeat PSA Continue flomax 0.4 and revatio 60-100 mg PRN       Relevant Orders    PSA   Depression with anxiety    Chronic, stable Continue Effexor 150 mg qd  Also on low dose Klonopin 0.5 mg to assist  Contracted for safety; denies SI or HI      Encounter for screening for lung cancer    Hx of tobacco; due for low dose CT scan       Relevant Orders   CT CHEST LUNG CA SCREEN LOW DOSE W/O CM   Iron deficiency anemia due to chronic blood loss    Chronic, previous stale Repeat  CBC Denies fatigue; endorses OA in hands- plans to start Tylenol Arthritis       Relevant Orders   CBC   Other Visit Diagnoses     Need for COVID-19 vaccine       Relevant Orders   Pfizer Fall 2023 Covid-19 Vaccine 80yr and older (Completed)      Return in about 6 months (around 02/25/2023) for chonic disease management.    IVonna Kotyk FNP, have reviewed all documentation for this visit. The documentation on 08/26/22 for the exam, diagnosis, procedures, and orders are all accurate and complete.  EGwyneth Sprout [phone](phone) [phone](fax)  [place]

## 2022-08-26 NOTE — Assessment & Plan Note (Signed)
Hx of tobacco; due for low dose CT scan

## 2022-08-26 NOTE — Assessment & Plan Note (Signed)
Chronic, previous stale Repeat CBC Denies fatigue; endorses OA in hands- plans to start Tylenol Arthritis

## 2022-08-26 NOTE — Assessment & Plan Note (Signed)
UTD on vision Due for dental Due for lung cancer screening Things to do to keep yourself healthy  - Exercise at least 30-45 minutes a day, 3-4 days a week.  - Eat a low-fat diet with lots of fruits and vegetables, up to 7-9 servings per day.  - Seatbelts can save your life. Wear them always.  - Smoke detectors on every level of your home, check batteries every year.  - Eye Doctor - have an eye exam every 1-2 years  - Safe sex - if you may be exposed to STDs, use a condom.  - Alcohol -  If you drink, do it moderately, less than 2 drinks per day.  - [place]. Choose someone to speak for you if you are not able.  - Depression is common in our stressful world.If you're feeling down or losing interest in things you normally enjoy, please come in for a visit.  - Violence - If anyone is threatening or hurting you, please call immediately.

## 2022-08-27 LAB — CBC
Hematocrit: 40.2 % (ref 37.5–51.0)
Hemoglobin: 13.3 g/dL (ref 13.0–17.7)
MCH: 29.4 pg (ref 26.6–33.0)
MCHC: 33.1 g/dL (ref 31.5–35.7)
MCV: 89 fL (ref 79–97)
Platelets: 341 10*3/uL (ref 150–450)
RBC: 4.53 x10E6/uL (ref 4.14–5.80)
RDW: 14 % (ref 11.6–15.4)
WBC: 7.3 10*3/uL (ref 3.4–10.8)

## 2022-08-27 LAB — COMPREHENSIVE METABOLIC PANEL
ALT: 27 IU/L (ref 0–44)
AST: 23 IU/L (ref 0–40)
Albumin/Globulin Ratio: 2.4 — ABNORMAL HIGH (ref 1.2–2.2)
Albumin: 4.8 g/dL (ref 3.9–4.9)
Alkaline Phosphatase: 83 IU/L (ref 44–121)
BUN/Creatinine Ratio: 18 (ref 10–24)
BUN: 16 mg/dL (ref 8–27)
Bilirubin Total: <0.2 mg/dL (ref 0.0–1.2)
CO2: 22 mmol/L (ref 20–29)
Calcium: 9.9 mg/dL (ref 8.6–10.2)
Chloride: 102 mmol/L (ref 96–106)
Creatinine, Ser: 0.89 mg/dL (ref 0.76–1.27)
Globulin, Total: 2 g/dL (ref 1.5–4.5)
Glucose: 164 mg/dL — ABNORMAL HIGH (ref 70–99)
Potassium: 4.2 mmol/L (ref 3.5–5.2)
Sodium: 140 mmol/L (ref 134–144)
Total Protein: 6.8 g/dL (ref 6.0–8.5)
eGFR: 96 mL/min/{1.73_m2} (ref >59)

## 2022-08-27 LAB — LIPID PANEL
Chol/HDL Ratio: 3.8 ratio (ref 0.0–5.0)
Cholesterol, Total: 159 mg/dL (ref 100–199)
HDL: 42 mg/dL (ref >39)
LDL Chol Calc (NIH): 80 mg/dL (ref 0–99)
Triglycerides: 220 mg/dL — ABNORMAL HIGH (ref 0–149)
VLDL Cholesterol Cal: 37 mg/dL (ref 5–40)

## 2022-08-27 LAB — MICROALBUMIN / CREATININE URINE RATIO
Creatinine, Urine: 86.4 mg/dL
Microalb/Creat Ratio: <3 mg/g creat (ref 0–29)
Microalbumin, Urine: <3 ug/mL

## 2022-08-27 LAB — PSA: Prostate Specific Ag, Serum: 0.5 ng/mL (ref 0.0–4.0)

## 2022-08-27 LAB — HEMOGLOBIN A1C
Est. average glucose Bld gHb Est-mCnc: 174 mg/dL
Hgb A1c MFr Bld: 7.7 % — ABNORMAL HIGH (ref 4.8–5.6)

## 2022-08-27 NOTE — Progress Notes (Signed)
Normal urine micro albumin; repeat annually  Gwyneth Sprout, [address] [place], [place] [address] [phone] (phone) [phone] (fax) [place]

## 2022-08-27 NOTE — Progress Notes (Signed)
Cholesterol shows elevation in total cholesterol, and bad/LDL cholesterol. I continue to recommend diet low in saturated fat and regular exercise - 30 min at least 5 times per week. If you have been taking Lipitor 40 mg daily; recommend increase to Crestor 40 mg to further assist. Ensure you are also taking fenofibrate to assist fats/triglycerides.  Diabetes is elevated; now at 7.7%. Continue to recommend balanced, lower carb meals. Smaller meal size, adding snacks. Choosing water as drink of choice and increasing purposeful exercise. Ensure you are taking farxiga 10 mg, glipizide 10 mg, metformin 1000 mg twice daily.   Urine pending.  You will be contacted for low dose CT for lung cancer screening. Continue to schedule eye and dental exams as due.  Take care, Joseph Terry, [address] [place], [place] [address] [phone] (phone) [phone] (fax) [place]

## 2022-08-31 ENCOUNTER — Encounter: Payer: Self-pay | Admitting: Dermatology

## 2022-09-04 ENCOUNTER — Ambulatory Visit (INDEPENDENT_AMBULATORY_CARE_PROVIDER_SITE_OTHER): Payer: BC Managed Care – PPO | Admitting: Dermatology

## 2022-09-04 DIAGNOSIS — L300 Nummular dermatitis: Secondary | ICD-10-CM | POA: Diagnosis not present

## 2022-09-04 DIAGNOSIS — Z79899 Other long term (current) drug therapy: Secondary | ICD-10-CM

## 2022-09-04 DIAGNOSIS — Z7189 Other specified counseling: Secondary | ICD-10-CM | POA: Diagnosis not present

## 2022-09-04 DIAGNOSIS — R21 Rash and other nonspecific skin eruption: Secondary | ICD-10-CM

## 2022-09-04 DIAGNOSIS — L2089 Other atopic dermatitis: Secondary | ICD-10-CM

## 2022-09-04 MED ORDER — DUPIXENT 300 MG/2ML ~~LOC~~ SOSY: 300.0000 mg | PREFILLED_SYRINGE | SUBCUTANEOUS | 0 refills | Status: DC

## 2022-09-04 MED ORDER — TRIAMCINOLONE ACETONIDE 0.1 % EX CREA: TOPICAL_CREAM | CUTANEOUS | 0 refills | Status: DC

## 2022-09-04 NOTE — Progress Notes (Signed)
Follow-Up Visit   Subjective  Joseph Terry is a 61 y.o. male who presents for the following: Rash (Flared today at chest, back, neck, and arms. ). The patient has spots, moles and lesions to be evaluated, some may be new or changing and the patient has concerns that these could be cancer.  The following portions of the chart were reviewed this encounter and updated as appropriate:  Tobacco  Allergies  Meds  Problems  Med Hx  Surg Hx  Fam Hx     Review of Systems: No other skin or systemic complaints except as noted in HPI or Assessment and Plan.  Objective  Well appearing patient in no apparent distress; mood and affect are within normal limits.  A focused examination was performed including face, neck, back, chest, arms. Relevant physical exam findings are noted in the Assessment and Plan.   Assessment & Plan  Rash and other nonspecific skin eruption back, chest, neck, arms; Left Lower Back Eczema/atopic dermatitis with history of contact dermatitis causing flare Severe. Chronic and persistent condition with duration or expected duration over one year. Condition is symptomatic / bothersome to patient. Not to goal.  Continue opzelura 1.5 % cream apply bid to affected areas of rash daily Rx sent to [facility] Can continue hydroxyzine prescription if needed for itch Start triamcinolone cream 0.1 % - apply topically to aa of rash at body daily up to 5 days a week as needed. Avoid applying to face, groin, and axilla. Use as directed. Long-term use can cause thinning of the skin.  Topical steroids (such as triamcinolone, fluocinolone, fluocinonide, mometasone, clobetasol, halobetasol, betamethasone, hydrocortisone) can cause thinning and lightening of the skin if they are used for too long in the same area. Your physician has selected the right strength medicine for your problem and area affected on the body. Please use your medication only as directed by your physician to prevent  side effects.   Dupixent injection today into left and right upper arm. Patient tolerated well with no adverse reactions  Dupilumab (Dupixent) is a treatment given by injection for adults and children with moderate-to-severe atopic dermatitis. Goal is control of skin condition, not cure. It is given as 2 injections at the first dose followed by 1 injection ever 2 weeks thereafter.  Young children are dosed monthly.  Potential side effects include allergic reaction, herpes infections, injection site reactions and conjunctivitis (inflammation of the eyes).  The use of Dupixent requires long term medication management, including periodic office visits. Dupixent  NDC 1884-1660-63 Lot [id number] Exp 11/2024  triamcinolone cream (KENALOG) 0.1 % - Left Lower Back, back, chest, neck, arms Apply to aa of rash at body daily. Avoid applying to face, groin, and axilla. Use as directed.  Skin / nail biopsy - Left Lower Back Type of biopsy: punch   Informed consent: discussed and consent obtained   Timeout: patient name, date of birth, surgical site, and procedure verified   Procedure prep:  Patient was prepped and draped in usual sterile fashion (the patient was cleaned and prepped) Prep type:  Isopropyl alcohol Anesthesia: the lesion was anesthetized in a standard fashion   Anesthetic:  1% lidocaine w/ epinephrine 1-100,000 buffered w/ 8.4% NaHCO3 Punch size:  3 mm Suture size:  4-0 Suture type: nylon   Hemostasis achieved with: suture, pressure and aluminum chloride   Outcome: patient tolerated procedure well   Post-procedure details: sterile dressing applied and wound care instructions given   Dressing type: bandage, petrolatum and  pressure dressing    Specimen 1 - Surgical pathology Differential Diagnosis:  eczema/atopica dermatitis vs contact dermatitis Check Margins: No  Related Medications Ruxolitinib Phosphate (OPZELURA) 1.5 % CREA Apply bid to affected areas of rash qd dupilumab  (DUPIXENT) 300 MG/2ML prefilled syringe Inject 300 mg into the skin every 14 (fourteen) days. Starting at day 15 for maintenance.  Return in about 2 weeks (around 09/18/2022) for suture removal and rash follow up.  [name], CMA, am acting as scribe for Sarina Ser, MD. Documentation: I have reviewed the above documentation for accuracy and completeness, and I agree with the above.  Sarina Ser, MD

## 2022-09-04 NOTE — Patient Instructions (Addendum)
For Rash  Continue Opzelura cream - apply to affected itchy areas twice daily as needed for rash  Start triamcinolone cream 0.1 % - apply to affected areas of rash at body daily. Avoid applying to face, groin, and axilla. Use as directed. Long-term use can cause thinning of the skin.  Topical steroids (such as triamcinolone, fluocinolone, fluocinonide, mometasone, clobetasol, halobetasol, betamethasone, hydrocortisone) can cause thinning and lightening of the skin if they are used for too long in the same area. Your physician has selected the right strength medicine for your problem and area affected on the body. Please use your medication only as directed by your physician to prevent side effects.   Dupilumab (Dupixent) is a treatment given by injection for adults and children with moderate-to-severe atopic dermatitis. Goal is control of skin condition, not cure. It is given as 2 injections at the first dose followed by 1 injection ever 2 weeks thereafter.  Young children are dosed monthly.  Potential side effects include allergic reaction, herpes infections, injection site reactions and conjunctivitis (inflammation of the eyes).  The use of Dupixent requires long term medication management, including periodic office visits.   Biopsy Wound Care Instructions  Leave the original bandage on for 24 hours if possible.  If the bandage becomes soaked or soiled before that time, it is OK to remove it and examine the wound.  A small amount of post-operative bleeding is normal.  If excessive bleeding occurs, remove the bandage, place gauze over the site and apply continuous pressure (no peeking) over the area for 30 minutes. If this does not work, please call our clinic as soon as possible or page your doctor if it is after hours.   Once a day, cleanse the wound with soap and water. It is fine to shower. If a thick crust develops you may use a Q-tip dipped into dilute hydrogen peroxide (mix 1:1 with water)  to dissolve it.  Hydrogen peroxide can slow the healing process, so use it only as needed.    After washing, apply petroleum jelly (Vaseline) or an antibiotic ointment if your doctor prescribed one for you, followed by a bandage.    For best healing, the wound should be covered with a layer of ointment at all times. If you are not able to keep the area covered with a bandage to hold the ointment in place, this may mean re-applying the ointment several times a day.  Continue this wound care until the wound has healed and is no longer open.   Itching and mild discomfort is normal during the healing process. However, if you develop pain or severe itching, please call our office.   If you have any discomfort, you can take Tylenol (acetaminophen) or ibuprofen as directed on the bottle. (Please do not take these if you have an allergy to them or cannot take them for another reason).  Some redness, tenderness and white or yellow material in the wound is normal healing.  If the area becomes very sore and red, or develops a thick yellow-green material (pus), it may be infected; please notify us.    If you have stitches, return to clinic as directed to have the stitches removed. You will continue wound care for 2-3 days after the stitches are removed.   Wound healing continues for up to one year following surgery. It is not unusual to experience pain in the scar from time to time during the interval.  If the pain becomes severe or the scar  thickens, you should notify the office.    A slight amount of redness in a scar is expected for the first six months.  After six months, the redness will fade and the scar will soften and fade.  The color difference becomes less noticeable with time.  If there are any problems, return for a post-op surgery check at your earliest convenience.  To improve the appearance of the scar, you can use silicone scar gel, cream, or sheets (such as Mederma or Serica) every night for  up to one year. These are available over the counter (without a prescription).  Please call our office at [phone] for any questions or concerns.       Due to recent changes in healthcare laws, you may see results of your pathology and/or laboratory studies on MyChart before the doctors have had a chance to review them. We understand that in some cases there may be results that are confusing or concerning to you. Please understand that not all results are received at the same time and often the doctors may need to interpret multiple results in order to provide you with the best plan of care or course of treatment. Therefore, we ask that you please give [place] 2 business days to thoroughly review all your results before contacting the office for clarification. Should we see a critical lab result, you will be contacted sooner.   If You Need Anything After Your Visit  If you have any questions or concerns for your doctor, please call our main line at [phone] and press option 4 to reach your doctor's medical assistant. If no one answers, please leave a voicemail as directed and we will return your call as soon as possible. Messages left after 4 pm will be answered the following business day.   You may also send [place] a message via [place]. We typically respond to MyChart messages within 1-2 business days.  For prescription refills, please ask your pharmacy to contact our office. Our fax number is [phone].  If you have an urgent issue when the clinic is closed that cannot wait until the next business day, you can page your doctor at the number below.    Please note that while we do our best to be available for urgent issues outside of office hours, we are not available 24/7.   If you have an urgent issue and are unable to reach [place], you may choose to seek medical care at your doctor's office, retail clinic, urgent care center, or emergency room.  If you have a medical emergency, please  immediately call 911 or go to the emergency department.  Pager Numbers  - Dr. Nehemiah Massed: [phone]  - Dr. Laurence Ferrari: [phone]  - Dr. Nicole Kindred: [phone]  In the event of inclement weather, please call our main line at [phone] for an update on the status of any delays or closures.  Dermatology Medication Tips: Please keep the boxes that topical medications come in in order to help keep track of the instructions about where and how to use these. Pharmacies typically print the medication instructions only on the boxes and not directly on the medication tubes.   If your medication is too expensive, please contact our office at [phone] option 4 or send [place] a message through [place].   We are unable to tell what your co-pay for medications will be in advance as this is different depending on your insurance coverage. However, we may be able to find a substitute medication at  lower cost or fill out paperwork to get insurance to cover a needed medication.   If a prior authorization is required to get your medication covered by your insurance company, please allow [place] 1-2 business days to complete this process.  Drug prices often vary depending on where the prescription is filled and some pharmacies may offer cheaper prices.  The website www.goodrx.com contains coupons for medications through different pharmacies. The prices here do not account for what the cost may be with help from insurance (it may be cheaper with your insurance), but the website can give you the price if you did not use any insurance.  - You can print the associated coupon and take it with your prescription to the pharmacy.  - You may also stop by our office during regular business hours and pick up a GoodRx coupon card.  - If you need your prescription sent electronically to a different pharmacy, notify our office through [facility] or by phone at [phone] option 4.     Si Usted Necesita Algo Despus  de Su Visita  Tambin puede enviarnos un mensaje a travs de Pharmacist, community. Por lo general respondemos a los mensajes de MyChart en el transcurso de 1 a 2 das hbiles.  Para renovar recetas, por favor pida a su farmacia que se ponga en contacto con nuestra oficina. Harland Dingwall de fax es [place] [phone].  Si tiene un asunto urgente cuando la clnica est cerrada y que no puede esperar hasta el siguiente da hbil, puede llamar/localizar a su doctor(a) al nmero que aparece a continuacin.   Por favor, tenga en cuenta que aunque hacemos todo lo posible para estar disponibles para asuntos urgentes fuera del horario de [place], no estamos disponibles las 24 horas del da, los 7 das de la [place].   Si tiene un problema urgente y no puede comunicarse con nosotros, puede optar por buscar atencin mdica  en el consultorio de su doctor(a), en una clnica privada, en un centro de atencin urgente o en una sala de emergencias.  Si tiene Engineering geologist, por favor llame inmediatamente al 911 o vaya a la sala de emergencias.  Nmeros de bper  - Dr. Nehemiah Massed: [phone]  - Dra. Moye: [phone]  - Dra. Nicole Kindred: [phone]  En caso de inclemencias del [place], por favor llame a Johnsie Kindred principal al [phone] para una actualizacin sobre el [place] de cualquier retraso o cierre.  Consejos para la medicacin en dermatologa: Por favor, guarde las cajas en las que vienen los medicamentos de uso tpico para ayudarle a seguir las instrucciones sobre dnde y cmo usarlos. Las farmacias generalmente imprimen las instrucciones del medicamento slo en las cajas y no directamente en los tubos del [place].   Si su medicamento es muy caro, por favor, pngase en contacto con Zigmund Daniel llamando al [phone] y presione la opcin 4 o envenos un mensaje a travs de Pharmacist, community.   No podemos decirle cul ser su copago por los medicamentos por adelantado ya que esto es diferente dependiendo  de la cobertura de su seguro. Sin embargo, es posible que podamos encontrar un medicamento sustituto a Electrical engineer un formulario para que el seguro cubra el medicamento que se considera necesario.   Si se requiere una autorizacin previa para que su compaa de seguros [place] su medicamento, por favor permtanos de 1 a 2 das hbiles para completar este proceso.  Los precios de los medicamentos varan con frecuencia dependiendo del Environmental consultant de dnde se  surte la receta y alguna farmacias pueden ofrecer precios ms baratos.  El sitio web www.goodrx.com tiene cupones para medicamentos de Airline pilot. Los precios aqu no tienen en cuenta lo que podra costar con la ayuda del seguro (puede ser ms barato con su seguro), pero el sitio web puede darle el precio si no utiliz Research scientist (physical sciences).  - Puede imprimir el cupn correspondiente y llevarlo con su receta a la farmacia.  - Tambin puede pasar por nuestra oficina durante el horario de atencin regular y Charity fundraiser una tarjeta de cupones de GoodRx.  - Si necesita que su receta se enve electrnicamente a una farmacia diferente, informe a nuestra oficina a travs de MyChart de [place] o por telfono llamando al [phone] y presione la opcin 4.

## 2022-09-13 ENCOUNTER — Encounter: Payer: Self-pay | Admitting: Dermatology

## 2022-09-16 ENCOUNTER — Other Ambulatory Visit: Payer: Self-pay | Admitting: Family Medicine

## 2022-09-16 DIAGNOSIS — F418 Other specified anxiety disorders: Secondary | ICD-10-CM

## 2022-09-18 ENCOUNTER — Ambulatory Visit (INDEPENDENT_AMBULATORY_CARE_PROVIDER_SITE_OTHER): Payer: BC Managed Care – PPO | Admitting: Dermatology

## 2022-09-18 DIAGNOSIS — R21 Rash and other nonspecific skin eruption: Secondary | ICD-10-CM | POA: Diagnosis not present

## 2022-09-18 DIAGNOSIS — Z79899 Other long term (current) drug therapy: Secondary | ICD-10-CM

## 2022-09-18 DIAGNOSIS — L209 Atopic dermatitis, unspecified: Secondary | ICD-10-CM

## 2022-09-18 MED ORDER — DUPIXENT 300 MG/2ML ~~LOC~~ SOSY: 300.0000 mg | PREFILLED_SYRINGE | SUBCUTANEOUS | 0 refills | Status: DC

## 2022-09-18 NOTE — Progress Notes (Signed)
   Follow-Up Visit   Subjective  Joseph Terry is a 62 y.o. male who presents for the following: rash follow up and suture removal (Patient is here today for suture removal of punch biopsy on the L upper back. Rash has much improved since Dupixent loading dose. He is also using TMC 0.1% ointment to aa's and Opzelura cream QD.).  The following portions of the chart were reviewed this encounter and updated as appropriate:   Tobacco  Allergies  Meds  Problems  Med Hx  Surg Hx  Fam Hx     Review of Systems:  No other skin or systemic complaints except as noted in HPI or Assessment and Plan.  Objective  Well appearing patient in no apparent distress; mood and affect are within normal limits.  A focused examination was performed including the face, trunk, and extremities. Relevant physical exam findings are noted in the Assessment and Plan.   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   Recommend patch testing at follow up visit in 4 weeks. Patient to bring MSDS sheet on [employer] chemical that he works with. If we do patch testing patient will need to be off of [employer] for one month.   Continue TMC 0.1% cream to aa's BID PRN. Topical steroids (such as triamcinolone, fluocinolone, fluocinonide, mometasone, clobetasol, halobetasol, betamethasone, hydrocortisone) can cause thinning and lightening of the skin if they are used for too long in the same area. Your physician has selected the right strength medicine for your problem and area affected on the body. Please use your medication only as directed by your physician to prevent side effects.   Continue Opzelura cream to aa's QD.   Continue Dupixent 300 mg/2 mL SQ QOW. Dupixent 300 mg/31m injected SQ into the L  upper arm post. Patient tolerated injection well.   Encounter for Removal of Sutures - Incision site at the L upper back is clean, dry and intact - Wound cleansed, sutures removed, wound cleansed and steri strips applied.  - Discussed pathology results showing atopic dermatitis vs contact dermatitis  - Patient advised to keep steri-strips dry until they fall off. - Scars remodel for a full year. - Once steri-strips fall off, patient can apply over-the-counter silicone scar cream each night to help with scar remodeling if desired. - Patient advised to call with any concerns or if they notice any new or changing lesions.  dupilumab (DUPIXENT) 300 MG/2ML prefilled syringe - Trunk, extremities Inject 300 mg into the skin every 14 (fourteen) days. Starting at day 15 for maintenance.  Return in about 2 weeks (around 10/02/2022) for AD follow up, Dupixent injection.  ILuther Redo CMA, am acting as scribe for DSarina Ser MD . Documentation: I have reviewed the above documentation for accuracy and completeness, and I agree with the above.  DSarina Ser MD

## 2022-09-18 NOTE — Patient Instructions (Signed)
Due to recent changes in healthcare laws, you may see results of your pathology and/or laboratory studies on MyChart before the doctors have had a chance to review them. We understand that in some cases there may be results that are confusing or concerning to you. Please understand that not all results are received at the same time and often the doctors may need to interpret multiple results in order to provide you with the best plan of care or course of treatment. Therefore, we ask that you please give us 2 business days to thoroughly review all your results before contacting the office for clarification. Should we see a critical lab result, you will be contacted sooner.   If You Need Anything After Your Visit  If you have any questions or concerns for your doctor, please call our main line at [phone] and press option 4 to reach your doctor's medical assistant. If no one answers, please leave a voicemail as directed and we will return your call as soon as possible. Messages left after 4 pm will be answered the following business day.   You may also send us a message via MyChart. We typically respond to MyChart messages within 1-2 business days.  For prescription refills, please ask your pharmacy to contact our office. Our fax number is [phone].  If you have an urgent issue when the clinic is closed that cannot wait until the next business day, you can page your doctor at the number below.    Please note that while we do our best to be available for urgent issues outside of office hours, we are not available 24/7.   If you have an urgent issue and are unable to reach us, you may choose to seek medical care at your doctor's office, retail clinic, urgent care center, or emergency room.  If you have a medical emergency, please immediately call 911 or go to the emergency department.  Pager Numbers  - Dr. Kowalski: [phone]  - Dr. Moye: [phone]  - Dr. Stewart:  [phone]  In the event of inclement weather, please call our main line at [phone] for an update on the status of any delays or closures.  Dermatology Medication Tips: Please keep the boxes that topical medications come in in order to help keep track of the instructions about where and how to use these. Pharmacies typically print the medication instructions only on the boxes and not directly on the medication tubes.   If your medication is too expensive, please contact our office at [phone] option 4 or send us a message through MyChart.   We are unable to tell what your co-pay for medications will be in advance as this is different depending on your insurance coverage. However, we may be able to find a substitute medication at lower cost or fill out paperwork to get insurance to cover a needed medication.   If a prior authorization is required to get your medication covered by your insurance company, please allow us 1-2 business days to complete this process.  Drug prices often vary depending on where the prescription is filled and some pharmacies may offer cheaper prices.  The website www.goodrx.com contains coupons for medications through different pharmacies. The prices here do not account for what the cost may be with help from insurance (it may be cheaper with your insurance), but the website can give you the price if you did not use any insurance.  - You can print the associated coupon and take it with   your prescription to the pharmacy.  - You may also stop by our office during regular business hours and pick up a GoodRx coupon card.  - If you need your prescription sent electronically to a different pharmacy, notify our office through [facility] MyChart or by phone at [phone] option 4.     Si Usted Necesita Algo Despus de Su Visita  Tambin puede enviarnos un mensaje a travs de MyChart. Por lo general respondemos a los mensajes de MyChart en el transcurso de 1 a 2  das hbiles.  Para renovar recetas, por favor pida a su farmacia que se ponga en contacto con nuestra oficina. Nuestro nmero de fax es el [phone].  Si tiene un asunto urgente cuando la clnica est cerrada y que no puede esperar hasta el siguiente da hbil, puede llamar/localizar a su doctor(a) al nmero que aparece a continuacin.   Por favor, tenga en cuenta que aunque hacemos todo lo posible para estar disponibles para asuntos urgentes fuera del horario de oficina, no estamos disponibles las 24 horas del da, los 7 das de la semana.   Si tiene un problema urgente y no puede comunicarse con nosotros, puede optar por buscar atencin mdica  en el consultorio de su doctor(a), en una clnica privada, en un centro de atencin urgente o en una sala de emergencias.  Si tiene una emergencia mdica, por favor llame inmediatamente al 911 o vaya a la sala de emergencias.  Nmeros de bper  - Dr. Kowalski: [phone]  - Dra. Moye: [phone]  - Dra. Stewart: [phone]  En caso de inclemencias del tiempo, por favor llame a nuestra lnea principal al [phone] para una actualizacin sobre el estado de cualquier retraso o cierre.  Consejos para la medicacin en dermatologa: Por favor, guarde las cajas en las que vienen los medicamentos de uso tpico para ayudarle a seguir las instrucciones sobre dnde y cmo usarlos. Las farmacias generalmente imprimen las instrucciones del medicamento slo en las cajas y no directamente en los tubos del medicamento.   Si su medicamento es muy caro, por favor, pngase en contacto con nuestra oficina llamando al [phone] y presione la opcin 4 o envenos un mensaje a travs de MyChart.   No podemos decirle cul ser su copago por los medicamentos por adelantado ya que esto es diferente dependiendo de la cobertura de su seguro. Sin embargo, es posible que podamos encontrar un medicamento sustituto a menor costo o llenar un formulario para que el  seguro cubra el medicamento que se considera necesario.   Si se requiere una autorizacin previa para que su compaa de seguros cubra su medicamento, por favor permtanos de 1 a 2 das hbiles para completar este proceso.  Los precios de los medicamentos varan con frecuencia dependiendo del lugar de dnde se surte la receta y alguna farmacias pueden ofrecer precios ms baratos.  El sitio web www.goodrx.com tiene cupones para medicamentos de diferentes farmacias. Los precios aqu no tienen en cuenta lo que podra costar con la ayuda del seguro (puede ser ms barato con su seguro), pero el sitio web puede darle el precio si no utiliz ningn seguro.  - Puede imprimir el cupn correspondiente y llevarlo con su receta a la farmacia.  - Tambin puede pasar por nuestra oficina durante el horario de atencin regular y recoger una tarjeta de cupones de GoodRx.  - Si necesita que su receta se enve electrnicamente a una farmacia diferente, informe a nuestra oficina a travs de MyChart de [place]   o por telfono llamando al [phone] y presione la opcin 4.  

## 2022-09-20 ENCOUNTER — Encounter: Payer: Self-pay | Admitting: Dermatology

## 2022-10-01 ENCOUNTER — Ambulatory Visit (INDEPENDENT_AMBULATORY_CARE_PROVIDER_SITE_OTHER): Payer: BC Managed Care – PPO | Admitting: Dermatology

## 2022-10-01 DIAGNOSIS — L2081 Atopic neurodermatitis: Secondary | ICD-10-CM | POA: Diagnosis not present

## 2022-10-01 MED ORDER — DUPIXENT 300 MG/2ML ~~LOC~~ SOSY: 300.0000 mg | PREFILLED_SYRINGE | SUBCUTANEOUS | 0 refills | Status: DC

## 2022-10-01 NOTE — Progress Notes (Signed)
   Follow-Up Visit   Subjective  Joseph Terry is a 62 y.o. male who presents for the following: Dermatitis (2 weeks f/u on atopic dermatitis on his body, treating with Triamcinolone cream and Dupixent injections with a fair response. Patient brought in MSDS sheet on [employer] that he works with at work. /).  The following portions of the chart were reviewed this encounter and updated as appropriate:   Tobacco  Allergies  Meds  Problems  Med Hx  Surg Hx  Fam Hx     Review of Systems:  No other skin or systemic complaints except as noted in HPI or Assessment and Plan.  Objective  Well appearing patient in no apparent distress; mood and affect are within normal limits.  A focused examination was performed including face, trunks,extremities. Relevant physical exam findings are noted in the Assessment and Plan.  trunk, exts Edema, erythema, and scale with minimal crusted excoriation on arms and neck     Assessment & Plan  Atopic neurodermatitis trunk, exts  Vs allergic contact dermatitis,  bx proven,  remarkably improved on Dupixent  Discussed MSDS sheet on [employer] patient brought in for review today of chemicals he work with daily. MSDS sheet enclosed in chart   Recommend patch testing in the future   Continue Dupixent 300 mg/2 mL SQ QOW. Dupixent 300 mg/9m injected SQ into the R upper arm post. Patient tolerated injection well.  Lot [id number] 12/14/2024  dupilumab (DUPIXENT) 300 MG/2ML prefilled syringe - trunk, exts Inject 300 mg into the skin every 14 (fourteen) days. Starting at day 15 for maintenance.   Return in about 2 weeks (around 10/15/2022) for Dupixent injection.  I,Marye Round CMA, am acting as scribe for DSarina Ser MD .  Documentation: I have reviewed the above documentation for accuracy and completeness, and I agree with the above.  DSarina Ser MD

## 2022-10-01 NOTE — Patient Instructions (Signed)
Due to recent changes in healthcare laws, you may see results of your pathology and/or laboratory studies on MyChart before the doctors have had a chance to review them. We understand that in some cases there may be results that are confusing or concerning to you. Please understand that not all results are received at the same time and often the doctors may need to interpret multiple results in order to provide you with the best plan of care or course of treatment. Therefore, we ask that you please give us 2 business days to thoroughly review all your results before contacting the office for clarification. Should we see a critical lab result, you will be contacted sooner.   If You Need Anything After Your Visit  If you have any questions or concerns for your doctor, please call our main line at [phone] and press option 4 to reach your doctor's medical assistant. If no one answers, please leave a voicemail as directed and we will return your call as soon as possible. Messages left after 4 pm will be answered the following business day.   You may also send us a message via MyChart. We typically respond to MyChart messages within 1-2 business days.  For prescription refills, please ask your pharmacy to contact our office. Our fax number is [phone].  If you have an urgent issue when the clinic is closed that cannot wait until the next business day, you can page your doctor at the number below.    Please note that while we do our best to be available for urgent issues outside of office hours, we are not available 24/7.   If you have an urgent issue and are unable to reach us, you may choose to seek medical care at your doctor's office, retail clinic, urgent care center, or emergency room.  If you have a medical emergency, please immediately call 911 or go to the emergency department.  Pager Numbers  - Dr. Kowalski: [phone]  - Dr. Moye: [phone]  - Dr. Stewart:  [phone]  In the event of inclement weather, please call our main line at [phone] for an update on the status of any delays or closures.  Dermatology Medication Tips: Please keep the boxes that topical medications come in in order to help keep track of the instructions about where and how to use these. Pharmacies typically print the medication instructions only on the boxes and not directly on the medication tubes.   If your medication is too expensive, please contact our office at [phone] option 4 or send us a message through MyChart.   We are unable to tell what your co-pay for medications will be in advance as this is different depending on your insurance coverage. However, we may be able to find a substitute medication at lower cost or fill out paperwork to get insurance to cover a needed medication.   If a prior authorization is required to get your medication covered by your insurance company, please allow us 1-2 business days to complete this process.  Drug prices often vary depending on where the prescription is filled and some pharmacies may offer cheaper prices.  The website www.goodrx.com contains coupons for medications through different pharmacies. The prices here do not account for what the cost may be with help from insurance (it may be cheaper with your insurance), but the website can give you the price if you did not use any insurance.  - You can print the associated coupon and take it with   your prescription to the pharmacy.  - You may also stop by our office during regular business hours and pick up a GoodRx coupon card.  - If you need your prescription sent electronically to a different pharmacy, notify our office through [facility] MyChart or by phone at [phone] option 4.     Si Usted Necesita Algo Despus de Su Visita  Tambin puede enviarnos un mensaje a travs de MyChart. Por lo general respondemos a los mensajes de MyChart en el transcurso de 1 a 2  das hbiles.  Para renovar recetas, por favor pida a su farmacia que se ponga en contacto con nuestra oficina. Nuestro nmero de fax es el [phone].  Si tiene un asunto urgente cuando la clnica est cerrada y que no puede esperar hasta el siguiente da hbil, puede llamar/localizar a su doctor(a) al nmero que aparece a continuacin.   Por favor, tenga en cuenta que aunque hacemos todo lo posible para estar disponibles para asuntos urgentes fuera del horario de oficina, no estamos disponibles las 24 horas del da, los 7 das de la semana.   Si tiene un problema urgente y no puede comunicarse con nosotros, puede optar por buscar atencin mdica  en el consultorio de su doctor(a), en una clnica privada, en un centro de atencin urgente o en una sala de emergencias.  Si tiene una emergencia mdica, por favor llame inmediatamente al 911 o vaya a la sala de emergencias.  Nmeros de bper  - Dr. Kowalski: [phone]  - Dra. Moye: [phone]  - Dra. Stewart: [phone]  En caso de inclemencias del tiempo, por favor llame a nuestra lnea principal al [phone] para una actualizacin sobre el estado de cualquier retraso o cierre.  Consejos para la medicacin en dermatologa: Por favor, guarde las cajas en las que vienen los medicamentos de uso tpico para ayudarle a seguir las instrucciones sobre dnde y cmo usarlos. Las farmacias generalmente imprimen las instrucciones del medicamento slo en las cajas y no directamente en los tubos del medicamento.   Si su medicamento es muy caro, por favor, pngase en contacto con nuestra oficina llamando al [phone] y presione la opcin 4 o envenos un mensaje a travs de MyChart.   No podemos decirle cul ser su copago por los medicamentos por adelantado ya que esto es diferente dependiendo de la cobertura de su seguro. Sin embargo, es posible que podamos encontrar un medicamento sustituto a menor costo o llenar un formulario para que el  seguro cubra el medicamento que se considera necesario.   Si se requiere una autorizacin previa para que su compaa de seguros cubra su medicamento, por favor permtanos de 1 a 2 das hbiles para completar este proceso.  Los precios de los medicamentos varan con frecuencia dependiendo del lugar de dnde se surte la receta y alguna farmacias pueden ofrecer precios ms baratos.  El sitio web www.goodrx.com tiene cupones para medicamentos de diferentes farmacias. Los precios aqu no tienen en cuenta lo que podra costar con la ayuda del seguro (puede ser ms barato con su seguro), pero el sitio web puede darle el precio si no utiliz ningn seguro.  - Puede imprimir el cupn correspondiente y llevarlo con su receta a la farmacia.  - Tambin puede pasar por nuestra oficina durante el horario de atencin regular y recoger una tarjeta de cupones de GoodRx.  - Si necesita que su receta se enve electrnicamente a una farmacia diferente, informe a nuestra oficina a travs de MyChart de [place]   o por telfono llamando al [phone] y presione la opcin 4.  

## 2022-10-08 ENCOUNTER — Ambulatory Visit: Payer: BC Managed Care – PPO | Admitting: Dermatology

## 2022-10-13 ENCOUNTER — Encounter (INDEPENDENT_AMBULATORY_CARE_PROVIDER_SITE_OTHER): Payer: BC Managed Care – PPO | Admitting: Dermatology

## 2022-10-14 ENCOUNTER — Encounter: Payer: Self-pay | Admitting: Dermatology

## 2022-10-28 ENCOUNTER — Ambulatory Visit (INDEPENDENT_AMBULATORY_CARE_PROVIDER_SITE_OTHER): Payer: BC Managed Care – PPO

## 2022-10-28 ENCOUNTER — Ambulatory Visit: Payer: BC Managed Care – PPO | Admitting: Dermatology

## 2022-10-28 DIAGNOSIS — R21 Rash and other nonspecific skin eruption: Secondary | ICD-10-CM

## 2022-10-28 NOTE — Progress Notes (Unsigned)
Patch test application was performed today using standard technique. True test 36 applied to patients upper back at visit today. Patient advised to keep panels dry until he returns to clinic this Thursday for first read. All questions and concerns answered.

## 2022-10-30 ENCOUNTER — Ambulatory Visit (INDEPENDENT_AMBULATORY_CARE_PROVIDER_SITE_OTHER): Payer: BC Managed Care – PPO

## 2022-10-30 DIAGNOSIS — R21 Rash and other nonspecific skin eruption: Secondary | ICD-10-CM

## 2022-10-30 NOTE — Progress Notes (Signed)
Patient here today for Day 3 patch test reading. Patient shows a visible reaction to #5 Caine Mix. Explained the reading process to patient and that if he can have someone check his back each day to check for new reactions. Patient told that its okay to get his back wet but still to avoid soaking or scrubbing the testing sight if at all possible. Encouraged to touch up marking sites if needed to ensure accurate reading at his visit with Dr. Nehemiah Massed.

## 2022-11-04 ENCOUNTER — Ambulatory Visit (INDEPENDENT_AMBULATORY_CARE_PROVIDER_SITE_OTHER): Payer: BC Managed Care – PPO | Admitting: Dermatology

## 2022-11-04 VITALS — BP 141/67 | HR 71

## 2022-11-04 DIAGNOSIS — L2389 Allergic contact dermatitis due to other agents: Secondary | ICD-10-CM

## 2022-11-04 DIAGNOSIS — L2081 Atopic neurodermatitis: Secondary | ICD-10-CM | POA: Diagnosis not present

## 2022-11-04 DIAGNOSIS — L245 Irritant contact dermatitis due to other chemical products: Secondary | ICD-10-CM

## 2022-11-04 MED ORDER — TRIAMCINOLONE ACETONIDE 0.1 % EX CREA: 1.0000 | TOPICAL_CREAM | CUTANEOUS | 1 refills | Status: DC

## 2022-11-04 NOTE — Progress Notes (Signed)
Follow-Up Visit   Subjective  Joseph Terry is a 62 y.o. male who presents for the following: Atopic Derm vs Allergic Contact Derm (1 wk True Patch Test f/u, positive reaction at #5 [place] mix on day 3 f/u).  The following portions of the chart were reviewed this encounter and updated as appropriate:   Tobacco  Allergies  Meds  Problems  Med Hx  Surg Hx  Fam Hx     Review of Systems:  No other skin or systemic complaints except as noted in HPI or Assessment and Plan.  Objective  Well appearing patient in no apparent distress; mood and affect are within normal limits.  A focused examination was performed including back. Relevant physical exam findings are noted in the Assessment and Plan.  Right Upper Back  T.R.U.E. Test     Row Name 11/04/22 1600           Time Antigen Placed 1601       Manufacturer Other       Location Back       Reading Interval Day 8       Panel Panel 1;Panel 2;Panel 3       1. Nickel Sulfate 0       2. Wool Alcohols 0       3. Neomycin Sulfate 0       4. Potassium Dichromate 0       5. Caine Mix 2       6. Fragrance Mix 0       7. Colophony 0       8. Paraben Mix 0       9. Negative Control 0       10. Balsam of [place] 0       11. Ethylenediamine Dihydrochloride 0       12. Cobalt Dichloride 0       13. p-tert Butylphenol Formaldehyde Resin 0       14. Epoxy Resin 0       15. Carba Mix 0       16.  Black Rubber Mix 0       17. Cl+ Me-Isothiazolinone 0       18. Quaternium-15 0       19. Methyldibromo Glutaronitrile 0       20. p-Phenylenediamine 0       21. Formaldehyde 0       22. Mercapto Mix 0       23. Thimerosal 0       24. Thiuram Mix 0       25. Diazolidinyl Urea 0       26. Quinoline Mix 0       27. Tixocortol-21-Pivalate 0       28. Gold Sodium Thiosulfate 0       29. Imidazolidinyl Urea 0       30. Budesonide 0       31. Hydrocortisone-17-Butyrate 0       32. Mercaptobenzothiazole 0       33. Bacitracin 0       34.  Parthenolide 0       35. Disperse Blue 106 0       36. 2-Bromo-2-Nitropropane-1,3-diol 0                    trunk, arms Patchy scaliness arms   Assessment & Plan  Allergic contact dermatitis due to other agents Right Upper Back True Patch Test 36  shows positive reaction to # 5 [employer] on [employer] given to patient  Recommend pt change clothes when gets home from work Discussed open patch test to [employer], chemical at work Advised we do the think he is having problems with contact dermatitis to the chemical [employer] from work.  Atopic neurodermatitis trunk, arms Decrease TMC 0.1% cr to qd up to 3d/wk prn flares Start Opzelura cream qd  Will continue to be off Dupixent for now  Topical steroids (such as triamcinolone, fluocinolone, fluocinonide, mometasone, clobetasol, halobetasol, betamethasone, hydrocortisone) can cause thinning and lightening of the skin if they are used for too long in the same area. Your physician has selected the right strength medicine for your problem and area affected on the body. Please use your medication only as directed by your physician to prevent side effects.    triamcinolone cream (KENALOG) 0.1 % - trunk, arms Apply 1 Application topically as directed. Qd up to 3 days per week until clear, avoid face, groin, axilla  Related Medications dupilumab (DUPIXENT) 300 MG/2ML prefilled syringe Inject 300 mg into the skin every 14 (fourteen) days. Starting at day 15 for maintenance.  Return in about 3 months (around 02/02/2023).  I, Othelia Pulling, RMA, am acting as scribe for Sarina Ser, MD . Documentation: I have reviewed the above documentation for accuracy and completeness, and I agree with the above.  Sarina Ser, MD

## 2022-11-04 NOTE — Patient Instructions (Addendum)
Decrease Triamcinolone 0.1% cream to once daily up to 3 days a week as needed for flares, avoid face, groin, underarms  Start Opzelura cream once daily to areas of eczema   Your prescription was sent to [facility] in [place]. A representative from [place] will contact you within 3 business hours to verify your address and insurance information to schedule a free delivery. If for any reason you do not receive a phone call from them, please reach out to them. Their phone number is [phone] and their hours are Monday-Friday 9:00 am-5:00 pm.     Due to recent changes in healthcare laws, you may see results of your pathology and/or laboratory studies on MyChart before the doctors have had a chance to review them. We understand that in some cases there may be results that are confusing or concerning to you. Please understand that not all results are received at the same time and often the doctors may need to interpret multiple results in order to provide you with the best plan of care or course of treatment. Therefore, we ask that you please give [place] 2 business days to thoroughly review all your results before contacting the office for clarification. Should we see a critical lab result, you will be contacted sooner.   If You Need Anything After Your Visit  If you have any questions or concerns for your doctor, please call our main line at [phone] and press option 4 to reach your doctor's medical assistant. If no one answers, please leave a voicemail as directed and we will return your call as soon as possible. Messages left after 4 pm will be answered the following business day.   You may also send [place] a message via [place]. We typically respond to MyChart messages within 1-2 business days.  For prescription refills, please ask your pharmacy to contact our office. Our fax number is [phone].  If you have an urgent issue when the clinic is closed that cannot wait until the  next business day, you can page your doctor at the number below.    Please note that while we do our best to be available for urgent issues outside of office hours, we are not available 24/7.   If you have an urgent issue and are unable to reach [place], you may choose to seek medical care at your doctor's office, retail clinic, urgent care center, or emergency room.  If you have a medical emergency, please immediately call 911 or go to the emergency department.  Pager Numbers  - Dr. Nehemiah Massed: [phone]  - Dr. Laurence Ferrari: [phone]  - Dr. Nicole Kindred: [phone]  In the event of inclement weather, please call our main line at [phone] for an update on the status of any delays or closures.  Dermatology Medication Tips: Please keep the boxes that topical medications come in in order to help keep track of the instructions about where and how to use these. Pharmacies typically print the medication instructions only on the boxes and not directly on the medication tubes.   If your medication is too expensive, please contact our office at [phone] option 4 or send [place] a message through [place].   We are unable to tell what your co-pay for medications will be in advance as this is different depending on your insurance coverage. However, we may be able to find a substitute medication at lower cost or fill out paperwork to get insurance to cover a needed medication.   If a prior authorization  is required to get your medication covered by your insurance company, please allow [place] 1-2 business days to complete this process.  Drug prices often vary depending on where the prescription is filled and some pharmacies may offer cheaper prices.  The website www.goodrx.com contains coupons for medications through different pharmacies. The prices here do not account for what the cost may be with help from insurance (it may be cheaper with your insurance), but the website can give you the price if you did not  use any insurance.  - You can print the associated coupon and take it with your prescription to the pharmacy.  - You may also stop by our office during regular business hours and pick up a GoodRx coupon card.  - If you need your prescription sent electronically to a different pharmacy, notify our office through [facility] or by phone at [phone] option 4.     Si Usted Necesita Algo Despus de Su Visita  Tambin puede enviarnos un mensaje a travs de Pharmacist, community. Por lo general respondemos a los mensajes de MyChart en el transcurso de 1 a 2 das hbiles.  Para renovar recetas, por favor pida a su farmacia que se ponga en contacto con nuestra oficina. Harland Dingwall de fax es [place] [phone].  Si tiene un asunto urgente cuando la clnica est cerrada y que no puede esperar hasta el siguiente da hbil, puede llamar/localizar a su doctor(a) al nmero que aparece a continuacin.   Por favor, tenga en cuenta que aunque hacemos todo lo posible para estar disponibles para asuntos urgentes fuera del horario de [place], no estamos disponibles las 24 horas del da, los 7 das de la [place].   Si tiene un problema urgente y no puede comunicarse con nosotros, puede optar por buscar atencin mdica  en el consultorio de su doctor(a), en una clnica privada, en un centro de atencin urgente o en una sala de emergencias.  Si tiene Engineering geologist, por favor llame inmediatamente al 911 o vaya a la sala de emergencias.  Nmeros de bper  - Dr. Nehemiah Massed: [phone]  - Dra. Moye: [phone]  - Dra. Nicole Kindred: [phone]  En caso de inclemencias del [place], por favor llame a Johnsie Kindred principal al [phone] para una actualizacin sobre el [place] de cualquier retraso o cierre.  Consejos para la medicacin en dermatologa: Por favor, guarde las cajas en las que vienen los medicamentos de uso tpico para ayudarle a seguir las instrucciones sobre dnde y cmo usarlos. Las farmacias  generalmente imprimen las instrucciones del medicamento slo en las cajas y no directamente en los tubos del [place].   Si su medicamento es muy caro, por favor, pngase en contacto con Zigmund Daniel llamando al [phone] y presione la opcin 4 o envenos un mensaje a travs de Pharmacist, community.   No podemos decirle cul ser su copago por los medicamentos por adelantado ya que esto es diferente dependiendo de la cobertura de su seguro. Sin embargo, es posible que podamos encontrar un medicamento sustituto a Electrical engineer un formulario para que el seguro cubra el medicamento que se considera necesario.   Si se requiere una autorizacin previa para que su compaa de seguros [place] su medicamento, por favor permtanos de 1 a 2 das hbiles para completar este proceso.  Los precios de los medicamentos varan con frecuencia dependiendo del Environmental consultant de dnde se surte la receta y alguna farmacias pueden ofrecer precios ms baratos.  El sitio web www.goodrx.com tiene cupones para medicamentos  de [employer]. Los precios aqu no tienen en cuenta lo que podra costar con la ayuda del seguro (puede ser ms barato con su seguro), pero el sitio web puede darle el precio si no utiliz Research scientist (physical sciences).  - Puede imprimir el cupn correspondiente y llevarlo con su receta a la farmacia.  - Tambin puede pasar por nuestra oficina durante el horario de atencin regular y Charity fundraiser una tarjeta de cupones de GoodRx.  - Si necesita que su receta se enve electrnicamente a una farmacia diferente, informe a nuestra oficina a travs de MyChart de [place] o por telfono llamando al [phone] y presione la opcin 4.

## 2022-11-09 ENCOUNTER — Encounter: Payer: Self-pay | Admitting: Dermatology

## 2022-11-20 ENCOUNTER — Encounter: Payer: Self-pay | Admitting: Oncology

## 2022-12-02 ENCOUNTER — Other Ambulatory Visit: Payer: Self-pay | Admitting: Family Medicine

## 2022-12-02 DIAGNOSIS — F418 Other specified anxiety disorders: Secondary | ICD-10-CM

## 2022-12-04 ENCOUNTER — Other Ambulatory Visit: Payer: Self-pay | Admitting: Family Medicine

## 2022-12-04 MED ORDER — ROSUVASTATIN CALCIUM 40 MG PO TABS: 40.0000 mg | ORAL_TABLET | Freq: Every day | ORAL | 3 refills | Status: DC

## 2022-12-22 ENCOUNTER — Telehealth: Payer: Self-pay | Admitting: Family Medicine

## 2022-12-22 NOTE — Telephone Encounter (Signed)
[facility] pharmacy faxed refill request for the following medications:   rosuvastatin (CRESTOR) 40 MG tablet     Please advise

## 2022-12-24 NOTE — Telephone Encounter (Signed)
Disp Refills Start End   rosuvastatin (CRESTOR) 40 MG tablet 90 tablet 3 12/04/2022    Sig - Route: Take 1 tablet (40 mg total) by mouth daily. - Oral   Sent to pharmacy as: rosuvastatin (CRESTOR) 40 MG tablet   E-Prescribing Status: Receipt confirmed by pharmacy (12/04/2022 10:03 PM EDT)   Rhina Brackett to pharmacy.

## 2023-02-04 ENCOUNTER — Encounter: Payer: Self-pay | Admitting: Oncology

## 2023-02-11 ENCOUNTER — Ambulatory Visit: Payer: BC Managed Care – PPO | Admitting: Dermatology

## 2023-03-17 ENCOUNTER — Other Ambulatory Visit: Payer: Self-pay

## 2023-03-17 DIAGNOSIS — F418 Other specified anxiety disorders: Secondary | ICD-10-CM

## 2023-03-17 NOTE — Telephone Encounter (Signed)
LOV 08/26/22 NOV none LRF 12/02/22 90 x 0

## 2023-03-20 ENCOUNTER — Other Ambulatory Visit: Payer: Self-pay | Admitting: Family Medicine

## 2023-03-20 DIAGNOSIS — F418 Other specified anxiety disorders: Secondary | ICD-10-CM

## 2023-03-20 NOTE — Telephone Encounter (Signed)
Requested medication (s) are due for refill today:yes  Requested medication (s) are on the active medication list: yes  Last refill:  12/02/22 #90  Future visit scheduled:no  Notes to clinic:  called pt and LM on VM to call back to make appt- this is the second request - pt needs appt    Requested Prescriptions  Pending Prescriptions Disp Refills   clonazePAM (KLONOPIN) 0.5 MG tablet 90 tablet 0    Sig: Take 1 tablet (0.5 mg total) by mouth daily.     Not Delegated - Psychiatry: Anxiolytics/Hypnotics 2 Failed - 03/20/2023 10:03 AM      Failed - This refill cannot be delegated      Failed - Urine Drug Screen completed in last 360 days      Failed - Valid encounter within last 6 months    Recent Outpatient Visits           6 months ago Annual physical exam   [facility] Jacky Kindle, FNP   7 months ago Rash   [facility] Caro Laroche, DO   11 months ago Type 2 diabetes mellitus with other circulatory complication, without long-term current use of insulin [facility])   [facility] Jacky Kindle, FNP   1 year ago Annual physical exam   [place] [facility] Jacky Kindle, FNP   1 year ago Hypertension associated with diabetes [facility])   [facility] Bacigalupo, Marzella Schlein, MD              Passed - Patient is not pregnant

## 2023-03-20 NOTE — Telephone Encounter (Signed)
Patient requesting short supply of 7 tablets until medication from mail order pharmacy is received.

## 2023-03-20 NOTE — Telephone Encounter (Signed)
Copied from CRM [id number]. Topic: General - Other >> Mar 20, 2023 10:03 AM Everette C wrote: Reason for CRM: Medication Refill - Medication: clonazePAM (KLONOPIN) 0.5 MG tablet [[id number]]  The patient would like a modified quantity of 7 tablets until their prescription from [facility] is received   Has the patient contacted their pharmacy? Yes.   (Agent: If no, request that the patient contact the pharmacy for the refill. If patient does not wish to contact the pharmacy document the reason why and proceed with request.) (Agent: If yes, when and what did the pharmacy advise?)  Preferred Pharmacy (with phone number or street name): [facility] ST[facility] #[id number] [place], [place] - [address] AT [facility] [address] [place] [address] Phone: [phone] Fax: [phone] Hours: Not open 24 hours   Has the patient been seen for an appointment in the last year OR does the patient have an upcoming appointment? Yes.    Agent: Please be advised that RX refills may take up to 3 business days. We ask that you follow-up with your pharmacy.

## 2023-03-20 NOTE — Telephone Encounter (Signed)
Requested medication (s) are due for refill today: yes  Requested medication (s) are on the active medication list: yes  Last refill:  12/02/22  Future visit scheduled: no  Notes to clinic:  Unable to refill per protocol, cannot delegate. Patient request 7 tablet supply until his mail order pharmacy delivers and until OV can be made. Routing for approval.      Requested Prescriptions  Pending Prescriptions Disp Refills   clonazePAM (KLONOPIN) 0.5 MG tablet 90 tablet 0    Sig: Take 1 tablet (0.5 mg total) by mouth daily.     Not Delegated - Psychiatry: Anxiolytics/Hypnotics 2 Failed - 03/20/2023 11:26 AM      Failed - This refill cannot be delegated      Failed - Urine Drug Screen completed in last 360 days      Failed - Valid encounter within last 6 months    Recent Outpatient Visits           6 months ago Annual physical exam   [facility] Jacky Kindle, FNP   7 months ago Rash   [facility] Caro Laroche, DO   11 months ago Type 2 diabetes mellitus with other circulatory complication, without long-term current use of insulin [facility])   [facility] Jacky Kindle, FNP   1 year ago Annual physical exam   [facility] Jacky Kindle, FNP   1 year ago Hypertension associated with diabetes [facility])   [facility] Bacigalupo, Marzella Schlein, MD              Passed - Patient is not pregnant

## 2023-03-20 NOTE — Telephone Encounter (Signed)
Copied from CRM [id number]. Topic: General - Other >> Mar 20, 2023  9:47 AM Everette C wrote: Reason for CRM: Medication Refill - Medication: clonazePAM (KLONOPIN) 0.5 MG tablet [[id number]]  Has the patient contacted their pharmacy? Yes.   (Agent: If no, request that the patient contact the pharmacy for the refill. If patient does not wish to contact the pharmacy document the reason why and proceed with request.) (Agent: If yes, when and what did the pharmacy advise?)  Preferred Pharmacy (with phone number or street name): [facility] - [place], [place] - [address] [place] [address] Phone: [phone] Fax: [phone] Hours: Not open 24 hours   Has the patient been seen for an appointment in the last year OR does the patient have an upcoming appointment? Yes.    Agent: Please be advised that RX refills may take up to 3 business days. We ask that you follow-up with your pharmacy.

## 2023-03-23 ENCOUNTER — Other Ambulatory Visit: Payer: Self-pay | Admitting: Family Medicine

## 2023-03-23 DIAGNOSIS — F418 Other specified anxiety disorders: Secondary | ICD-10-CM

## 2023-03-23 NOTE — Telephone Encounter (Signed)
Medication Refill - Medication: clonazePAM (KLONOPIN) 0.5 MG tablet    ([facility] take to [facility] and he starts a new job tomorrow)   Has the patient contacted their pharmacy? Yes.    Preferred Pharmacy (with phone number or street name):  [facility] STORE #[id number] [place], [place] - [address] AT [facility]  [address] [place] [address]  Phone: [phone] Fax: [phone]  Hours: Not open 24 hours   Has the patient been seen for an appointment in the last year OR does the patient have an upcoming appointment? Yes.    Agent: Please be advised that RX refills may take up to 3 business days. We ask that you follow-up with your pharmacy.

## 2023-03-23 NOTE — Telephone Encounter (Signed)
Requested medication (s) are due for refill today:Yes  Requested medication (s) are on the active medication list: Yes  Last refill:  12/02/22  Future visit scheduled: Yes  Notes to clinic:  Unable to refill per protocol, cannot delegate. Resend to a new pharmacy.     Requested Prescriptions  Pending Prescriptions Disp Refills   clonazePAM (KLONOPIN) 0.5 MG tablet 90 tablet 0    Sig: Take 1 tablet (0.5 mg total) by mouth daily.     Not Delegated - Psychiatry: Anxiolytics/Hypnotics 2 Failed - 03/23/2023 10:51 AM      Failed - This refill cannot be delegated      Failed - Urine Drug Screen completed in last 360 days      Failed - Valid encounter within last 6 months    Recent Outpatient Visits           6 months ago Annual physical exam   [facility] Jacky Kindle, FNP   7 months ago Rash   [facility] Caro Laroche, DO   11 months ago Type 2 diabetes mellitus with other circulatory complication, without long-term current use of insulin [facility])   [place] [facility] Jacky Kindle, FNP   1 year ago Annual physical exam   [facility] Jacky Kindle, FNP   1 year ago Hypertension associated with diabetes [facility])   [facility] Bacigalupo, Marzella Schlein, MD       Future Appointments             Tomorrow Jacky Kindle, FNP  [facility]            Passed - Patient is not pregnant

## 2023-03-24 ENCOUNTER — Encounter: Payer: Self-pay | Admitting: Oncology

## 2023-03-24 ENCOUNTER — Encounter: Payer: Self-pay | Admitting: Family Medicine

## 2023-03-24 ENCOUNTER — Ambulatory Visit (INDEPENDENT_AMBULATORY_CARE_PROVIDER_SITE_OTHER): Payer: BC Managed Care – PPO | Admitting: Family Medicine

## 2023-03-24 VITALS — BP 149/89 | HR 92 | Ht 70.0 in | Wt 157.0 lb

## 2023-03-24 DIAGNOSIS — Z79899 Other long term (current) drug therapy: Secondary | ICD-10-CM | POA: Diagnosis not present

## 2023-03-24 DIAGNOSIS — I152 Hypertension secondary to endocrine disorders: Secondary | ICD-10-CM | POA: Diagnosis not present

## 2023-03-24 DIAGNOSIS — F418 Other specified anxiety disorders: Secondary | ICD-10-CM

## 2023-03-24 DIAGNOSIS — E1159 Type 2 diabetes mellitus with other circulatory complications: Secondary | ICD-10-CM | POA: Diagnosis not present

## 2023-03-24 MED ORDER — CLONAZEPAM 0.5 MG PO TABS: 0.5000 mg | ORAL_TABLET | Freq: Every day | ORAL | 1 refills | Status: DC | PRN

## 2023-03-24 NOTE — Progress Notes (Signed)
Established patient visit   Patient: Joseph Terry   DOB: Dec 01, 1960   62 y.o. Male  MRN: [id number] Visit Date: 03/24/2023  Today's healthcare provider: Jacky Kindle, FNP  Re Introduced to nurse practitioner role and practice setting.  All questions answered.  Discussed provider/patient relationship and expectations.  Chief Complaint  Patient presents with   Medication Refill    clonazepam   Subjective    Medication Refill   HPI     Medication Refill    Additional comments: clonazepam      Last edited by Shelly Bombard, CMA on 03/24/2023  3:59 PM.      Medications: Outpatient Medications Prior to Visit  Medication Sig   dapagliflozin propanediol (FARXIGA) 10 MG TABS tablet Take 1 tablet (10 mg total) by mouth daily.   fenofibrate (TRICOR) 145 MG tablet Take 1 tablet (145 mg total) by mouth daily.   ferrous sulfate 325 (65 FE) MG EC tablet Take 1 tablet (325 mg total) by mouth 2 (two) times daily with breakfast and lunch.   glipiZIDE (GLUCOTROL XL) 10 MG 24 hr tablet Take 1 tablet (10 mg total) by mouth daily with breakfast.   metFORMIN (GLUCOPHAGE) 1000 MG tablet Take 1 tablet (1,000 mg total) by mouth 2 (two) times daily with a meal.   metoprolol tartrate (LOPRESSOR) 25 MG tablet TAKE 1 TABLET(25 MG) BY MOUTH TWICE DAILY   omeprazole (PRILOSEC) 40 MG capsule TAKE 1 CAPSULE(40 MG) BY MOUTH DAILY   rosuvastatin (CRESTOR) 40 MG tablet Take 1 tablet (40 mg total) by mouth daily.   tamsulosin (FLOMAX) 0.4 MG CAPS capsule Take 1 capsule (0.4 mg total) by mouth daily.   venlafaxine XR (EFFEXOR-XR) 150 MG 24 hr capsule TAKE 1 CAPSULE(150 MG) BY MOUTH DAILY WITH BREAKFAST   [DISCONTINUED] clonazePAM (KLONOPIN) 0.5 MG tablet TAKE 1 TABLET DAILY   [DISCONTINUED] dupilumab (DUPIXENT) 300 MG/2ML prefilled syringe Inject 300 mg into the skin every 14 (fourteen) days. Starting at day 15 for maintenance.   [DISCONTINUED] dupilumab (DUPIXENT) 300 MG/2ML prefilled syringe Inject 300  mg into the skin every 14 (fourteen) days. Starting at day 15 for maintenance.   [DISCONTINUED] hydrOXYzine (VISTARIL) 25 MG capsule Take 1 capsule (25 mg total) by mouth every 8 (eight) hours as needed.   [DISCONTINUED] Ruxolitinib Phosphate (OPZELURA) 1.5 % CREA Apply bid to affected areas of rash qd   [DISCONTINUED] sildenafil (REVATIO) 20 MG tablet 3-5 tablets 1 hour prior to intercourse   [DISCONTINUED] triamcinolone cream (KENALOG) 0.1 % Apply 1 Application topically as directed. Qd up to 3 days per week until clear, avoid face, groin, axilla   No facility-administered medications prior to visit.    Review of Systems    Objective    BP (!) 149/89 (BP Location: Left Arm, Patient Position: Sitting, Cuff Size: Normal)   Pulse 92   Ht 5\' 10"  (1.778 m)   Wt 157 lb (71.2 kg)   SpO2 98%   BMI 22.53 kg/m   Physical Exam Vitals and nursing note reviewed.  Constitutional:      Appearance: Normal appearance. He is normal weight.  HENT:     Head: Normocephalic and atraumatic.  Cardiovascular:     Rate and Rhythm: Normal rate and regular rhythm.     Pulses: Normal pulses.     Heart sounds: Normal heart sounds.  Pulmonary:     Effort: Pulmonary effort is normal.     Breath sounds: Normal breath sounds.  Musculoskeletal:  General: Normal range of motion.     Cervical back: Normal range of motion.  Skin:    General: Skin is warm and dry.     Capillary Refill: Capillary refill takes less than 2 seconds.  Neurological:     General: No focal deficit present.     Mental Status: He is alert and oriented to person, place, and time. Mental status is at baseline.  Psychiatric:        Mood and Affect: Mood normal.        Behavior: Behavior normal.        Thought Content: Thought content normal.        Judgment: Judgment normal.      No results found for any visits on 03/24/23.  Assessment & Plan     Problem List Items Addressed This Visit       Cardiovascular and  Mediastinum   Hypertension associated with diabetes (HCC)    Chronic; uncontrolled BP elevated today- pt declines secondary BP check Previously controlled with use of BB metop 25 mg BID; farxiga 10 mg Goal 129/89         Other   Chronically on benzodiazepine therapy   Depression with anxiety - Primary    Chronic; exacerbated following non scheduled wean of klonopin due to lack of follow up adherence Also remains on effexor 150 mg Declines adjustment in effexor at this time Encourage use of klonopin as abortive as needed medication vs daily; further education provided on use of chronic benzo medication and the risk vs benefit and the need for follow up       Relevant Medications   clonazePAM (KLONOPIN) 0.5 MG tablet   Return in about 6 months (around 09/24/2023) for annual examination.     Leilani Merl, FNP, have reviewed all documentation for this visit. The documentation on 03/24/23 for the exam, diagnosis, procedures, and orders are all accurate and complete.  Jacky Kindle, FNP  [facility] [phone] (phone) [phone] (fax)  [facility]

## 2023-03-24 NOTE — Assessment & Plan Note (Signed)
Chronic; uncontrolled BP elevated today- pt declines secondary BP check Previously controlled with use of BB metop 25 mg BID; farxiga 10 mg Goal 129/89

## 2023-03-24 NOTE — Assessment & Plan Note (Signed)
Chronic; exacerbated following non scheduled wean of klonopin due to lack of follow up adherence Also remains on effexor 150 mg Declines adjustment in effexor at this time Encourage use of klonopin as abortive as needed medication vs daily; further education provided on use of chronic benzo medication and the risk vs benefit and the need for follow up

## 2023-03-30 ENCOUNTER — Other Ambulatory Visit: Payer: Self-pay | Admitting: Family Medicine

## 2023-03-30 DIAGNOSIS — F418 Other specified anxiety disorders: Secondary | ICD-10-CM

## 2023-03-30 NOTE — Telephone Encounter (Signed)
[facility] faxed refill request for the following medications:   clonazePAM (KLONOPIN) 0.5 MG tablet    Please advise.

## 2023-04-01 MED ORDER — CLONAZEPAM 0.5 MG PO TABS
0.5000 mg | ORAL_TABLET | Freq: Every day | ORAL | 1 refills | Status: DC | PRN
Start: 2023-04-01 — End: 2023-09-01

## 2023-04-22 ENCOUNTER — Encounter: Payer: Self-pay | Admitting: Dermatology

## 2023-04-22 ENCOUNTER — Ambulatory Visit (INDEPENDENT_AMBULATORY_CARE_PROVIDER_SITE_OTHER): Payer: BC Managed Care – PPO | Admitting: Dermatology

## 2023-04-22 ENCOUNTER — Encounter: Payer: Self-pay | Admitting: Oncology

## 2023-04-22 VITALS — BP 107/60 | HR 73

## 2023-04-22 DIAGNOSIS — L209 Atopic dermatitis, unspecified: Secondary | ICD-10-CM | POA: Diagnosis not present

## 2023-04-22 DIAGNOSIS — W908XXA Exposure to other nonionizing radiation, initial encounter: Secondary | ICD-10-CM | POA: Diagnosis not present

## 2023-04-22 DIAGNOSIS — L308 Other specified dermatitis: Secondary | ICD-10-CM | POA: Diagnosis not present

## 2023-04-22 DIAGNOSIS — L578 Other skin changes due to chronic exposure to nonionizing radiation: Secondary | ICD-10-CM

## 2023-04-22 MED ORDER — DUPILUMAB 300 MG/2ML ~~LOC~~ SOPN
600.0000 mg | PEN_INJECTOR | Freq: Once | SUBCUTANEOUS | Status: AC
Start: 2023-04-22 — End: 2023-04-22
  Administered 2023-04-22: 600 mg via SUBCUTANEOUS

## 2023-04-22 MED ORDER — DUPIXENT 300 MG/2ML ~~LOC~~ SOAJ
300.0000 mg | SUBCUTANEOUS | 5 refills | Status: DC
Start: 2023-04-22 — End: 2023-05-12

## 2023-04-22 MED ORDER — CLOBETASOL PROPIONATE 0.05 % EX CREA
TOPICAL_CREAM | CUTANEOUS | 2 refills | Status: DC
Start: 2023-04-22 — End: 2023-05-06

## 2023-04-22 NOTE — Patient Instructions (Signed)
Apply clobetasol 0.05% cream twice a day as needed to affected areas for up to 2 weeks. Avoid applying to face, groin, and axilla. Use as directed. Long-term use can cause thinning of the skin.  Topical steroids (such as triamcinolone, fluocinolone, fluocinonide, mometasone, clobetasol, halobetasol, betamethasone, hydrocortisone) can cause thinning and lightening of the skin if they are used for too long in the same area. Your physician has selected the right strength medicine for your problem and area affected on the body. Please use your medication only as directed by your physician to prevent side effects.    Dupilumab (Dupixent) is a treatment given by injection for adults and children with moderate-to-severe atopic dermatitis. Goal is control of skin condition, not cure. It is given as 2 injections at the first dose followed by 1 injection ever 2 weeks thereafter.  Young children are dosed monthly.  Potential side effects include allergic reaction, herpes infections, injection site reactions and conjunctivitis (inflammation of the eyes).  The use of Dupixent requires long term medication management, including periodic office visits.   Gentle Skin Care Guide  1. Bathe no more than once a day.  2. Avoid bathing in hot water  3. Use a mild soap like Dove, Vanicream, Cetaphil, CeraVe. Can use Lever 2000 or Cetaphil antibacterial soap  4. Use soap only where you need it. On most days, use it under your arms, between your legs, and on your feet. Let the water rinse other areas unless visibly dirty.  5. When you get out of the bath/shower, use a towel to gently blot your skin dry, don't rub it.  6. While your skin is still a little damp, apply a moisturizing cream such as Vanicream, CeraVe, Cetaphil, Eucerin, Sarna lotion or plain Vaseline Jelly. For hands apply Neutrogena [place] Hand Cream or Excipial Hand Cream.  7. Reapply moisturizer any time you start to itch or feel dry.  8. Sometimes  using free and clear laundry detergents can be helpful. Fabric softener sheets should be avoided. Downy Free & Gentle liquid, or any liquid fabric softener that is free of dyes and perfumes, it acceptable to use  9. If your doctor has given you prescription creams you may apply moisturizers over them     Due to recent changes in healthcare laws, you may see results of your pathology and/or laboratory studies on MyChart before the doctors have had a chance to review them. We understand that in some cases there may be results that are confusing or concerning to you. Please understand that not all results are received at the same time and often the doctors may need to interpret multiple results in order to provide you with the best plan of care or course of treatment. Therefore, we ask that you please give [place] 2 business days to thoroughly review all your results before contacting the office for clarification. Should we see a critical lab result, you will be contacted sooner.   If You Need Anything After Your Visit  If you have any questions or concerns for your doctor, please call our main line at [phone] and press option 4 to reach your doctor's medical assistant. If no one answers, please leave a voicemail as directed and we will return your call as soon as possible. Messages left after 4 pm will be answered the following business day.   You may also send [place] a message via MyChart. We typically respond to MyChart messages within 1-2 business days.  For prescription refills, please ask  your pharmacy to contact our office. Our fax number is [phone].  If you have an urgent issue when the clinic is closed that cannot wait until the next business day, you can page your doctor at the number below.    Please note that while we do our best to be available for urgent issues outside of office hours, we are not available 24/7.   If you have an urgent issue and are unable to reach [place], you may choose  to seek medical care at your doctor's office, retail clinic, urgent care center, or emergency room.  If you have a medical emergency, please immediately call 911 or go to the emergency department.  Pager Numbers  - Dr. Gwen Pounds: [phone]  - Dr. Roseanne Reno: [phone]  In the event of inclement weather, please call our main line at [phone] for an update on the status of any delays or closures.  Dermatology Medication Tips: Please keep the boxes that topical medications come in in order to help keep track of the instructions about where and how to use these. Pharmacies typically print the medication instructions only on the boxes and not directly on the medication tubes.   If your medication is too expensive, please contact our office at [phone] option 4 or send [place] a message through MyChart.   We are unable to tell what your co-pay for medications will be in advance as this is different depending on your insurance coverage. However, we may be able to find a substitute medication at lower cost or fill out paperwork to get insurance to cover a needed medication.   If a prior authorization is required to get your medication covered by your insurance company, please allow [place] 1-2 business days to complete this process.  Drug prices often vary depending on where the prescription is filled and some pharmacies may offer cheaper prices.  The website www.goodrx.com contains coupons for medications through different pharmacies. The prices here do not account for what the cost may be with help from insurance (it may be cheaper with your insurance), but the website can give you the price if you did not use any insurance.  - You can print the associated coupon and take it with your prescription to the pharmacy.  - You may also stop by our office during regular business hours and pick up a GoodRx coupon card.  - If you need your prescription sent electronically to a different pharmacy, notify  our office through [facility] or by phone at [phone] option 4.     Si Usted Necesita Algo Despus de Su Visita  Tambin puede enviarnos un mensaje a travs de Clinical cytogeneticist. Por lo general respondemos a los mensajes de MyChart en el transcurso de 1 a 2 das hbiles.  Para renovar recetas, por favor pida a su farmacia que se ponga en contacto con nuestra oficina. Annie Sable de fax es [place] [phone].  Si tiene un asunto urgente cuando la clnica est cerrada y que no puede esperar hasta el siguiente da hbil, puede llamar/localizar a su doctor(a) al nmero que aparece a continuacin.   Por favor, tenga en cuenta que aunque hacemos todo lo posible para estar disponibles para asuntos urgentes fuera del horario de [place], no estamos disponibles las 24 horas del da, los 7 [address]  [address] de la [place].   Si tiene un problema urgente y no puede comunicarse con nosotros, puede optar por buscar atencin mdica  en el consultorio de su doctor(a), en una clnica privada, en  un centro de atencin urgente o en una sala de emergencias.  Si tiene Engineer, drilling, por favor llame inmediatamente al 911 o vaya a la sala de emergencias.  Nmeros de bper  - Dr. Gwen Pounds: [phone]  - Dra. Roseanne Reno: [phone]  En caso de inclemencias del [place], por favor llame a Lacy Duverney principal al [phone] para una actualizacin sobre el [place] de cualquier retraso o cierre.  Consejos para la medicacin en dermatologa: Por favor, guarde las cajas en las que vienen los medicamentos de uso tpico para ayudarle a seguir las instrucciones sobre dnde y cmo usarlos. Las farmacias generalmente imprimen las instrucciones del medicamento slo en las cajas y no directamente en los tubos del [place].   Si su medicamento es muy caro, por favor, pngase en contacto con Rolm Gala llamando al [phone] y presione la opcin 4 o envenos un mensaje a travs de Clinical cytogeneticist.   No podemos decirle cul  ser su copago por los medicamentos por adelantado ya que esto es diferente dependiendo de la cobertura de su seguro. Sin embargo, es posible que podamos encontrar un medicamento sustituto a Audiological scientist un formulario para que el seguro cubra el medicamento que se considera necesario.   Si se requiere una autorizacin previa para que su compaa de seguros [place] su medicamento, por favor permtanos de 1 a 2 das hbiles para completar [address]  Los precios de los medicamentos varan con frecuencia dependiendo del Environmental consultant de dnde se surte la receta y alguna farmacias pueden ofrecer precios ms baratos.  El sitio web www.goodrx.com tiene cupones para medicamentos de Health and safety inspector. Los precios aqu no tienen en cuenta lo que podra costar con la ayuda del seguro (puede ser ms barato con su seguro), pero el sitio web puede darle el precio si no utiliz Tourist information centre manager.  - Puede imprimir el cupn correspondiente y llevarlo con su receta a la farmacia.  - Tambin puede pasar por nuestra oficina durante el horario de atencin regular y Education officer, museum una tarjeta de cupones de GoodRx.  - Si necesita que su receta se enve electrnicamente a una farmacia diferente, informe a nuestra oficina a travs de MyChart de [place] o por telfono llamando al [phone] y presione la opcin 4.

## 2023-04-22 NOTE — Progress Notes (Addendum)
Follow Up Visit   Subjective  Joseph Terry is a 62 y.o. male who presents for the following: Rash on arms. Itching on neck.  Patient states he was treated last year for eczema. Flared 2-3 weeks ago. Was outside on a Saturday but under a shade tree. Flared up Sunday. Mowed the following Wednesday and made it worse. Skin peeled off of both arms. Has been using Eucerin lotion, OTC anti-itch medications, other OTC moisturizers. Uses Triamcinolone ointment and cream. Cream dries up quickly. Doesn't help much.  Started loading dose of Dupixent 09/04/2022, stopped Dupixent in January 2024. Stated he had remarkably improved while on Dupixent. Was concerned about the price of the medication so he stopped taking it.  TRUE test showed positive reaction to [employer].  Bx on 09/04/2022 showed the following:  CHRONIC CONTACT OR NUMMULAR DERMATITIS    The following portions of the chart were reviewed this encounter and updated as appropriate: medications, allergies, medical history  Review of Systems:  No other skin or systemic complaints except as noted in HPI or Assessment and Plan.  Objective  Well appearing patient in no apparent distress; mood and affect are within normal limits.  A focused examination was performed of the following areas: Arms and neck  Relevant exam findings are noted in the Assessment and Plan.                  Assessment & Plan   Other eczema  Related Medications clobetasol cream (TEMOVATE) 0.05 % Apply twice a day as needed to affected areas for up to 2 weeks. Avoid applying to face, groin, and axilla.  Dupilumab SOPN 600 mg   Dupilumab (DUPIXENT) 300 MG/2ML SOPN Inject 300 mg into the skin every 14 (fourteen) days. Starting at day 15 for maintenance.  Chronic actinic dermatitis  Related Medications clobetasol cream (TEMOVATE) 0.05 % Apply twice a day as needed to affected areas for up to 2 weeks. Avoid applying to face, groin, and  axilla.  Dupilumab SOPN 600 mg   Dupilumab (DUPIXENT) 300 MG/2ML SOPN Inject 300 mg into the skin every 14 (fourteen) days. Starting at day 15 for maintenance.    Atopic dermatitis; chronic contact dermatitis or nummular dermatitis Exam: erythematous scaly lichenified plaques of bilateral dorsal hands, forearms, distal upper arms, neck/upper trunk, lower legs BSA 25%  Chronic and persistent condition with duration or expected duration over one year. Condition is bothersome/symptomatic for patient. Currently flared.   Treatment Plan: Dupixent 300 mg/2 ml x2 injected into B/L arms today for starter dose. Prescribed 300 mg every 14 days for maintenance Lot: [id number] Exp: 10/15/2024 NDC: 4098-1191-47  Apply clobetasol 0.05% cream twice a day as needed to affected areas for up to 2 weeks. Avoid applying to face, groin, and axilla. Use as directed. Long-term use can cause thinning of the skin.  Topical steroids (such as triamcinolone, fluocinolone, fluocinonide, mometasone, clobetasol, halobetasol, betamethasone, hydrocortisone) can cause thinning and lightening of the skin if they are used for too long in the same area. Your physician has selected the right strength medicine for your problem and area affected on the body. Please use your medication only as directed by your physician to prevent side effects.    Dupilumab (Dupixent) is a treatment given by injection for adults and children with moderate-to-severe atopic dermatitis. Goal is control of skin condition, not cure. It is given as 2 injections at the first dose followed by 1 injection ever 2 weeks thereafter.  Young children are  dosed monthly.  Potential side effects include allergic reaction, herpes infections, injection site reactions and conjunctivitis (inflammation of the eyes).  The use of Dupixent requires long term medication management, including periodic office visits.    Return in about 2 weeks (around 05/06/2023) for Rash  Follow Up, Dupixent injection.  I, Lawson Radar, CMA, am acting as scribe for Elie Goody, MD.   Documentation: I have reviewed the above documentation for accuracy and completeness, and I agree with the above.  Elie Goody, MD

## 2023-05-06 ENCOUNTER — Encounter: Payer: Self-pay | Admitting: Dermatology

## 2023-05-06 ENCOUNTER — Ambulatory Visit (INDEPENDENT_AMBULATORY_CARE_PROVIDER_SITE_OTHER): Payer: BC Managed Care – PPO | Admitting: Dermatology

## 2023-05-06 VITALS — BP 132/66

## 2023-05-06 DIAGNOSIS — Z79899 Other long term (current) drug therapy: Secondary | ICD-10-CM

## 2023-05-06 DIAGNOSIS — L209 Atopic dermatitis, unspecified: Secondary | ICD-10-CM

## 2023-05-06 DIAGNOSIS — L2081 Atopic neurodermatitis: Secondary | ICD-10-CM

## 2023-05-06 MED ORDER — CLOBETASOL PROPIONATE 0.05 % EX CREA: 1.0000 | TOPICAL_CREAM | CUTANEOUS | 1 refills | Status: DC

## 2023-05-06 MED ORDER — DUPILUMAB 300 MG/2ML ~~LOC~~ SOSY
300.0000 mg | PREFILLED_SYRINGE | Freq: Once | SUBCUTANEOUS | Status: AC
Start: 2023-05-06 — End: 2023-05-06
  Administered 2023-05-06: 300 mg via SUBCUTANEOUS

## 2023-05-06 MED ORDER — DUPILUMAB 300 MG/2ML ~~LOC~~ SOSY
300.0000 mg | PREFILLED_SYRINGE | SUBCUTANEOUS | Status: AC
Start: 2023-05-06 — End: 2023-06-03

## 2023-05-06 NOTE — Patient Instructions (Signed)

## 2023-05-06 NOTE — Progress Notes (Signed)
   Follow-Up Visit   Subjective  Joseph Terry is a 62 y.o. male who presents for the following: Atopic Dermatitis, arms, neck, 2 wk f/u, started Dupixent 04/22/23, ran out of Clobetasol cr 6 days ago, itching improved after Dupixent injections but pt feels like he is flaring again and itching has started up again   The following portions of the chart were reviewed this encounter and updated as appropriate: medications, allergies, medical history  Review of Systems:  No other skin or systemic complaints except as noted in HPI or Assessment and Plan.  Objective  Well appearing patient in no apparent distress; mood and affect are within normal limits.  Areas Examined: Arms, neck  Relevant physical exam findings are noted in the Assessment and Plan.    Assessment & Plan   Atopic neurodermatitis  Related Medications dupilumab (DUPIXENT) prefilled syringe 300 mg   dupilumab (DUPIXENT) prefilled syringe 300 mg      ATOPIC DERMATITIS arms Exam: residual lichenified erythematous scaly plaques bil forearms <5% BSA with Dupixent, significantly improved  Chronic and persistent condition with duration or expected duration over one year. Condition is symptomatic/ bothersome to patient. Not currently at goal.   Atopic dermatitis - Severe, on Dupixent (biologic medication).  Atopic dermatitis (eczema) is a chronic, relapsing, pruritic condition that can significantly affect quality of life. It is often associated with allergic rhinitis and/or asthma and can require treatment with topical medications, phototherapy, or in severe cases biologic medications, which require long term medication management.    Treatment Plan: Cont Clobetasol cr qd/bid up to 5d/wk aa arms prn flares, avoid f/g/a Continue Dupixent 300 mg/10mL SQ QOW. Patient denies side effects.  Dupixent 300mg /26mL injected SQ into the R arm. Patient tolerated injection well. sample x 1 lot [id number] exp 03/2025   Potential  side effects include allergic reaction, herpes infections, injection site reactions and conjunctivitis (inflammation of the eyes).  The use of Dupixent requires long term medication management, including periodic office visits.    Long term medication management.  Patient is using long term (months to years) prescription medication  to control their dermatologic condition.  These medications require periodic monitoring to evaluate for efficacy and side effects and may require periodic laboratory monitoring.   Topical steroids (such as triamcinolone, fluocinolone, fluocinonide, mometasone, clobetasol, halobetasol, betamethasone, hydrocortisone) can cause thinning and lightening of the skin if they are used for too long in the same area. Your physician has selected the right strength medicine for your problem and area affected on the body. Please use your medication only as directed by your physician to prevent side effects.    Recommend gentle skin care.   Return in about 2 weeks (around 05/20/2023) for nurse Dupixent injection, 41m with Dr. Katrinka Blazing.  I, Ardis Rowan, RMA, am acting as scribe for Elie Goody, MD .   Documentation: I have reviewed the above documentation for accuracy and completeness, and I agree with the above.  Elie Goody, MD

## 2023-05-12 ENCOUNTER — Other Ambulatory Visit: Payer: Self-pay

## 2023-05-12 DIAGNOSIS — L578 Other skin changes due to chronic exposure to nonionizing radiation: Secondary | ICD-10-CM

## 2023-05-12 DIAGNOSIS — L308 Other specified dermatitis: Secondary | ICD-10-CM

## 2023-05-12 MED ORDER — DUPIXENT 300 MG/2ML ~~LOC~~ SOAJ: 300.0000 mg | SUBCUTANEOUS | 5 refills | Status: DC

## 2023-05-12 NOTE — Progress Notes (Signed)
Fax received from [facility] requesting pharmacy change from them to [facility]. Escripted

## 2023-05-19 ENCOUNTER — Other Ambulatory Visit: Payer: Self-pay | Admitting: Family Medicine

## 2023-05-19 DIAGNOSIS — E1159 Type 2 diabetes mellitus with other circulatory complications: Secondary | ICD-10-CM

## 2023-05-19 DIAGNOSIS — K2270 Barrett's esophagus without dysplasia: Secondary | ICD-10-CM

## 2023-05-20 ENCOUNTER — Ambulatory Visit: Payer: BC Managed Care – PPO

## 2023-05-20 DIAGNOSIS — L209 Atopic dermatitis, unspecified: Secondary | ICD-10-CM

## 2023-05-20 MED ORDER — DUPILUMAB 300 MG/2ML ~~LOC~~ SOSY
300.0000 mg | PREFILLED_SYRINGE | Freq: Once | SUBCUTANEOUS | Status: AC
Start: 2023-05-20 — End: 2023-05-20
  Administered 2023-05-20: 300 mg via SUBCUTANEOUS

## 2023-05-20 NOTE — Progress Notes (Signed)
Patient here today for two week Dupixent injection for Atopic Dermatitis.   Dupixent 300mg  injected into patients left upper arm. Patient tolerated procedure well.   Lot: [id number] Exp:03/14/2025  Evorn Gong RMA

## 2023-05-26 ENCOUNTER — Other Ambulatory Visit: Payer: Self-pay | Admitting: Family Medicine

## 2023-05-26 DIAGNOSIS — N401 Enlarged prostate with lower urinary tract symptoms: Secondary | ICD-10-CM

## 2023-05-26 DIAGNOSIS — E119 Type 2 diabetes mellitus without complications: Secondary | ICD-10-CM

## 2023-05-26 DIAGNOSIS — F418 Other specified anxiety disorders: Secondary | ICD-10-CM

## 2023-05-26 DIAGNOSIS — I10 Essential (primary) hypertension: Secondary | ICD-10-CM

## 2023-06-04 ENCOUNTER — Ambulatory Visit: Payer: BC Managed Care – PPO | Admitting: Dermatology

## 2023-06-04 ENCOUNTER — Encounter: Payer: Self-pay | Admitting: Dermatology

## 2023-06-04 DIAGNOSIS — L209 Atopic dermatitis, unspecified: Secondary | ICD-10-CM | POA: Diagnosis not present

## 2023-06-04 DIAGNOSIS — Z79899 Other long term (current) drug therapy: Secondary | ICD-10-CM

## 2023-06-04 MED ORDER — DUPILUMAB 300 MG/2ML ~~LOC~~ SOPN
300.0000 mg | PEN_INJECTOR | Freq: Once | SUBCUTANEOUS | Status: AC
Start: 2023-06-04 — End: 2023-06-04
  Administered 2023-06-04: 300 mg via SUBCUTANEOUS

## 2023-06-04 NOTE — Progress Notes (Signed)
   Follow-Up Visit   Subjective  Joseph Terry is a 62 y.o. male who presents for the following: Atopic Dermatitis at arms and neck. Patient is using clobetasol to areas that flare at left arm. No other areas have been bothering him.    Patient has been on Dupixent since 04/22/23. He has been approved and is waiting for shipment to be delivered.   The following portions of the chart were reviewed this encounter and updated as appropriate: medications, allergies, medical history  Review of Systems:  No other skin or systemic complaints except as noted in HPI or Assessment and Plan.  Objective  Well appearing patient in no apparent distress; mood and affect are within normal limits.  Areas Examined: arms  Relevant physical exam findings are noted in the Assessment and Plan.    Assessment & Plan   Atopic dermatitis, unspecified type  Related Medications Dupilumab SOPN 300 mg      ATOPIC DERMATITIS Exam: Scaly pink papules coalescing to plaques on distal medial forearms 1% BSA  Chronic and persistent condition with duration or expected duration over one year. Condition is symptomatic/ bothersome to patient. Improving on therapy. Not currently at goal.   Atopic dermatitis - Severe, on Dupixent (biologic medication).  Atopic dermatitis (eczema) is a chronic, relapsing, pruritic condition that can significantly affect quality of life. It is often associated with allergic rhinitis and/or asthma and can require treatment with topical medications, phototherapy, or in severe cases biologic medications, which require long term medication management.    Treatment Plan:  Continue Dupixent 300 mg/81mL SQ QOW. Patient denies side effects. Continue clobetasol cr 1-2 times daily prn flares. Avoid applying to face, groin, and axilla. Use as directed. Long-term use can cause thinning of the skin.  Patient self-injected sample of Dupixent 300mg /67mL SQ into the right upper arm. Patient  tolerated injection well.    Potential side effects include allergic reaction, herpes infections, injection site reactions and conjunctivitis (inflammation of the eyes).  The use of Dupixent requires long term medication management, including periodic office visits.    Long term medication management.  Patient is using long term (months to years) prescription medication  to control their dermatologic condition.  These medications require periodic monitoring to evaluate for efficacy and side effects and may require periodic laboratory monitoring.   Recommend gentle skin care.  Topical steroids (such as triamcinolone, fluocinolone, fluocinonide, mometasone, clobetasol, halobetasol, betamethasone, hydrocortisone) can cause thinning and lightening of the skin if they are used for too long in the same area. Your physician has selected the right strength medicine for your problem and area affected on the body. Please use your medication only as directed by your physician to prevent side effects.    Return in about 3 months (around 09/03/2023) for Dermatitis.  Anise Salvo, RMA, am acting as scribe for Elie Goody, MD .   Documentation: I have reviewed the above documentation for accuracy and completeness, and I agree with the above.  Elie Goody, MD

## 2023-06-04 NOTE — Patient Instructions (Signed)
Continue Dupixent 300 mg/73mL every 2 weeks.  Continue clobetasol cream 1-2 times daily as needed for flares. Avoid applying to face, groin, and axilla. Use as directed. Long-term use can cause thinning of the skin.  Dupilumab (Dupixent) is a treatment given by injection for adults and children with moderate-to-severe atopic dermatitis. Goal is control of skin condition, not cure. It is given as 2 injections at the first dose followed by 1 injection ever 2 weeks thereafter.  Young children are dosed monthly.  Potential side effects include allergic reaction, herpes infections, injection site reactions and conjunctivitis (inflammation of the eyes).  The use of Dupixent requires long term medication management, including periodic office visits.  Topical steroids (such as triamcinolone, fluocinolone, fluocinonide, mometasone, clobetasol, halobetasol, betamethasone, hydrocortisone) can cause thinning and lightening of the skin if they are used for too long in the same area. Your physician has selected the right strength medicine for your problem and area affected on the body. Please use your medication only as directed by your physician to prevent side effects.   Due to recent changes in healthcare laws, you may see results of your pathology and/or laboratory studies on MyChart before the doctors have had a chance to review them. We understand that in some cases there may be results that are confusing or concerning to you. Please understand that not all results are received at the same time and often the doctors may need to interpret multiple results in order to provide you with the best plan of care or course of treatment. Therefore, we ask that you please give [place] 2 business days to thoroughly review all your results before contacting the office for clarification. Should we see a critical lab result, you will be contacted sooner.   If You Need Anything After Your Visit  If you have any questions or  concerns for your doctor, please call our main line at [phone] and press option 4 to reach your doctor's medical assistant. If no one answers, please leave a voicemail as directed and we will return your call as soon as possible. Messages left after 4 pm will be answered the following business day.   You may also send [place] a message via MyChart. We typically respond to MyChart messages within 1-2 business days.  For prescription refills, please ask your pharmacy to contact our office. Our fax number is [phone].  If you have an urgent issue when the clinic is closed that cannot wait until the next business day, you can page your doctor at the number below.    Please note that while we do our best to be available for urgent issues outside of office hours, we are not available 24/7.   If you have an urgent issue and are unable to reach [place], you may choose to seek medical care at your doctor's office, retail clinic, urgent care center, or emergency room.  If you have a medical emergency, please immediately call 911 or go to the emergency department.  Pager Numbers  - Dr. Gwen Pounds: [phone]  - Dr. Roseanne Reno: [phone]  - Dr. Katrinka Blazing: [phone]   In the event of inclement weather, please call our main line at [phone] for an update on the status of any delays or closures.  Dermatology Medication Tips: Please keep the boxes that topical medications come in in order to help keep track of the instructions about where and how to use these. Pharmacies typically print the medication instructions only on the boxes and not directly on  the medication tubes.   If your medication is too expensive, please contact our office at [phone] option 4 or send [place] a message through MyChart.   We are unable to tell what your co-pay for medications will be in advance as this is different depending on your insurance coverage. However, we may be able to find a substitute medication at lower cost  or fill out paperwork to get insurance to cover a needed medication.   If a prior authorization is required to get your medication covered by your insurance company, please allow [place] 1-2 business days to complete this process.  Drug prices often vary depending on where the prescription is filled and some pharmacies may offer cheaper prices.  The website www.goodrx.com contains coupons for medications through different pharmacies. The prices here do not account for what the cost may be with help from insurance (it may be cheaper with your insurance), but the website can give you the price if you did not use any insurance.  - You can print the associated coupon and take it with your prescription to the pharmacy.  - You may also stop by our office during regular business hours and pick up a GoodRx coupon card.  - If you need your prescription sent electronically to a different pharmacy, notify our office through [facility] or by phone at [phone] option 4.

## 2023-06-17 ENCOUNTER — Encounter: Payer: Self-pay | Admitting: Oncology

## 2023-06-17 ENCOUNTER — Ambulatory Visit
Admission: EM | Admit: 2023-06-17 | Discharge: 2023-06-17 | Disposition: A | Discharge status: Home / Self Care | Payer: BC Managed Care – PPO | Attending: Emergency Medicine | Admitting: Emergency Medicine

## 2023-06-17 DIAGNOSIS — M7989 Other specified soft tissue disorders: Secondary | ICD-10-CM | POA: Diagnosis not present

## 2023-06-17 MED ORDER — DEXAMETHASONE SODIUM PHOSPHATE 10 MG/ML IJ SOLN
10.0000 mg | Freq: Once | INTRAMUSCULAR | Status: AC
  Administered 2023-06-17: 10 mg via INTRAMUSCULAR

## 2023-06-17 MED ORDER — DICLOFENAC SODIUM 1 % EX GEL: 2.0000 g | Freq: Four times a day (QID) | CUTANEOUS | 0 refills | Status: DC

## 2023-06-17 MED ORDER — PREDNISONE 10 MG (21) PO TBPK: ORAL_TABLET | Freq: Every day | ORAL | 0 refills | Status: DC

## 2023-06-17 NOTE — ED Provider Notes (Signed)
Renaldo Fiddler    CSN: [id number] Arrival date & time: 06/17/23  6962      History   Chief Complaint Chief Complaint  Patient presents with   Finger Injury    HPI Joseph Terry is a 62 y.o. male.   Patient presents for evaluation of left thumb swelling and pain beginning 3 days ago.  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  Initially symptoms were just causing an achiness to the hands but have significantly worsened.  Has limited range of motion and is unable to fully bend the thumb.  Pain intermittently radiating into the forearm, interfering with sleep.  Has attempted use of Excedrin which has been ineffective.  Denies numbness or tingling.  Denies injury or trauma.  Past Medical History:  Diagnosis Date   Anxiety state, unspecified    Barrett's esophagus    CAD (coronary artery disease), severe multivessel CAD surgical consult for CABG    a. 12/2011 Cath: 3 vessel CAD with normal EF;  b. 12/2011 CABG x 5: LIMA to LAD, SVG to distal RCA with sequential SVG to PDA, SVG to OM, and SVG to D2;  c. 03/2014 ETT: Ex time 6:21, 1mm horizontal ST dep in V3-V6, HTN response.   Depression    Diaphragmatic hernia without mention of obstruction or gangrene    Diverticulosis    GERD (gastroesophageal reflux disease)    Hyperlipidemia    Hypertension    Impotence of organic origin    Internal hemorrhoids without mention of complication    Iron deficiency anemia due to chronic blood loss 04/15/2017   Kidney stone    Motion sickness    cars, boats   OSA on CPAP    a. Sleep study 07/2013 +sever OSA; CPAP at 10cm recommended.   Personal history of colonic polyps    Type II diabetes mellitus (HCC)    Wears contact lenses     Patient Active Problem List   Diagnosis Date Noted   Chronically on benzodiazepine therapy 03/24/2023   Encounter for screening for lung cancer  08/26/2022   Rash 08/13/2022   Low HDL (under 40) 08/07/2021   Chronic idiopathic constipation 08/07/2021   BPH associated with nocturia 08/07/2021   Annual physical exam 08/07/2021   Tobacco abuse, in remission 08/07/2021   Essential (hemorrhagic) thrombocythemia (HCC) 01/20/2020   Anxiety 07/08/2017   Diabetes mellitus (HCC) 07/08/2017   Polyp of sigmoid colon    Iron deficiency anemia due to chronic blood loss 04/15/2017   Anemia 12/26/2013   Barrett's esophagus 12/01/2013   History of colonic polyps 10/19/2013   OSA on CPAP 09/20/2013   GERD (gastroesophageal reflux disease) 02/28/2013   Coronary artery disease involving coronary bypass graft of native heart with angina pectoris (HCC) 08/30/2012   Depression with anxiety 05/31/2012   Hypertension associated with diabetes (HCC)    Hyperlipidemia associated with type 2 diabetes mellitus (HCC)    History of tobacco abuse 01/06/2012   S/P CABG x 5 01/05/2012   Cardiovascular stress test abnormal 01/02/2012   CAD at cath, Nl LVF, severe multivessel CAD surgical consult for CABG 01/02/2012    Past Surgical History:  Procedure Laterality Date   CARDIAC CATHETERIZATION  4 /19/ 2013   [facility]    CHOLECYSTECTOMY  11/03/13   COLONOSCOPY  11/01/13   multiple polyps; diverticulosis. Sankar. Repeat 5 years.  COLONOSCOPY W/ POLYPECTOMY  09/16/2007   colon polyps.  Repeat 5 years. Iftikhar.   COLONOSCOPY WITH PROPOFOL N/A 06/04/2017   Procedure: COLONOSCOPY WITH PROPOFOL;  Surgeon: Midge Minium, MD;  Location: [facility];  Service: Gastroenterology;  Laterality: N/A;  needs to remain first patient stated he also suppose to get EGD also   CORONARY ARTERY BYPASS GRAFT  01/05/2012   Procedure: CORONARY ARTERY BYPASS GRAFTING (CABG);  Surgeon: Purcell Nails, MD;  Location: [facility] OR;  Service: Open Heart Surgery;  Laterality: N/A;  coronary artery bypass graft times five using left internal mammary artery and right leg greater  saphenous vein harvested endoscopically    ESOPHAGOGASTRODUODENOSCOPY N/A 06/04/2017   PT NEEDS REPEAT IN 06/2020/BARRETT'S ESOPHAGUS - Surgeon: Midge Minium, MD   GIVENS CAPSULE STUDY N/A 06/22/2017   Procedure: GIVENS CAPSULE STUDY;  Surgeon: Midge Minium, MD;  Location: [facility] ENDOSCOPY;  Service: Endoscopy;  Laterality: N/A;   LEFT HEART CATHETERIZATION WITH CORONARY ANGIOGRAM N/A 01/02/2012   Procedure: LEFT HEART CATHETERIZATION WITH CORONARY ANGIOGRAM;  Surgeon: [name], MD;  Location: [facility] CATH LAB;  Service: Cardiovascular;  Laterality: N/A;   POLYPECTOMY N/A 06/04/2017   Procedure: POLYPECTOMY;  Surgeon: Midge Minium, MD;  Location: [facility];  Service: Gastroenterology;  Laterality: N/A;   POLYSOMNOGRAPHY  07/2013   +severe OSA; CPAP at 10cm pressure recommended.   rotator cuff curgery  2011   lt rotator cuff   Shoulder surgery  1997   bilateral   WISDOM TOOTH EXTRACTION         Home Medications    Prior to Admission medications   Medication Sig Start Date End Date Taking? Authorizing Provider  diclofenac Sodium (VOLTAREN) 1 % GEL Apply 2 g topically 4 (four) times daily. 06/17/23  Yes Klein Willcox R, NP  predniSONE (STERAPRED UNI-PAK 21 TAB) 10 MG (21) TBPK tablet Take by mouth daily. Take 6 tabs by mouth daily  for 1 days, then 5 tabs for 1 days, then 4 tabs for 1 days, then 3 tabs for 1 days, 2 tabs for 1 days, then 1 tab by mouth daily for 1 days 06/17/23  Yes Jalayah Gutridge R, NP  clobetasol cream (TEMOVATE) 0.05 % Apply 1 Application topically as directed. qd to bid aa eczema on arms until clear, then prn flares, avoid face, groin, axilla 05/06/23   Elie Goody, MD  clonazePAM (KLONOPIN) 0.5 MG tablet Take 1 tablet (0.5 mg total) by mouth daily as needed for anxiety. 04/01/23   Jacky Kindle, FNP  Dupilumab (DUPIXENT) 300 MG/2ML SOPN Inject 300 mg into the skin every 14 (fourteen) days. Starting at day 15 for maintenance. 05/12/23   Elie Goody,  MD  FARXIGA 10 MG TABS tablet TAKE 1 TABLET DAILY 05/20/23   Jacky Kindle, FNP  fenofibrate (TRICOR) 145 MG tablet TAKE 1 TABLET DAILY 05/27/23   Merita Norton T, FNP  ferrous sulfate 325 (65 FE) MG EC tablet Take 1 tablet (325 mg total) by mouth 2 (two) times daily with breakfast and lunch. 04/15/22   Jacky Kindle, FNP  glipiZIDE (GLUCOTROL XL) 10 MG 24 hr tablet TAKE 1 TABLET DAILY WITH BREAKFAST 05/27/23   Jacky Kindle, FNP  metFORMIN (GLUCOPHAGE) 1000 MG tablet Take 1 tablet (1,000 mg total) by mouth 2 (two) times daily with a meal. 06/02/22   Jacky Kindle, FNP  metoprolol tartrate (LOPRESSOR) 25 MG tablet TAKE 1 TABLET TWICE A DAY 05/27/23   Jacky Kindle, FNP  omeprazole (PRILOSEC) 40 MG capsule TAKE 1 CAPSULE DAILY 05/20/23   Merita Norton T, FNP  rosuvastatin (CRESTOR) 40 MG tablet Take 1 tablet (40 mg total) by mouth daily. 12/04/22   Jacky Kindle, FNP  tamsulosin (FLOMAX) 0.4 MG CAPS capsule TAKE 1 CAPSULE DAILY 05/27/23   Jacky Kindle, FNP  venlafaxine XR (EFFEXOR-XR) 150 MG 24 hr capsule TAKE 1 CAPSULE DAILY WITH BREAKFAST 05/27/23   Jacky Kindle, FNP    Family History Family History  Problem Relation Age of Onset   Heart attack Mother        defribillator in place   Hypertension Mother    Hyperlipidemia Mother    Heart disease Mother        CHF with defibrillator;  CAD.   Diabetes Mother    Cancer Mother 94       Lung cancer   Hypertension Father    Hyperlipidemia Father    Cancer Father 47        prostate cancer s/p  implants   COPD Father    Hypertension Sister    Heart disease Maternal Grandmother    Heart disease Maternal Grandfather    Cancer Paternal Grandmother    Cancer Paternal Grandfather    Hypertension Sister     Social History Social History   Tobacco Use   Smoking status: Former    Current packs/day: 0.00    Average packs/day: 1.5 packs/day for 35.0 years (52.5 ttl pk-yrs)    Types: Cigarettes    Start date: 11/13/1976    Quit date: 11/14/2011     Years since quitting: 11.5   Smokeless tobacco: Never   Tobacco comments:    quit 2 weeks prior to CABG 12/2011  Vaping Use   Vaping status: Never Used  Substance Use Topics   Alcohol use: Yes    Alcohol/week: 1.0 standard drink of alcohol    Types: 1 Cans of beer per week    Comment: Drinks once per month; beer.   Drug use: No     Allergies   Tramadol   Review of Systems Review of Systems   Physical Exam Triage Vital Signs ED Triage Vitals  Encounter Vitals Group     BP 06/17/23 0841 (!) 151/72     Systolic BP Percentile --      Diastolic BP Percentile --      Pulse Rate 06/17/23 0841 67     Resp --      Temp 06/17/23 0841 97.8 F (36.6 C)     Temp Source 06/17/23 0841 Temporal     SpO2 06/17/23 0841 95 %     Weight --      Height --      Head Circumference --      Peak Flow --      Pain Score 06/17/23 0835 8     Pain Loc --      Pain Education --      Exclude from Growth Chart --    No data found.  Updated Vital Signs BP (!) 151/72 (BP Location: Right Arm)   Pulse 67   Temp 97.8 F (36.6 C) (Temporal)   SpO2 95%   Visual Acuity Right Eye Distance:   Left Eye Distance:   Bilateral Distance:    Right Eye Near:   Left Eye Near:    Bilateral Near:     Physical Exam Constitutional:      Appearance: Normal appearance.  Eyes:  Extraocular Movements: Extraocular movements intact.  Pulmonary:     Effort: Pulmonary effort is normal.  Musculoskeletal:     Comments: Severe swelling to the base of the first metacarpal of the left hand, tender to palpation, no erythema deformity noted, limited range of motion, unable to fully flex but able to extend, sensation intact, capillary refill less than 3, 2+ radial pulse  Neurological:     Mental Status: He is alert and oriented to person, place, and time. Mental status is at baseline.      UC Treatments / Results  Labs (all labs ordered are listed, but only abnormal results are displayed) Labs Reviewed  - No data to display  EKG   Radiology No results found.  Procedures Procedures (including critical care time)  Medications Ordered in UC Medications  dexamethasone (DECADRON) injection 10 mg (has no administration in time range)    Initial Impression / Assessment and Plan / UC Course  I have reviewed the triage vital signs and the nursing notes.  Pertinent labs & imaging results that were available during my care of the patient were reviewed by me and considered in my medical decision making (see chart for details).  Swelling of left thumb  Most likely inflammatory cause related to arthritis and overuse, discussed with patient, in agreement, will defer imaging as there is a lack of injury, Decadron injection given prior to discharge, prescribed prednisone taper, history of diabetes, well-controlled, discussed effects on diabetes and advised to monitor at home, recommended heat, elevation with activity as tolerated, may use brace for additional support as needed, work note given, recommended use of low-dose ibuprofen and prescribed Voltaren gel to be used prophylactically prior to work to prevent reoccurrence Final Clinical Impressions(s) / UC Diagnoses   Final diagnoses:  Swelling of left thumb     Discharge Instructions      Today you were evaluated for your thumb pain which is most likely an arthritic flare due to overuse of your thumb  Today you have been given an injection of Decadron which is a steroid to help reduce inflammation, ideally will start to see symptoms improved within 30 minutes to an hour  Starting tomorrow take prednisone every morning as directed to continue the above process, may use Tylenol 500 to 1000 mg every 6 hours as needed for additional pain relief  Once steroids have been completed you may prophylactically take 400 mg of ibuprofen every morning you may use Voltaren gel every morning or every evening ideally to prevent reoccurrence while  production is increased at work then you may use as needed  Use heat in 10 to 15-minute intervals to help reduce swelling  Elevate on pillows whenever sitting and lying for comfort and to reduce swelling  You may continue activity as tolerated  When not at work you may use wrist and hand brace for additional support and comfort  You may follow-up with this urgent care as needed   ED Prescriptions     Medication Sig Dispense Auth. Provider   predniSONE (STERAPRED UNI-PAK 21 TAB) 10 MG (21) TBPK tablet Take by mouth daily. Take 6 tabs by mouth daily  for 1 days, then 5 tabs for 1 days, then 4 tabs for 1 days, then 3 tabs for 1 days, 2 tabs for 1 days, then 1 tab by mouth daily for 1 days 21 tablet Arryana Tolleson R, NP   diclofenac Sodium (VOLTAREN) 1 % GEL Apply 2 g topically 4 (four) times daily. 50  g Valinda Hoar, NP      PDMP not reviewed this encounter.   Valinda Hoar, [place] 06/17/23 [phone]

## 2023-06-17 NOTE — ED Triage Notes (Signed)
Patient presents to [facility] for left thumb swelling from constant use of hand at work. Reports noted pain and swelling don Sunday, worse the past 3 days. Treating pain with Excedrin.

## 2023-06-17 NOTE — Discharge Instructions (Addendum)
Today you were evaluated for your thumb pain which is most likely an arthritic flare due to overuse of your thumb  Today you have been given an injection of Decadron which is a steroid to help reduce inflammation, ideally will start to see symptoms improved within 30 minutes to an hour  Starting tomorrow take prednisone every morning as directed to continue the above process, may use Tylenol 500 to 1000 mg every 6 hours as needed for additional pain relief  Once steroids have been completed you may prophylactically take 400 mg of ibuprofen every morning you may use Voltaren gel every morning or every evening ideally to prevent reoccurrence while production is increased at work then you may use as needed  Use heat in 10 to 15-minute intervals to help reduce swelling  Elevate on pillows whenever sitting and lying for comfort and to reduce swelling  You may continue activity as tolerated  When not at work you may use wrist and hand brace for additional support and comfort  You may follow-up with this urgent care as needed

## 2023-08-12 ENCOUNTER — Other Ambulatory Visit: Payer: Self-pay | Admitting: Family Medicine

## 2023-08-12 DIAGNOSIS — E119 Type 2 diabetes mellitus without complications: Secondary | ICD-10-CM

## 2023-09-01 ENCOUNTER — Ambulatory Visit: Payer: BC Managed Care – PPO | Admitting: Family Medicine

## 2023-09-01 ENCOUNTER — Encounter: Payer: Self-pay | Admitting: Family Medicine

## 2023-09-01 VITALS — BP 123/65 | HR 58 | Ht 70.5 in | Wt 151.0 lb

## 2023-09-01 DIAGNOSIS — Z860100 Personal history of colon polyps, unspecified: Secondary | ICD-10-CM

## 2023-09-01 DIAGNOSIS — E1169 Type 2 diabetes mellitus with other specified complication: Secondary | ICD-10-CM | POA: Diagnosis not present

## 2023-09-01 DIAGNOSIS — N401 Enlarged prostate with lower urinary tract symptoms: Secondary | ICD-10-CM

## 2023-09-01 DIAGNOSIS — R351 Nocturia: Secondary | ICD-10-CM | POA: Diagnosis not present

## 2023-09-01 DIAGNOSIS — Z79899 Other long term (current) drug therapy: Secondary | ICD-10-CM

## 2023-09-01 DIAGNOSIS — Z125 Encounter for screening for malignant neoplasm of prostate: Secondary | ICD-10-CM

## 2023-09-01 DIAGNOSIS — Z122 Encounter for screening for malignant neoplasm of respiratory organs: Secondary | ICD-10-CM

## 2023-09-01 DIAGNOSIS — E1159 Type 2 diabetes mellitus with other circulatory complications: Secondary | ICD-10-CM | POA: Diagnosis not present

## 2023-09-01 DIAGNOSIS — E785 Hyperlipidemia, unspecified: Secondary | ICD-10-CM

## 2023-09-01 DIAGNOSIS — I152 Hypertension secondary to endocrine disorders: Secondary | ICD-10-CM

## 2023-09-01 DIAGNOSIS — D473 Essential (hemorrhagic) thrombocythemia: Secondary | ICD-10-CM

## 2023-09-01 DIAGNOSIS — D500 Iron deficiency anemia secondary to blood loss (chronic): Secondary | ICD-10-CM | POA: Diagnosis not present

## 2023-09-01 DIAGNOSIS — K2270 Barrett's esophagus without dysplasia: Secondary | ICD-10-CM

## 2023-09-01 DIAGNOSIS — Z0000 Encounter for general adult medical examination without abnormal findings: Secondary | ICD-10-CM

## 2023-09-01 DIAGNOSIS — I25709 Atherosclerosis of coronary artery bypass graft(s), unspecified, with unspecified angina pectoris: Secondary | ICD-10-CM

## 2023-09-01 DIAGNOSIS — F418 Other specified anxiety disorders: Secondary | ICD-10-CM

## 2023-09-01 DIAGNOSIS — G4733 Obstructive sleep apnea (adult) (pediatric): Secondary | ICD-10-CM

## 2023-09-01 DIAGNOSIS — F17201 Nicotine dependence, unspecified, in remission: Secondary | ICD-10-CM

## 2023-09-01 DIAGNOSIS — Z0001 Encounter for general adult medical examination with abnormal findings: Secondary | ICD-10-CM

## 2023-09-01 MED ORDER — CLONAZEPAM 0.5 MG PO TABS
0.5000 mg | ORAL_TABLET | Freq: Every day | ORAL | 0 refills | Status: DC | PRN
Start: 2023-09-01 — End: 2023-12-01

## 2023-09-01 NOTE — Progress Notes (Signed)
Complete physical exam   Patient: Joseph Terry   DOB: 09-16-1960   62 y.o. Male  MRN: [id number] Visit Date: 09/01/2023  Today's healthcare provider: Jacky Kindle, FNP  Introduced to nurse practitioner role and practice setting.  All questions answered.  Discussed provider/patient relationship and expectations.  Chief Complaint  Patient presents with   Annual Exam   Subjective    Joseph Terry is a 62 y.o. male who presents today for a complete physical exam.  He reports consuming a diabetic, reduced sugar calorie diet. The patient has a physically strenuous job, but has no regular exercise apart from work.  He generally feels fairly well. He reports sleeping fairly well. He does have additional problems to discuss today.  HPI   Due for colon cancer screening and eye appt; plans to follow up with Dr Morene Rankins  Past Medical History:  Diagnosis Date   Anxiety state, unspecified    Barrett's esophagus    CAD (coronary artery disease), severe multivessel CAD surgical consult for CABG    a. 12/2011 Cath: 3 vessel CAD with normal EF;  b. 12/2011 CABG x 5: LIMA to LAD, SVG to distal RCA with sequential SVG to PDA, SVG to OM, and SVG to D2;  c. 03/2014 ETT: Ex time 6:21, 1mm horizontal ST dep in V3-V6, HTN response.   Depression    Diaphragmatic hernia without mention of obstruction or gangrene    Diverticulosis    GERD (gastroesophageal reflux disease)    Hyperlipidemia    Hypertension    Impotence of organic origin    Internal hemorrhoids without mention of complication    Iron deficiency anemia due to chronic blood loss 04/15/2017   Kidney stone    Motion sickness    cars, boats   OSA on CPAP    a. Sleep study 07/2013 +sever OSA; CPAP at 10cm recommended.   Personal history of colonic polyps    Type II diabetes mellitus (HCC)    Wears contact lenses    Past Surgical History:  Procedure Laterality Date   CARDIAC CATHETERIZATION  4 /19/ 2013   [facility]     CHOLECYSTECTOMY  11/03/13   COLONOSCOPY  11/01/13   multiple polyps; diverticulosis. Sankar. Repeat 5 years.   COLONOSCOPY W/ POLYPECTOMY  09/16/2007   colon polyps.  Repeat 5 years. Iftikhar.   COLONOSCOPY WITH PROPOFOL N/A 06/04/2017   Procedure: COLONOSCOPY WITH PROPOFOL;  Surgeon: Midge Minium, MD;  Location: [facility];  Service: Gastroenterology;  Laterality: N/A;  needs to remain first patient stated he also suppose to get EGD also   CORONARY ARTERY BYPASS GRAFT  01/05/2012   Procedure: CORONARY ARTERY BYPASS GRAFTING (CABG);  Surgeon: Purcell Nails, MD;  Location[facility];  Service: Open Heart Surgery;  Laterality: N/A;  coronary artery bypass graft times five using left internal mammary artery and right leg greater saphenous vein harvested endoscopically    ESOPHAGOGASTRODUODENOSCOPY N/A 06/04/2017   PT NEEDS REPEAT IN 06/2020/BARRETT'S ESOPHAGUS - Surgeon: Midge Minium, MD   GIVENS CAPSULE STUDY N/A 06/22/2017   Procedure: GIVENS CAPSULE STUDY;  Surgeon: Midge Minium, MD;  Location: [facility] ENDOSCOPY;  Service: Endoscopy;  Laterality: N/A;   LEFT HEART CATHETERIZATION WITH CORONARY ANGIOGRAM N/A 01/02/2012   Procedure: LEFT HEART CATHETERIZATION WITH CORONARY ANGIOGRAM;  Surgeon: [name], MD;  Location: [facility] CATH LAB;  Service: Cardiovascular;  Laterality: N/A;   POLYPECTOMY N/A 06/04/2017   Procedure: POLYPECTOMY;  Surgeon: Midge Minium, MD;  Location: [facility];  Service: Gastroenterology;  Laterality: N/A;   POLYSOMNOGRAPHY  07/2013   +severe OSA; CPAP at 10cm pressure recommended.   rotator cuff curgery  2011   lt rotator cuff   Shoulder surgery  1997   bilateral   WISDOM TOOTH EXTRACTION     Social History   Socioeconomic History   Marital status: Divorced    Spouse name: Not on file   Number of children: 1   Years of education: 12   Highest education level: GED or equivalent  Occupational History   Occupation: Doctor, hospital: [employer]     Comment: x 30 yrs  Tobacco Use   Smoking status: Former    Current packs/day: 0.00    Average packs/day: 1.5 packs/day for 35.0 years (52.5 ttl pk-yrs)    Types: Cigarettes    Start date: 11/13/1976    Quit date: 11/14/2011    Years since quitting: 11.8   Smokeless tobacco: Never   Tobacco comments:    quit 2 weeks prior to CABG 12/2011  Vaping Use   Vaping status: Never Used  Substance and Sexual Activity   Alcohol use: Yes    Alcohol/week: 1.0 standard drink of alcohol    Types: 1 Cans of beer per week    Comment: Drinks once per month; beer.   Drug use: No   Sexual activity: Yes    Partners: Female    Birth control/protection: Condom  Other Topics Concern   Not on file  Social History Narrative   Marital status: divorced since 1996; dating casually in 2016.        Lives: Lives alone.          Children:  One son and a granddaughter 75 years old live in [place].       Tobacco: quit with CABG      Alcohol: weekends; 6-8 beers on average.  Beers with football; ten on football weekends.      Exercise: Light, yard work.        Employment:  Works 45-50 hrs. Some weeks.  [employer].  Desk work since 05/2013. Since 1979.      Guns:  Guns in the home not stored in locked cabinet. Smoke alarm in home, Always wears seatbelts.       Sexual History: active;Last HIV testing 2015; has has 25+ partners; females only.            Social Drivers of Corporate investment banker Strain: Low Risk  (08/31/2023)   Overall Financial Resource Strain (CARDIA)    Difficulty of Paying Living Expenses: Not very hard  Food Insecurity: No Food Insecurity (08/31/2023)   Hunger Vital Sign    Worried About Running Out of Food in the Last Year: Never true    Ran Out of Food in the Last Year: Never true  Transportation Needs: No Transportation Needs (08/31/2023)   PRAPARE - Administrator, Civil Service (Medical): No    Lack of Transportation (Non-Medical): No  Physical Activity:  Insufficiently Active (08/31/2023)   Exercise Vital Sign    Days of Exercise per Week: 2 days    Minutes of Exercise per Session: 10 min  Stress: Stress Concern Present (08/31/2023)   [employer] of Occupational Health - Occupational Stress Questionnaire    Feeling of Stress : To some extent  Social Connections: Socially Isolated (08/31/2023)   Social Connection and Isolation Panel [NHANES]    Frequency of Communication  with Friends and Family: Twice a week    Frequency of Social Gatherings with Friends and Family: Once a week    Attends Religious Services: Never    Database administrator or Organizations: No    Attends Engineer, structural: Not on file    Marital Status: Divorced  Intimate Partner Violence: Not on file   Family Status  Relation Name Status   Mother  Deceased at age 39       lung cancer; Defibrillator; age 66.   Father  Deceased at age 65       COPD   Sister  Alive   MGM  Deceased   MGF  Deceased   PGM  Deceased   PGF  Deceased   Sister  Alive   Sister  Alive   Brother  Alive   Brother  Alive   Brother  Alive   Son  Alive  No partnership data on file   Family History  Problem Relation Age of Onset   Heart attack Mother        defribillator in place   Hypertension Mother    Hyperlipidemia Mother    Heart disease Mother        CHF with defibrillator;  CAD.   Diabetes Mother    Cancer Mother 52       Lung cancer   Hypertension Father    Hyperlipidemia Father    Cancer Father 6        prostate cancer s/p  implants   COPD Father    Hypertension Sister    Heart disease Maternal Grandmother    Heart disease Maternal Grandfather    Cancer Paternal Grandmother    Cancer Paternal Grandfather    Hypertension Sister    Allergies  Allergen Reactions   Tramadol Rash    Patient Care Team: Pardue, Monico Blitz, DO as PCP - General (Family Medicine) Susa Griffins, MD (Inactive) as Consulting Physician (Cardiology) Ethelda Chick, MD  as Consulting Physician (Family Medicine) Kieth Brightly, MD (General Surgery) Kathrin Greathouse, MD (Oral Surgery) Gaspar Cola, RPH (Inactive) (Pharmacist) Thurnell Garbe, OD (Optometry)   Medications: Outpatient Medications Prior to Visit  Medication Sig   clobetasol cream (TEMOVATE) 0.05 % Apply 1 Application topically as directed. qd to bid aa eczema on arms until clear, then prn flares, avoid face, groin, axilla   diclofenac Sodium (VOLTAREN) 1 % GEL Apply 2 g topically 4 (four) times daily.   Dupilumab (DUPIXENT) 300 MG/2ML SOPN Inject 300 mg into the skin every 14 (fourteen) days. Starting at day 15 for maintenance.   FARXIGA 10 MG TABS tablet TAKE 1 TABLET DAILY   fenofibrate (TRICOR) 145 MG tablet TAKE 1 TABLET DAILY   ferrous sulfate 325 (65 FE) MG EC tablet Take 1 tablet (325 mg total) by mouth 2 (two) times daily with breakfast and lunch.   glipiZIDE (GLUCOTROL XL) 10 MG 24 hr tablet TAKE 1 TABLET DAILY WITH BREAKFAST   metFORMIN (GLUCOPHAGE) 1000 MG tablet TAKE 1 TABLET TWICE A DAY WITH MEALS   metoprolol tartrate (LOPRESSOR) 25 MG tablet TAKE 1 TABLET TWICE A DAY   omeprazole (PRILOSEC) 40 MG capsule TAKE 1 CAPSULE DAILY   rosuvastatin (CRESTOR) 40 MG tablet Take 1 tablet (40 mg total) by mouth daily.   tamsulosin (FLOMAX) 0.4 MG CAPS capsule TAKE 1 CAPSULE DAILY   venlafaxine XR (EFFEXOR-XR) 150 MG 24 hr capsule TAKE 1 CAPSULE DAILY WITH BREAKFAST   [DISCONTINUED] clonazePAM (  KLONOPIN) 0.5 MG tablet Take 1 tablet (0.5 mg total) by mouth daily as needed for anxiety.   [DISCONTINUED] predniSONE (STERAPRED UNI-PAK 21 TAB) 10 MG (21) TBPK tablet Take by mouth daily. Take 6 tabs by mouth daily  for 1 days, then 5 tabs for 1 days, then 4 tabs for 1 days, then 3 tabs for 1 days, 2 tabs for 1 days, then 1 tab by mouth daily for 1 days   No facility-administered medications prior to visit.   Last CBC Lab Results  Component Value Date   WBC 5.9 09/01/2023   HGB  14.3 09/01/2023   HCT 44.9 09/01/2023   MCV 90 09/01/2023   MCH 28.7 09/01/2023   RDW 14.1 09/01/2023   PLT 309 09/01/2023   Last metabolic panel Lab Results  Component Value Date   GLUCOSE 201 (H) 09/01/2023   NA 139 09/01/2023   K 4.5 09/01/2023   CL 100 09/01/2023   CO2 24 09/01/2023   BUN 15 09/01/2023   CREATININE 0.75 (L) 09/01/2023   EGFR 101 09/01/2023   CALCIUM 10.0 09/01/2023   PROT 6.9 09/01/2023   ALBUMIN 4.6 09/01/2023   LABGLOB 2.3 09/01/2023   AGRATIO 2.4 (H) 08/26/2022   BILITOT 0.3 09/01/2023   ALKPHOS 86 09/01/2023   AST 18 09/01/2023   ALT 24 09/01/2023   ANIONGAP 11 11/25/2017   Last lipids Lab Results  Component Value Date   CHOL 200 (H) 09/01/2023   HDL 42 09/01/2023   LDLCALC 127 (H) 09/01/2023   TRIG 172 (H) 09/01/2023   CHOLHDL 4.8 09/01/2023   Last hemoglobin A1c Lab Results  Component Value Date   HGBA1C 9.9 (H) 09/01/2023   Last thyroid functions Lab Results  Component Value Date   TSH 2.200 09/01/2023   Last vitamin D Lab Results  Component Value Date   VD25OH 11.6 (L) 09/01/2023   Last vitamin B12 and Folate Lab Results  Component Value Date   VITAMINB12 531 09/01/2023   FOLATE 8.2 09/01/2023    Objective    BP 123/65 (BP Location: Right Arm, Patient Position: Sitting, Cuff Size: Normal)   Pulse (!) 58   Ht 5' 10.5" (1.791 m)   Wt 151 lb (68.5 kg)   BMI 21.36 kg/m   BP Readings from Last 3 Encounters:  09/01/23 123/65  06/17/23 (!) 151/72  05/06/23 132/66   Wt Readings from Last 3 Encounters:  09/01/23 151 lb (68.5 kg)  03/24/23 157 lb (71.2 kg)  08/26/22 156 lb (70.8 kg)   SpO2 Readings from Last 3 Encounters:  06/17/23 95%  03/24/23 98%  08/26/22 98%   Physical Exam Vitals and nursing note reviewed.  Constitutional:      General: He is awake. He is not in acute distress.    Appearance: Normal appearance. He is well-developed, well-groomed and normal weight. He is not ill-appearing, toxic-appearing  or diaphoretic.  HENT:     Head: Normocephalic and atraumatic.     Jaw: There is normal jaw occlusion. No trismus, tenderness, swelling or pain on movement.     Salivary Glands: Right salivary gland is not diffusely enlarged or tender. Left salivary gland is not diffusely enlarged or tender.     Right Ear: Hearing, tympanic membrane, ear canal and external ear normal. There is no impacted cerumen.     Left Ear: Hearing, tympanic membrane, ear canal and external ear normal. There is no impacted cerumen.     Nose: Nose normal. No congestion or rhinorrhea.  Right Turbinates: Not enlarged, swollen or pale.     Left Turbinates: Not enlarged, swollen or pale.     Right Sinus: No maxillary sinus tenderness or frontal sinus tenderness.     Left Sinus: No maxillary sinus tenderness or frontal sinus tenderness.     Mouth/Throat:     Lips: Pink.     Mouth: Mucous membranes are moist. No injury, lacerations, oral lesions or angioedema.     Pharynx: Oropharynx is clear. Uvula midline. No pharyngeal swelling, oropharyngeal exudate or posterior oropharyngeal erythema.     Tonsils: No tonsillar exudate or tonsillar abscesses.  Eyes:     General: Lids are normal. Vision grossly intact. Gaze aligned appropriately.        Right eye: No discharge.        Left eye: No discharge.     Extraocular Movements: Extraocular movements intact.     Conjunctiva/sclera: Conjunctivae normal.     Pupils: Pupils are equal, round, and reactive to light.  Neck:     Thyroid: No thyroid mass, thyromegaly or thyroid tenderness.     Vascular: No carotid bruit.     Trachea: Trachea normal. No tracheal tenderness.  Cardiovascular:     Rate and Rhythm: Normal rate and regular rhythm.     Pulses: Normal pulses.          Carotid pulses are 2+ on the right side and 2+ on the left side.      Radial pulses are 2+ on the right side and 2+ on the left side.       Femoral pulses are 2+ on the right side and 2+ on the left side.       Popliteal pulses are 2+ on the right side and 2+ on the left side.       Dorsalis pedis pulses are 2+ on the right side and 2+ on the left side.       Posterior tibial pulses are 2+ on the right side and 2+ on the left side.     Heart sounds: Normal heart sounds, S1 normal and S2 normal. No murmur heard.    No friction rub. No gallop.  Pulmonary:     Effort: Pulmonary effort is normal. No respiratory distress.     Breath sounds: Normal breath sounds and air entry. No stridor. No wheezing, rhonchi or rales.  Chest:     Chest wall: No tenderness.  Abdominal:     General: Abdomen is flat. Bowel sounds are normal. There is no distension.     Palpations: Abdomen is soft. There is no mass.     Tenderness: There is no abdominal tenderness. There is no guarding or rebound.     Hernia: No hernia is present.  Genitourinary:    Comments: Exam deferred; denies complaints Musculoskeletal:        General: No swelling, tenderness, deformity or signs of injury. Normal range of motion.     Cervical back: Normal range of motion and neck supple. No rigidity or tenderness.     Right lower leg: No edema.     Left lower leg: No edema.  Lymphadenopathy:     Cervical: No cervical adenopathy.     Right cervical: No superficial, deep or posterior cervical adenopathy.    Left cervical: No superficial, deep or posterior cervical adenopathy.  Skin:    General: Skin is warm and dry.     Capillary Refill: Capillary refill takes less than 2 seconds.     Coloration:  Skin is not jaundiced or pale.     Findings: No bruising, erythema, lesion or rash.  Neurological:     General: No focal deficit present.     Mental Status: He is alert and oriented to person, place, and time. Mental status is at baseline.     GCS: GCS eye subscore is 4. GCS verbal subscore is 5. GCS motor subscore is 6.     Sensory: Sensation is intact. No sensory deficit.     Motor: Motor function is intact. No weakness.     Coordination:  Coordination is intact.     Gait: Gait is intact.  Psychiatric:        Attention and Perception: Attention and perception normal.        Mood and Affect: Mood and affect normal.        Speech: Speech normal.        Behavior: Behavior normal. Behavior is cooperative.        Thought Content: Thought content normal.        Cognition and Memory: Cognition normal.        Judgment: Judgment normal.     Last depression screening scores    09/01/2023   10:39 AM 03/24/2023    3:48 PM 08/26/2022    3:43 PM  PHQ 2/9 Scores  PHQ - 2 Score 1 2 1   PHQ- 9 Score 2 9 3    Last fall risk screening    09/01/2023   10:39 AM  Fall Risk   Falls in the past year? 0  Number falls in past yr: 0  Injury with Fall? 0   Last Audit-C alcohol use screening    08/31/2023   11:03 AM  Alcohol Use Disorder Test (AUDIT)  1. How often do you have a drink containing alcohol? 1  2. How many drinks containing alcohol do you have on a typical day when you are drinking? 1  3. How often do you have six or more drinks on one occasion? 0  AUDIT-C Score 2      Patient-reported   A score of 3 or more in women, and 4 or more in men indicates increased risk for alcohol abuse, EXCEPT if all of the points are from question 1   Results for orders placed or performed in visit on 09/01/23  CBC with Differential/Platelet  Result Value Ref Range   WBC 5.9 3.4 - 10.8 x10E3/uL   RBC 4.99 4.14 - 5.80 x10E6/uL   Hemoglobin 14.3 13.0 - 17.7 g/dL   Hematocrit 46.9 62.9 - 51.0 %   MCV 90 79 - 97 fL   MCH 28.7 26.6 - 33.0 pg   MCHC 31.8 31.5 - 35.7 g/dL   RDW 52.8 41.3 - 24.4 %   Platelets 309 150 - 450 x10E3/uL   Neutrophils 71 Not Estab. %   Lymphs 18 Not Estab. %   Monocytes 6 Not Estab. %   Eos 4 Not Estab. %   Basos 1 Not Estab. %   Neutrophils Absolute 4.2 1.4 - 7.0 x10E3/uL   Lymphocytes Absolute 1.0 0.7 - 3.1 x10E3/uL   Monocytes Absolute 0.3 0.1 - 0.9 x10E3/uL   EOS (ABSOLUTE) 0.2 0.0 - 0.4 x10E3/uL    Basophils Absolute 0.1 0.0 - 0.2 x10E3/uL   Immature Granulocytes 0 Not Estab. %   Immature Grans (Abs) 0.0 0.0 - 0.1 x10E3/uL  Comprehensive metabolic panel  Result Value Ref Range   Glucose 201 (H) 70 - 99 mg/dL  BUN 15 8 - 27 mg/dL   Creatinine, Ser 9.81 (L) 0.76 - 1.27 mg/dL   eGFR 191 >47 WG/NFA/2.13   BUN/Creatinine Ratio 20 10 - 24   Sodium 139 134 - 144 mmol/L   Potassium 4.5 3.5 - 5.2 mmol/L   Chloride 100 96 - 106 mmol/L   CO2 24 20 - 29 mmol/L   Calcium 10.0 8.6 - 10.2 mg/dL   Total Protein 6.9 6.0 - 8.5 g/dL   Albumin 4.6 3.9 - 4.9 g/dL   Globulin, Total 2.3 1.5 - 4.5 g/dL   Bilirubin Total 0.3 0.0 - 1.2 mg/dL   Alkaline Phosphatase 86 44 - 121 IU/L   AST 18 0 - 40 IU/L   ALT 24 0 - 44 IU/L  Lipid panel  Result Value Ref Range   Cholesterol, Total 200 (H) 100 - 199 mg/dL   Triglycerides 086 (H) 0 - 149 mg/dL   HDL 42 >57 mg/dL   VLDL Cholesterol Cal 31 5 - 40 mg/dL   LDL Chol Calc (NIH) 846 (H) 0 - 99 mg/dL   Chol/HDL Ratio 4.8 0.0 - 5.0 ratio  TSH  Result Value Ref Range   TSH 2.200 0.450 - 4.500 uIU/mL  PSA  Result Value Ref Range   Prostate Specific Ag, Serum 0.4 0.0 - 4.0 ng/mL  Hemoglobin A1c  Result Value Ref Range   Hgb A1c MFr Bld 9.9 (H) 4.8 - 5.6 %   Est. average glucose Bld gHb Est-mCnc 237 mg/dL  Urine Microalbumin w/creat. ratio  Result Value Ref Range   Creatinine, Urine 34.8 Not Estab. mg/dL   Microalbumin, Urine <9.6 Not Estab. ug/mL   Microalb/Creat Ratio <9 0 - 29 mg/g creat  Iron, TIBC and Ferritin Panel  Result Value Ref Range   Total Iron Binding Capacity 323 250 - 450 ug/dL   UIBC 295 284 - 132 ug/dL   Iron 440 38 - 102 ug/dL   Iron Saturation 33 15 - 55 %   Ferritin 97 30 - 400 ng/mL  Vitamin D (25 hydroxy)  Result Value Ref Range   Vit D, 25-Hydroxy 11.6 (L) 30.0 - 100.0 ng/mL  B12 and Folate Panel  Result Value Ref Range   Vitamin B-12 531 232 - 1,245 pg/mL   Folate 8.2 >3.0 ng/mL  Lipoprotein A (LPA)  Result Value Ref  Range   Lipoprotein (a) 103.3 (H) <75.0 nmol/L    Assessment & Plan    Routine Health Maintenance and Physical Exam  Exercise Activities and Dietary recommendations  Goals   None     Immunization History  Administered Date(s) Administered   Hepatitis B, ADULT 06/18/2015, 10/05/2015   PFIZER(Purple Top)SARS-COV-2 Vaccination 12/15/2019, 01/05/2020, 08/01/2020   Pfizer(Comirnaty)Fall Seasonal Vaccine 12 years and older 08/26/2022   Pneumococcal Conjugate-13 08/14/2014   Pneumococcal Polysaccharide-23 06/18/2015   Pneumococcal-Unspecified 09/15/2004   Td 09/16/2007   Tdap 04/04/2019    Health Maintenance  Topic Date Due   Zoster Vaccines- Shingrix (1 of 2) Never done   Lung Cancer Screening  Never done   OPHTHALMOLOGY EXAM  12/24/2021   Colonoscopy  06/04/2022   COVID-19 Vaccine (5 - 2024-25 season) 05/17/2023   INFLUENZA VACCINE  12/14/2023 (Originally 04/16/2023)   HEMOGLOBIN A1C  11/30/2023   Pneumococcal Vaccine 26-55 Years old (3 of 3 - PPSV23 or PCV20) 06/12/2024   Diabetic kidney evaluation - eGFR measurement  08/31/2024   Diabetic kidney evaluation - Urine ACR  08/31/2024   FOOT EXAM  08/31/2024   DTaP/Tdap/Td (  3 - Td or Tdap) 04/03/2029   Hepatitis C Screening  Completed   HIV Screening  Completed   HPV VACCINES  Aged Out    Discussed health benefits of physical activity, and encouraged him to engage in regular exercise appropriate for his age and condition.  Problem List Items Addressed This Visit       Cardiovascular and Mediastinum   Coronary artery disease involving coronary bypass graft of native heart with angina pectoris (HCC)   Chronic, stable LDL goal of <50 Continues on crestor 40      Relevant Orders   CBC with Differential/Platelet (Completed)   Comprehensive metabolic panel (Completed)   Lipid panel (Completed)   Hemoglobin A1c (Completed)   Lipoprotein A (LPA) (Completed)   Hypertension associated with diabetes (HCC)   Chronic,  borderline Goal remains <119/<79 Continues on farxiga 10 every day, metop 25 mg bid      Relevant Orders   CBC with Differential/Platelet (Completed)   Comprehensive metabolic panel (Completed)   TSH (Completed)     Respiratory   OSA on CPAP   Chronic, stable Followed by pulm      Relevant Orders   CBC with Differential/Platelet (Completed)   Comprehensive metabolic panel (Completed)     Digestive   Barrett's esophagus   Chronic, stable Followed by GI Continue on prilosec 40 mg daily      Relevant Orders   Ambulatory referral to Gastroenterology     Endocrine   Diabetes mellitus (HCC)   Chronic, goal of <7% Uncontrolled based on A1c Continue to recommend balanced, lower carb meals. Smaller meal size, adding snacks. Choosing water as drink of choice and increasing purposeful exercise. Daily use of glipizide 10 mg; consider BID dosing in addition to glipizide Would likely benefit from once daily long acting insulin if patient is willing Continue to recommend balanced, lower carb meals. Smaller meal size, adding snacks. Choosing water as drink of choice and increasing purposeful exercise.       Relevant Orders   Hemoglobin A1c (Completed)   Urine Microalbumin w/creat. ratio (Completed)   Hyperlipidemia associated with type 2 diabetes mellitus (HCC)   Chronic, previously elevated ASCVD event risk elevated given previous risk factors Consider start of repatha to assist       Relevant Orders   Lipid panel (Completed)     Hematopoietic and Hemostatic   Essential (hemorrhagic) thrombocythemia (HCC)   Chronic, repeat labs Denies concerns for bleeding or bruising at this time Remains on daily iron to assist      Relevant Orders   CBC with Differential/Platelet (Completed)   Iron, TIBC and Ferritin Panel (Completed)     Other   Annual physical exam - Primary   Things to do to keep yourself healthy  - Exercise at least 30-45 minutes a day, 3-4 days a week.  -  Eat a low-fat diet with lots of fruits and vegetables, up to 7-9 servings per day.  - Seatbelts can save your life. Wear them always.  - Smoke detectors on every level of your home, check batteries every year.  - Eye Doctor - have an eye exam every 1-2 years  - Safe sex - if you may be exposed to STDs, use a condom.  - Alcohol -  If you drink, do it moderately, less than 2 drinks per day.  - Health Care Power of Attorney. Choose someone to speak for you if you are not able.  - Depression is common in our stressful  world.If you're feeling down or losing interest in things you normally enjoy, please come in for a visit.  - Violence - If anyone is threatening or hurting you, please call immediately.       Relevant Orders   CBC with Differential/Platelet (Completed)   Comprehensive metabolic panel (Completed)   Lipid panel (Completed)   TSH (Completed)   PSA (Completed)   BPH associated with nocturia   Chronic, stable Remains on flomax 0.4 nightly Repeat PSA      Relevant Orders   PSA (Completed)   Chronically on benzodiazepine therapy   Chronic, request for continued use of klonopin to assist daily effexor 150 Denies SI or SA or HI or HA PDMP reviewed Pt is aware of risks of psychoactive medication use to include increased sedation, respiratory suppression, falls, extrapyramidal movements,  dependence and cardiovascular events.  Pt would like to continue treatment as benefit determined to outweigh risk.         Relevant Medications   clonazePAM (KLONOPIN) 0.5 MG tablet   Depression with anxiety   Chronic, stable    09/01/2023   10:39 AM 02/01/2021    8:23 AM 02/01/2021    8:22 AM 12/26/2016    8:19 AM  GAD 7 : Generalized Anxiety Score  Nervous, Anxious, on Edge 1 3 3 3   Control/stop worrying 1 3 3  0  Worry too much - different things 1 3 3  0  Trouble relaxing 0 3 3 0  Restless 0 0 0 0  Easily annoyed or irritable 1 3 3 1   Afraid - awful might happen 0 0 0 0  Total GAD 7  Score 4 15 15 4   Anxiety Difficulty Not difficult at all Very difficult Somewhat difficult Not difficult at all       09/01/2023   10:39 AM 03/24/2023    3:48 PM 08/26/2022    3:43 PM  PHQ9 SCORE ONLY  PHQ-9 Total Score 2 9 3    remains on daily controller via effexor at 150 with PRN klonopin to assist flares       Relevant Medications   clonazePAM (KLONOPIN) 0.5 MG tablet   Encounter for screening for lung cancer   Relevant Orders   Ambulatory Referral Lung Cancer Screening [facility]   History of colonic polyps   Relevant Orders   Ambulatory referral to Gastroenterology   Iron deficiency anemia due to chronic blood loss   Chronic, stable Repeat iron panel; remains on daily supplements      Relevant Orders   CBC with Differential/Platelet (Completed)   Iron, TIBC and Ferritin Panel (Completed)   Vitamin D (25 hydroxy) (Completed)   B12 and Folate Panel (Completed)   Tobacco abuse, in remission   Congratulated on remission state; continue to follow lung function       Other Visit Diagnoses       Encounter for screening prostate specific antigen (PSA) measurement       Relevant Orders   Hemoglobin A1c (Completed)      Return in about 3 months (around 11/30/2023) for chonic disease management.    Leilani Merl, FNP, have reviewed all documentation for this visit. The documentation on 09/11/23 for the exam, diagnosis, procedures, and orders are all accurate and complete.  Jacky Kindle, FNP  [facility] [phone] (phone) [phone] (fax)  [facility]

## 2023-09-02 LAB — CBC WITH DIFFERENTIAL/PLATELET
Basophils Absolute: 0.1 10*3/uL (ref 0.0–0.2)
Basos: 1 %
EOS (ABSOLUTE): 0.2 10*3/uL (ref 0.0–0.4)
Eos: 4 %
Hematocrit: 44.9 % (ref 37.5–51.0)
Hemoglobin: 14.3 g/dL (ref 13.0–17.7)
Immature Grans (Abs): 0 10*3/uL (ref 0.0–0.1)
Immature Granulocytes: 0 %
Lymphocytes Absolute: 1 10*3/uL (ref 0.7–3.1)
Lymphs: 18 %
MCH: 28.7 pg (ref 26.6–33.0)
MCHC: 31.8 g/dL (ref 31.5–35.7)
MCV: 90 fL (ref 79–97)
Monocytes Absolute: 0.3 10*3/uL (ref 0.1–0.9)
Monocytes: 6 %
Neutrophils Absolute: 4.2 10*3/uL (ref 1.4–7.0)
Neutrophils: 71 %
Platelets: 309 10*3/uL (ref 150–450)
RBC: 4.99 x10E6/uL (ref 4.14–5.80)
RDW: 14.1 % (ref 11.6–15.4)
WBC: 5.9 10*3/uL (ref 3.4–10.8)

## 2023-09-02 LAB — COMPREHENSIVE METABOLIC PANEL
ALT: 24 [IU]/L (ref 0–44)
AST: 18 [IU]/L (ref 0–40)
Albumin: 4.6 g/dL (ref 3.9–4.9)
Alkaline Phosphatase: 86 [IU]/L (ref 44–121)
BUN/Creatinine Ratio: 20 (ref 10–24)
BUN: 15 mg/dL (ref 8–27)
Bilirubin Total: 0.3 mg/dL (ref 0.0–1.2)
CO2: 24 mmol/L (ref 20–29)
Calcium: 10 mg/dL (ref 8.6–10.2)
Chloride: 100 mmol/L (ref 96–106)
Creatinine, Ser: 0.75 mg/dL — ABNORMAL LOW (ref 0.76–1.27)
Globulin, Total: 2.3 g/dL (ref 1.5–4.5)
Glucose: 201 mg/dL — ABNORMAL HIGH (ref 70–99)
Potassium: 4.5 mmol/L (ref 3.5–5.2)
Sodium: 139 mmol/L (ref 134–144)
Total Protein: 6.9 g/dL (ref 6.0–8.5)
eGFR: 101 mL/min/{1.73_m2} (ref >59)

## 2023-09-02 LAB — LIPID PANEL
Chol/HDL Ratio: 4.8 {ratio} (ref 0.0–5.0)
Cholesterol, Total: 200 mg/dL — ABNORMAL HIGH (ref 100–199)
HDL: 42 mg/dL (ref >39)
LDL Chol Calc (NIH): 127 mg/dL — ABNORMAL HIGH (ref 0–99)
Triglycerides: 172 mg/dL — ABNORMAL HIGH (ref 0–149)
VLDL Cholesterol Cal: 31 mg/dL (ref 5–40)

## 2023-09-02 LAB — B12 AND FOLATE PANEL
Folate: 8.2 ng/mL (ref >3.0)
Vitamin B-12: 531 pg/mL (ref 232–1245)

## 2023-09-02 LAB — IRON,TIBC AND FERRITIN PANEL
Ferritin: 97 ng/mL (ref 30–400)
Iron Saturation: 33 % (ref 15–55)
Iron: 106 ug/dL (ref 38–169)
Total Iron Binding Capacity: 323 ug/dL (ref 250–450)
UIBC: 217 ug/dL (ref 111–343)

## 2023-09-02 LAB — PSA: Prostate Specific Ag, Serum: 0.4 ng/mL (ref 0.0–4.0)

## 2023-09-02 LAB — MICROALBUMIN / CREATININE URINE RATIO
Creatinine, Urine: 34.8 mg/dL
Microalb/Creat Ratio: <9 mg/g{creat} (ref 0–29)
Microalbumin, Urine: <3 ug/mL

## 2023-09-02 LAB — TSH: TSH: 2.2 u[IU]/mL (ref 0.450–4.500)

## 2023-09-02 LAB — VITAMIN D 25 HYDROXY (VIT D DEFICIENCY, FRACTURES): Vit D, 25-Hydroxy: 11.6 ng/mL — ABNORMAL LOW (ref 30.0–100.0)

## 2023-09-02 LAB — HEMOGLOBIN A1C
Est. average glucose Bld gHb Est-mCnc: 237 mg/dL
Hgb A1c MFr Bld: 9.9 % — ABNORMAL HIGH (ref 4.8–5.6)

## 2023-09-02 LAB — LIPOPROTEIN A (LPA): Lipoprotein (a): 103.3 nmol/L — ABNORMAL HIGH (ref <75.0)

## 2023-09-02 NOTE — Progress Notes (Signed)
Cholesterol panel shows increased cholesterol with an increase in LDL as well. The 10-year ASCVD risk score (Arnett DK, et al., 2019) is: 25.6% with elevated LP(a) which shows genetic increase for heart attack and stroke. Crestor 40 was previously used; recommend addition of zetia vs repatha to further assist. Given LP(a) recommend Repatha. Will need PA.  Diabetes is also UNCONTROLLED; now at 9.9%. Given your previous use of high dose metformin with glipizide at 10. Recommend increase in glipize to 25 to continue with farxiga 10. 30#r2.  Continue to recommend balanced, lower carb meals. Smaller meal size, adding snacks. Choosing water as drink of choice and increasing purposeful exercise.  Recommend high dose Vit D or 5000 IU Vit D3 OTC daily. 13r1

## 2023-09-03 ENCOUNTER — Other Ambulatory Visit: Payer: Self-pay | Admitting: Family Medicine

## 2023-09-03 DIAGNOSIS — E11649 Type 2 diabetes mellitus with hypoglycemia without coma: Secondary | ICD-10-CM

## 2023-09-04 ENCOUNTER — Telehealth: Payer: Self-pay

## 2023-09-04 NOTE — Progress Notes (Signed)
Care Guide Pharmacy Note  09/04/2023 Name: Joseph Terry MRN: [id number] DOB: 08-22-1961  Referred By: Sherlyn Hay, DO Reason for referral: Care Coordination (Outreach to schedule with Pharm d )   Joseph Terry is a 62 y.o. year old male who is a primary care patient of Pardue, LEA, ASA Berkery was referred to the pharmacist for assistance related to: HTN, HLD, and DMII  An unsuccessful telephone outreach was attempted today to contact the patient who was referred to the pharmacy team for assistance with medication management. Additional attempts will be made to contact the patient.  Penne Lash , RMA     [facility]  [facility], [facility] Guide  Direct Dial: [phone]  Website: [email or web address]

## 2023-09-11 NOTE — Assessment & Plan Note (Signed)
Chronic, repeat labs Denies concerns for bleeding or bruising at this time Remains on daily iron to assist

## 2023-09-11 NOTE — Assessment & Plan Note (Signed)
Chronic, stable    09/01/2023   10:39 AM 02/01/2021    8:23 AM 02/01/2021    8:22 AM 12/26/2016    8:19 AM  GAD 7 : Generalized Anxiety Score  Nervous, Anxious, on Edge 1 3 3 3   Control/stop worrying 1 3 3  0  Worry too much - different things 1 3 3  0  Trouble relaxing 0 3 3 0  Restless 0 0 0 0  Easily annoyed or irritable 1 3 3 1   Afraid - awful might happen 0 0 0 0  Total GAD 7 Score 4 15 15 4   Anxiety Difficulty Not difficult at all Very difficult Somewhat difficult Not difficult at all       09/01/2023   10:39 AM 03/24/2023    3:48 PM 08/26/2022    3:43 PM  PHQ9 SCORE ONLY  PHQ-9 Total Score 2 9 3    remains on daily controller via effexor at 150 with PRN klonopin to assist flares

## 2023-09-11 NOTE — Assessment & Plan Note (Signed)
Chronic, stable Repeat iron panel; remains on daily supplements

## 2023-09-11 NOTE — Assessment & Plan Note (Signed)

## 2023-09-11 NOTE — Assessment & Plan Note (Signed)
Chronic, borderline Goal remains <119/<79 Continues on farxiga 10 every day, metop 25 mg bid

## 2023-09-11 NOTE — Assessment & Plan Note (Signed)
Chronic, stable Remains on flomax 0.4 nightly Repeat PSA

## 2023-09-11 NOTE — Assessment & Plan Note (Signed)
Chronic, request for continued use of klonopin to assist daily effexor 150 Denies SI or SA or HI or HA PDMP reviewed Pt is aware of risks of psychoactive medication use to include increased sedation, respiratory suppression, falls, extrapyramidal movements,  dependence and cardiovascular events.  Pt would like to continue treatment as benefit determined to outweigh risk.

## 2023-09-11 NOTE — Assessment & Plan Note (Signed)
Chronic, stable Followed by pulm

## 2023-09-11 NOTE — Assessment & Plan Note (Signed)
Congratulated on remission state; continue to follow lung function

## 2023-09-11 NOTE — Assessment & Plan Note (Signed)
Chronic, stable LDL goal of <50 Continues on crestor 40

## 2023-09-11 NOTE — Assessment & Plan Note (Signed)
Chronic, goal of <7% Uncontrolled based on A1c Continue to recommend balanced, lower carb meals. Smaller meal size, adding snacks. Choosing water as drink of choice and increasing purposeful exercise. Daily use of glipizide 10 mg; consider BID dosing in addition to glipizide Would likely benefit from once daily long acting insulin if patient is willing Continue to recommend balanced, lower carb meals. Smaller meal size, adding snacks. Choosing water as drink of choice and increasing purposeful exercise.

## 2023-09-11 NOTE — Assessment & Plan Note (Signed)
Chronic, stable Followed by GI Continue on prilosec 40 mg daily

## 2023-09-11 NOTE — Assessment & Plan Note (Signed)
Chronic, previously elevated ASCVD event risk elevated given previous risk factors Consider start of repatha to assist

## 2023-09-15 ENCOUNTER — Encounter: Payer: Self-pay | Admitting: *Deleted

## 2023-09-21 ENCOUNTER — Encounter: Payer: Self-pay | Admitting: Dermatology

## 2023-09-21 ENCOUNTER — Ambulatory Visit (INDEPENDENT_AMBULATORY_CARE_PROVIDER_SITE_OTHER): Payer: BC Managed Care – PPO | Admitting: Dermatology

## 2023-09-21 DIAGNOSIS — Z79899 Other long term (current) drug therapy: Secondary | ICD-10-CM

## 2023-09-21 DIAGNOSIS — L209 Atopic dermatitis, unspecified: Secondary | ICD-10-CM | POA: Diagnosis not present

## 2023-09-21 NOTE — Progress Notes (Signed)
   Follow-Up Visit   Subjective  Joseph Terry is a 63 y.o. male who presents for the following: Atopic Dermatitis. 3 month follow up. Arms and neck. On Dupixent  since 04/22/2023. Injects himself at home every 2 weeks. Denies side effects or injection site reactions. Rash begins to flare when time for next injection. Rash has greatly improved since starting Dupixent . C/O burning sensation in the mornings. Itching has resolved. 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   Uses Clobetasol  cream as needed for stubborn areas.    The following portions of the chart were reviewed this encounter and updated as appropriate: medications, allergies, medical history  Review of Systems:  No other skin or systemic complaints except as noted in HPI or Assessment and Plan.  Objective  Well appearing patient in no apparent distress; mood and affect are within normal limits.  Areas Examined: Arms, neck  Relevant physical exam findings are noted in the Assessment and Plan.    Assessment & Plan   ATOPIC DERMATITIS, UNSPECIFIED TYPE   LONG-TERM USE OF HIGH-RISK MEDICATION     ATOPIC DERMATITIS, improved on Dupixent  with less rash, less pruritus, less disruption of activities of daily living Exam: erythematous slightly scaly patch on left medial forearm 2% BSA  Chronic and persistent condition with duration or expected duration over one year. Condition is symptomatic / bothersome to patient. Not to goal.   Atopic dermatitis - Severe, on Dupixent  (biologic medication).  Atopic dermatitis (eczema) is a chronic, relapsing, pruritic condition that can significantly affect quality of life. It is often associated with allergic rhinitis and/or asthma and can require treatment with topical medications, phototherapy, or in severe cases biologic medications, which require long term medication management.    Treatment Plan: Protect forearms from exposures at work, may have allergic contact  component Continue Dupixent  300 mg/2mL SQ QOW. Patient denies side effects. Continue clobetasol  cream BID PRN   Potential side effects include allergic reaction, herpes infections, injection site reactions and conjunctivitis (inflammation of the eyes).  The use of Dupixent  requires long term medication management, including periodic office visits.    Long term medication management.  Patient is using long term (months to years) prescription medication  to control their dermatologic condition.  These medications require periodic monitoring to evaluate for efficacy and side effects and may require periodic laboratory monitoring.   Recommend gentle skin care.  Topical steroids (such as triamcinolone , fluocinolone, fluocinonide, mometasone, clobetasol , halobetasol, betamethasone, hydrocortisone ) can cause thinning and lightening of the skin if they are used for too long in the same area. Your physician has selected the right strength medicine for your problem and area affected on the body. Please use your medication only as directed by your physician to prevent side effects.    Return in about 6 months (around 03/20/2024) for Atopic Dermatitis Follow Up, Dupixent  Follow Up.  I, Kate Fought, CMA, am acting as scribe for Boneta Sharps, MD.   Documentation: I have reviewed the above documentation for accuracy and completeness, and I agree with the above.  Boneta Sharps, MD

## 2023-09-21 NOTE — Patient Instructions (Addendum)
 Continue Dupixent  300 mg/2mL injection every 2 weeks.   Dupilumab  (Dupixent ) is a treatment given by injection for adults and children with moderate-to-severe atopic dermatitis. Goal is control of skin condition, not cure. It is given as 2 injections at the first dose followed by 1 injection ever 2 weeks thereafter.  Young children are dosed monthly.  Potential side effects include allergic reaction, herpes infections, injection site reactions and conjunctivitis (inflammation of the eyes).  The use of Dupixent  requires long term medication management, including periodic office visits.   Use Clobetasol  cream twice daily to active areas until smooth.  Avoid applying to face, groin, and axilla. Use as directed. Long-term use can cause thinning of the skin.  Topical steroids (such as triamcinolone , fluocinolone, fluocinonide, mometasone, clobetasol , halobetasol, betamethasone, hydrocortisone ) can cause thinning and lightening of the skin if they are used for too long in the same area. Your physician has selected the right strength medicine for your problem and area affected on the body. Please use your medication only as directed by your physician to prevent side effects.    Gentle Skin Care Guide  1. Bathe no more than once a day.  2. Avoid bathing in hot water   3. Use a mild soap like Dove, Vanicream, Cetaphil, CeraVe. Can use Lever 2000 or Cetaphil antibacterial soap  4. Use soap only where you need it. On most days, use it under your arms, between your legs, and on your feet. Let the water  rinse other areas unless visibly dirty.  5. When you get out of the bath/shower, use a towel to gently blot your skin dry, don't rub it.  6. While your skin is still a little damp, apply a moisturizing cream such as Vanicream, CeraVe, Cetaphil, Eucerin, Sarna lotion or plain Vaseline Jelly. For hands apply Neutrogena Norwegian Hand Cream or Excipial Hand Cream.  7. Reapply moisturizer any time you start  to itch or feel dry.  8. Sometimes using free and clear laundry detergents can be helpful. Fabric softener sheets should be avoided. Downy Free & Gentle liquid, or any liquid fabric softener that is free of dyes and perfumes, it acceptable to use  9. If your doctor has given you prescription creams you may apply moisturizers over them       Due to recent changes in healthcare laws, you may see results of your pathology and/or laboratory studies on MyChart before the doctors have had a chance to review them. We understand that in some cases there may be results that are confusing or concerning to you. Please understand that not all results are received at the same time and often the doctors may need to interpret multiple results in order to provide you with the best plan of care or course of treatment. Therefore, we ask that you please give us  2 business days to thoroughly review all your results before contacting the office for clarification. Should we see a critical lab result, you will be contacted sooner.   If You Need Anything After Your Visit  If you have any questions or concerns for your doctor, please call our main line at [phone] and press option 4 to reach your doctor's medical assistant. If no one answers, please leave a voicemail as directed and we will return your call as soon as possible. Messages left after 4 pm will be answered the following business day.   You may also send us  a message via MyChart. We typically respond to MyChart messages within 1-2 business days.  For prescription refills, please ask your pharmacy to contact our office. Our fax number is [phone].  If you have an urgent issue when the clinic is closed that cannot wait until the next business day, you can page your doctor at the number below.    Please note that while we do our best to be available for urgent issues outside of office hours, we are not available 24/7.   If you have an urgent issue  and are unable to reach us , you may choose to seek medical care at your doctor's office, retail clinic, urgent care center, or emergency room.  If you have a medical emergency, please immediately call 911 or go to the emergency department.  Pager Numbers  - Dr. Hester: [phone]  - Dr. Jackquline: [phone]  - Dr. Claudene: [phone]   In the event of inclement weather, please call our main line at [phone] for an update on the status of any delays or closures.  Dermatology Medication Tips: Please keep the boxes that topical medications come in in order to help keep track of the instructions about where and how to use these. Pharmacies typically print the medication instructions only on the boxes and not directly on the medication tubes.   If your medication is too expensive, please contact our office at [phone] option 4 or send us  a message through MyChart.   We are unable to tell what your co-pay for medications will be in advance as this is different depending on your insurance coverage. However, we may be able to find a substitute medication at lower cost or fill out paperwork to get insurance to cover a needed medication.   If a prior authorization is required to get your medication covered by your insurance company, please allow us  1-2 business days to complete this process.  Drug prices often vary depending on where the prescription is filled and some pharmacies may offer cheaper prices.  The website www.goodrx.com contains coupons for medications through different pharmacies. The prices here do not account for what the cost may be with help from insurance (it may be cheaper with your insurance), but the website can give you the price if you did not use any insurance.  - You can print the associated coupon and take it with your prescription to the pharmacy.  - You may also stop by our office during regular business hours and pick up a GoodRx coupon card.  - If you  need your prescription sent electronically to a different pharmacy, notify our office through [facility] or by phone at [phone] option 4.     Si Usted Necesita Algo Despus de Su Visita  Tambin puede enviarnos un mensaje a travs de Clinical Cytogeneticist. Por lo general respondemos a los mensajes de MyChart en el transcurso de 1 a 2 das hbiles.  Para renovar recetas, por favor pida a su farmacia que se ponga en contacto con nuestra oficina. Randi lakes de fax es [place] [phone].  Si tiene un asunto urgente cuando la clnica est cerrada y que no puede esperar hasta el siguiente da hbil, puede llamar/localizar a su doctor(a) al nmero que aparece a continuacin.   Por favor, tenga en cuenta que aunque hacemos todo lo posible para estar disponibles para asuntos urgentes fuera del horario de [place], no estamos disponibles las 24 horas del da, los 7 [address]  [address] de la [place].   Si tiene un problema urgente y no puede comunicarse con nosotros, puede optar por buscar atencin mdica  en el consultorio de su doctor(a), en una clnica privada, en un centro de atencin urgente o en una sala de emergencias.  Si tiene engineer, drilling, por favor llame inmediatamente al 911 o vaya a la sala de emergencias.  Nmeros de bper  - Dr. Hester: [phone]  - Dra. Jackquline: [phone]  - Dr. Claudene: [phone]   En caso de inclemencias del tiempo, por favor llame a landry capes principal al [phone] para una actualizacin sobre el [place] de cualquier retraso o cierre.  Consejos para la medicacin en dermatologa: Por favor, guarde las cajas en las que vienen los medicamentos de uso tpico para ayudarle a seguir las instrucciones sobre dnde y cmo usarlos. Las farmacias generalmente imprimen las instrucciones del medicamento slo en las cajas y no directamente en los tubos del [place].   Si su medicamento es muy caro, por favor, pngase en contacto con landry rieger llamando al  [phone] y presione la opcin 4 o envenos un mensaje a travs de Clinical Cytogeneticist.   No podemos decirle cul ser su copago por los medicamentos por adelantado ya que esto es diferente dependiendo de la cobertura de su seguro. Sin embargo, es posible que podamos encontrar un medicamento sustituto a audiological scientist un formulario para que el seguro cubra el medicamento que se considera necesario.   Si se requiere una autorizacin previa para que su compaa de seguros cubra su medicamento, por favor permtanos de 1 a 2 das hbiles para completar este proceso.  Los precios de los medicamentos varan con frecuencia dependiendo del environmental consultant de dnde se surte la receta y alguna farmacias pueden ofrecer precios ms baratos.  El sitio web www.goodrx.com tiene cupones para medicamentos de health and safety inspector. Los precios aqu no tienen en cuenta lo que podra costar con la ayuda del seguro (puede ser ms barato con su seguro), pero el sitio web puede darle el precio si no utiliz tourist information centre manager.  - Puede imprimir el cupn correspondiente y llevarlo con su receta a la farmacia.  - Tambin puede pasar por nuestra oficina durante el horario de atencin regular y education officer, museum una tarjeta de cupones de GoodRx.  - Si necesita que su receta se enve electrnicamente a una farmacia diferente, informe a nuestra oficina a travs de MyChart de [place] o por telfono llamando al [phone] y presione la opcin 4.

## 2023-09-22 ENCOUNTER — Ambulatory Visit: Payer: Self-pay | Admitting: Dermatology

## 2023-09-23 NOTE — Progress Notes (Signed)
 Care Guide Pharmacy Note  09/23/2023 Name: Joseph Terry MRN: [id number] DOB: 1961-04-05  Referred By: Donzella Lauraine SAILOR, DO Reason for referral: Care Coordination (Outreach to schedule with Pharm d )   Joseph Terry is a 63 y.o. year old male who is a primary care patient of Pardue, Lauraine SAILOR, DO.  Joseph Terry was referred to the pharmacist for assistance related to: HTN, HLD, and DMII  A second unsuccessful telephone outreach was attempted today to contact the patient who was referred to the pharmacy team for assistance with medication management. Additional attempts will be made to contact the patient.  Joseph Terry , RMA     [facility]  [facility], [facility] Guide  Direct Dial: [phone]  Website: [email or web address]

## 2023-09-29 NOTE — Progress Notes (Signed)
 Care Guide Pharmacy Note  09/29/2023 Name: Joseph Terry MRN: [id number] DOB: [birth year]  Referred By: Donzella Lauraine SAILOR, DO Reason for referral: Care Coordination (Outreach to schedule with Pharm d )   Joseph Terry is a 63 y.o. year old male who is a primary care patient of Pardue, Lauraine SAILOR, DO.  Joseph Terry was referred to the pharmacist for assistance related to: HTN, HLD, and DMII  A third unsuccessful telephone outreach was attempted today to contact the patient who was referred to the pharmacy team for assistance with medication management. The Population Health team is pleased to engage with this patient at any time in the future upon receipt of referral and should he/she be interested in assistance from the [employer] Health team.  [name] , RMA     [facility]  [facility], [facility] Guide  Direct Dial: [phone]  Website: [email or web address]

## 2023-10-28 NOTE — Progress Notes (Signed)
This encounter was created in error - please disregard.

## 2023-11-02 ENCOUNTER — Other Ambulatory Visit: Payer: Self-pay | Admitting: Dermatology

## 2023-11-02 ENCOUNTER — Encounter: Payer: Self-pay | Admitting: Dermatology

## 2023-11-02 DIAGNOSIS — L578 Other skin changes due to chronic exposure to nonionizing radiation: Secondary | ICD-10-CM

## 2023-11-02 DIAGNOSIS — L308 Other specified dermatitis: Secondary | ICD-10-CM

## 2023-11-02 MED ORDER — DUPIXENT 300 MG/2ML ~~LOC~~ SOAJ: 300.0000 mg | SUBCUTANEOUS | 10 refills | Status: DC

## 2023-11-04 ENCOUNTER — Telehealth: Payer: Self-pay

## 2023-11-04 NOTE — Telephone Encounter (Signed)
Patient was identified as falling into the [place] Measure - Diabetes.   Patient was: Appointment scheduled with primary care provider in the next 30 days.  Patient seen in December.  PCP left practice. Patient set up with new PCP.

## 2023-11-10 ENCOUNTER — Telehealth: Payer: Self-pay | Admitting: Family Medicine

## 2023-11-10 ENCOUNTER — Other Ambulatory Visit: Payer: Self-pay

## 2023-11-10 MED ORDER — ROSUVASTATIN CALCIUM 40 MG PO TABS: 40.0000 mg | ORAL_TABLET | Freq: Every day | ORAL | 3 refills | Status: DC

## 2023-11-10 NOTE — Telephone Encounter (Signed)
Rx has been sent to requested pharmacy 

## 2023-11-10 NOTE — Telephone Encounter (Signed)
 [facility] is requesting refill rosuvastatin (CRESTOR) 40 MG tablet  Please advise

## 2023-11-30 ENCOUNTER — Ambulatory Visit: Payer: BC Managed Care – PPO | Admitting: Family Medicine

## 2023-12-01 ENCOUNTER — Ambulatory Visit: Payer: BC Managed Care – PPO | Admitting: Family Medicine

## 2023-12-01 VITALS — BP 148/65 | HR 58 | Resp 16 | Ht 70.5 in | Wt 155.0 lb

## 2023-12-01 DIAGNOSIS — I152 Hypertension secondary to endocrine disorders: Secondary | ICD-10-CM

## 2023-12-01 DIAGNOSIS — F418 Other specified anxiety disorders: Secondary | ICD-10-CM

## 2023-12-01 DIAGNOSIS — E1159 Type 2 diabetes mellitus with other circulatory complications: Secondary | ICD-10-CM | POA: Diagnosis not present

## 2023-12-01 DIAGNOSIS — E1169 Type 2 diabetes mellitus with other specified complication: Secondary | ICD-10-CM

## 2023-12-01 DIAGNOSIS — M2550 Pain in unspecified joint: Secondary | ICD-10-CM | POA: Diagnosis not present

## 2023-12-01 DIAGNOSIS — Z1211 Encounter for screening for malignant neoplasm of colon: Secondary | ICD-10-CM

## 2023-12-01 DIAGNOSIS — E785 Hyperlipidemia, unspecified: Secondary | ICD-10-CM

## 2023-12-01 DIAGNOSIS — Z79899 Other long term (current) drug therapy: Secondary | ICD-10-CM

## 2023-12-01 DIAGNOSIS — Z23 Encounter for immunization: Secondary | ICD-10-CM | POA: Diagnosis not present

## 2023-12-01 DIAGNOSIS — Z7984 Long term (current) use of oral hypoglycemic drugs: Secondary | ICD-10-CM

## 2023-12-01 MED ORDER — METHYLPREDNISOLONE 8 MG PO TABS: 24.0000 mg | ORAL_TABLET | Freq: Every day | ORAL | 0 refills | Status: DC

## 2023-12-01 MED ORDER — CLONAZEPAM 0.5 MG PO TABS: 0.5000 mg | ORAL_TABLET | Freq: Every day | ORAL | 2 refills | Status: DC | PRN

## 2023-12-01 NOTE — Progress Notes (Unsigned)
 Established patient visit   Patient: Joseph Terry   DOB: 1961/03/15   63 y.o. Male  MRN: [id number] Visit Date: 12/01/2023  Today's healthcare provider: Sherlyn Hay, DO   Chief Complaint  Patient presents with  . Diabetes  . Arthritis    Bilateral hands?   Subjective    HPI Joseph Terry "Joseph Terry" is a 63 year old male with diabetes who presents for follow-up and hand pain.  He experiences a gradual onset of hand pain, described as a dull, thudding sensation with tingling and low-grade shock-like feelings. The pain is primarily all over his hands, worsening at night and interfering with sleep. 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 The pain can last all day, especially during regular job duties, but is less severe when filling in for a different role. Previous treatments for arthritis in his thumb with a steroid injection and prednisone tablets provided some relief but did not completely resolve the symptoms. He currently takes acetaminophen for pain management, typically two tablets in the morning, and has not used Voltaren gel.  He has a history of diabetes and is on rosuvastatin for cholesterol management, which he has been taking for a considerable time along with fenofibrate. He mentions a previous A1c test that showed an unexpected increase. 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  He has a history of eczema for which he uses Dupixent. He is also on venlafaxine and clonazepam, the latter of which he takes at a reduced dose of half a tablet daily due to previous refill issues. He is currently out of iron supplements, which he usually takes at a dose of 325 mg.  In terms of family history, there is no known history of rheumatoid arthritis or autoimmune diseases.  Socially, he quit smoking in 2013 after a 52 pack-year history. He has not had a full eye exam recently due to concerns about driving post-dilation, and he is due for a colonoscopy, having last had one in 2018 with a history of polyps.     ***  {History (Optional):23778}  Medications: Outpatient Medications Prior to Visit  Medication Sig  . clobetasol cream (TEMOVATE) 0.05 % Apply 1 Application topically as directed. qd to bid aa eczema on arms until clear, then prn flares, avoid face, groin, axilla  . diclofenac Sodium (VOLTAREN) 1 % GEL Apply 2 g topically 4 (four) times daily.  . Dupilumab (DUPIXENT) 300 MG/2ML SOAJ Inject 300 mg into the skin every 14 (fourteen) days.  Marland Kitchen FARXIGA 10 MG TABS tablet TAKE 1 TABLET DAILY  . fenofibrate (TRICOR) 145 MG tablet TAKE 1 TABLET DAILY  . ferrous sulfate 325 (65 FE) MG EC tablet Take 1 tablet (325 mg total) by mouth 2 (two) times daily with breakfast and lunch.  Marland Kitchen glipiZIDE (GLUCOTROL XL) 10 MG 24 hr tablet TAKE 1 TABLET DAILY WITH BREAKFAST  . metFORMIN (GLUCOPHAGE) 1000 MG tablet TAKE 1 TABLET TWICE A DAY WITH MEALS  . metoprolol tartrate (LOPRESSOR) 25 MG tablet TAKE 1 TABLET TWICE A DAY  . omeprazole (PRILOSEC) 40 MG capsule TAKE 1 CAPSULE DAILY  . rosuvastatin (CRESTOR) 40 MG tablet Take 1 tablet (40 mg total) by mouth daily.  . tamsulosin (FLOMAX) 0.4 MG CAPS capsule TAKE 1 CAPSULE  DAILY  . venlafaxine XR (EFFEXOR-XR) 150 MG 24 hr capsule TAKE 1 CAPSULE DAILY WITH BREAKFAST  . [DISCONTINUED] clonazePAM (KLONOPIN) 0.5 MG tablet Take 1 tablet (0.5 mg total) by mouth daily as needed for anxiety.   No facility-administered medications prior to visit.    {Insert previous labs (optional):23779} {See past labs  Heme  Chem  Endocrine  Serology  Results Review (optional):1}   Objective    BP (!) 148/65 (BP Location: Right Arm, Patient Position: Sitting, Cuff Size: Normal)   Pulse (!) 58   Resp 16   Ht 5' 10.5" (1.791 m)   Wt 155 lb  (70.3 kg)   SpO2 100%   BMI 21.93 kg/m  {Insert last BP/Wt (optional):23777}{See vitals history (optional):1}   Physical Exam Vitals and nursing note reviewed.  Constitutional:      General: He is not in acute distress.    Appearance: Normal appearance.  HENT:     Head: Normocephalic and atraumatic.  Eyes:     General: No scleral icterus.    Conjunctiva/sclera: Conjunctivae normal.  Cardiovascular:     Rate and Rhythm: Normal rate.  Pulmonary:     Effort: Pulmonary effort is normal.  Musculoskeletal:     Comments: PIP and MCP swelling bilaterally  Neurological:     Mental Status: He is alert and oriented to person, place, and time. Mental status is at baseline.  Psychiatric:        Mood and Affect: Mood normal.        Behavior: Behavior normal.     Results for orders placed or performed in visit on 12/01/23  ANA 12Plus Profile, Do All RDL  Result Value Ref Range   Anti-Nuclear Ab by IFA (RDL) Positive (A) Negative   Anti-Centromere Ab (RDL) <1:40 <1:40   Anti-dsDNA Ab by Farr(RDL) <8.0 <8.0 IU/mL   Anti-Sm Ab (RDL) <20 <20 Units   Anti-U1 RNP Ab (RDL) <20 <20 Units   Anti-Ro (SS-A) Ab (RDL) <20 <20 Units   Anti-La (SS-B) Ab (RDL) <20 <20 Units   Anti-Scl-70 Ab (RDL) <20 <20 Units   Anti-Cardiolipin Ab, IgG (RDL) <15 <15 GPL U/mL   Anti-Cardiolipin Ab, IgA (RDL) <12 <12 APL U/mL   Anti-Cardiolipin Ab, IgM (RDL) <13 <13 MPL U/mL   C3 Complement (RDL) 174 90 - 180 mg/dL   C4 Complement (RDL) 25 10 - 40 mg/dL   Anti-TPO Ab (RDL) <1.6 <9.0 IU/mL   Anti-Chromatin Ab, IgG (RDL) <20 <20 Units   Anti-CCP Ab, IgG & IgA (RDL) <20 <20 Units   Rheumatoid Factor by Turb RDL CANCELED IU/mL  Rheumatoid Factor  Result Value Ref Range   Rheumatoid fact SerPl-aCnc <10.0 <14.0 IU/mL  Sed Rate (ESR)  Result Value Ref Range   Sed Rate 2 0 - 30 mm/hr  Vitamin B12  Result Value Ref Range   Vitamin B-12 469 232 - 1,245 pg/mL  Hemoglobin A1c  Result Value Ref Range   Hgb A1c MFr  Bld 8.2 (H) 4.8 - 5.6 %   Est. average glucose Bld gHb Est-mCnc 189 mg/dL  Lipid panel  Result Value Ref Range   Cholesterol, Total 164 100 - 199 mg/dL   Triglycerides 109 0 - 149 mg/dL   HDL 43 >60 mg/dL   VLDL Cholesterol Cal 19 5 - 40 mg/dL   LDL Chol Calc (NIH) 454 (H) 0 - 99 mg/dL   Chol/HDL Ratio 3.8 0.0 - 5.0 ratio  ANA Titer and Pattern  Result Value Ref Range  Speckled Pattern 1:160 (H) <1:40   Note: Comment     Assessment & Plan    Multiple joint pain -     ANA 12Plus Profile, Do All RDL -     Rheumatoid factor -     Sedimentation rate -     methylPREDNISolone; Take 3 tablets (24 mg total) by mouth daily.  Dispense: 12 tablet; Refill: 0  Hypertension associated with diabetes (HCC) -     Vitamin B12 -     Hemoglobin A1c  Hyperlipidemia associated with type 2 diabetes mellitus (HCC) -     Lipid panel  Depression with anxiety -     clonazePAM; Take 1 tablet (0.5 mg total) by mouth daily as needed for anxiety.  Dispense: 30 tablet; Refill: 2  Chronically on benzodiazepine therapy -     clonazePAM; Take 1 tablet (0.5 mg total) by mouth daily as needed for anxiety.  Dispense: 30 tablet; Refill: 2  High risk medication use -     Vitamin B12  Need for shingles vaccine -     Varicella-zoster vaccine IM  Encounter for colorectal cancer screening -     Ambulatory referral to Gastroenterology  Other orders -     ANA Titer and Pattern   Multiple joint pain Gradual onset of hand pain with a dull, thudding sensation and nocturnal tingling, exacerbated by work activities. Swelling in the proximal interphalangeal and metacarpophalangeal joints. Differential diagnosis includes rheumatoid arthritis and osteoarthritis. Previous steroid treatment provided relief.  Patient opted for a steroid burst over a Dosepak to avoid tapering. - Order blood tests for rheumatoid arthritis - Prescribe a three-day steroid burst for pain management - Advise acetaminophen for pain  management  Type 2 Diabetes Mellitus Follow-up for diabetes management. A1c levels to be re-evaluated. He does not regularly monitor blood glucose. - Order A1c test - Encourage regular blood glucose monitoring  Hyperlipidemia On rosuvastatin and fenofibrate for cholesterol management. Previous elevated cholesterol levels, possibly due to dietary habits. No current cardiology follow-up. Discussed potential cardiology referral for Repatha if cholesterol remains elevated, considering his bypass surgery. - Order cholesterol panel - Consider cardiology referral for potential Repatha treatment if cholesterol remains elevated  Eczema Chronic eczema managed with Dupixent. No acute issues reported.  General Health Maintenance Due for dilated eye exam and colonoscopy. Encouraged shingles vaccine due to chickenpox history. Discussed shingles risks, including nerve pain and complications affecting vision, hearing, and swallowing. - Schedule dilated eye exam - Refer for colonoscopy - Administer shingles vaccine - Advise COVID-19 booster available at pharmacy ***  Return in about 3 months (around 03/02/2024) for DM, HTN.      I discussed the assessment and treatment plan with the patient  The patient was provided an opportunity to ask questions and all were answered. The patient agreed with the plan and demonstrated an understanding of the instructions.   The patient was advised to call back or seek an in-person evaluation if the symptoms worsen or if the condition fails to improve as anticipated.    Sherlyn Hay, DO  [facility] [phone] (phone) [phone] (fax)  [facility]

## 2023-12-01 NOTE — Patient Instructions (Addendum)
 Recommended vaccines: covid booster  Voltaren

## 2023-12-08 LAB — ANA 12PLUS PROFILE, DO ALL RDL
Anti-CCP Ab, IgG & IgA (RDL): <20 U (ref <20)
Anti-Cardiolipin Ab, IgA (RDL): <12 U/mL (ref <12)
Anti-Cardiolipin Ab, IgG (RDL): <15 GPL U/mL (ref <15)
Anti-Cardiolipin Ab, IgM (RDL): <13 [MPL'U]/mL (ref <13)
Anti-La (SS-B) Ab (RDL): <20 U (ref <20)
Anti-Ro (SS-A) Ab (RDL): <20 U (ref <20)
Anti-Scl-70 Ab (RDL): <20 U (ref <20)
Anti-Sm Ab (RDL): <20 U (ref <20)
Anti-U1 RNP Ab (RDL): <20 U (ref <20)
C3 Complement (RDL): 174 mg/dL (ref 90–180)
C4 Complement (RDL): 25 mg/dL (ref 10–40)

## 2023-12-08 LAB — HEMOGLOBIN A1C
Est. average glucose Bld gHb Est-mCnc: 189 mg/dL
Hgb A1c MFr Bld: 8.2 % — ABNORMAL HIGH (ref 4.8–5.6)

## 2023-12-08 LAB — SEDIMENTATION RATE: Sed Rate: 2 mm/h (ref 0–30)

## 2023-12-08 LAB — LIPID PANEL
Chol/HDL Ratio: 3.8 ratio (ref 0.0–5.0)
Cholesterol, Total: 164 mg/dL (ref 100–199)
HDL: 43 mg/dL (ref >39)
LDL Chol Calc (NIH): 102 mg/dL — ABNORMAL HIGH (ref 0–99)
Triglycerides: 101 mg/dL (ref 0–149)
VLDL Cholesterol Cal: 19 mg/dL (ref 5–40)

## 2023-12-08 LAB — VITAMIN B12: Vitamin B-12: 469 pg/mL (ref 232–1245)

## 2023-12-08 LAB — RHEUMATOID FACTOR: Rheumatoid fact SerPl-aCnc: <10 [IU]/mL (ref <14.0)

## 2023-12-08 LAB — ANA TITER AND PATTERN: Speckled Pattern: 1:160 {titer} — ABNORMAL HIGH

## 2023-12-11 ENCOUNTER — Encounter: Payer: Self-pay | Admitting: Family Medicine

## 2023-12-16 ENCOUNTER — Encounter: Payer: Self-pay | Admitting: *Deleted

## 2023-12-19 ENCOUNTER — Encounter: Payer: Self-pay | Admitting: Family Medicine

## 2023-12-25 ENCOUNTER — Other Ambulatory Visit: Payer: Self-pay | Admitting: Family Medicine

## 2023-12-25 DIAGNOSIS — M2550 Pain in unspecified joint: Secondary | ICD-10-CM

## 2023-12-25 DIAGNOSIS — R768 Other specified abnormal immunological findings in serum: Secondary | ICD-10-CM

## 2023-12-25 DIAGNOSIS — E1169 Type 2 diabetes mellitus with other specified complication: Secondary | ICD-10-CM

## 2023-12-25 MED ORDER — MELOXICAM 7.5 MG PO TABS: 7.5000 mg | ORAL_TABLET | Freq: Every day | ORAL | 0 refills | Status: DC

## 2023-12-25 MED ORDER — METFORMIN HCL ER 500 MG PO TB24: 1000.0000 mg | ORAL_TABLET | Freq: Two times a day (BID) | ORAL | 3 refills | Status: DC

## 2023-12-25 MED ORDER — EZETIMIBE 10 MG PO TABS: 10.0000 mg | ORAL_TABLET | Freq: Every day | ORAL | 3 refills | Status: DC

## 2024-01-28 DIAGNOSIS — E1169 Type 2 diabetes mellitus with other specified complication: Secondary | ICD-10-CM | POA: Diagnosis not present

## 2024-01-28 DIAGNOSIS — M2550 Pain in unspecified joint: Secondary | ICD-10-CM | POA: Diagnosis not present

## 2024-01-28 LAB — HEMOGLOBIN A1C: Hemoglobin A1C: 9.4

## 2024-03-02 ENCOUNTER — Encounter: Payer: Self-pay | Admitting: Family Medicine

## 2024-03-02 ENCOUNTER — Ambulatory Visit (INDEPENDENT_AMBULATORY_CARE_PROVIDER_SITE_OTHER): Admitting: Family Medicine

## 2024-03-02 VITALS — BP 136/62 | HR 56 | Ht 70.0 in | Wt 158.8 lb

## 2024-03-02 DIAGNOSIS — E785 Hyperlipidemia, unspecified: Secondary | ICD-10-CM

## 2024-03-02 DIAGNOSIS — E1169 Type 2 diabetes mellitus with other specified complication: Secondary | ICD-10-CM

## 2024-03-02 DIAGNOSIS — I152 Hypertension secondary to endocrine disorders: Secondary | ICD-10-CM

## 2024-03-02 DIAGNOSIS — I25709 Atherosclerosis of coronary artery bypass graft(s), unspecified, with unspecified angina pectoris: Secondary | ICD-10-CM | POA: Diagnosis not present

## 2024-03-02 DIAGNOSIS — Z7984 Long term (current) use of oral hypoglycemic drugs: Secondary | ICD-10-CM

## 2024-03-02 DIAGNOSIS — E1159 Type 2 diabetes mellitus with other circulatory complications: Secondary | ICD-10-CM

## 2024-03-02 DIAGNOSIS — F418 Other specified anxiety disorders: Secondary | ICD-10-CM

## 2024-03-02 DIAGNOSIS — E559 Vitamin D deficiency, unspecified: Secondary | ICD-10-CM

## 2024-03-02 DIAGNOSIS — D473 Essential (hemorrhagic) thrombocythemia: Secondary | ICD-10-CM

## 2024-03-02 MED ORDER — VITAMIN D (ERGOCALCIFEROL) 1.25 MG (50000 UNIT) PO CAPS: 50000.0000 [IU] | ORAL_CAPSULE | ORAL | 1 refills | Status: DC

## 2024-03-02 NOTE — Progress Notes (Signed)
 Established patient visit   Patient: Joseph Terry   DOB: [birth year]   63 y.o. Male  MRN: [id number] Visit Date: 03/02/2024  Today's healthcare provider: LAURAINE LOISE BUOY, DO   Chief Complaint  Patient presents with   Hypertension   Diabetes    Saw eye doctor in January but did not do diabetic test   Subjective    HPI Joseph Terry is a 63 year old male with diabetes who presents for a general follow-up visit.  He is not monitoring his blood glucose levels at home and denies symptoms of hypoglycemia, such as shakiness or visual disturbances. His current diabetes medications include glipizide , Farxiga , and metformin . His most recent hemoglobin A1c was 9.4% on 01/28/2024.  He experiences easy bruising without significant trauma, which he attributes to his history of thrombocytopenia. He is not on anticoagulants and takes over-the-counter iron supplements.  He reports low energy levels, attributing them to aging. He has not been taking vitamin D  since December, despite previously low levels.  He is currently on venlafaxine  but feels his mood is suboptimal, possibly due to stress related to retirement and adverse work conditions, which he describes as excessively hot.  He had a recent rheumatology consultation where x-rays were performed. He received an email indicating no specific condition was identified.  He has not yet undergone a colonoscopy, prioritizing arthritis management. He has received correspondence from [facility] and has his letter at home.  He had an eye exam earlier this year without dilation and plans to complete this in the coming weeks.       Medications: Outpatient Medications Prior to Visit  Medication Sig   clobetasol  cream (TEMOVATE ) 0.05 % Apply 1 Application topically as directed. qd to bid aa eczema on arms until clear, then prn flares, avoid face, groin, axilla   clonazePAM  (KLONOPIN ) 0.5 MG tablet Take 1 tablet (0.5 mg total) by  mouth daily as needed for anxiety.   diclofenac  Sodium (VOLTAREN ) 1 % GEL Apply 2 g topically 4 (four) times daily.   Dupilumab  (DUPIXENT ) 300 MG/2ML SOAJ Inject 300 mg into the skin every 14 (fourteen) days.   FARXIGA  10 MG TABS tablet TAKE 1 TABLET DAILY   fenofibrate  (TRICOR ) 145 MG tablet TAKE 1 TABLET DAILY   ferrous sulfate  325 (65 FE) MG EC tablet Take 1 tablet (325 mg total) by mouth 2 (two) times daily with breakfast and lunch.   glipiZIDE  (GLUCOTROL  XL) 10 MG 24 hr tablet TAKE 1 TABLET DAILY WITH BREAKFAST   meloxicam  (MOBIC ) 7.5 MG tablet Take 1 tablet (7.5 mg total) by mouth daily.   metFORMIN  (GLUCOPHAGE -XR) 500 MG 24 hr tablet Take 2 tablets (1,000 mg total) by mouth 2 (two) times daily with a meal.   methylPREDNISolone  (MEDROL ) 8 MG tablet Take 3 tablets (24 mg total) by mouth daily.   metoprolol  tartrate (LOPRESSOR ) 25 MG tablet TAKE 1 TABLET TWICE A DAY   omeprazole  (PRILOSEC) 40 MG capsule TAKE 1 CAPSULE DAILY   rosuvastatin  (CRESTOR ) 40 MG tablet Take 1 tablet (40 mg total) by mouth daily.   tamsulosin  (FLOMAX ) 0.4 MG CAPS capsule TAKE 1 CAPSULE DAILY   venlafaxine  XR (EFFEXOR -XR) 150 MG 24 hr capsule TAKE 1 CAPSULE DAILY WITH BREAKFAST   ezetimibe  (ZETIA ) 10 MG tablet Take 1 tablet (10 mg total) by mouth daily. (Patient not taking: Reported on 03/02/2024)   No facility-administered medications prior to visit.        Objective  BP 136/62 (BP Location: Left Arm, Patient Position: Sitting, Cuff Size: Normal)   Pulse (!) 56   Ht 5' 10 (1.778 m)   Wt 158 lb 12.8 oz (72 kg)   SpO2 98%   BMI 22.79 kg/m     Physical Exam Vitals and nursing note reviewed.  Constitutional:      General: He is not in acute distress.    Appearance: Normal appearance.  HENT:     Head: Normocephalic and atraumatic.   Eyes:     General: No scleral icterus.    Conjunctiva/sclera: Conjunctivae normal.    Cardiovascular:     Rate and Rhythm: Normal rate.  Pulmonary:     Effort:  Pulmonary effort is normal.   Neurological:     Mental Status: He is alert and oriented to person, place, and time. Mental status is at baseline.   Psychiatric:        Mood and Affect: Mood normal.        Behavior: Behavior normal.      No results found for any visits on 03/02/24.  Assessment & Plan    Type 2 diabetes mellitus with other specified complication, without long-term current use of insulin  (HCC)  Hypertension associated with diabetes (HCC)  Hyperlipidemia associated with type 2 diabetes mellitus (HCC)  Vitamin D  deficiency -     Vitamin D  (Ergocalciferol ); Take 1 capsule (50,000 Units total) by mouth every 7 (seven) days.  Dispense: 12 capsule; Refill: 1  Coronary artery disease involving coronary bypass graft of native heart with angina pectoris (HCC)  Depression with anxiety  Essential (hemorrhagic) thrombocythemia (HCC)     Diabetes Mellitus Type 2 A1c at 9.4 indicates poor control. On glipizide , Farxiga , and metformin . Rheumatology suggested symptoms might be diabetes-related, awaiting treatment plan. Not monitoring blood glucose at home. - Contact rheumatology for treatment plan. - Continue glipizide , Farxiga , and metformin . - Encourage home blood glucose monitoring.  Hypertension Chronic, stable.  Continue metoprolol  25 mg twice daily.  Hyperlipidemia Not taking ezetimibe  due to medication management oversight.  Continue rosuvastatin  and fenofibrate . - Review and manage medication adherence with ezetimibe .  Depression with anxiety On venlafaxine , reports feeling 'not good', possibly due to stress from retirement and work conditions. - Continue venlafaxine  XR 150 mg. - Monitor mood and consider adjustment if symptoms persist.  Vitamin D  Deficiency Low vitamin D  levels, not taking supplements since December. - Prescribe vitamin D  supplementation, one pill per week.  Essential thrombocytopenia No acute concerns.  Continue daily iron and continue  to monitor.  General Health Maintenance Declined pneumonia and shingles vaccines due to adverse reactions. Considering lung cancer screening but prefers no treatment if positive. Delayed colonoscopy for arthritis treatment. Plans eye exam soon. - Discuss lung cancer screening preferences and document decision. - Encourage completion of colonoscopy. - Encourage completion of eye exam.    Follow-up Transitioning to Medicare soon, plans 'Welcome to [facility]' visit post-transition. - Schedule follow-up visit in three months. - Plan '[facility] to [facility]' visit after Medicare activation.  Return in about 3 months (around 06/02/2024) for Chronic f/u.      I discussed the assessment and treatment plan with the patient  The patient was provided an opportunity to ask questions and all were answered. The patient agreed with the plan and demonstrated an understanding of the instructions.   The patient was advised to call back or seek an in-person evaluation if the symptoms worsen or if the condition fails to improve as anticipated.  LAURAINE LOISE BUOY, DO  [facility] [phone] (phone) [phone] (fax)  [facility]

## 2024-03-02 NOTE — Patient Instructions (Signed)
Please call to schedule your colonoscopy

## 2024-03-28 ENCOUNTER — Encounter: Payer: Self-pay | Admitting: Dermatology

## 2024-03-28 ENCOUNTER — Ambulatory Visit (INDEPENDENT_AMBULATORY_CARE_PROVIDER_SITE_OTHER): Payer: BC Managed Care – PPO | Admitting: Dermatology

## 2024-03-28 DIAGNOSIS — L209 Atopic dermatitis, unspecified: Secondary | ICD-10-CM

## 2024-03-28 DIAGNOSIS — Z79899 Other long term (current) drug therapy: Secondary | ICD-10-CM | POA: Diagnosis not present

## 2024-03-28 DIAGNOSIS — Z7189 Other specified counseling: Secondary | ICD-10-CM

## 2024-03-28 DIAGNOSIS — L2081 Atopic neurodermatitis: Secondary | ICD-10-CM

## 2024-03-28 NOTE — Progress Notes (Signed)
   Follow-Up Visit   Subjective  Joseph Terry is a 63 y.o. male who presents for the following: Atopic Dermatitis. Head neck, arms, 6 month follow up. On Dupixent  300 mg/2 ml every 2 weeks since 04/22/2023. Will be changing to [facility] and [facility] in September. States he is no longer itching. No longer having flares of dermatitis. Denies side effects or adverse reactions from Dupixent .   Patient received bill from [facility].   The following portions of the chart were reviewed this encounter and updated as appropriate: medications, allergies, medical history  Review of Systems:  No other skin or systemic complaints except as noted in HPI or Assessment and Plan.  Objective  Well appearing patient in no apparent distress; mood and affect are within normal limits.  Areas Examined: Face, neck, arms   Relevant physical exam findings are noted in the Assessment and Plan.    Assessment & Plan   ATOPIC DERMATITIS, UNSPECIFIED TYPE   LONG-TERM USE OF HIGH-RISK MEDICATION   ATOPIC NEURODERMATITIS   COUNSELING AND COORDINATION OF CARE   MEDICATION MANAGEMENT     ATOPIC DERMATITIS Exam: mild xerosis on arms 0% BSA  Chronic condition with duration or expected duration over one year. Currently well-controlled.   Atopic dermatitis - Severe, on Dupixent  (biologic medication).  Atopic dermatitis (eczema) is a chronic, relapsing, pruritic condition that can significantly affect quality of life. It is often associated with allergic rhinitis and/or asthma and can require treatment with topical medications, phototherapy, or in severe cases biologic medications, which require long term medication management.    Treatment Plan: Continue Dupixent  300 mg/2mL SQ QOW. Patient denies side effects. Will refill when receive refill request from pharmacy.   If wanting to taper in the future, would: inject every 3 weeks or every 4 weeks to see if maintains control.   Dupixent  [employer] contact information  given to patient. He will call to make sure there is a copay assistance card on file.   Potential side effects include allergic reaction, herpes infections, injection site reactions and conjunctivitis (inflammation of the eyes).  The use of Dupixent  requires long term medication management, including periodic office visits.    Long term medication management.  Patient is using long term (months to years) prescription medication  to control their dermatologic condition.  These medications require periodic monitoring to evaluate for efficacy and side effects and may require periodic laboratory monitoring.   Recommend gentle skin care.   Return in about 1 year (around 03/28/2025) for Atopic Dermatitis Follow Up, Dupixent  Follow Up.  I, Kate Fought, CMA, am acting as scribe for Boneta Sharps, MD.   Documentation: I have reviewed the above documentation for accuracy and completeness, and I agree with the above.  Boneta Sharps, MD

## 2024-03-28 NOTE — Patient Instructions (Addendum)
 Continue Dupixent injections every 2 weeks.   Dupilumab (Dupixent) is a treatment given by injection for adults and children with moderate-to-severe atopic dermatitis. Goal is control of skin condition, not cure. It is given as 2 injections at the first dose followed by 1 injection ever 2 weeks thereafter.  Young children are dosed monthly.  Potential side effects include allergic reaction, herpes infections, injection site reactions and conjunctivitis (inflammation of the eyes).  The use of Dupixent requires long term medication management, including periodic office visits.     Gentle Skin Care Guide  1. Bathe no more than once a day.  2. Avoid bathing in hot water  3. Use a mild soap like Dove, Vanicream, Cetaphil, CeraVe. Can use Lever 2000 or Cetaphil antibacterial soap  4. Use soap only where you need it. On most days, use it under your arms, between your legs, and on your feet. Let the water rinse other areas unless visibly dirty.  5. When you get out of the bath/shower, use a towel to gently blot your skin dry, don't rub it.  6. While your skin is still a little damp, apply a moisturizing cream such as Vanicream, CeraVe, Cetaphil, Eucerin, Sarna lotion or plain Vaseline Jelly. For hands apply Neutrogena [place] Hand Cream or Excipial Hand Cream.  7. Reapply moisturizer any time you start to itch or feel dry.  8. Sometimes using free and clear laundry detergents can be helpful. Fabric softener sheets should be avoided. Downy Free & Gentle liquid, or any liquid fabric softener that is free of dyes and perfumes, it acceptable to use  9. If your doctor has given you prescription creams you may apply moisturizers over them       Due to recent changes in healthcare laws, you may see results of your pathology and/or laboratory studies on MyChart before the doctors have had a chance to review them. We understand that in some cases there may be results that are confusing or concerning  to you. Please understand that not all results are received at the same time and often the doctors may need to interpret multiple results in order to provide you with the best plan of care or course of treatment. Therefore, we ask that you please give [place] 2 business days to thoroughly review all your results before contacting the office for clarification. Should we see a critical lab result, you will be contacted sooner.   If You Need Anything After Your Visit  If you have any questions or concerns for your doctor, please call our main line at [phone] and press option 4 to reach your doctor's medical assistant. If no one answers, please leave a voicemail as directed and we will return your call as soon as possible. Messages left after 4 pm will be answered the following business day.   You may also send [place] a message via MyChart. We typically respond to MyChart messages within 1-2 business days.  For prescription refills, please ask your pharmacy to contact our office. Our fax number is [phone].  If you have an urgent issue when the clinic is closed that cannot wait until the next business day, you can page your doctor at the number below.    Please note that while we do our best to be available for urgent issues outside of office hours, we are not available 24/7.   If you have an urgent issue and are unable to reach [place], you may choose to seek medical care at your  doctor's office, retail clinic, urgent care center, or emergency room.  If you have a medical emergency, please immediately call 911 or go to the emergency department.  Pager Numbers  - Dr. Gwen Pounds: [phone]  - Dr. Roseanne Reno: [phone]  - Dr. Katrinka Blazing: [phone]   In the event of inclement weather, please call our main line at [phone] for an update on the status of any delays or closures.  Dermatology Medication Tips: Please keep the boxes that topical medications come in in order to help keep track of the  instructions about where and how to use these. Pharmacies typically print the medication instructions only on the boxes and not directly on the medication tubes.   If your medication is too expensive, please contact our office at [phone] option 4 or send [place] a message through MyChart.   We are unable to tell what your co-pay for medications will be in advance as this is different depending on your insurance coverage. However, we may be able to find a substitute medication at lower cost or fill out paperwork to get insurance to cover a needed medication.   If a prior authorization is required to get your medication covered by your insurance company, please allow [place] 1-2 business days to complete this process.  Drug prices often vary depending on where the prescription is filled and some pharmacies may offer cheaper prices.  The website www.goodrx.com contains coupons for medications through different pharmacies. The prices here do not account for what the cost may be with help from insurance (it may be cheaper with your insurance), but the website can give you the price if you did not use any insurance.  - You can print the associated coupon and take it with your prescription to the pharmacy.  - You may also stop by our office during regular business hours and pick up a GoodRx coupon card.  - If you need your prescription sent electronically to a different pharmacy, notify our office through [facility] or by phone at [phone] option 4.     Si Usted Necesita Algo Despus de Su Visita  Tambin puede enviarnos un mensaje a travs de Clinical cytogeneticist. Por lo general respondemos a los mensajes de MyChart en el transcurso de 1 a 2 das hbiles.  Para renovar recetas, por favor pida a su farmacia que se ponga en contacto con nuestra oficina. Annie Sable de fax es [place] [phone].  Si tiene un asunto urgente cuando la clnica est cerrada y que no puede esperar hasta el siguiente da hbil,  puede llamar/localizar a su doctor(a) al nmero que aparece a continuacin.   Por favor, tenga en cuenta que aunque hacemos todo lo posible para estar disponibles para asuntos urgentes fuera del horario de [place], no estamos disponibles las 24 horas del da, los 7 [address]  [address] de la [place].   Si tiene un problema urgente y no puede comunicarse con nosotros, puede optar por buscar atencin mdica  en el consultorio de su doctor(a), en una clnica privada, en un centro de atencin urgente o en una sala de emergencias.  Si tiene Engineer, drilling, por favor llame inmediatamente al 911 o vaya a la sala de emergencias.  Nmeros de bper  - Dr. Gwen Pounds: [phone]  - Dra. Roseanne Reno: [phone]  - Dr. Katrinka Blazing: [phone]   En caso de inclemencias del tiempo, por favor llame a Lacy Duverney principal al [phone] para una actualizacin sobre el [place] de cualquier retraso o cierre.  Consejos para la medicacin en  dermatologa: Por favor, guarde las cajas en las que vienen los medicamentos de uso tpico para ayudarle a seguir las instrucciones sobre dnde y cmo usarlos. Las farmacias generalmente imprimen las instrucciones del medicamento slo en las cajas y no directamente en los tubos del [place].   Si su medicamento es muy caro, por favor, pngase en contacto con Rolm Gala llamando al [phone] y presione la opcin 4 o envenos un mensaje a travs de Clinical cytogeneticist.   No podemos decirle cul ser su copago por los medicamentos por adelantado ya que esto es diferente dependiendo de la cobertura de su seguro. Sin embargo, es posible que podamos encontrar un medicamento sustituto a Audiological scientist un formulario para que el seguro cubra el medicamento que se considera necesario.   Si se requiere una autorizacin previa para que su compaa de seguros [place] su medicamento, por favor permtanos de 1 a 2 das hbiles para completar [address]  Los precios de los medicamentos varan  con frecuencia dependiendo del Environmental consultant de dnde se surte la receta y alguna farmacias pueden ofrecer precios ms baratos.  El sitio web www.goodrx.com tiene cupones para medicamentos de Health and safety inspector. Los precios aqu no tienen en cuenta lo que podra costar con la ayuda del seguro (puede ser ms barato con su seguro), pero el sitio web puede darle el precio si no utiliz Tourist information centre manager.  - Puede imprimir el cupn correspondiente y llevarlo con su receta a la farmacia.  - Tambin puede pasar por nuestra oficina durante el horario de atencin regular y Education officer, museum una tarjeta de cupones de GoodRx.  - Si necesita que su receta se enve electrnicamente a una farmacia diferente, informe a nuestra oficina a travs de MyChart de [place] o por telfono llamando al [phone] y presione la opcin 4.

## 2024-04-29 ENCOUNTER — Telehealth: Payer: Self-pay

## 2024-04-29 DIAGNOSIS — E1169 Type 2 diabetes mellitus with other specified complication: Secondary | ICD-10-CM

## 2024-05-02 NOTE — Progress Notes (Unsigned)
 Brief Telephone Documentation Reason for Call: Medication management  Summary of Call: Attempted to call patient regarding medication management. No answer, left voicemail with direct callback number.   Mckinnley Cottier E. Marsh, PharmD Clinical Pharmacist [facility] [phone]

## 2024-05-06 MED ORDER — LANCETS MISC. MISC
5 refills | Status: DC
Start: 2024-05-06 — End: 2024-05-18

## 2024-05-06 MED ORDER — BLOOD GLUCOSE MONITORING SUPPL DEVI: 0 refills | Status: DC

## 2024-05-06 MED ORDER — LANCET DEVICE MISC: 0 refills | Status: DC

## 2024-05-06 MED ORDER — BLOOD GLUCOSE TEST VI STRP: ORAL_STRIP | 5 refills | Status: DC

## 2024-05-06 NOTE — Addendum Note (Signed)
 Addended by: MARSH, Kendry Pfarr E on: 05/06/2024 11:41 AM   Modules accepted: Orders

## 2024-05-06 NOTE — Progress Notes (Signed)
 Brief Telephone Documentation Reason for Call: Medication management  Summary of Call: Called patient to discuss scheduling appointment with clinical pharmacist for DM management. Patient reports he has not been checking BG at home as he does not have testing supplies. He is currently awaiting Medicare coverage starting on 05/16/24.   Follow Up: Prescription sent to [facility] pharmacy for BG testing supplies. Patient agreeable to bring to upcoming pharmacy appointment for education.   Syan Cullimore E. Marsh, PharmD Clinical Pharmacist [facility] [phone]

## 2024-05-13 ENCOUNTER — Other Ambulatory Visit: Payer: Self-pay

## 2024-05-13 ENCOUNTER — Telehealth: Payer: Self-pay | Admitting: Family Medicine

## 2024-05-13 DIAGNOSIS — E1159 Type 2 diabetes mellitus with other circulatory complications: Secondary | ICD-10-CM

## 2024-05-13 DIAGNOSIS — K2270 Barrett's esophagus without dysplasia: Secondary | ICD-10-CM

## 2024-05-13 MED ORDER — DAPAGLIFLOZIN PROPANEDIOL 10 MG PO TABS: 10.0000 mg | ORAL_TABLET | Freq: Every day | ORAL | 3 refills | Status: DC

## 2024-05-13 MED ORDER — OMEPRAZOLE 40 MG PO CPDR: DELAYED_RELEASE_CAPSULE | ORAL | 3 refills | Status: DC

## 2024-05-13 NOTE — Telephone Encounter (Signed)
 [facility] is asking for refills on Farxiga  10 mg. #90 with 3 Rf

## 2024-05-13 NOTE — Telephone Encounter (Signed)
 Converted into refill request and sent prescriptions in.

## 2024-05-13 NOTE — Telephone Encounter (Signed)
 [facility] is asking for refills on Omeprazole  DR 40 mg. #90 with 3 RF

## 2024-05-18 ENCOUNTER — Other Ambulatory Visit: Payer: Self-pay

## 2024-05-18 ENCOUNTER — Encounter: Payer: Self-pay | Admitting: Oncology

## 2024-05-18 ENCOUNTER — Ambulatory Visit (INDEPENDENT_AMBULATORY_CARE_PROVIDER_SITE_OTHER): Payer: Self-pay

## 2024-05-18 ENCOUNTER — Other Ambulatory Visit: Payer: Self-pay | Admitting: Dermatology

## 2024-05-18 ENCOUNTER — Encounter: Payer: Self-pay | Admitting: Dermatology

## 2024-05-18 DIAGNOSIS — D500 Iron deficiency anemia secondary to blood loss (chronic): Secondary | ICD-10-CM

## 2024-05-18 DIAGNOSIS — E559 Vitamin D deficiency, unspecified: Secondary | ICD-10-CM

## 2024-05-18 DIAGNOSIS — E1169 Type 2 diabetes mellitus with other specified complication: Secondary | ICD-10-CM

## 2024-05-18 DIAGNOSIS — Z79899 Other long term (current) drug therapy: Secondary | ICD-10-CM

## 2024-05-18 DIAGNOSIS — Z7984 Long term (current) use of oral hypoglycemic drugs: Secondary | ICD-10-CM

## 2024-05-18 DIAGNOSIS — F418 Other specified anxiety disorders: Secondary | ICD-10-CM

## 2024-05-18 DIAGNOSIS — E1159 Type 2 diabetes mellitus with other circulatory complications: Secondary | ICD-10-CM

## 2024-05-18 LAB — POCT GLYCOSYLATED HEMOGLOBIN (HGB A1C): Hemoglobin A1C: 9.8 % — AB (ref 4.0–5.6)

## 2024-05-18 MED ORDER — ONETOUCH ULTRASOFT LANCETS MISC
5 refills | Status: AC
  Filled 2024-05-18: qty 100, 20d supply, fill #0
  Filled 2024-08-25 (×2): qty 100, 20d supply, fill #1
  Filled 2024-09-10 (×2): qty 100, 20d supply, fill #2

## 2024-05-18 MED ORDER — BLOOD GLUCOSE MONITOR SYSTEM W/DEVICE KIT
PACK | 0 refills | Status: AC
  Filled 2024-05-18: qty 1, 30d supply, fill #0

## 2024-05-18 MED ORDER — OZEMPIC (0.25 OR 0.5 MG/DOSE) 2 MG/3ML ~~LOC~~ SOPN
0.2500 mg | PEN_INJECTOR | SUBCUTANEOUS | 1 refills | Status: DC
  Filled 2024-05-18: qty 3, 28d supply, fill #0

## 2024-05-18 MED ORDER — BLOOD GLUCOSE TEST VI STRP
ORAL_STRIP | 5 refills | Status: AC
  Filled 2024-05-18: qty 100, 20d supply, fill #0
  Filled 2024-05-30 – 2024-08-25 (×3): qty 100, 20d supply, fill #1
  Filled 2024-09-10: qty 100, 20d supply, fill #2

## 2024-05-18 MED ORDER — LANCET DEVICE MISC
0 refills | Status: AC
  Filled 2024-05-18: qty 1, fill #0
  Filled 2024-08-25: qty 1, 30d supply, fill #0

## 2024-05-18 MED ORDER — DAPAGLIFLOZIN PROPANEDIOL 10 MG PO TABS
10.0000 mg | ORAL_TABLET | Freq: Every day | ORAL | 3 refills | Status: AC
  Filled 2024-05-18: qty 30, 30d supply, fill #0
  Filled 2024-07-26: qty 30, 30d supply, fill #1
  Filled 2024-08-25: qty 30, 30d supply, fill #2
  Filled 2024-09-10 – 2024-09-18 (×2): qty 30, 30d supply, fill #3

## 2024-05-18 NOTE — Progress Notes (Signed)
 S:     Chief Complaint  Patient presents with   Diabetes    Reason for visit: ?  Joseph Terry is a 63 y.o. male with a history of diabetes (type 2), who presents today for an initial diabetes pharmacotherapy visit.? Pertinent PMH also includes HTN, CAD s/p CABG x5, atopic dermatitis, depression, and anxiety.  Care Team: Primary Care Provider: Donzella Lauraine SAILOR, DO  At last visit with PCP on 03/02/24, BG was poorly controlled. Rheumatology his polyarthralgia symptoms might be diabetes related.   Today, patient reports he retired a month ago and has been working on getting into a new routine. He endorses eating more snacks and spending majority of the day watching TV since he retired. He has officially enrolled in [place] as of this month.   Current diabetes medications include: Farxiga  10 mg daily, glipizide  XL 10 mg dialy, metformin  XR 500 mg 2 tablets BID Previous diabetes medications include: Januvia  (cost), Ozempic  (cost) Current hypertension medications include: metoprolol  tartrate 25 mg BID Current hyperlipidemia medications include: ezetimibe  10 mg daily, rosuvastatin  40 mg daily, fenofibrate  145 mg daily   Patient reports missing the PM dose of his metformin  and metoprolol .   Have you been experiencing any side effects to the medications prescribed? no Do you have any problems obtaining medications due to transportation or finances? no Insurance coverage: newly enrolled in Medicare  Reported home fasting blood sugars: not checking   Patient reports nocturia (nighttime urination). Patient reports polydipsia.  Patient reports neuropathy (nerve pain). Patient denies visual changes. Patient reports self foot exams.   Patient reported dietary habits: Eats 3 meals/day Breakfast: sausage cheese sandwich Lunch: tv dinner Dinner: tv dinner Snacks: chips Drinks: sugar-free and regular soda (6 of each)  Patient-reported exercise habits: working in the yard DM Prevention:   Statin: Taking; high intensity.?  History of albuminuria? no, last UACR on 09/01/23 = <9 mg/g Last eye exam: Lab Results  Component Value Date   HMDIABEYEEXA No Retinopathy 12/24/2020   HMDIABEYEEXA No Retinopathy 12/24/2020   Last foot exam: No foot exam found Tobacco Use:   Tobacco Use: Medium Risk (03/28/2024)   Patient History    Smoking Tobacco Use: Former    Smokeless Tobacco Use: Never    Passive Exposure: Not on file   O:   Vitals:  Wt Readings from Last 3 Encounters:  03/02/24 158 lb 12.8 oz (72 kg)  12/01/23 155 lb (70.3 kg)  09/01/23 151 lb (68.5 kg)   BP Readings from Last 3 Encounters:  03/02/24 136/62  12/01/23 (!) 148/65  09/01/23 123/65   Pulse Readings from Last 3 Encounters:  03/02/24 (!) 56  12/01/23 (!) 58  09/01/23 (!) 58     Labs:?  Lab Results  Component Value Date   HGBA1C 9.8 (A) 05/18/2024   HGBA1C 9.4 01/28/2024   HGBA1C 8.2 (H) 12/01/2023   GLUCOSE 201 (H) 09/01/2023   MICRALBCREAT <9 09/01/2023   MICRALBCREAT <3 08/26/2022   MICRALBCREAT 5.3 05/15/2014   CREATININE 0.75 (L) 09/01/2023   CREATININE 0.89 08/26/2022   CREATININE 0.68 (L) 08/07/2021    Lab Results  Component Value Date   CHOL 164 12/01/2023   LDLCALC 102 (H) 12/01/2023   LDLCALC 127 (H) 09/01/2023   LDLCALC 80 08/26/2022   HDL 43 12/01/2023   TRIG 101 12/01/2023   TRIG 172 (H) 09/01/2023   TRIG 220 (H) 08/26/2022   ALT 24 09/01/2023   ALT 27 08/26/2022   AST 18 09/01/2023  AST 23 08/26/2022      Chemistry      Component Value Date/Time   NA 139 09/01/2023 1105   K 4.5 09/01/2023 1105   CL 100 09/01/2023 1105   CO2 24 09/01/2023 1105   BUN 15 09/01/2023 1105   CREATININE 0.75 (L) 09/01/2023 1105   CREATININE 0.76 06/18/2015 0821   GLU 89 03/03/2012 0000      Component Value Date/Time   CALCIUM  10.0 09/01/2023 1105   ALKPHOS 86 09/01/2023 1105   AST 18 09/01/2023 1105   ALT 24 09/01/2023 1105   BILITOT 0.3 09/01/2023 1105       The  10-year ASCVD risk score (Arnett DK, et al., 2019) is: 25.9%  Lab Results  Component Value Date   MICRALBCREAT <9 09/01/2023   MICRALBCREAT <3 08/26/2022   MICRALBCREAT 5.3 05/15/2014    A/P: Diabetes currently uncontrolled with a most recent A1c of 9.8% on 05/18/24, which is up from 9.4% on 01/28/24. Patient is not currently monitoring BG at home. Medication adherence appears suboptimal for PM doses of medications. Control is suboptimal due to dietary indiscretions. Patient was historically managed on GLP1 therapy without side effects. Interested in restarting this pending cost. -Restarted GLP-1 Ozempic  (semaglutide ) 0.25 mg weekly -Continued SGLT2-I  Farxiga  (dapagliflozin ) 10 mg daily. Refill sent to the pharmacy -Continued metformin  XR 500 mg 2 tablets BID. Explained that he can take 4 tablets in the morning to assist with adherence if needed  -Sent Rx for BG monitoring supplies to pharmacy. Patient to bring to follow up appt for education. -Lifestyle goal: decrease sugary drink intake to 4 per day from 6 per day -Patient educated on purpose, proper use, and potential adverse effects of Ozempic .  -Extensively discussed pathophysiology of diabetes, recommended lifestyle interventions, dietary effects on blood sugar control.  -Counseled on s/sx of and management of hypoglycemia.  -Next A1c anticipated 08/2024.   ASCVD risk - secondary prevention in patient with diabetes. Last LDL is 127 mg/dL, not at goal of <29 mg/dL. May need to discuss additional therapy at follow up, such as a PCSK9i.  -Continued rosuvastatin  40 mg daily. -Continued ezetimibe  10 mg daily -Continued fenofibrate  145 mg daily  Patient verbalized understanding of treatment plan. Total time patient counseling 45 minutes.  Follow-up:  Pharmacist on 06/29/24 PCP clinic visit on 06/02/24  Peyton CHARLENA Ferries, PharmD Clinical Pharmacist [facility] [phone]

## 2024-05-19 ENCOUNTER — Other Ambulatory Visit: Payer: Self-pay

## 2024-05-20 ENCOUNTER — Other Ambulatory Visit: Payer: Self-pay

## 2024-05-20 ENCOUNTER — Telehealth: Payer: Self-pay | Admitting: Family Medicine

## 2024-05-20 DIAGNOSIS — I10 Essential (primary) hypertension: Secondary | ICD-10-CM

## 2024-05-20 DIAGNOSIS — E119 Type 2 diabetes mellitus without complications: Secondary | ICD-10-CM

## 2024-05-20 DIAGNOSIS — N401 Enlarged prostate with lower urinary tract symptoms: Secondary | ICD-10-CM

## 2024-05-20 DIAGNOSIS — F418 Other specified anxiety disorders: Secondary | ICD-10-CM

## 2024-05-20 MED ORDER — VENLAFAXINE HCL ER 150 MG PO CP24
150.0000 mg | ORAL_CAPSULE | Freq: Every day | ORAL | 3 refills | Status: AC
Start: 2024-05-20 — End: ?

## 2024-05-20 MED ORDER — FENOFIBRATE 145 MG PO TABS: 145.0000 mg | ORAL_TABLET | Freq: Every day | ORAL | 3 refills | Status: AC

## 2024-05-20 MED ORDER — TAMSULOSIN HCL 0.4 MG PO CAPS: 0.4000 mg | ORAL_CAPSULE | Freq: Every day | ORAL | 3 refills | Status: AC

## 2024-05-20 MED ORDER — GLIPIZIDE ER 10 MG PO TB24: 10.0000 mg | ORAL_TABLET | Freq: Every day | ORAL | 3 refills | Status: DC

## 2024-05-20 MED ORDER — METOPROLOL TARTRATE 25 MG PO TABS: ORAL_TABLET | ORAL | 3 refills | Status: DC

## 2024-05-20 NOTE — Telephone Encounter (Signed)
 [facility] faxed refill request for the following medications:   fenofibrate  (TRICOR ) 145 MG tablet   tamsulosin  (FLOMAX ) 0.4 MG CAPS capsule   glipiZIDE  (GLUCOTROL  XL) 10 MG 24 hr tablet   venlafaxine  XR (EFFEXOR -XR) 150 MG 24 hr capsule   metoprolol  tartrate (LOPRESSOR ) 25 MG tablet     Please advise.

## 2024-05-20 NOTE — Telephone Encounter (Signed)
 Converted

## 2024-05-30 ENCOUNTER — Other Ambulatory Visit: Payer: Self-pay

## 2024-05-30 ENCOUNTER — Encounter: Payer: Self-pay | Admitting: Oncology

## 2024-06-02 ENCOUNTER — Other Ambulatory Visit: Payer: Self-pay

## 2024-06-02 ENCOUNTER — Encounter: Payer: Self-pay | Admitting: Family Medicine

## 2024-06-02 ENCOUNTER — Ambulatory Visit: Admitting: Family Medicine

## 2024-06-02 VITALS — BP 124/71 | HR 70 | Ht 72.0 in | Wt 158.2 lb

## 2024-06-02 DIAGNOSIS — Z23 Encounter for immunization: Secondary | ICD-10-CM

## 2024-06-02 DIAGNOSIS — E1159 Type 2 diabetes mellitus with other circulatory complications: Secondary | ICD-10-CM | POA: Diagnosis not present

## 2024-06-02 DIAGNOSIS — F418 Other specified anxiety disorders: Secondary | ICD-10-CM

## 2024-06-02 DIAGNOSIS — I152 Hypertension secondary to endocrine disorders: Secondary | ICD-10-CM | POA: Diagnosis not present

## 2024-06-02 DIAGNOSIS — E785 Hyperlipidemia, unspecified: Secondary | ICD-10-CM | POA: Diagnosis not present

## 2024-06-02 DIAGNOSIS — E559 Vitamin D deficiency, unspecified: Secondary | ICD-10-CM | POA: Diagnosis not present

## 2024-06-02 DIAGNOSIS — E1169 Type 2 diabetes mellitus with other specified complication: Secondary | ICD-10-CM

## 2024-06-02 DIAGNOSIS — Z79899 Other long term (current) drug therapy: Secondary | ICD-10-CM

## 2024-06-02 MED ORDER — CLONAZEPAM 0.5 MG PO TABS
0.5000 mg | ORAL_TABLET | Freq: Every day | ORAL | 2 refills | Status: AC | PRN
  Filled 2024-06-02: qty 30, 30d supply, fill #0
  Filled 2024-08-09: qty 30, 30d supply, fill #1
  Filled 2024-10-05: qty 30, 30d supply, fill #2

## 2024-06-02 NOTE — Progress Notes (Unsigned)
 Established patient visit   Patient: Joseph Terry   DOB: [birth year]   63 y.o. Male  MRN: [id number] Visit Date: 06/02/2024  Today's healthcare provider: LAURAINE LOISE BUOY, DO   Chief Complaint  Patient presents with   Follow-up   Subjective    HPI Joseph Terry Joseph Terry is a 63 year old male with diabetes and coronary artery disease who presents for a routine follow-up visit.  He has been monitoring his blood sugar levels closely and has made dietary changes to improve glucose control, which he feels have been beneficial. He encountered an issue with incorrect test strips and plans to address this at the pharmacy. He has restarted Ozempic  but has not yet taken his first dose. He is also on Farxiga  and metformin , with the latter's dosage having been doubled.  He recently retired in early August and found the transition mentally and physically challenging. Initially, he experienced significant fatigue, which was unusual for him, but now feels more rested. He plans to increase physical activity by starting to walk soon.  He is on metoprolol  tartrate, taken twice daily. He recalls being on metoprolol  succinate in the past, particularly around the time of his coronary artery bypass surgery in 2013. 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  His vitamin D  levels were low. He is not currently taking vitamin B12. He is due for an eye exam in January. He declined the flu vaccine and plans to get the COVID booster at the pharmacy. He has had one dose of the shingles vaccine and needs to complete the series. He also needs to schedule a colonoscopy, having had polyps in the past.  He takes clonazepam  at a dose of 0.5 mg daily. He is managing his prescriptions and refills, noting some confusion with the pharmacy's refill process.     {History (Optional):23778}  Medications: Outpatient Medications Prior to  Visit  Medication Sig   Blood Glucose Monitoring Suppl (BLOOD GLUCOSE MONITOR SYSTEM) w/Device KIT Use to check blood sugar up to 5 times per day. May substitute to any manufacturer covered by patient's insurance.   dapagliflozin  propanediol (FARXIGA ) 10 MG TABS tablet Take 1 tablet (10 mg total) by mouth daily.   Dupilumab  (DUPIXENT ) 300 MG/2ML SOAJ Inject 300 mg into the skin every 14 (fourteen) days.   ezetimibe  (ZETIA ) 10 MG tablet Take 1 tablet (10 mg total) by mouth daily.   fenofibrate  (TRICOR ) 145 MG tablet Take 1 tablet (145 mg total) by mouth daily.   ferrous sulfate  325 (65 FE) MG EC tablet Take 1 tablet (325 mg total) by mouth 2 (two) times daily with breakfast and lunch.   glipiZIDE  (GLUCOTROL  XL) 10 MG 24 hr tablet Take 1 tablet (10 mg total) by mouth daily with breakfast.   Glucose Blood (BLOOD GLUCOSE TEST STRIPS) STRP Use to check blood sugar up to 5 times per day. May substitute to any manufacturer covered by patient's insurance.   Lancet Device MISC Use to check blood sugar up to 5 times per day. May substitute to any manufacturer covered by patient's insurance.   Lancets (ONETOUCH ULTRASOFT) lancets Use to check blood sugar up to 5 times per day. May substitute to any manufacturer covered by patient's insurance.   metFORMIN  (GLUCOPHAGE -XR) 500 MG 24 hr tablet Take 2 tablets (1,000 mg total) by mouth 2 (two) times daily with a meal. (Patient taking differently: Take 1,000  mg by mouth daily with breakfast.)   metoprolol  tartrate (LOPRESSOR ) 25 MG tablet TAKE 1 TABLET TWICE A DAY   omeprazole  (PRILOSEC) 40 MG capsule TAKE 1 CAPSULE DAILY   rosuvastatin  (CRESTOR ) 40 MG tablet Take 1 tablet (40 mg total) by mouth daily.   Semaglutide ,0.25 or 0.5MG /DOS, (OZEMPIC , 0.25 OR 0.5 MG/DOSE,) 2 MG/3ML SOPN Inject 0.25 mg into the skin once a week.   tamsulosin  (FLOMAX ) 0.4 MG CAPS capsule Take 1 capsule (0.4 mg total) by mouth daily.   venlafaxine  XR (EFFEXOR -XR) 150 MG 24 hr capsule Take 1  capsule (150 mg total) by mouth daily with breakfast.   Vitamin D , Ergocalciferol , (DRISDOL ) 1.25 MG (50000 UNIT) CAPS capsule Take 1 capsule (50,000 Units total) by mouth every 7 (seven) days.   [DISCONTINUED] clonazePAM  (KLONOPIN ) 0.5 MG tablet Take 1 tablet (0.5 mg total) by mouth daily as needed for anxiety.   No facility-administered medications prior to visit.    {Insert previous labs (optional):23779} {See past labs  Heme  Chem  Endocrine  Serology  Results Review (optional):1}   Objective    BP 124/71   Pulse 70   Ht 6' (1.829 m)   Wt 158 lb 3 oz (71.8 kg)   SpO2 98%   BMI 21.45 kg/m  {Insert last BP/Wt (optional):23777}{See vitals history (optional):1}   Physical Exam Vitals reviewed.  Constitutional:      General: He is not in acute distress.    Appearance: Normal appearance. He is not diaphoretic.  HENT:     Head: Normocephalic and atraumatic.  Eyes:     General: No scleral icterus.    Conjunctiva/sclera: Conjunctivae normal.  Cardiovascular:     Rate and Rhythm: Normal rate and regular rhythm.     Pulses: Normal pulses.     Heart sounds: Normal heart sounds. No murmur heard. Pulmonary:     Effort: Pulmonary effort is normal. No respiratory distress.     Breath sounds: Normal breath sounds. No wheezing or rhonchi.  Musculoskeletal:     Cervical back: Neck supple.     Right lower leg: No edema.     Left lower leg: No edema.  Lymphadenopathy:     Cervical: No cervical adenopathy.  Skin:    General: Skin is warm and dry.     Findings: No rash.  Neurological:     Mental Status: He is alert and oriented to person, place, and time. Mental status is at baseline.  Psychiatric:        Mood and Affect: Mood normal.        Behavior: Behavior normal.      No results found for any visits on 06/02/24.  Assessment & Plan    Type 2 diabetes mellitus with other specified complication, without long-term current use of insulin  (HCC) -     Microalbumin /  creatinine urine ratio -     Comprehensive metabolic panel with GFR -     Hemoglobin A1c -     Vitamin B12  Hypertension associated with diabetes (HCC) -     Comprehensive metabolic panel with GFR  Hyperlipidemia associated with type 2 diabetes mellitus (HCC) -     Lipid panel  High risk medication use -     Vitamin B12  Vitamin D  deficiency -     VITAMIN D  25 Hydroxy (Vit-D Deficiency, Fractures)  Pneumococcal vaccination administered at current visit -     Pneumococcal conjugate vaccine 20-valent  Depression with anxiety -  clonazePAM ; Take 1 tablet (0.5 mg total) by mouth daily as needed for anxiety.  Dispense: 30 tablet; Refill: 2  Chronically on benzodiazepine therapy -     clonazePAM ; Take 1 tablet (0.5 mg total) by mouth daily as needed for anxiety.  Dispense: 30 tablet; Refill: 2      Type 2 diabetes mellitus with other specified complication, without long-term current use of insulin  Managed with Ozempic , Farxiga , and metformin . Reports improved glycemic control and well-being. Issue with incorrect glucose test strips to be resolved with pharmacy. - Continue Ozempic , Farxiga , and metformin  as prescribed. - Verify correct glucose test strips with pharmacy. - A1c 9.8, done 05/18/2024; order other routine blood work.  Hypertension associated with diabetes Managed with metoprolol  tartrate. Discussed potential switch to metoprolol  succinate if adherence issues arise. - Continue metoprolol  tartrate twice daily. - Consider switch to metoprolol  succinate if adherence issues arise.  Hyperlipidemia associated with diabetes Chronic, stable.  Will recheck lipid panel today.  Depression with anxiety; chronically on benzodiazepine therapy Chronic, stable. Requires prescription refill. - Refill clonazepam  prescription at [facility].  Vitamin D  deficiency Vitamin D  supplementation completed; recheck level today. - Check vitamin D  levels.    Return in about 3  months (around 09/01/2024) for Welcome to Medicare visit, Chronic f/u.      I discussed the assessment and treatment plan with the patient  The patient was provided an opportunity to ask questions and all were answered. The patient agreed with the plan and demonstrated an understanding of the instructions.   The patient was advised to call back or seek an in-person evaluation if the symptoms worsen or if the condition fails to improve as anticipated.    LAURAINE LOISE BUOY, DO  [facility] [phone] (phone) [phone] (fax)  [facility]

## 2024-06-02 NOTE — Patient Instructions (Addendum)
 Please call to schedule your colonoscopy.   Please get your 2nd shingles vaccine and your third dose of hepatitis B at your local pharmacy.

## 2024-06-03 ENCOUNTER — Ambulatory Visit: Payer: Self-pay | Admitting: Family Medicine

## 2024-06-03 LAB — LIPID PANEL
Chol/HDL Ratio: 3.8 ratio (ref 0.0–5.0)
Cholesterol, Total: 106 mg/dL (ref 100–199)
HDL: 28 mg/dL — ABNORMAL LOW (ref >39)
LDL Chol Calc (NIH): 44 mg/dL (ref 0–99)
Triglycerides: 209 mg/dL — ABNORMAL HIGH (ref 0–149)
VLDL Cholesterol Cal: 34 mg/dL (ref 5–40)

## 2024-06-03 LAB — COMPREHENSIVE METABOLIC PANEL WITH GFR
ALT: 32 IU/L (ref 0–44)
AST: 24 IU/L (ref 0–40)
Albumin: 4.7 g/dL (ref 3.9–4.9)
Alkaline Phosphatase: 58 IU/L (ref 47–123)
BUN/Creatinine Ratio: 22 (ref 10–24)
BUN: 17 mg/dL (ref 8–27)
Bilirubin Total: 0.3 mg/dL (ref 0.0–1.2)
CO2: 20 mmol/L (ref 20–29)
Calcium: 9.9 mg/dL (ref 8.6–10.2)
Chloride: 101 mmol/L (ref 96–106)
Creatinine, Ser: 0.76 mg/dL (ref 0.76–1.27)
Globulin, Total: 2.3 g/dL (ref 1.5–4.5)
Glucose: 203 mg/dL — ABNORMAL HIGH (ref 70–99)
Potassium: 4.6 mmol/L (ref 3.5–5.2)
Sodium: 139 mmol/L (ref 134–144)
Total Protein: 7 g/dL (ref 6.0–8.5)
eGFR: 100 mL/min/1.73 (ref >59)

## 2024-06-03 LAB — HEMOGLOBIN A1C
Est. average glucose Bld gHb Est-mCnc: 243 mg/dL
Hgb A1c MFr Bld: 10.1 % — ABNORMAL HIGH (ref 4.8–5.6)

## 2024-06-03 LAB — MICROALBUMIN / CREATININE URINE RATIO
Creatinine, Urine: 71.2 mg/dL
Microalb/Creat Ratio: 5 mg/g{creat} (ref 0–29)
Microalbumin, Urine: 3.8 ug/mL

## 2024-06-03 LAB — VITAMIN D 25 HYDROXY (VIT D DEFICIENCY, FRACTURES): Vit D, 25-Hydroxy: 39 ng/mL (ref 30.0–100.0)

## 2024-06-03 LAB — VITAMIN B12: Vitamin B-12: 435 pg/mL (ref 232–1245)

## 2024-06-08 ENCOUNTER — Other Ambulatory Visit: Payer: Self-pay

## 2024-06-08 ENCOUNTER — Other Ambulatory Visit: Payer: Self-pay | Admitting: Family Medicine

## 2024-06-08 DIAGNOSIS — K2270 Barrett's esophagus without dysplasia: Secondary | ICD-10-CM

## 2024-06-08 DIAGNOSIS — E1169 Type 2 diabetes mellitus with other specified complication: Secondary | ICD-10-CM

## 2024-06-08 MED ORDER — OMEPRAZOLE 40 MG PO CPDR
40.0000 mg | DELAYED_RELEASE_CAPSULE | Freq: Every day | ORAL | 3 refills | Status: AC
  Filled 2024-06-08: qty 90, 90d supply, fill #0
  Filled 2024-08-25: qty 90, 90d supply, fill #1
  Filled 2024-08-26 – 2024-09-18 (×2): qty 90, 90d supply, fill #2

## 2024-06-08 MED ORDER — EZETIMIBE 10 MG PO TABS
10.0000 mg | ORAL_TABLET | Freq: Every day | ORAL | 3 refills | Status: AC
  Filled 2024-06-08: qty 90, 90d supply, fill #0
  Filled 2024-08-25: qty 90, 90d supply, fill #1
  Filled 2024-09-10: qty 90, 90d supply, fill #2

## 2024-06-29 ENCOUNTER — Ambulatory Visit (INDEPENDENT_AMBULATORY_CARE_PROVIDER_SITE_OTHER): Payer: Self-pay

## 2024-06-29 ENCOUNTER — Other Ambulatory Visit: Payer: Self-pay

## 2024-06-29 VITALS — BP 122/70 | HR 67

## 2024-06-29 DIAGNOSIS — I152 Hypertension secondary to endocrine disorders: Secondary | ICD-10-CM | POA: Diagnosis not present

## 2024-06-29 DIAGNOSIS — E1169 Type 2 diabetes mellitus with other specified complication: Secondary | ICD-10-CM

## 2024-06-29 DIAGNOSIS — Z7984 Long term (current) use of oral hypoglycemic drugs: Secondary | ICD-10-CM | POA: Diagnosis not present

## 2024-06-29 DIAGNOSIS — E1159 Type 2 diabetes mellitus with other circulatory complications: Secondary | ICD-10-CM | POA: Diagnosis not present

## 2024-06-29 DIAGNOSIS — Z7985 Long-term (current) use of injectable non-insulin antidiabetic drugs: Secondary | ICD-10-CM | POA: Diagnosis not present

## 2024-06-29 MED ORDER — OZEMPIC (0.25 OR 0.5 MG/DOSE) 2 MG/3ML ~~LOC~~ SOPN
0.5000 mg | PEN_INJECTOR | SUBCUTANEOUS | 1 refills | Status: DC
  Filled 2024-06-29: qty 3, 28d supply, fill #0

## 2024-06-29 MED ORDER — METOPROLOL SUCCINATE ER 50 MG PO TB24
50.0000 mg | ORAL_TABLET | Freq: Every day | ORAL | 3 refills | Status: AC
  Filled 2024-06-29: qty 90, 90d supply, fill #0
  Filled 2024-08-25 – 2024-09-10 (×2): qty 90, 90d supply, fill #1
  Filled 2024-09-18: qty 90, 90d supply, fill #2

## 2024-06-29 NOTE — Patient Instructions (Addendum)
 Thanks for talking with me today!  Increase Ozempic  to 0.5 mg weekly into the skin starting July 18, 2024 Stop metoprolol  tartrate.  Start metoprolol  succinate 50 mg once daily

## 2024-06-29 NOTE — Progress Notes (Signed)
 S:     Chief Complaint  Patient presents with   Medication Management    Reason for visit: ?  Joseph Terry is a 63 y.o. male with a history of diabetes (type 2), who presents today for an initial diabetes pharmacotherapy visit.? Pertinent PMH also includes HTN, CAD s/p CABG x5, atopic dermatitis, depression, and anxiety.  Care Team: Primary Care Provider: Donzella Lauraine SAILOR, DO  At last visit with clinical pharmacist on 05/18/24, patient was restarted on Ozempic  0.25 mg weekly.   At follow up visit with PCP on 06/02/24, A1c remained elevated at 10.1%, however he had not yet restarted Ozempic .   Today he reports taking 2 doses of Ozempic  0.25 mg weekly without any concerns. He reports improved adherence with metformin  since switching to 4 tablets in the morning vs BID.  Current diabetes medications include: Farxiga  10 mg daily, glipizide  XL 10 mg daily, metformin  XR 500 mg 4 tablets with breakfast, Ozempic  0.25 mg weekly (Mondays) Previous diabetes medications include: Januvia  (cost), Ozempic  (cost) Current hypertension medications include: metoprolol  tartrate 25 mg BID Current hyperlipidemia medications include: ezetimibe  10 mg daily, rosuvastatin  40 mg daily, fenofibrate  145 mg daily   Patient endorses adherence to all medications, aside from missing the PM dose of his metoprolol .   Have you been experiencing any side effects to the medications prescribed? no Do you have any problems obtaining medications due to transportation or finances? no Insurance coverage: [employer]  Reported home random blood sugars: 160-170 mg/dL  Patient reported dietary habits: Eats 3 meals/day Breakfast: sausage cheese sandwich Lunch: tv dinner Dinner: tv dinner Snacks: chips Drinks: sugar-free sodas, 1 regular soda per day  Patient-reported exercise habits: working in the yard DM Prevention:  Statin: Taking; high intensity.?  History of albuminuria? no, last UACR on 06/02/24=  5mg /g Last eye exam: Lab Results  Component Value Date   HMDIABEYEEXA No Retinopathy 12/24/2020   HMDIABEYEEXA No Retinopathy 12/24/2020   Last foot exam: No foot exam found Tobacco Use:   Tobacco Use: Medium Risk (06/02/2024)   Patient History    Smoking Tobacco Use: Former    Smokeless Tobacco Use: Never    Passive Exposure: Not on file   O:   Vitals:  Wt Readings from Last 3 Encounters:  06/02/24 158 lb 3 oz (71.8 kg)  03/02/24 158 lb 12.8 oz (72 kg)  12/01/23 155 lb (70.3 kg)   BP Readings from Last 3 Encounters:  06/29/24 122/70  06/02/24 124/71  03/02/24 136/62   Pulse Readings from Last 3 Encounters:  06/29/24 67  06/02/24 70  03/02/24 (!) 56     Labs:?  Lab Results  Component Value Date   HGBA1C 10.1 (H) 06/02/2024   HGBA1C 9.8 (A) 05/18/2024   HGBA1C 9.4 01/28/2024   GLUCOSE 203 (H) 06/02/2024   MICRALBCREAT 5 06/02/2024   MICRALBCREAT <9 09/01/2023   MICRALBCREAT <3 08/26/2022   CREATININE 0.76 06/02/2024   CREATININE 0.75 (L) 09/01/2023   CREATININE 0.89 08/26/2022    Lab Results  Component Value Date   CHOL 106 06/02/2024   LDLCALC 44 06/02/2024   LDLCALC 102 (H) 12/01/2023   LDLCALC 127 (H) 09/01/2023   HDL 28 (L) 06/02/2024   TRIG 209 (H) 06/02/2024   TRIG 101 12/01/2023   TRIG 172 (H) 09/01/2023   ALT 32 06/02/2024   ALT 24 09/01/2023   AST 24 06/02/2024   AST 18 09/01/2023      Chemistry  Component Value Date/Time   NA 139 06/02/2024 1035   K 4.6 06/02/2024 1035   CL 101 06/02/2024 1035   CO2 20 06/02/2024 1035   BUN 17 06/02/2024 1035   CREATININE 0.76 06/02/2024 1035   CREATININE 0.76 06/18/2015 0821   GLU 89 03/03/2012 0000      Component Value Date/Time   CALCIUM  9.9 06/02/2024 1035   ALKPHOS 58 06/02/2024 1035   AST 24 06/02/2024 1035   ALT 32 06/02/2024 1035   BILITOT 0.3 06/02/2024 1035       The ASCVD Risk score (Arnett DK, et al., 2019) failed to calculate for the following reasons:   The valid total  cholesterol range is 130 to 320 mg/dL  Lab Results  Component Value Date   MICRALBCREAT 5 06/02/2024   MICRALBCREAT <9 09/01/2023   MICRALBCREAT <3 08/26/2022   MICRALBCREAT 5.3 05/15/2014    A/P: Diabetes currently uncontrolled with a most recent A1c of 10.1% on 06/02/24, which is up from 9.8% on 05/18/24. Reported home random BG readings ~160-180 mg/dL.  Medication adherence appears appropriate since moving metformin  to AM administration. Patient has taken 2 doses of Ozempic  0.25 mg weekly. Will increase dose in 2 weeks.  -Increased dose of GLP-1 Ozempic  (semaglutide ) to 0.5 mg weekly starting 07/18/24 -Continued SGLT2-I  Farxiga  (dapagliflozin ) 10 mg daily. Refill sent to the pharmacy -Continued metformin  XR 500 mg 4 tablets in the morning -Patient educated on purpose, proper use, and potential adverse effects of Ozempic .  -Extensively discussed pathophysiology of diabetes, recommended lifestyle interventions, dietary effects on blood sugar control.  -Counseled on s/sx of and management of hypoglycemia.  -Next A1c anticipated 08/2024.   Hypertension longstanding currently controlled. Blood pressure goal of <130/80 mmHg. Medication adherence suboptimal. Patient struggles with remembering PM dose of metoprolol  tartrate. Will switch to ER formulation to assist with adherence.  -Discontinued metoprolol  tartrate -Initiated metoprolol  succinate 50 mg daily  ASCVD risk - secondary prevention in patient with diabetes. Last LDL is 44 mg/dL, at goal of <44 mg/dL.  -Continued rosuvastatin  40 mg daily. -Continued ezetimibe  10 mg daily -Continued fenofibrate  145 mg daily  Written patient instructions provided. Patient verbalized understanding of treatment plan.  Total time in face to face counseling 30 minutes.     Follow-up:  Pharmacist on 06/29/24 PCP clinic visit on 06/02/24  Peyton CHARLENA Ferries, PharmD Clinical Pharmacist [facility] [phone]

## 2024-07-14 ENCOUNTER — Encounter: Payer: Self-pay | Admitting: *Deleted

## 2024-07-26 ENCOUNTER — Telehealth: Payer: Self-pay

## 2024-07-26 ENCOUNTER — Other Ambulatory Visit: Payer: Self-pay

## 2024-07-26 NOTE — Progress Notes (Signed)
   07/26/2024  Joseph Terry  DOB: 1960/11/20 MRN: [id number]  Attempted to contact patient to schedule follow up pharmacy appointment. Left HIPAA compliant message for patient to return my call at their convenience.   Stephaie Dardis E. Marsh, PharmD Clinical Pharmacist [facility] [phone]

## 2024-07-26 NOTE — Telephone Encounter (Signed)
 Pharmacy Quality Measure Review  This patient is appearing on a report for being at risk of failing the adherence measure for diabetes medications this calendar year.   Medication: Farxiga  Last fill date: 05/24/24 for 30 day supply Contacted pharmacy to facilitate refills.   Rescheduled pharmacy appointment for 08/10/24  Peyton CHARLENA Ferries, PharmD, CPP Clinical Pharmacist [facility] [phone]

## 2024-08-09 ENCOUNTER — Other Ambulatory Visit: Payer: Self-pay

## 2024-08-10 ENCOUNTER — Ambulatory Visit (INDEPENDENT_AMBULATORY_CARE_PROVIDER_SITE_OTHER)

## 2024-08-10 ENCOUNTER — Telehealth: Payer: Self-pay

## 2024-08-10 ENCOUNTER — Other Ambulatory Visit (HOSPITAL_COMMUNITY): Payer: Self-pay

## 2024-08-10 ENCOUNTER — Other Ambulatory Visit: Payer: Self-pay

## 2024-08-10 ENCOUNTER — Encounter: Payer: Self-pay | Admitting: Oncology

## 2024-08-10 ENCOUNTER — Ambulatory Visit: Admitting: Family Medicine

## 2024-08-10 DIAGNOSIS — Z7985 Long-term (current) use of injectable non-insulin antidiabetic drugs: Secondary | ICD-10-CM

## 2024-08-10 DIAGNOSIS — E1169 Type 2 diabetes mellitus with other specified complication: Secondary | ICD-10-CM | POA: Diagnosis not present

## 2024-08-10 MED ORDER — SEMAGLUTIDE (1 MG/DOSE) 4 MG/3ML ~~LOC~~ SOPN
1.0000 mg | PEN_INJECTOR | SUBCUTANEOUS | 1 refills | Status: DC
  Filled 2024-08-10 – 2024-08-25 (×2): qty 3, 28d supply, fill #0
  Filled 2024-09-10: qty 3, 28d supply, fill #1

## 2024-08-10 NOTE — Telephone Encounter (Signed)
 Pharmacy Patient Advocate Encounter  Received notification from [facility] [employer] that Prior Authorization for Dupixent  has been APPROVED from 08/10/24 to 02/07/25   PA #/Case ID/Reference #: [id number]

## 2024-08-10 NOTE — Telephone Encounter (Signed)
 Pharmacy Patient Advocate Encounter   Received notification from Onbase that prior authorization for Semaglutide , 1 MG/DOSE, 4 MG/3ML  is required/requested.   Insurance verification completed.   The patient is insured through [facility] ADVANTAGE/RX ADVANCE.   Per test claim: PA required; PA submitted to above mentioned insurance via Latent Key/confirmation #/EOC [facility] Status is pending

## 2024-08-10 NOTE — Progress Notes (Signed)
 S:     Reason for visit: ?  Joseph Terry is a 63 y.o. male with a history of diabetes (type 2), who presents today for a follow up diabetes Face to Face pharmacotherapy visit.? Pertinent PMH also includes HTN, CAD s/p CABG x5, atopic dermatitis, depression, and anxiety.   Care Team: Primary Care Provider: Donzella Lauraine SAILOR, DO  At last visit with clinical pharmacist on 06/29/24, patient was instructed to increase Ozempic  from 0.25 mg to 0.5 mg weekly. Metoprolol  was switched from tartrate to succinate to assist with adherence.   Today, he denies any SDE from Ozempic  therapy. Reports improved adherence since metoprolol  formulation was changed. He endorses recent dietary improvements. Patient reports polyuria and polydipsia have improved.  Current diabetes medications include: Farxiga  10 mg daily, glipizide  XL 10 mg daily, metformin  XR 500 mg 4 tablets with breakfast, Ozempic  0.5 mg weekly (Tuesdays) Previous diabetes medications include: Januvia  (cost) Current hypertension medications include: metoprolol  succinate 50 mg BID Current hyperlipidemia medications include: ezetimibe  10 mg daily, rosuvastatin  40 mg daily, fenofibrate  145 mg daily   Patient reports adherence to taking all medications as prescribed.   Have you been experiencing any side effects to the medications prescribed? no Do you have any problems obtaining medications due to transportation or finances? no Insurance coverage: [employer]  Patient denies hypoglycemic events.  Reported home fasting blood sugars: not checking  Patient reported dietary habits: Eats 3 meals/day Breakfast: sausage cheese sandwich Lunch: tv dinner Dinner: tv dinner Snacks: chips Drinks: sugar-free sodas, 1 regular soda per day  Patient-reported exercise habits: working in the yard DM Prevention:  Statin: Taking; high intensity.?  ACE/ARB: no;  Last urinary albumin /creatinine ratio:  Lab Results  Component Value Date    MICRALBCREAT 5 06/02/2024   MICRALBCREAT <9 09/01/2023   MICRALBCREAT <3 08/26/2022   MICRALBCREAT 5.3 05/15/2014   Last eye exam:  Lab Results  Component Value Date   HMDIABEYEEXA No Retinopathy 12/24/2020   HMDIABEYEEXA No Retinopathy 12/24/2020   Lab Results  Component Value Date   HMDIABEYEEXA No Retinopathy 12/24/2020   HMDIABEYEEXA No Retinopathy 12/24/2020   Last foot exam: No foot exam found Tobacco Use:  Tobacco Use: Medium Risk (06/02/2024)   Patient History    Smoking Tobacco Use: Former    Smokeless Tobacco Use: Never    Passive Exposure: Not on file   O:   Vitals:  Wt Readings from Last 3 Encounters:  06/02/24 158 lb 3 oz (71.8 kg)  03/02/24 158 lb 12.8 oz (72 kg)  12/01/23 155 lb (70.3 kg)   BP Readings from Last 3 Encounters:  06/29/24 122/70  06/02/24 124/71  03/02/24 136/62   Pulse Readings from Last 3 Encounters:  06/29/24 67  06/02/24 70  03/02/24 (!) 56     Labs:?  Lab Results  Component Value Date   HGBA1C 10.1 (H) 06/02/2024   HGBA1C 9.8 (A) 05/18/2024   HGBA1C 9.4 01/28/2024   GLUCOSE 203 (H) 06/02/2024   MICRALBCREAT 5 06/02/2024   MICRALBCREAT <9 09/01/2023   MICRALBCREAT <3 08/26/2022   CREATININE 0.76 06/02/2024   CREATININE 0.75 (L) 09/01/2023   CREATININE 0.89 08/26/2022    Lab Results  Component Value Date   CHOL 106 06/02/2024   LDLCALC 44 06/02/2024   LDLCALC 102 (H) 12/01/2023   LDLCALC 127 (H) 09/01/2023   HDL 28 (L) 06/02/2024   TRIG 209 (H) 06/02/2024   TRIG 101 12/01/2023   TRIG 172 (H) 09/01/2023   ALT  32 06/02/2024   ALT 24 09/01/2023   AST 24 06/02/2024   AST 18 09/01/2023      Chemistry      Component Value Date/Time   NA 139 06/02/2024 1035   K 4.6 06/02/2024 1035   CL 101 06/02/2024 1035   CO2 20 06/02/2024 1035   BUN 17 06/02/2024 1035   CREATININE 0.76 06/02/2024 1035   CREATININE 0.76 06/18/2015 0821   GLU 89 03/03/2012 0000      Component Value Date/Time   CALCIUM  9.9 06/02/2024  1035   ALKPHOS 58 06/02/2024 1035   AST 24 06/02/2024 1035   ALT 32 06/02/2024 1035   BILITOT 0.3 06/02/2024 1035       The ASCVD Risk score (Arnett DK, et al., 2019) failed to calculate for the following reasons:   The valid total cholesterol range is 130 to 320 mg/dL  Lab Results  Component Value Date   MICRALBCREAT 5 06/02/2024   MICRALBCREAT <9 09/01/2023   MICRALBCREAT <3 08/26/2022   MICRALBCREAT 5.3 05/15/2014    A/P: Diabetes currently uncontrolled with a most recent A1c of 10.1% on 06/02/24. Patient is able to verbalize appropriate hypoglycemia management plan. Medication adherence appears appropriate. Unable to fully assess control given lack of BG readings, but reported hyperglycemia symptoms have resolved (polyuria and polydipsia). Will increase Ozempic  at this time. Given duration of therapy (2014) and age, will discontinue sulfonylurea therapy at this time.  -Discontinued glipizide   -Increased dose of GLP-1 Ozempic  (semaglutide ) to 1 mg weekly -Continued SGLT2-I Farxiga  (dapagliflozin ) 10 mg daily.  -Continued metformin  XR 500 mg 4 tablets daily.  -Extensively discussed pathophysiology of diabetes, recommended lifestyle interventions, dietary effects on blood sugar control.  -Counseled on s/sx of and management of hypoglycemia.  -Next A1c anticipated 08/2024.  -Counseled to monitor FBG 2-3x per week  ASCVD risk - secondary prevention in patient with diabetes. Last LDL is 44 mg/dL, at goal of <44 mg/dL.  -Continued rosuvastatin  40 mg daily. -Continued ezetimibe  10 mg daily -Continued fenofibrate  145 mg daily  MISC: Patient reported issues with obtaining his Dupixent  from [facility] specialty pharmacy. Insurance preferred pharmacy is [facility]. Patient would like to utilize their specialty pharmacy moving forward. Will coordinate with specialty pharmacy team for enrollment in their Specialty Pharmacy program.   Patient verbalized understanding of treatment plan. Total  time patient counseling 30 minutes.  Follow-up:  Pharmacist on 09/22/24 PCP clinic visit on 09/20/24  Peyton CHARLENA Ferries, PharmD, CPP Clinical Pharmacist [facility] [phone]

## 2024-08-10 NOTE — Progress Notes (Signed)
 Patient has been filling Dupixent  with [facility] and would like to transfer to [facility]. Requested new prescription from Dr. Claudene.

## 2024-08-10 NOTE — Telephone Encounter (Signed)
 Pharmacy Patient Advocate Encounter   Received notification from Patient Pharmacy that prior authorization for Dupixent  is required/requested.   Insurance verification completed.   The patient is insured through [facility] ADVANTAGE/RX ADVANCE.   Per test claim: PA required; PA submitted to above mentioned insurance via Latent Key/confirmation #/EOC [id number] Status is pending

## 2024-08-11 ENCOUNTER — Other Ambulatory Visit: Payer: Self-pay | Admitting: Dermatology

## 2024-08-11 DIAGNOSIS — L308 Other specified dermatitis: Secondary | ICD-10-CM

## 2024-08-11 DIAGNOSIS — L578 Other skin changes due to chronic exposure to nonionizing radiation: Secondary | ICD-10-CM

## 2024-08-11 MED ORDER — DUPIXENT 300 MG/2ML ~~LOC~~ SOAJ
300.0000 mg | SUBCUTANEOUS | 11 refills | Status: AC
  Filled 2024-08-19 – 2024-08-22 (×2): qty 4, 28d supply, fill #0
  Filled 2024-09-13 – 2024-09-19 (×2): qty 4, 28d supply, fill #1

## 2024-08-12 ENCOUNTER — Other Ambulatory Visit (HOSPITAL_COMMUNITY): Payer: Self-pay

## 2024-08-12 NOTE — Telephone Encounter (Signed)
 Pharmacy Patient Advocate Encounter  Received notification from [employer] that Prior Authorization for Ozempic  (1 MG/DOSE) 4MG /3ML pen-injectors  has been APPROVED from 11.26.25 to 11.26.26. Ran test claim, Copay is $0.00. This test claim was processed through [facility]- copay amounts may vary at other pharmacies due to pharmacy/plan contracts, or as the patient moves through the different stages of their insurance plan.   PA #/Case ID/Reference #: [id number]

## 2024-08-15 ENCOUNTER — Other Ambulatory Visit: Payer: Self-pay

## 2024-08-15 NOTE — Progress Notes (Signed)
 New prescription sent in. Test claim shows $0 copay.

## 2024-08-16 ENCOUNTER — Other Ambulatory Visit: Payer: Self-pay

## 2024-08-18 NOTE — Telephone Encounter (Signed)
 Prescription now on file

## 2024-08-19 ENCOUNTER — Other Ambulatory Visit: Payer: Self-pay

## 2024-08-22 ENCOUNTER — Other Ambulatory Visit: Payer: Self-pay

## 2024-08-22 NOTE — Progress Notes (Signed)
 Specialty Pharmacy Initial Fill Coordination Note  Joseph Terry is a 63 y.o. male contacted today regarding initial fill of specialty medication(s) Dupilumab  (Dupixent )   Patient requested Delivery   Delivery date: 08/24/24   Verified address: [address]   Medication will be filled on: 08/23/24   Patient is aware of $0 copayment.

## 2024-08-22 NOTE — Progress Notes (Signed)
 Specialty Pharmacy Initiation Note   Joseph Terry is a 63 y.o. male who will be followed by the specialty pharmacy service for RxSp Atopic Dermatitis    Review of administration, indication, effectiveness, safety, potential side effects, storage/disposable, and missed dose instructions occurred today for patient's specialty medication(s) Dupilumab  (Dupixent )     Patient/Caregiver did not have any additional questions or concerns.   Patient's therapy is appropriate to: Continue (Patient previously established on therapy and is transferring pharmacies. Patient has missed several months of treatment due to issues at previous pharamacy.)    Goals Addressed             This Visit's Progress    Reduce signs and symptoms       Patient is re-initiating therapy. Patient will maintain adherence. Patient previously established on therapy and is transferring pharmacies. Patient has missed several months of treatment due to issues at previous pharamacy.          Karsen Nakanishi M Faraaz Wolin Specialty Pharmacist

## 2024-08-23 ENCOUNTER — Other Ambulatory Visit: Payer: Self-pay

## 2024-08-25 ENCOUNTER — Other Ambulatory Visit: Payer: Self-pay

## 2024-08-25 ENCOUNTER — Other Ambulatory Visit (HOSPITAL_BASED_OUTPATIENT_CLINIC_OR_DEPARTMENT_OTHER): Payer: Self-pay

## 2024-08-25 ENCOUNTER — Other Ambulatory Visit (HOSPITAL_COMMUNITY): Payer: Self-pay

## 2024-08-26 ENCOUNTER — Other Ambulatory Visit (HOSPITAL_COMMUNITY): Payer: Self-pay

## 2024-08-26 ENCOUNTER — Other Ambulatory Visit: Payer: Self-pay

## 2024-09-10 ENCOUNTER — Other Ambulatory Visit (HOSPITAL_COMMUNITY): Payer: Self-pay

## 2024-09-12 ENCOUNTER — Other Ambulatory Visit: Payer: Self-pay

## 2024-09-13 ENCOUNTER — Other Ambulatory Visit (HOSPITAL_COMMUNITY): Payer: Self-pay

## 2024-09-16 ENCOUNTER — Other Ambulatory Visit: Payer: Self-pay

## 2024-09-19 ENCOUNTER — Other Ambulatory Visit: Payer: Self-pay

## 2024-09-19 ENCOUNTER — Other Ambulatory Visit (HOSPITAL_COMMUNITY): Payer: Self-pay

## 2024-09-19 NOTE — Progress Notes (Signed)
 Called patient and discussed medicare payment plan and how to enroll. He is going to talk to provider on Thursday and contact me to discuss options further.

## 2024-09-20 ENCOUNTER — Other Ambulatory Visit: Payer: Self-pay

## 2024-09-20 ENCOUNTER — Encounter: Payer: Self-pay | Admitting: Oncology

## 2024-09-20 ENCOUNTER — Other Ambulatory Visit (HOSPITAL_COMMUNITY): Payer: Self-pay

## 2024-09-20 ENCOUNTER — Ambulatory Visit: Admitting: Family Medicine

## 2024-09-20 ENCOUNTER — Encounter: Payer: Self-pay | Admitting: Family Medicine

## 2024-09-20 VITALS — BP 111/70 | HR 74 | Ht 72.0 in | Wt 150.4 lb

## 2024-09-20 DIAGNOSIS — Z23 Encounter for immunization: Secondary | ICD-10-CM

## 2024-09-20 DIAGNOSIS — B351 Tinea unguium: Secondary | ICD-10-CM | POA: Diagnosis not present

## 2024-09-20 DIAGNOSIS — E1159 Type 2 diabetes mellitus with other circulatory complications: Secondary | ICD-10-CM

## 2024-09-20 DIAGNOSIS — Z951 Presence of aortocoronary bypass graft: Secondary | ICD-10-CM | POA: Diagnosis not present

## 2024-09-20 DIAGNOSIS — I152 Hypertension secondary to endocrine disorders: Secondary | ICD-10-CM

## 2024-09-20 DIAGNOSIS — Z7985 Long-term (current) use of injectable non-insulin antidiabetic drugs: Secondary | ICD-10-CM | POA: Diagnosis not present

## 2024-09-20 DIAGNOSIS — E785 Hyperlipidemia, unspecified: Secondary | ICD-10-CM

## 2024-09-20 DIAGNOSIS — Z7984 Long term (current) use of oral hypoglycemic drugs: Secondary | ICD-10-CM

## 2024-09-20 DIAGNOSIS — Z0001 Encounter for general adult medical examination with abnormal findings: Secondary | ICD-10-CM | POA: Diagnosis not present

## 2024-09-20 DIAGNOSIS — I4510 Unspecified right bundle-branch block: Secondary | ICD-10-CM | POA: Diagnosis not present

## 2024-09-20 DIAGNOSIS — E1169 Type 2 diabetes mellitus with other specified complication: Secondary | ICD-10-CM

## 2024-09-20 DIAGNOSIS — Z0000 Encounter for general adult medical examination without abnormal findings: Secondary | ICD-10-CM

## 2024-09-20 MED ORDER — CICLOPIROX 8 % EX SOLN
Freq: Every day | CUTANEOUS | 3 refills | Status: AC
  Filled 2024-09-20: qty 6.6, 30d supply, fill #0

## 2024-09-20 NOTE — Progress Notes (Unsigned)
 "  Chief Complaint  Patient presents with   welcome to medicare    Patient is here for a welcome to medicare visit, expresses no concerns.  Eye Exam- not yet     Subjective:   Joseph Terry is a 64 y.o. male who presents for a Welcome to Medicare Exam.    Regarding his diabetes, he reports his morning blood sugars are around 90 to 100 mg/dL, but he feels this does not align with his A1c levels. He describes his blood sugar levels as more stable since the metformin  adjustment, with fewer episodes of 'sick sugar refill'.  He has not been checking his blood pressure at home but notes it was high upon arrival today, which he attributes to stress.  He has not yet received his third hepatitis B vaccine and is due for a colonoscopy. He has not completed his eye exam yet and is also due for a shingles vaccine.  He reports issues with his toenails, noting thickening and potential infection. No pain in his calves while walking.   Visit info / Clinical Intake: Medicare Wellness Visit Type:: Welcome to [employer] (IPPE) Persons participating in visit and providing information:: patient Medicare Wellness Visit Mode:: In-person (required for WTM) Interpreter Needed?: No Pre-visit prep was completed: no AWV questionnaire completed by patient prior to visit?: no Living arrangements:: (!) lives alone Patient's Overall Health Status Rating: (!) fair Typical amount of pain: none Does pain affect daily life?: no Are you currently prescribed opioids?: no  Dietary Habits and Nutritional Risks How many meals a day?: 3 Eats fruit and vegetables daily?: yes Most meals are obtained by: preparing own meals In the last 2 weeks, have you had any of the following?: none Diabetic:: (!) yes Any non-healing wounds?: no How often do you check your BS?: 2 Would you like to be referred to a Nutritionist or for Diabetic Management? : no  Functional Status Activities of Daily Living (to include  ambulation/medication): Independent Ambulation: Independent Medication Administration: Independent Home Management (perform basic housework or laundry): Independent Manage your own finances?: yes Primary transportation is: driving Concerns about vision?: no *vision screening is required for WTM*  Fall Screening Falls in the past year?: 0 Number of falls in past year: 0 Was there an injury with Fall?: 0 Fall Risk Category Calculator: 0 Patient Fall Risk Level: Low Fall Risk  Fall Risk Patient at Risk for Falls Due to: No Fall Risks  Home and Transportation Safety: All rugs have non-skid backing?: N/A, no rugs All stairs or steps have railings?: yes Grab bars in the bathtub or shower?: (!) no Have non-skid surface in bathtub or shower?: yes Good home lighting?: yes Regular seat belt use?: yes Hospital stays in the last year:: no  Cognitive Assessment Difficulty concentrating, remembering, or making decisions? : no Will 6CIT or Mini Cog be Completed: yes What year is it?: 0 points What month is it?: 0 points Give patient an address phrase to remember (5 components): [place]leen Sharps [address] About what time is it?: 0 points Count backwards from 20 to 1: 0 points Say the months of the year in reverse: 0 points Repeat the address phrase from earlier: 0 points 6 CIT Score: 0 points  Advance Directives (For Healthcare) Does Patient Have a Medical Advance Directive?: No Would patient like information on creating a medical advance directive?: Yes (ED - Information included in AVS)  Reviewed/Updated  Reviewed/Updated: Reviewed All (Medical, Surgical, Family, Medications, Allergies, Care Teams, Patient Goals)  Allergies (verified) Tramadol    Current Medications (verified) Outpatient Encounter Medications as of 09/20/2024  Medication Sig   Blood Glucose Monitoring Suppl (BLOOD GLUCOSE MONITOR SYSTEM) w/Device KIT Use to check blood sugar up to 5 times per day. May  substitute to any manufacturer covered by patient's insurance.   ciclopirox  (PENLAC ) 8 % solution Apply topically at bedtime. Apply over affected nails and surrounding skin. Apply daily over previous coat. After seven (7) days, may remove with alcohol and continue cycle.   clonazePAM  (KLONOPIN ) 0.5 MG tablet Take 1 tablet (0.5 mg total) by mouth daily as needed for anxiety.   dapagliflozin  propanediol (FARXIGA ) 10 MG TABS tablet Take 1 tablet (10 mg total) by mouth daily.   Dupilumab  (DUPIXENT ) 300 MG/2ML SOAJ Inject 300 mg into the skin every 14 (fourteen) days.   ezetimibe  (ZETIA ) 10 MG tablet Take 1 tablet (10 mg total) by mouth daily.   fenofibrate  (TRICOR ) 145 MG tablet Take 1 tablet (145 mg total) by mouth daily.   ferrous sulfate  325 (65 FE) MG EC tablet Take 1 tablet (325 mg total) by mouth 2 (two) times daily with breakfast and lunch.   Glucose Blood (BLOOD GLUCOSE TEST STRIPS) STRP Use to check blood sugar up to 5 times per day. May substitute to any manufacturer covered by patient's insurance.   Lancet Device MISC Use to check blood sugar up to 5 times per day. May substitute to any manufacturer covered by patient's insurance.   Lancets (ONETOUCH ULTRASOFT) lancets Use to check blood sugar up to 5 times per day. May substitute to any manufacturer covered by patient's insurance.   metoprolol  succinate (TOPROL -XL) 50 MG 24 hr tablet Take 1 tablet (50 mg total) by mouth daily. Take with or immediately following a meal.   omeprazole  (PRILOSEC) 40 MG capsule Take 1 capsule (40 mg total) by mouth daily.   tamsulosin  (FLOMAX ) 0.4 MG CAPS capsule Take 1 capsule (0.4 mg total) by mouth daily.   venlafaxine  XR (EFFEXOR -XR) 150 MG 24 hr capsule Take 1 capsule (150 mg total) by mouth daily with breakfast.   [DISCONTINUED] metFORMIN  (GLUCOPHAGE -XR) 500 MG 24 hr tablet Take 2 tablets (1,000 mg total) by mouth 2 (two) times daily with a meal. (Patient taking differently: Take 1,000 mg by  mouth daily with breakfast.)   [DISCONTINUED] rosuvastatin  (CRESTOR ) 40 MG tablet Take 1 tablet (40 mg total) by mouth daily.   [DISCONTINUED] Semaglutide , 1 MG/DOSE, 4 MG/3ML SOPN Inject 1 mg as directed once a week.   [DISCONTINUED] Vitamin D , Ergocalciferol , (DRISDOL ) 1.25 MG (50000 UNIT) CAPS capsule Take 1 capsule (50,000 Units total) by mouth every 7 (seven) days.   No facility-administered encounter medications on file as of 09/20/2024.    History: Past Medical History:  Diagnosis Date   Anxiety state, unspecified    Arthritis    Barrett's esophagus    CAD (coronary artery disease), severe multivessel CAD surgical consult for CABG    a. 12/2011 Cath: 3 vessel CAD with normal EF;  b. 12/2011 CABG x 5: LIMA to LAD, SVG to distal RCA with sequential SVG to PDA, SVG to OM, and SVG to D2;  c. 03/2014 ETT: Ex time 6:21, 1mm horizontal ST dep in V3-V6, HTN response.   Depression    Diaphragmatic hernia without mention of obstruction or gangrene    Diverticulosis    GERD (gastroesophageal reflux disease)    Hyperlipidemia    Hypertension    Impotence of organic origin    Internal hemorrhoids without  mention of complication    Iron deficiency anemia due to chronic blood loss 04/15/2017   Kidney stone    Motion sickness    cars, boats   OSA on CPAP    a. Sleep study 07/2013 +sever OSA; CPAP at 10cm recommended.   Personal history of colonic polyps    Type II diabetes mellitus (HCC)    Ulcer    Wears contact lenses    Past Surgical History:  Procedure Laterality Date   CARDIAC CATHETERIZATION  4 /19/ 2013   [facility]    CHOLECYSTECTOMY  11/03/13   COLONOSCOPY  11/01/13   multiple polyps; diverticulosis. Sankar. Repeat 5 years.   COLONOSCOPY W/ POLYPECTOMY  09/16/2007   colon polyps.  Repeat 5 years. Iftikhar.   COLONOSCOPY WITH PROPOFOL  N/A 06/04/2017   Procedure: COLONOSCOPY WITH PROPOFOL ;  Surgeon: Jinny Carmine, MD;  Location: [facility];   Service: Gastroenterology;  Laterality: N/A;  needs to remain first patient stated he also suppose to get EGD also   CORONARY ARTERY BYPASS GRAFT  01/05/2012   Procedure: CORONARY ARTERY BYPASS GRAFTING (CABG);  Surgeon: Sudie VEAR Laine, MD;  Location: [facility];  Service: Open Heart Surgery;  Laterality: N/A;  coronary artery bypass graft times five using left internal mammary artery and right leg greater saphenous vein harvested endoscopically    ESOPHAGOGASTRODUODENOSCOPY N/A 06/04/2017   PT NEEDS REPEAT IN 06/2020/BARRETT'S ESOPHAGUS - Surgeon: Jinny Carmine, MD   GIVENS CAPSULE STUDY N/A 06/22/2017   Procedure: GIVENS CAPSULE STUDY;  Surgeon: Jinny Carmine, MD;  Location: [facility] END[facility];  Service: Endoscopy;  Laterality: N/A;   LEFT HEART CATHETERIZATION WITH CORONARY ANGIOGRAM N/A 01/02/2012   Procedure: LEFT HEART CATHETERIZATION WITH CORONARY ANGIOGRAM;  Surgeon: Debby DELENA Sor, MD;  Location: [facility] C[facility] LAB;  Service: Cardiovascular;  Laterality: N/A;   POLYPECTOMY N/A 06/04/2017   Procedure: POLYPECTOMY;  Surgeon: Jinny Carmine, MD;  Location: [facility];  Service: Gastroenterology;  Laterality: N/A;   POLYSOMNOGRAPHY  07/2013   +severe OSA; CPAP at 10cm pressure recommended.   rotator cuff curgery  2011   lt rotator cuff   Shoulder surgery  1997   bilateral   WISDOM TOOTH EXTRACTION     Family History  Problem Relation Age of Onset   Heart attack Mother        defribillator in place   Hypertension Mother    Hyperlipidemia Mother    Heart disease Mother        CHF with defibrillator;  CAD.   Diabetes Mother    Cancer Mother 29       Lung cancer   Hypertension Father    Hyperlipidemia Father    Cancer Father 10        prostate cancer s/p  implants   COPD Father    Hypertension Sister    Heart disease Maternal Grandmother    Heart disease Maternal Grandfather    Cancer Paternal Grandmother    Cancer Paternal Grandfather    Hypertension Sister     Social History   Occupational History   Occupation: Doctor, Hospital: [employer]    Comment: x 30 yrs  Tobacco Use   Smoking status: Former    Current packs/day: 0.00    Average packs/day: 1.5 packs/day for 35.0 years (52.5 ttl pk-yrs)    Types: Cigarettes    Start date: 11/13/1976    Quit date: 11/14/2011    Years since quitting: 12.8   Smokeless tobacco: Never  Tobacco comments:    Quit 2015  Vaping Use   Vaping status: Never Used  Substance and Sexual Activity   Alcohol use: Not Currently    Alcohol/week: 2.0 standard drinks of alcohol    Types: 2 Cans of beer per week    Comment: Drinks once per month; beer.   Drug use: Never   Sexual activity: Not Currently    Partners: Female    Birth control/protection: Condom, None   Tobacco Counseling Counseling given: Not Answered Tobacco comments: Quit 2015  SDOH Screenings   Food Insecurity: Unknown (06/28/2024)  Housing: Low Risk (06/28/2024)  Transportation Needs: No Transportation Needs (06/28/2024)  Utilities: Not At Risk (01/28/2024)   Received from [facility]  Alcohol Screen: Low Risk (06/28/2024)  Depression (PHQ2-9): Medium Risk (09/20/2024)  Financial Resource Strain: Low Risk (06/28/2024)  Physical Activity: Unknown (02/27/2024)  Social Connections: Socially Isolated (06/28/2024)  Stress: Stress Concern Present (02/27/2024)  Tobacco Use: Medium Risk (09/20/2024)   See flowsheets for full screening details  Depression Screen PHQ 2 & 9 Depression Scale- Over the past 2 weeks, how often have you been bothered by any of the following problems? Little interest or pleasure in doing things: 1 Feeling down, depressed, or hopeless (PHQ Adolescent also includes...irritable): 1 PHQ-2 Total Score: 2 Trouble falling or staying asleep, or sleeping too much: 1 Feeling tired or having little energy: 1 Poor appetite or overeating (PHQ Adolescent also includes...weight loss): 1 Feeling bad  about yourself - or that you are a failure or have let yourself or your family down: 1 Trouble concentrating on things, such as reading the newspaper or watching television (PHQ Adolescent also includes...like school work): 1 Moving or speaking so slowly that other people could have noticed. Or the opposite - being so fidgety or restless that you have been moving around a lot more than usual: 0 Thoughts that you would be better off dead, or of hurting yourself in some way: 0 PHQ-9 Total Score: 7 If you checked off any problems, how difficult have these problems made it for you to do your work, take care of things at home, or get along with other people?: Not difficult at all      Goals Addressed   None          Objective:    Today's Vitals   09/20/24 1043 09/20/24 1108  BP: (!) 145/84 111/70  Pulse: 78 74  SpO2: 98%   Weight: 150 lb 6.4 oz (68.2 kg)   Height: 6' (1.829 m)    Body mass index is 20.4 kg/m.   Physical Exam Vitals and nursing note reviewed.  Constitutional:      General: He is awake.     Appearance: Normal appearance.  HENT:     Head: Normocephalic and atraumatic.     Right Ear: Tympanic membrane, ear canal and external ear normal.     Left Ear: Tympanic membrane, ear canal and external ear normal.     Nose: Nose normal.     Mouth/Throat:     Mouth: Mucous membranes are moist.     Pharynx: Oropharynx is clear. No oropharyngeal exudate or posterior oropharyngeal erythema.  Eyes:     General: No scleral icterus.    Extraocular Movements: Extraocular movements intact.     Conjunctiva/sclera: Conjunctivae normal.     Pupils: Pupils are equal, round, and reactive to light.  Neck:     Thyroid: No thyromegaly or thyroid tenderness.  Cardiovascular:  Rate and Rhythm: Normal rate and regular rhythm.     Pulses: Normal pulses.     Heart sounds: Normal heart sounds.  Pulmonary:     Effort: Pulmonary effort is normal. No tachypnea, bradypnea or respiratory  distress.     Breath sounds: Normal breath sounds. No stridor. No wheezing, rhonchi or rales.  Abdominal:     General: Bowel sounds are normal. There is no distension.     Palpations: Abdomen is soft. There is no mass.     Tenderness: There is no abdominal tenderness. There is no guarding.     Hernia: No hernia is present.  Musculoskeletal:     Cervical back: Normal range of motion and neck supple.     Right lower leg: No edema.     Left lower leg: No edema.  Lymphadenopathy:     Cervical: No cervical adenopathy.  Skin:    General: Skin is warm and dry.  Neurological:     Mental Status: He is alert and oriented to person, place, and time. Mental status is at baseline.  Psychiatric:        Mood and Affect: Mood normal.        Behavior: Behavior normal.   {(optional), or other factors deemed appropriate based on the beneficiary's medical and social history and current clinical standards. - TEXT BETWEEN BRACKETS WILL DISAPPEAR WHEN YOU SIGN THE ENCOUNTER:1}   Hearing/Vision screen No results found. Immunizations and Health Maintenance Health Maintenance  Topic Date Due   OPHTHALMOLOGY EXAM  12/24/2021   Zoster Vaccines- Shingrix  (2 of 2) 10/04/2024 (Originally 01/26/2024)   Influenza Vaccine  12/13/2024 (Originally 04/15/2024)   Lung Cancer Screening  03/02/2025 (Originally 06/12/2009)   COVID-19 Vaccine (6 - 2025-26 season) 05/30/2025 (Originally 05/16/2024)   Colonoscopy  09/20/2025 (Originally 06/04/2022)   HEMOGLOBIN A1C  12/19/2024   Diabetic kidney evaluation - eGFR measurement  06/02/2025   Diabetic kidney evaluation - Urine ACR  06/02/2025   FOOT EXAM  09/20/2025   Medicare Annual Wellness (AWV)  09/20/2025   DTaP/Tdap/Td (4 - Td or Tdap) 04/03/2029   Pneumococcal Vaccine: 50+ Years  Completed   Hepatitis B Vaccines 19-59 Average Risk  Completed   Hepatitis C Screening  Completed   HIV Screening  Completed   Meningococcal B Vaccine  Aged Out    EKG: normal  EKG, normal sinus rhythm, RBBB (new from previous EKG in 2016 but previously noted on EKG on 03/09/2012)     Assessment/Plan:  This is a routine wellness examination for Lynn.   Welcome to Medicare preventive visit -     EKG 12-Lead  Type 2 diabetes mellitus with other specified complication, without long-term current use of insulin  (HCC) -     Hemoglobin A1c  Hypertension associated with diabetes (HCC) -     EKG 12-Lead  Hyperlipidemia associated with type 2 diabetes mellitus (HCC)  S/P CABG x 5  Hepatitis B vaccination administered at current visit -     Heplisav-B  (HepB-CPG) Vaccine  Onychomycosis of multiple toenails with type 2 diabetes mellitus (HCC) -     Ciclopirox ; Apply topically at bedtime. Apply over affected nails and surrounding skin. Apply daily over previous coat. After seven (7) days, may remove with alcohol and continue cycle.  Dispense: 6.6 mL; Refill: 3    Welcome to Medicare preventative visit Routine wellness visit with initial elevated blood pressure, normalized upon recheck. Physical exam overall unremarkable except as noted above. Routine lab work up-to-date except for A1c which  will be ordered today.  Due for hepatitis B and shingles vaccines. Colonoscopy and eye exam pending. - Ordered A1c test. - Referred for colonoscopy. - Advised to complete eye exam. - Advised to obtain shingles vaccine at pharmacy. - EKG ordered as noted above, remarkable for sinus rhythm with right bundle branch block.  Type 2 diabetes mellitus with other specified complications, without long-term current use of insulin  Type 2 diabetes managed with metformin , Farxiga , and Ozempic . Blood sugars well-controlled. A1c to be checked for long-term control. - Continue metformin , Farxiga , and Ozempic . - Ordered A1c test.  Hypertension associated with diabetes; s/p CABG Chronic, well-managed on metoprolol  succinate 50 mg daily.  No changes today.  Hyperlipidemia associated with  type 2 diabetes mellitus; s/p CABG Chronic, managed with ezetimibe  10 mg daily and rosuvastatin  40 mg daily.  No changes today.  Onychomycosis of multiple toenails with type 2 diabetes mellitus Onychomycosis with thickening, yellowing and suspected infection. Discussed treatment options including topical and oral antifungals. - Prescribed topical antifungal treatment. - Advised to monitor response to treatment and consider over-the-counter options if needed.    Patient Care Team: Donzella Lauraine SAILOR, DO as PCP - General (Family Medicine) Maye Ade, MD (Inactive) as Consulting Physician (Cardiology) Claudene Rayfield HERO, MD as Consulting Physician (Family Medicine) Dellie Louanne MATSU, MD (General Surgery) Gaylord Juliene Lenis, MD (Oral Surgery) Lynell Anes, OD (Optometry)  I have personally reviewed and noted the following in the patients chart:   Medical and social history Use of alcohol, tobacco or illicit drugs  Current medications and supplements including opioid prescriptions. Functional ability and status Nutritional status Physical activity Advanced directives List of other physicians Hospitalizations, surgeries, and ER visits in previous 12 months Vitals Screenings to include cognitive, depression, and falls Referrals and appointments Patient is not currently on any opioid medications.  Orders Placed This Encounter  Procedures   Heplisav-B  (HepB-CPG) Vaccine   Hemoglobin A1c   EKG 12-Lead   In addition, I have reviewed and discussed with patient certain preventive protocols, quality metrics, and best practice recommendations. A written personalized care plan for preventive services as well as general preventive health recommendations were provided to patient.   Micheal Sheen N Kellen Dutch, DO   09/22/2024   No follow-ups on file.  "

## 2024-09-20 NOTE — Patient Instructions (Signed)
 Recommended vaccines to get at pharmacy: Shingrix  (for shingles - a two dose series)

## 2024-09-21 ENCOUNTER — Other Ambulatory Visit (HOSPITAL_COMMUNITY): Payer: Self-pay

## 2024-09-21 ENCOUNTER — Telehealth (HOSPITAL_COMMUNITY): Payer: Self-pay

## 2024-09-21 ENCOUNTER — Other Ambulatory Visit: Payer: Self-pay

## 2024-09-21 LAB — HEMOGLOBIN A1C
Est. average glucose Bld gHb Est-mCnc: 186 mg/dL
Hgb A1c MFr Bld: 8.1 % — ABNORMAL HIGH (ref 4.8–5.6)

## 2024-09-21 NOTE — Telephone Encounter (Signed)
 To proceed with the prior authorization for Ciclopirox  8% solution , we will need to attach chart notes from the most recent office visit. Once the notes are signed, we can submit the PA

## 2024-09-21 NOTE — Telephone Encounter (Signed)
"  error  "

## 2024-09-22 ENCOUNTER — Other Ambulatory Visit: Payer: Self-pay

## 2024-09-22 ENCOUNTER — Telehealth: Payer: Self-pay

## 2024-09-22 ENCOUNTER — Ambulatory Visit

## 2024-09-22 DIAGNOSIS — E785 Hyperlipidemia, unspecified: Secondary | ICD-10-CM | POA: Diagnosis not present

## 2024-09-22 DIAGNOSIS — Z7984 Long term (current) use of oral hypoglycemic drugs: Secondary | ICD-10-CM

## 2024-09-22 DIAGNOSIS — Z7985 Long-term (current) use of injectable non-insulin antidiabetic drugs: Secondary | ICD-10-CM

## 2024-09-22 DIAGNOSIS — E1169 Type 2 diabetes mellitus with other specified complication: Secondary | ICD-10-CM

## 2024-09-22 MED ORDER — ROSUVASTATIN CALCIUM 40 MG PO TABS
40.0000 mg | ORAL_TABLET | Freq: Every day | ORAL | 3 refills | Status: AC
  Filled 2024-09-22: qty 90, 90d supply, fill #0

## 2024-09-22 MED ORDER — METFORMIN HCL ER 500 MG PO TB24
1000.0000 mg | ORAL_TABLET | Freq: Two times a day (BID) | ORAL | 2 refills | Status: AC
  Filled 2024-09-22: qty 360, 90d supply, fill #0

## 2024-09-22 MED ORDER — SEMAGLUTIDE (2 MG/DOSE) 8 MG/3ML ~~LOC~~ SOPN
2.0000 mg | PEN_INJECTOR | SUBCUTANEOUS | 1 refills | Status: AC
  Filled 2024-09-22: qty 9, 84d supply, fill #0

## 2024-09-22 NOTE — Progress Notes (Addendum)
 Patient would like to apply for manufacture assistance. Completed form and faxed to provider for signature. Instructed office to fax back to me and I can send form to patient for his signature, or they can have patient come into the office and sign then fax to program.

## 2024-09-22 NOTE — Telephone Encounter (Signed)
 Error

## 2024-09-22 NOTE — Progress Notes (Signed)
 "  S:     Reason for visit: ?  Joseph Terry is a 64 y.o. male with a history of diabetes (type 2), who presents today for a follow up diabetes Face to Face pharmacotherapy visit.? Pertinent PMH also includes HTN, CAD s/p CABG x5, atopic dermatitis, depression, and anxiety.   Care Team: Primary Care Provider: Donzella Lauraine SAILOR, DO  At last visit with clinical pharmacist on 08/10/24, patient was instructed to discontinue glipizide  and increase Ozempic  to 1 mg weekly   Today, he denies any SDE from Ozempic  therapy. Reports improved adherence since metoprolol  formulation was changed. He endorses recent dietary improvements. Patient reports polyuria and polydipsia have resolved. He reports he recently picked up a month supply on Ozempic  therapy.   Current diabetes medications include: Farxiga  10 mg daily, metformin  XR 500 mg 4 tablets with breakfast, Ozempic  1 mg weekly  Previous diabetes medications include: Januvia  (cost) Current hypertension medications include: metoprolol  succinate 50 mg BID Current hyperlipidemia medications include: ezetimibe  10 mg daily, rosuvastatin  40 mg daily, fenofibrate  145 mg daily   Patient reports adherence to taking all medications as prescribed.   Have you been experiencing any side effects to the medications prescribed? no Do you have any problems obtaining medications due to transportation or finances? no Insurance coverage: [employer]  Patient denies hypoglycemic events.  Reported home fasting blood sugars: ~100 mg/dL (did not bring log to clinic)  Patient reported dietary habits: Eats 3 meals/day Breakfast: sausage cheese sandwich Lunch: tv dinner Dinner: tv dinner Snacks: chips Drinks: sugar-free sodas, 1 regular soda per day  Patient-reported exercise habits: working in the yard DM Prevention:  Statin: Taking; high intensity.?  ACE/ARB: no;  Last urinary albumin /creatinine ratio:  Lab Results  Component Value Date    MICRALBCREAT 5 06/02/2024   MICRALBCREAT <9 09/01/2023   MICRALBCREAT <3 08/26/2022   MICRALBCREAT 5.3 05/15/2014   Last eye exam:  Lab Results  Component Value Date   HMDIABEYEEXA No Retinopathy 12/24/2020   HMDIABEYEEXA No Retinopathy 12/24/2020   Lab Results  Component Value Date   HMDIABEYEEXA No Retinopathy 12/24/2020   HMDIABEYEEXA No Retinopathy 12/24/2020   Last foot exam: 09/20/2024 Tobacco Use:  Tobacco Use: Medium Risk (09/20/2024)   Patient History    Smoking Tobacco Use: Former    Smokeless Tobacco Use: Never    Passive Exposure: Not on file   O:   Vitals:  Wt Readings from Last 3 Encounters:  09/20/24 150 lb 6.4 oz (68.2 kg)  06/02/24 158 lb 3 oz (71.8 kg)  03/02/24 158 lb 12.8 oz (72 kg)   BP Readings from Last 3 Encounters:  09/20/24 111/70  06/29/24 122/70  06/02/24 124/71   Pulse Readings from Last 3 Encounters:  09/20/24 74  06/29/24 67  06/02/24 70     Labs:?  Lab Results  Component Value Date   HGBA1C 8.1 (H) 09/20/2024   HGBA1C 10.1 (H) 06/02/2024   HGBA1C 9.8 (A) 05/18/2024   GLUCOSE 203 (H) 06/02/2024   MICRALBCREAT 5 06/02/2024   MICRALBCREAT <9 09/01/2023   MICRALBCREAT <3 08/26/2022   CREATININE 0.76 06/02/2024   CREATININE 0.75 (L) 09/01/2023   CREATININE 0.89 08/26/2022    Lab Results  Component Value Date   CHOL 106 06/02/2024   LDLCALC 44 06/02/2024   LDLCALC 102 (H) 12/01/2023   LDLCALC 127 (H) 09/01/2023   HDL 28 (L) 06/02/2024   TRIG 209 (H) 06/02/2024   TRIG 101 12/01/2023   TRIG 172 (H) 09/01/2023  ALT 32 06/02/2024   ALT 24 09/01/2023   AST 24 06/02/2024   AST 18 09/01/2023      Chemistry      Component Value Date/Time   NA 139 06/02/2024 1035   K 4.6 06/02/2024 1035   CL 101 06/02/2024 1035   CO2 20 06/02/2024 1035   BUN 17 06/02/2024 1035   CREATININE 0.76 06/02/2024 1035   CREATININE 0.76 06/18/2015 0821   GLU 89 03/03/2012 0000      Component Value Date/Time   CALCIUM  9.9 06/02/2024 1035    ALKPHOS 58 06/02/2024 1035   AST 24 06/02/2024 1035   ALT 32 06/02/2024 1035   BILITOT 0.3 06/02/2024 1035       The ASCVD Risk score (Arnett DK, et al., 2019) failed to calculate for the following reasons:   The valid total cholesterol range is 130 to 320 mg/dL  Lab Results  Component Value Date   MICRALBCREAT 5 06/02/2024   MICRALBCREAT <9 09/01/2023   MICRALBCREAT <3 08/26/2022   MICRALBCREAT 5.3 05/15/2014    A/P: Diabetes currently uncontrolled with a most recent A1c of 8.1% on 09/20/24, which is down from 10.1% on 06/02/24. However, reported FBG is at goal. Patient is able to verbalize appropriate hypoglycemia management plan. Medication adherence appears appropriate. Patient just picked up a new box of Ozempic  1 mg weekly; will send in higher dose to start upon completion of current home stock -Increased dose of GLP-1 Ozempic  (semaglutide ) to 2 mg weekly starting in a month -Continued SGLT2-I Farxiga  (dapagliflozin ) 10 mg daily.  -Continued metformin  XR 500 mg 4 tablets daily.  -Extensively discussed pathophysiology of diabetes, recommended lifestyle interventions, dietary effects on blood sugar control.  -Counseled on s/sx of and management of hypoglycemia.  -Next A1c anticipated 08/2024.  -Continue monitoring FBG 2-3x per week and present with log to clinic  ASCVD risk - secondary prevention in patient with diabetes. Last LDL is 44 mg/dL, at goal of <44 mg/dL.  -Continued rosuvastatin  40 mg daily. -Continued ezetimibe  10 mg daily -Continued fenofibrate  145 mg daily   MISC: patient reports his Dupixent  is expensive at the pharmacy. It appears he may have been historically enrolled in [employer]. Will coordinate with specialty team to see if they can assist.   Patient verbalized understanding of treatment plan. Total time patient counseling 15 minutes.  Follow-up:  Pharmacist on 11/23/24  Peyton CHARLENA Ferries, PharmD, CPP Clinical Pharmacist [facility] [phone]   "

## 2024-09-27 NOTE — Progress Notes (Signed)
 Provider has signed Dupixent  myway program application. Patient will need to sign and send back to me so I can fax to program.   Application sent to patient via mychart message.

## 2024-09-28 NOTE — Telephone Encounter (Signed)
 Pharmacy Patient Advocate Encounter   Received notification from Pt Calls Messages that prior authorization for Ciclopirox  8% solution  is required/requested.   Insurance verification completed.   The patient is insured through [employer] [facility] ADVANTAGE/RX ADVANCE.   Per test claim: PA required; PA submitted to above mentioned insurance via Latent Key/confirmation #/EOC [id number] Status is pending

## 2024-10-05 ENCOUNTER — Other Ambulatory Visit: Payer: Self-pay

## 2024-10-05 NOTE — Telephone Encounter (Signed)
 Pharmacy Patient Advocate Encounter  Received notification from [facility] ADVANTAGE/RX ADVANCE that Prior Authorization for Ciclopirox  8% solution  has been DENIED.  Full denial letter will be uploaded to the media tab. See denial reason below.   PA #/Case ID/Reference #: [id number]

## 2024-10-11 ENCOUNTER — Other Ambulatory Visit: Payer: Self-pay

## 2024-10-14 ENCOUNTER — Other Ambulatory Visit: Payer: Self-pay

## 2024-10-16 ENCOUNTER — Other Ambulatory Visit (HOSPITAL_COMMUNITY): Payer: Self-pay

## 2024-10-18 ENCOUNTER — Other Ambulatory Visit: Payer: Self-pay

## 2024-11-23 ENCOUNTER — Ambulatory Visit

## 2025-03-28 ENCOUNTER — Ambulatory Visit: Admitting: Dermatology
